# Patient Record
Sex: Female | Born: 1940 | Race: White | Hispanic: No | Marital: Single | Smoking: Current every day smoker
Health system: Southern US, Community
[De-identification: ages and names within clinical notes are randomized; demographics above are authoritative.]

## PROBLEM LIST (undated history)

## (undated) DIAGNOSIS — J189 Pneumonia, unspecified organism: Secondary | ICD-10-CM

## (undated) DIAGNOSIS — D649 Anemia, unspecified: Secondary | ICD-10-CM

## (undated) DIAGNOSIS — E871 Hypo-osmolality and hyponatremia: Secondary | ICD-10-CM

## (undated) DIAGNOSIS — I509 Heart failure, unspecified: Secondary | ICD-10-CM

## (undated) DIAGNOSIS — Z87442 Personal history of urinary calculi: Secondary | ICD-10-CM

## (undated) DIAGNOSIS — I4891 Unspecified atrial fibrillation: Secondary | ICD-10-CM

## (undated) DIAGNOSIS — E876 Hypokalemia: Secondary | ICD-10-CM

## (undated) DIAGNOSIS — J9600 Acute respiratory failure, unspecified whether with hypoxia or hypercapnia: Secondary | ICD-10-CM

## (undated) DIAGNOSIS — IMO0001 Reserved for inherently not codable concepts without codable children: Secondary | ICD-10-CM

## (undated) DIAGNOSIS — I10 Essential (primary) hypertension: Secondary | ICD-10-CM

## (undated) DIAGNOSIS — J441 Chronic obstructive pulmonary disease with (acute) exacerbation: Secondary | ICD-10-CM

## (undated) HISTORY — DX: Acute respiratory failure, unspecified whether with hypoxia or hypercapnia: J96.00

## (undated) HISTORY — PX: CHOLECYSTECTOMY: SHX55

## (undated) HISTORY — PX: ABDOMINAL HYSTERECTOMY: SHX81

## (undated) HISTORY — DX: Hypo-osmolality and hyponatremia: E87.1

## (undated) HISTORY — DX: Anemia, unspecified: D64.9

## (undated) HISTORY — PX: CYST EXCISION: SHX5701

## (undated) HISTORY — DX: Pneumonia, unspecified organism: J18.9

## (undated) HISTORY — PX: BACK SURGERY: SHX140

## (undated) HISTORY — DX: Chronic obstructive pulmonary disease with (acute) exacerbation: J44.1

## (undated) HISTORY — DX: Hypokalemia: E87.6

---

## 2014-02-02 ENCOUNTER — Telehealth: Payer: Self-pay | Admitting: Critical Care Medicine

## 2014-02-02 NOTE — Telephone Encounter (Signed)
ATC Annette @ [facility]. No one at [facility] is aware of an Drinda Buttsnnette that works there and the only way to reach "scheduling dept" is through an automated system which requires you to choose from a list of options... MRI, CT, Xray, Mammography etc... Nothing stating general scheduling dept. I selected option 1 (MRI) to try and speak with someone--was transferred by MRI to Amanda's extension in scheduling. Left detailed message pertaining to why I am calling.  Asked that she get the message to the appropriate person/location or contact our office back.  Number was given on automated system at Amanda's extension---- [phone] General Scheduling, will try this number.  Number given directed me to the same list of options as above.  Will await call back from [facility].

## 2014-02-03 NOTE — Telephone Encounter (Signed)
We received d[facility]c summary at the [facility] on this pt from [facility]. [place] Carm[place], spoke with pt.  She is requesting I speak with her son, Leonette MonarchCarol Plumb, to schedule appt.  She placed him on the phone.   We have scheduled pt to see PW on Tuesday, May 26 at 9:15 am in [place].  Pt to arrive at 9 am and bring all meds with her to visit.  Son aware, was given [id number] # and [place] office location, and voiced no further questions or concerns at this time.

## 2014-02-06 ENCOUNTER — Encounter: Payer: Self-pay | Admitting: Critical Care Medicine

## 2014-02-10 ENCOUNTER — Inpatient Hospital Stay: Payer: Self-pay | Admitting: Critical Care Medicine

## 2014-09-18 DIAGNOSIS — J449 Chronic obstructive pulmonary disease, unspecified: Secondary | ICD-10-CM | POA: Diagnosis not present

## 2014-10-19 DIAGNOSIS — J449 Chronic obstructive pulmonary disease, unspecified: Secondary | ICD-10-CM | POA: Diagnosis not present

## 2014-10-21 DIAGNOSIS — I10 Essential (primary) hypertension: Secondary | ICD-10-CM | POA: Diagnosis not present

## 2014-10-21 DIAGNOSIS — J449 Chronic obstructive pulmonary disease, unspecified: Secondary | ICD-10-CM | POA: Diagnosis not present

## 2014-10-21 DIAGNOSIS — K219 Gastro-esophageal reflux disease without esophagitis: Secondary | ICD-10-CM | POA: Diagnosis not present

## 2014-10-21 DIAGNOSIS — Z90710 Acquired absence of both cervix and uterus: Secondary | ICD-10-CM | POA: Diagnosis not present

## 2014-10-21 DIAGNOSIS — J9691 Respiratory failure, unspecified with hypoxia: Secondary | ICD-10-CM | POA: Diagnosis not present

## 2014-10-21 DIAGNOSIS — F17200 Nicotine dependence, unspecified, uncomplicated: Secondary | ICD-10-CM | POA: Diagnosis not present

## 2014-10-21 DIAGNOSIS — E780 Pure hypercholesterolemia: Secondary | ICD-10-CM | POA: Diagnosis not present

## 2014-10-21 DIAGNOSIS — J90 Pleural effusion, not elsewhere classified: Secondary | ICD-10-CM | POA: Diagnosis not present

## 2014-10-21 DIAGNOSIS — Z6820 Body mass index (BMI) 20.0-20.9, adult: Secondary | ICD-10-CM | POA: Diagnosis not present

## 2014-10-21 DIAGNOSIS — M1990 Unspecified osteoarthritis, unspecified site: Secondary | ICD-10-CM | POA: Diagnosis not present

## 2014-10-21 DIAGNOSIS — A419 Sepsis, unspecified organism: Secondary | ICD-10-CM | POA: Diagnosis not present

## 2014-10-21 DIAGNOSIS — Z716 Tobacco abuse counseling: Secondary | ICD-10-CM | POA: Diagnosis not present

## 2014-10-21 DIAGNOSIS — J111 Influenza due to unidentified influenza virus with other respiratory manifestations: Secondary | ICD-10-CM | POA: Diagnosis not present

## 2014-10-21 DIAGNOSIS — R531 Weakness: Secondary | ICD-10-CM | POA: Diagnosis not present

## 2014-10-21 DIAGNOSIS — E1165 Type 2 diabetes mellitus with hyperglycemia: Secondary | ICD-10-CM | POA: Diagnosis not present

## 2014-10-21 DIAGNOSIS — Z9889 Other specified postprocedural states: Secondary | ICD-10-CM | POA: Diagnosis not present

## 2014-10-21 DIAGNOSIS — J9601 Acute respiratory failure with hypoxia: Secondary | ICD-10-CM | POA: Diagnosis not present

## 2014-10-21 DIAGNOSIS — I509 Heart failure, unspecified: Secondary | ICD-10-CM | POA: Diagnosis not present

## 2014-10-21 DIAGNOSIS — J45909 Unspecified asthma, uncomplicated: Secondary | ICD-10-CM | POA: Diagnosis not present

## 2014-10-21 DIAGNOSIS — R9431 Abnormal electrocardiogram [ECG] [EKG]: Secondary | ICD-10-CM | POA: Diagnosis not present

## 2014-10-21 DIAGNOSIS — Z9049 Acquired absence of other specified parts of digestive tract: Secondary | ICD-10-CM | POA: Diagnosis not present

## 2014-10-21 DIAGNOSIS — J189 Pneumonia, unspecified organism: Secondary | ICD-10-CM | POA: Diagnosis not present

## 2014-11-04 DIAGNOSIS — Z9981 Dependence on supplemental oxygen: Secondary | ICD-10-CM | POA: Diagnosis not present

## 2014-11-04 DIAGNOSIS — J111 Influenza due to unidentified influenza virus with other respiratory manifestations: Secondary | ICD-10-CM | POA: Diagnosis not present

## 2014-11-04 DIAGNOSIS — J449 Chronic obstructive pulmonary disease, unspecified: Secondary | ICD-10-CM | POA: Diagnosis not present

## 2014-11-04 DIAGNOSIS — J189 Pneumonia, unspecified organism: Secondary | ICD-10-CM | POA: Diagnosis not present

## 2014-11-04 DIAGNOSIS — F17200 Nicotine dependence, unspecified, uncomplicated: Secondary | ICD-10-CM | POA: Diagnosis not present

## 2014-11-17 DIAGNOSIS — J449 Chronic obstructive pulmonary disease, unspecified: Secondary | ICD-10-CM | POA: Diagnosis not present

## 2014-12-16 DIAGNOSIS — L03116 Cellulitis of left lower limb: Secondary | ICD-10-CM | POA: Diagnosis not present

## 2014-12-16 DIAGNOSIS — M79673 Pain in unspecified foot: Secondary | ICD-10-CM | POA: Diagnosis not present

## 2014-12-16 DIAGNOSIS — J302 Other seasonal allergic rhinitis: Secondary | ICD-10-CM | POA: Diagnosis not present

## 2014-12-16 DIAGNOSIS — M7989 Other specified soft tissue disorders: Secondary | ICD-10-CM | POA: Diagnosis not present

## 2014-12-16 DIAGNOSIS — Z6822 Body mass index (BMI) 22.0-22.9, adult: Secondary | ICD-10-CM | POA: Diagnosis not present

## 2014-12-18 DIAGNOSIS — J449 Chronic obstructive pulmonary disease, unspecified: Secondary | ICD-10-CM | POA: Diagnosis not present

## 2014-12-23 DIAGNOSIS — E11621 Type 2 diabetes mellitus with foot ulcer: Secondary | ICD-10-CM | POA: Diagnosis not present

## 2014-12-23 DIAGNOSIS — L97509 Non-pressure chronic ulcer of other part of unspecified foot with unspecified severity: Secondary | ICD-10-CM | POA: Diagnosis not present

## 2014-12-23 DIAGNOSIS — E1165 Type 2 diabetes mellitus with hyperglycemia: Secondary | ICD-10-CM | POA: Diagnosis not present

## 2014-12-23 DIAGNOSIS — L03116 Cellulitis of left lower limb: Secondary | ICD-10-CM | POA: Diagnosis not present

## 2014-12-24 DIAGNOSIS — L97521 Non-pressure chronic ulcer of other part of left foot limited to breakdown of skin: Secondary | ICD-10-CM | POA: Diagnosis not present

## 2014-12-24 DIAGNOSIS — J449 Chronic obstructive pulmonary disease, unspecified: Secondary | ICD-10-CM | POA: Diagnosis not present

## 2014-12-24 DIAGNOSIS — F17210 Nicotine dependence, cigarettes, uncomplicated: Secondary | ICD-10-CM | POA: Diagnosis not present

## 2014-12-24 DIAGNOSIS — L97509 Non-pressure chronic ulcer of other part of unspecified foot with unspecified severity: Secondary | ICD-10-CM | POA: Diagnosis not present

## 2014-12-24 DIAGNOSIS — I87302 Chronic venous hypertension (idiopathic) without complications of left lower extremity: Secondary | ICD-10-CM | POA: Diagnosis not present

## 2014-12-24 DIAGNOSIS — M109 Gout, unspecified: Secondary | ICD-10-CM | POA: Diagnosis not present

## 2014-12-31 DIAGNOSIS — L97521 Non-pressure chronic ulcer of other part of left foot limited to breakdown of skin: Secondary | ICD-10-CM | POA: Diagnosis not present

## 2014-12-31 DIAGNOSIS — I87302 Chronic venous hypertension (idiopathic) without complications of left lower extremity: Secondary | ICD-10-CM | POA: Diagnosis not present

## 2015-01-05 DIAGNOSIS — I739 Peripheral vascular disease, unspecified: Secondary | ICD-10-CM | POA: Diagnosis not present

## 2015-01-05 DIAGNOSIS — R739 Hyperglycemia, unspecified: Secondary | ICD-10-CM | POA: Diagnosis not present

## 2015-01-05 DIAGNOSIS — I872 Venous insufficiency (chronic) (peripheral): Secondary | ICD-10-CM | POA: Diagnosis not present

## 2015-01-05 DIAGNOSIS — I824Z1 Acute embolism and thrombosis of unspecified deep veins of right distal lower extremity: Secondary | ICD-10-CM | POA: Diagnosis not present

## 2015-01-05 DIAGNOSIS — I771 Stricture of artery: Secondary | ICD-10-CM | POA: Diagnosis not present

## 2015-01-05 DIAGNOSIS — I82432 Acute embolism and thrombosis of left popliteal vein: Secondary | ICD-10-CM | POA: Diagnosis not present

## 2015-01-05 DIAGNOSIS — L97529 Non-pressure chronic ulcer of other part of left foot with unspecified severity: Secondary | ICD-10-CM | POA: Diagnosis not present

## 2015-01-05 DIAGNOSIS — E7251 Non-ketotic hyperglycinemia: Secondary | ICD-10-CM | POA: Diagnosis not present

## 2015-01-05 DIAGNOSIS — I82401 Acute embolism and thrombosis of unspecified deep veins of right lower extremity: Secondary | ICD-10-CM | POA: Diagnosis not present

## 2015-01-05 DIAGNOSIS — I824Z3 Acute embolism and thrombosis of unspecified deep veins of distal lower extremity, bilateral: Secondary | ICD-10-CM | POA: Diagnosis not present

## 2015-01-07 DIAGNOSIS — Z7901 Long term (current) use of anticoagulants: Secondary | ICD-10-CM | POA: Diagnosis not present

## 2015-01-07 DIAGNOSIS — I87312 Chronic venous hypertension (idiopathic) with ulcer of left lower extremity: Secondary | ICD-10-CM | POA: Diagnosis not present

## 2015-01-07 DIAGNOSIS — I87302 Chronic venous hypertension (idiopathic) without complications of left lower extremity: Secondary | ICD-10-CM | POA: Diagnosis not present

## 2015-01-07 DIAGNOSIS — L97521 Non-pressure chronic ulcer of other part of left foot limited to breakdown of skin: Secondary | ICD-10-CM | POA: Diagnosis not present

## 2015-01-07 DIAGNOSIS — I82409 Acute embolism and thrombosis of unspecified deep veins of unspecified lower extremity: Secondary | ICD-10-CM | POA: Diagnosis not present

## 2015-01-07 DIAGNOSIS — L97509 Non-pressure chronic ulcer of other part of unspecified foot with unspecified severity: Secondary | ICD-10-CM | POA: Diagnosis not present

## 2015-01-11 DIAGNOSIS — I82409 Acute embolism and thrombosis of unspecified deep veins of unspecified lower extremity: Secondary | ICD-10-CM | POA: Diagnosis not present

## 2015-01-11 DIAGNOSIS — S91302S Unspecified open wound, left foot, sequela: Secondary | ICD-10-CM | POA: Diagnosis not present

## 2015-01-11 DIAGNOSIS — F17200 Nicotine dependence, unspecified, uncomplicated: Secondary | ICD-10-CM | POA: Diagnosis not present

## 2015-01-11 DIAGNOSIS — Z6823 Body mass index (BMI) 23.0-23.9, adult: Secondary | ICD-10-CM | POA: Diagnosis not present

## 2015-01-12 DIAGNOSIS — I87312 Chronic venous hypertension (idiopathic) with ulcer of left lower extremity: Secondary | ICD-10-CM | POA: Diagnosis not present

## 2015-01-12 DIAGNOSIS — L97521 Non-pressure chronic ulcer of other part of left foot limited to breakdown of skin: Secondary | ICD-10-CM | POA: Diagnosis not present

## 2015-01-12 DIAGNOSIS — I87302 Chronic venous hypertension (idiopathic) without complications of left lower extremity: Secondary | ICD-10-CM | POA: Diagnosis not present

## 2015-01-17 DIAGNOSIS — J449 Chronic obstructive pulmonary disease, unspecified: Secondary | ICD-10-CM | POA: Diagnosis not present

## 2015-01-18 DIAGNOSIS — I82409 Acute embolism and thrombosis of unspecified deep veins of unspecified lower extremity: Secondary | ICD-10-CM | POA: Diagnosis not present

## 2015-01-19 DIAGNOSIS — L97521 Non-pressure chronic ulcer of other part of left foot limited to breakdown of skin: Secondary | ICD-10-CM | POA: Diagnosis not present

## 2015-01-19 DIAGNOSIS — I87312 Chronic venous hypertension (idiopathic) with ulcer of left lower extremity: Secondary | ICD-10-CM | POA: Diagnosis not present

## 2015-01-19 DIAGNOSIS — I87302 Chronic venous hypertension (idiopathic) without complications of left lower extremity: Secondary | ICD-10-CM | POA: Diagnosis not present

## 2015-01-26 DIAGNOSIS — Z09 Encounter for follow-up examination after completed treatment for conditions other than malignant neoplasm: Secondary | ICD-10-CM | POA: Diagnosis not present

## 2015-01-26 DIAGNOSIS — Z872 Personal history of diseases of the skin and subcutaneous tissue: Secondary | ICD-10-CM | POA: Diagnosis not present

## 2015-01-26 DIAGNOSIS — I87302 Chronic venous hypertension (idiopathic) without complications of left lower extremity: Secondary | ICD-10-CM | POA: Diagnosis not present

## 2015-02-04 DIAGNOSIS — I82409 Acute embolism and thrombosis of unspecified deep veins of unspecified lower extremity: Secondary | ICD-10-CM | POA: Diagnosis not present

## 2015-02-04 DIAGNOSIS — R5383 Other fatigue: Secondary | ICD-10-CM | POA: Diagnosis not present

## 2015-02-04 DIAGNOSIS — Z6823 Body mass index (BMI) 23.0-23.9, adult: Secondary | ICD-10-CM | POA: Diagnosis not present

## 2015-02-10 DIAGNOSIS — D72829 Elevated white blood cell count, unspecified: Secondary | ICD-10-CM | POA: Diagnosis not present

## 2015-02-10 DIAGNOSIS — J449 Chronic obstructive pulmonary disease, unspecified: Secondary | ICD-10-CM | POA: Diagnosis not present

## 2015-02-10 DIAGNOSIS — I82409 Acute embolism and thrombosis of unspecified deep veins of unspecified lower extremity: Secondary | ICD-10-CM | POA: Diagnosis not present

## 2015-02-10 DIAGNOSIS — R05 Cough: Secondary | ICD-10-CM | POA: Diagnosis not present

## 2015-02-10 DIAGNOSIS — Z6823 Body mass index (BMI) 23.0-23.9, adult: Secondary | ICD-10-CM | POA: Diagnosis not present

## 2015-02-10 DIAGNOSIS — R0602 Shortness of breath: Secondary | ICD-10-CM | POA: Diagnosis not present

## 2015-02-17 DIAGNOSIS — I82409 Acute embolism and thrombosis of unspecified deep veins of unspecified lower extremity: Secondary | ICD-10-CM | POA: Diagnosis not present

## 2015-02-17 DIAGNOSIS — J449 Chronic obstructive pulmonary disease, unspecified: Secondary | ICD-10-CM | POA: Diagnosis not present

## 2015-02-24 DIAGNOSIS — I82409 Acute embolism and thrombosis of unspecified deep veins of unspecified lower extremity: Secondary | ICD-10-CM | POA: Diagnosis not present

## 2015-03-10 DIAGNOSIS — I82409 Acute embolism and thrombosis of unspecified deep veins of unspecified lower extremity: Secondary | ICD-10-CM | POA: Diagnosis not present

## 2015-03-19 DIAGNOSIS — J449 Chronic obstructive pulmonary disease, unspecified: Secondary | ICD-10-CM | POA: Diagnosis not present

## 2015-04-12 DIAGNOSIS — I82409 Acute embolism and thrombosis of unspecified deep veins of unspecified lower extremity: Secondary | ICD-10-CM | POA: Diagnosis not present

## 2015-04-19 DIAGNOSIS — J449 Chronic obstructive pulmonary disease, unspecified: Secondary | ICD-10-CM | POA: Diagnosis not present

## 2015-04-20 DIAGNOSIS — R7881 Bacteremia: Secondary | ICD-10-CM | POA: Diagnosis not present

## 2015-04-20 DIAGNOSIS — J449 Chronic obstructive pulmonary disease, unspecified: Secondary | ICD-10-CM | POA: Diagnosis not present

## 2015-04-20 DIAGNOSIS — R2689 Other abnormalities of gait and mobility: Secondary | ICD-10-CM | POA: Diagnosis not present

## 2015-04-20 DIAGNOSIS — M6281 Muscle weakness (generalized): Secondary | ICD-10-CM | POA: Diagnosis not present

## 2015-04-20 DIAGNOSIS — R600 Localized edema: Secondary | ICD-10-CM | POA: Diagnosis not present

## 2015-04-20 DIAGNOSIS — R05 Cough: Secondary | ICD-10-CM | POA: Diagnosis not present

## 2015-04-20 DIAGNOSIS — I82402 Acute embolism and thrombosis of unspecified deep veins of left lower extremity: Secondary | ICD-10-CM | POA: Diagnosis not present

## 2015-04-20 DIAGNOSIS — I517 Cardiomegaly: Secondary | ICD-10-CM | POA: Diagnosis not present

## 2015-04-20 DIAGNOSIS — Z66 Do not resuscitate: Secondary | ICD-10-CM | POA: Diagnosis not present

## 2015-04-20 DIAGNOSIS — R0602 Shortness of breath: Secondary | ICD-10-CM | POA: Diagnosis not present

## 2015-04-20 DIAGNOSIS — I509 Heart failure, unspecified: Secondary | ICD-10-CM | POA: Diagnosis not present

## 2015-04-20 DIAGNOSIS — J189 Pneumonia, unspecified organism: Secondary | ICD-10-CM | POA: Diagnosis not present

## 2015-04-20 DIAGNOSIS — R269 Unspecified abnormalities of gait and mobility: Secondary | ICD-10-CM | POA: Diagnosis not present

## 2015-04-20 DIAGNOSIS — I499 Cardiac arrhythmia, unspecified: Secondary | ICD-10-CM | POA: Diagnosis not present

## 2015-04-20 DIAGNOSIS — I5189 Other ill-defined heart diseases: Secondary | ICD-10-CM | POA: Diagnosis not present

## 2015-04-20 DIAGNOSIS — J9610 Chronic respiratory failure, unspecified whether with hypoxia or hypercapnia: Secondary | ICD-10-CM | POA: Diagnosis not present

## 2015-04-20 DIAGNOSIS — R000 Tachycardia, unspecified: Secondary | ICD-10-CM | POA: Diagnosis not present

## 2015-04-20 DIAGNOSIS — I493 Ventricular premature depolarization: Secondary | ICD-10-CM | POA: Diagnosis not present

## 2015-04-20 DIAGNOSIS — R069 Unspecified abnormalities of breathing: Secondary | ICD-10-CM | POA: Diagnosis not present

## 2015-04-20 DIAGNOSIS — T363X5A Adverse effect of macrolides, initial encounter: Secondary | ICD-10-CM | POA: Diagnosis not present

## 2015-04-20 DIAGNOSIS — I491 Atrial premature depolarization: Secondary | ICD-10-CM | POA: Diagnosis not present

## 2015-04-20 DIAGNOSIS — J9620 Acute and chronic respiratory failure, unspecified whether with hypoxia or hypercapnia: Secondary | ICD-10-CM | POA: Diagnosis not present

## 2015-04-20 DIAGNOSIS — Z9981 Dependence on supplemental oxygen: Secondary | ICD-10-CM | POA: Diagnosis not present

## 2015-04-20 DIAGNOSIS — J441 Chronic obstructive pulmonary disease with (acute) exacerbation: Secondary | ICD-10-CM | POA: Diagnosis not present

## 2015-04-20 DIAGNOSIS — D649 Anemia, unspecified: Secondary | ICD-10-CM | POA: Diagnosis not present

## 2015-04-20 DIAGNOSIS — R509 Fever, unspecified: Secondary | ICD-10-CM | POA: Diagnosis not present

## 2015-04-20 DIAGNOSIS — F17210 Nicotine dependence, cigarettes, uncomplicated: Secondary | ICD-10-CM | POA: Diagnosis not present

## 2015-04-20 DIAGNOSIS — J440 Chronic obstructive pulmonary disease with acute lower respiratory infection: Secondary | ICD-10-CM | POA: Diagnosis not present

## 2015-04-20 DIAGNOSIS — Z452 Encounter for adjustment and management of vascular access device: Secondary | ICD-10-CM | POA: Diagnosis not present

## 2015-04-20 DIAGNOSIS — I058 Other rheumatic mitral valve diseases: Secondary | ICD-10-CM | POA: Diagnosis not present

## 2015-04-20 DIAGNOSIS — A411 Sepsis due to other specified staphylococcus: Secondary | ICD-10-CM | POA: Diagnosis not present

## 2015-04-20 DIAGNOSIS — R2681 Unsteadiness on feet: Secondary | ICD-10-CM | POA: Diagnosis not present

## 2015-04-20 DIAGNOSIS — J158 Pneumonia due to other specified bacteria: Secondary | ICD-10-CM | POA: Diagnosis not present

## 2015-04-20 DIAGNOSIS — A419 Sepsis, unspecified organism: Secondary | ICD-10-CM | POA: Diagnosis not present

## 2015-04-20 DIAGNOSIS — Z7951 Long term (current) use of inhaled steroids: Secondary | ICD-10-CM | POA: Diagnosis not present

## 2015-04-20 DIAGNOSIS — Z86718 Personal history of other venous thrombosis and embolism: Secondary | ICD-10-CM | POA: Diagnosis not present

## 2015-04-20 DIAGNOSIS — Z7901 Long term (current) use of anticoagulants: Secondary | ICD-10-CM | POA: Diagnosis not present

## 2015-04-20 DIAGNOSIS — R791 Abnormal coagulation profile: Secondary | ICD-10-CM | POA: Diagnosis not present

## 2015-04-20 DIAGNOSIS — R1013 Epigastric pain: Secondary | ICD-10-CM | POA: Diagnosis not present

## 2015-04-20 DIAGNOSIS — I272 Other secondary pulmonary hypertension: Secondary | ICD-10-CM | POA: Diagnosis not present

## 2015-04-27 DIAGNOSIS — I509 Heart failure, unspecified: Secondary | ICD-10-CM | POA: Diagnosis not present

## 2015-04-27 DIAGNOSIS — J158 Pneumonia due to other specified bacteria: Secondary | ICD-10-CM | POA: Diagnosis not present

## 2015-04-27 DIAGNOSIS — R7881 Bacteremia: Secondary | ICD-10-CM | POA: Diagnosis not present

## 2015-04-27 DIAGNOSIS — A419 Sepsis, unspecified organism: Secondary | ICD-10-CM | POA: Diagnosis not present

## 2015-04-27 DIAGNOSIS — R269 Unspecified abnormalities of gait and mobility: Secondary | ICD-10-CM | POA: Diagnosis not present

## 2015-04-27 DIAGNOSIS — J9620 Acute and chronic respiratory failure, unspecified whether with hypoxia or hypercapnia: Secondary | ICD-10-CM | POA: Diagnosis not present

## 2015-04-27 DIAGNOSIS — Z7901 Long term (current) use of anticoagulants: Secondary | ICD-10-CM | POA: Diagnosis not present

## 2015-04-27 DIAGNOSIS — R2689 Other abnormalities of gait and mobility: Secondary | ICD-10-CM | POA: Diagnosis not present

## 2015-04-27 DIAGNOSIS — J189 Pneumonia, unspecified organism: Secondary | ICD-10-CM | POA: Diagnosis not present

## 2015-04-27 DIAGNOSIS — M6281 Muscle weakness (generalized): Secondary | ICD-10-CM | POA: Diagnosis not present

## 2015-04-27 DIAGNOSIS — R2681 Unsteadiness on feet: Secondary | ICD-10-CM | POA: Diagnosis not present

## 2015-04-27 DIAGNOSIS — J449 Chronic obstructive pulmonary disease, unspecified: Secondary | ICD-10-CM | POA: Diagnosis not present

## 2015-04-27 DIAGNOSIS — J9610 Chronic respiratory failure, unspecified whether with hypoxia or hypercapnia: Secondary | ICD-10-CM | POA: Diagnosis not present

## 2015-04-27 DIAGNOSIS — J441 Chronic obstructive pulmonary disease with (acute) exacerbation: Secondary | ICD-10-CM | POA: Diagnosis not present

## 2015-05-11 DIAGNOSIS — J189 Pneumonia, unspecified organism: Secondary | ICD-10-CM | POA: Diagnosis not present

## 2015-05-11 DIAGNOSIS — J449 Chronic obstructive pulmonary disease, unspecified: Secondary | ICD-10-CM | POA: Diagnosis not present

## 2015-05-18 DIAGNOSIS — J9610 Chronic respiratory failure, unspecified whether with hypoxia or hypercapnia: Secondary | ICD-10-CM | POA: Diagnosis not present

## 2015-05-18 DIAGNOSIS — J449 Chronic obstructive pulmonary disease, unspecified: Secondary | ICD-10-CM | POA: Diagnosis not present

## 2015-05-18 DIAGNOSIS — M6281 Muscle weakness (generalized): Secondary | ICD-10-CM | POA: Diagnosis not present

## 2015-05-20 DIAGNOSIS — J449 Chronic obstructive pulmonary disease, unspecified: Secondary | ICD-10-CM | POA: Diagnosis not present

## 2015-05-22 DIAGNOSIS — J9610 Chronic respiratory failure, unspecified whether with hypoxia or hypercapnia: Secondary | ICD-10-CM | POA: Diagnosis not present

## 2015-05-22 DIAGNOSIS — J449 Chronic obstructive pulmonary disease, unspecified: Secondary | ICD-10-CM | POA: Diagnosis not present

## 2015-05-22 DIAGNOSIS — M6281 Muscle weakness (generalized): Secondary | ICD-10-CM | POA: Diagnosis not present

## 2015-05-25 DIAGNOSIS — M6281 Muscle weakness (generalized): Secondary | ICD-10-CM | POA: Diagnosis not present

## 2015-05-25 DIAGNOSIS — J9610 Chronic respiratory failure, unspecified whether with hypoxia or hypercapnia: Secondary | ICD-10-CM | POA: Diagnosis not present

## 2015-05-25 DIAGNOSIS — J449 Chronic obstructive pulmonary disease, unspecified: Secondary | ICD-10-CM | POA: Diagnosis not present

## 2015-05-28 DIAGNOSIS — J9610 Chronic respiratory failure, unspecified whether with hypoxia or hypercapnia: Secondary | ICD-10-CM | POA: Diagnosis not present

## 2015-05-28 DIAGNOSIS — J449 Chronic obstructive pulmonary disease, unspecified: Secondary | ICD-10-CM | POA: Diagnosis not present

## 2015-05-28 DIAGNOSIS — M6281 Muscle weakness (generalized): Secondary | ICD-10-CM | POA: Diagnosis not present

## 2015-06-01 DIAGNOSIS — R002 Palpitations: Secondary | ICD-10-CM | POA: Diagnosis not present

## 2015-06-01 DIAGNOSIS — I82409 Acute embolism and thrombosis of unspecified deep veins of unspecified lower extremity: Secondary | ICD-10-CM | POA: Diagnosis not present

## 2015-06-01 DIAGNOSIS — J189 Pneumonia, unspecified organism: Secondary | ICD-10-CM | POA: Diagnosis not present

## 2015-06-01 DIAGNOSIS — A412 Sepsis due to unspecified staphylococcus: Secondary | ICD-10-CM | POA: Diagnosis not present

## 2015-06-02 DIAGNOSIS — R002 Palpitations: Secondary | ICD-10-CM | POA: Diagnosis not present

## 2015-06-02 DIAGNOSIS — F17210 Nicotine dependence, cigarettes, uncomplicated: Secondary | ICD-10-CM | POA: Diagnosis not present

## 2015-06-02 DIAGNOSIS — Z7901 Long term (current) use of anticoagulants: Secondary | ICD-10-CM | POA: Diagnosis not present

## 2015-06-02 DIAGNOSIS — I5031 Acute diastolic (congestive) heart failure: Secondary | ICD-10-CM | POA: Diagnosis not present

## 2015-06-02 DIAGNOSIS — Z9981 Dependence on supplemental oxygen: Secondary | ICD-10-CM | POA: Diagnosis not present

## 2015-06-02 DIAGNOSIS — J45909 Unspecified asthma, uncomplicated: Secondary | ICD-10-CM | POA: Diagnosis not present

## 2015-06-02 DIAGNOSIS — R0602 Shortness of breath: Secondary | ICD-10-CM | POA: Diagnosis not present

## 2015-06-02 DIAGNOSIS — E780 Pure hypercholesterolemia: Secondary | ICD-10-CM | POA: Diagnosis not present

## 2015-06-02 DIAGNOSIS — I10 Essential (primary) hypertension: Secondary | ICD-10-CM | POA: Diagnosis not present

## 2015-06-02 DIAGNOSIS — E0590 Thyrotoxicosis, unspecified without thyrotoxic crisis or storm: Secondary | ICD-10-CM | POA: Diagnosis not present

## 2015-06-02 DIAGNOSIS — Z6822 Body mass index (BMI) 22.0-22.9, adult: Secondary | ICD-10-CM | POA: Diagnosis not present

## 2015-06-02 DIAGNOSIS — Z86718 Personal history of other venous thrombosis and embolism: Secondary | ICD-10-CM | POA: Diagnosis not present

## 2015-06-02 DIAGNOSIS — Z79899 Other long term (current) drug therapy: Secondary | ICD-10-CM | POA: Diagnosis not present

## 2015-06-02 DIAGNOSIS — R0609 Other forms of dyspnea: Secondary | ICD-10-CM | POA: Diagnosis not present

## 2015-06-02 DIAGNOSIS — I082 Rheumatic disorders of both aortic and tricuspid valves: Secondary | ICD-10-CM | POA: Diagnosis not present

## 2015-06-02 DIAGNOSIS — J449 Chronic obstructive pulmonary disease, unspecified: Secondary | ICD-10-CM | POA: Diagnosis not present

## 2015-06-02 DIAGNOSIS — Z23 Encounter for immunization: Secondary | ICD-10-CM | POA: Diagnosis not present

## 2015-06-02 DIAGNOSIS — R000 Tachycardia, unspecified: Secondary | ICD-10-CM | POA: Diagnosis not present

## 2015-06-02 DIAGNOSIS — I4892 Unspecified atrial flutter: Secondary | ICD-10-CM | POA: Diagnosis not present

## 2015-06-02 DIAGNOSIS — K219 Gastro-esophageal reflux disease without esophagitis: Secondary | ICD-10-CM | POA: Diagnosis not present

## 2015-06-02 DIAGNOSIS — J441 Chronic obstructive pulmonary disease with (acute) exacerbation: Secondary | ICD-10-CM | POA: Diagnosis not present

## 2015-06-02 DIAGNOSIS — E039 Hypothyroidism, unspecified: Secondary | ICD-10-CM | POA: Diagnosis not present

## 2015-06-09 DIAGNOSIS — Z79899 Other long term (current) drug therapy: Secondary | ICD-10-CM | POA: Diagnosis not present

## 2015-06-09 DIAGNOSIS — I5031 Acute diastolic (congestive) heart failure: Secondary | ICD-10-CM | POA: Diagnosis not present

## 2015-06-09 DIAGNOSIS — I4892 Unspecified atrial flutter: Secondary | ICD-10-CM | POA: Diagnosis not present

## 2015-06-09 DIAGNOSIS — K219 Gastro-esophageal reflux disease without esophagitis: Secondary | ICD-10-CM | POA: Diagnosis not present

## 2015-06-09 DIAGNOSIS — Z86718 Personal history of other venous thrombosis and embolism: Secondary | ICD-10-CM | POA: Diagnosis not present

## 2015-06-09 DIAGNOSIS — Z23 Encounter for immunization: Secondary | ICD-10-CM | POA: Diagnosis not present

## 2015-06-09 DIAGNOSIS — E0590 Thyrotoxicosis, unspecified without thyrotoxic crisis or storm: Secondary | ICD-10-CM | POA: Diagnosis not present

## 2015-06-09 DIAGNOSIS — E780 Pure hypercholesterolemia: Secondary | ICD-10-CM | POA: Diagnosis not present

## 2015-06-09 DIAGNOSIS — I10 Essential (primary) hypertension: Secondary | ICD-10-CM | POA: Diagnosis not present

## 2015-06-09 DIAGNOSIS — J45909 Unspecified asthma, uncomplicated: Secondary | ICD-10-CM | POA: Diagnosis not present

## 2015-06-09 DIAGNOSIS — Z7901 Long term (current) use of anticoagulants: Secondary | ICD-10-CM | POA: Diagnosis not present

## 2015-06-09 DIAGNOSIS — F17210 Nicotine dependence, cigarettes, uncomplicated: Secondary | ICD-10-CM | POA: Diagnosis not present

## 2015-06-09 DIAGNOSIS — Z9981 Dependence on supplemental oxygen: Secondary | ICD-10-CM | POA: Diagnosis not present

## 2015-06-09 DIAGNOSIS — J441 Chronic obstructive pulmonary disease with (acute) exacerbation: Secondary | ICD-10-CM | POA: Diagnosis not present

## 2015-06-10 DIAGNOSIS — J449 Chronic obstructive pulmonary disease, unspecified: Secondary | ICD-10-CM | POA: Diagnosis not present

## 2015-06-10 DIAGNOSIS — J9610 Chronic respiratory failure, unspecified whether with hypoxia or hypercapnia: Secondary | ICD-10-CM | POA: Diagnosis not present

## 2015-06-10 DIAGNOSIS — M6281 Muscle weakness (generalized): Secondary | ICD-10-CM | POA: Diagnosis not present

## 2015-06-16 DIAGNOSIS — I10 Essential (primary) hypertension: Secondary | ICD-10-CM | POA: Diagnosis not present

## 2015-06-16 DIAGNOSIS — Z6822 Body mass index (BMI) 22.0-22.9, adult: Secondary | ICD-10-CM | POA: Diagnosis not present

## 2015-06-16 DIAGNOSIS — I480 Paroxysmal atrial fibrillation: Secondary | ICD-10-CM | POA: Diagnosis not present

## 2015-06-16 DIAGNOSIS — R7881 Bacteremia: Secondary | ICD-10-CM | POA: Diagnosis not present

## 2015-06-16 DIAGNOSIS — I739 Peripheral vascular disease, unspecified: Secondary | ICD-10-CM | POA: Diagnosis not present

## 2015-06-16 DIAGNOSIS — K219 Gastro-esophageal reflux disease without esophagitis: Secondary | ICD-10-CM | POA: Diagnosis not present

## 2015-06-16 DIAGNOSIS — F329 Major depressive disorder, single episode, unspecified: Secondary | ICD-10-CM | POA: Diagnosis not present

## 2015-06-16 DIAGNOSIS — R0602 Shortness of breath: Secondary | ICD-10-CM | POA: Diagnosis not present

## 2015-06-16 DIAGNOSIS — E1165 Type 2 diabetes mellitus with hyperglycemia: Secondary | ICD-10-CM | POA: Diagnosis not present

## 2015-06-16 DIAGNOSIS — D72829 Elevated white blood cell count, unspecified: Secondary | ICD-10-CM | POA: Diagnosis not present

## 2015-06-16 DIAGNOSIS — Z7901 Long term (current) use of anticoagulants: Secondary | ICD-10-CM | POA: Diagnosis not present

## 2015-06-16 DIAGNOSIS — J9620 Acute and chronic respiratory failure, unspecified whether with hypoxia or hypercapnia: Secondary | ICD-10-CM | POA: Diagnosis not present

## 2015-06-16 DIAGNOSIS — R791 Abnormal coagulation profile: Secondary | ICD-10-CM | POA: Diagnosis not present

## 2015-06-16 DIAGNOSIS — B965 Pseudomonas (aeruginosa) (mallei) (pseudomallei) as the cause of diseases classified elsewhere: Secondary | ICD-10-CM | POA: Diagnosis not present

## 2015-06-16 DIAGNOSIS — J9811 Atelectasis: Secondary | ICD-10-CM | POA: Diagnosis not present

## 2015-06-16 DIAGNOSIS — J45909 Unspecified asthma, uncomplicated: Secondary | ICD-10-CM | POA: Diagnosis not present

## 2015-06-16 DIAGNOSIS — E7800 Pure hypercholesterolemia, unspecified: Secondary | ICD-10-CM | POA: Diagnosis not present

## 2015-06-16 DIAGNOSIS — Z79899 Other long term (current) drug therapy: Secondary | ICD-10-CM | POA: Diagnosis not present

## 2015-06-16 DIAGNOSIS — I5032 Chronic diastolic (congestive) heart failure: Secondary | ICD-10-CM | POA: Diagnosis not present

## 2015-06-16 DIAGNOSIS — J80 Acute respiratory distress syndrome: Secondary | ICD-10-CM | POA: Diagnosis not present

## 2015-06-16 DIAGNOSIS — I70203 Unspecified atherosclerosis of native arteries of extremities, bilateral legs: Secondary | ICD-10-CM | POA: Diagnosis not present

## 2015-06-16 DIAGNOSIS — R069 Unspecified abnormalities of breathing: Secondary | ICD-10-CM | POA: Diagnosis not present

## 2015-06-16 DIAGNOSIS — M1990 Unspecified osteoarthritis, unspecified site: Secondary | ICD-10-CM | POA: Diagnosis not present

## 2015-06-16 DIAGNOSIS — J441 Chronic obstructive pulmonary disease with (acute) exacerbation: Secondary | ICD-10-CM | POA: Diagnosis not present

## 2015-06-16 DIAGNOSIS — J449 Chronic obstructive pulmonary disease, unspecified: Secondary | ICD-10-CM | POA: Diagnosis not present

## 2015-06-16 DIAGNOSIS — E872 Acidosis: Secondary | ICD-10-CM | POA: Diagnosis not present

## 2015-06-16 DIAGNOSIS — Z9981 Dependence on supplemental oxygen: Secondary | ICD-10-CM | POA: Diagnosis not present

## 2015-06-16 DIAGNOSIS — I4891 Unspecified atrial fibrillation: Secondary | ICD-10-CM | POA: Diagnosis not present

## 2015-06-16 DIAGNOSIS — D689 Coagulation defect, unspecified: Secondary | ICD-10-CM | POA: Diagnosis not present

## 2015-06-16 DIAGNOSIS — I4892 Unspecified atrial flutter: Secondary | ICD-10-CM | POA: Diagnosis not present

## 2015-06-16 DIAGNOSIS — F17210 Nicotine dependence, cigarettes, uncomplicated: Secondary | ICD-10-CM | POA: Diagnosis not present

## 2015-06-16 DIAGNOSIS — Z86718 Personal history of other venous thrombosis and embolism: Secondary | ICD-10-CM | POA: Diagnosis not present

## 2015-06-25 DIAGNOSIS — D72829 Elevated white blood cell count, unspecified: Secondary | ICD-10-CM | POA: Diagnosis not present

## 2015-06-25 DIAGNOSIS — E872 Acidosis: Secondary | ICD-10-CM | POA: Diagnosis not present

## 2015-06-25 DIAGNOSIS — I5032 Chronic diastolic (congestive) heart failure: Secondary | ICD-10-CM | POA: Diagnosis not present

## 2015-06-25 DIAGNOSIS — I10 Essential (primary) hypertension: Secondary | ICD-10-CM | POA: Diagnosis not present

## 2015-06-25 DIAGNOSIS — J80 Acute respiratory distress syndrome: Secondary | ICD-10-CM | POA: Diagnosis not present

## 2015-06-25 DIAGNOSIS — R262 Difficulty in walking, not elsewhere classified: Secondary | ICD-10-CM | POA: Diagnosis not present

## 2015-06-25 DIAGNOSIS — J9620 Acute and chronic respiratory failure, unspecified whether with hypoxia or hypercapnia: Secondary | ICD-10-CM | POA: Diagnosis not present

## 2015-06-25 DIAGNOSIS — R791 Abnormal coagulation profile: Secondary | ICD-10-CM | POA: Diagnosis not present

## 2015-06-25 DIAGNOSIS — J9811 Atelectasis: Secondary | ICD-10-CM | POA: Diagnosis not present

## 2015-06-25 DIAGNOSIS — I4891 Unspecified atrial fibrillation: Secondary | ICD-10-CM | POA: Diagnosis not present

## 2015-06-25 DIAGNOSIS — J441 Chronic obstructive pulmonary disease with (acute) exacerbation: Secondary | ICD-10-CM | POA: Diagnosis not present

## 2015-06-25 DIAGNOSIS — R7881 Bacteremia: Secondary | ICD-10-CM | POA: Diagnosis not present

## 2015-06-28 DIAGNOSIS — J441 Chronic obstructive pulmonary disease with (acute) exacerbation: Secondary | ICD-10-CM | POA: Diagnosis not present

## 2015-06-28 DIAGNOSIS — J9620 Acute and chronic respiratory failure, unspecified whether with hypoxia or hypercapnia: Secondary | ICD-10-CM | POA: Diagnosis not present

## 2015-06-28 DIAGNOSIS — I4891 Unspecified atrial fibrillation: Secondary | ICD-10-CM | POA: Diagnosis not present

## 2015-06-28 DIAGNOSIS — R262 Difficulty in walking, not elsewhere classified: Secondary | ICD-10-CM | POA: Diagnosis not present

## 2015-07-06 DIAGNOSIS — J9610 Chronic respiratory failure, unspecified whether with hypoxia or hypercapnia: Secondary | ICD-10-CM | POA: Diagnosis not present

## 2015-07-06 DIAGNOSIS — J449 Chronic obstructive pulmonary disease, unspecified: Secondary | ICD-10-CM | POA: Diagnosis not present

## 2015-07-06 DIAGNOSIS — M6281 Muscle weakness (generalized): Secondary | ICD-10-CM | POA: Diagnosis not present

## 2015-07-07 DIAGNOSIS — J449 Chronic obstructive pulmonary disease, unspecified: Secondary | ICD-10-CM | POA: Diagnosis not present

## 2015-07-07 DIAGNOSIS — M6281 Muscle weakness (generalized): Secondary | ICD-10-CM | POA: Diagnosis not present

## 2015-07-07 DIAGNOSIS — J9610 Chronic respiratory failure, unspecified whether with hypoxia or hypercapnia: Secondary | ICD-10-CM | POA: Diagnosis not present

## 2015-07-08 DIAGNOSIS — R069 Unspecified abnormalities of breathing: Secondary | ICD-10-CM | POA: Diagnosis not present

## 2015-07-08 DIAGNOSIS — J45909 Unspecified asthma, uncomplicated: Secondary | ICD-10-CM | POA: Diagnosis not present

## 2015-07-08 DIAGNOSIS — I4891 Unspecified atrial fibrillation: Secondary | ICD-10-CM | POA: Diagnosis not present

## 2015-07-08 DIAGNOSIS — J449 Chronic obstructive pulmonary disease, unspecified: Secondary | ICD-10-CM | POA: Diagnosis not present

## 2015-07-08 DIAGNOSIS — Z7901 Long term (current) use of anticoagulants: Secondary | ICD-10-CM | POA: Diagnosis not present

## 2015-07-08 DIAGNOSIS — Z79899 Other long term (current) drug therapy: Secondary | ICD-10-CM | POA: Diagnosis not present

## 2015-07-08 DIAGNOSIS — R112 Nausea with vomiting, unspecified: Secondary | ICD-10-CM | POA: Diagnosis not present

## 2015-07-08 DIAGNOSIS — R1033 Periumbilical pain: Secondary | ICD-10-CM | POA: Diagnosis not present

## 2015-07-08 DIAGNOSIS — K219 Gastro-esophageal reflux disease without esophagitis: Secondary | ICD-10-CM | POA: Diagnosis not present

## 2015-07-08 DIAGNOSIS — R110 Nausea: Secondary | ICD-10-CM | POA: Diagnosis not present

## 2015-07-08 DIAGNOSIS — R0602 Shortness of breath: Secondary | ICD-10-CM | POA: Diagnosis not present

## 2015-07-08 DIAGNOSIS — R109 Unspecified abdominal pain: Secondary | ICD-10-CM | POA: Diagnosis not present

## 2015-07-08 DIAGNOSIS — I509 Heart failure, unspecified: Secondary | ICD-10-CM | POA: Diagnosis not present

## 2015-07-08 DIAGNOSIS — R1013 Epigastric pain: Secondary | ICD-10-CM | POA: Diagnosis not present

## 2015-07-08 DIAGNOSIS — E7800 Pure hypercholesterolemia, unspecified: Secondary | ICD-10-CM | POA: Diagnosis not present

## 2015-07-08 DIAGNOSIS — A419 Sepsis, unspecified organism: Secondary | ICD-10-CM | POA: Diagnosis not present

## 2015-07-08 DIAGNOSIS — E860 Dehydration: Secondary | ICD-10-CM | POA: Diagnosis not present

## 2015-07-08 DIAGNOSIS — I10 Essential (primary) hypertension: Secondary | ICD-10-CM | POA: Diagnosis not present

## 2015-07-09 DIAGNOSIS — J9610 Chronic respiratory failure, unspecified whether with hypoxia or hypercapnia: Secondary | ICD-10-CM | POA: Diagnosis not present

## 2015-07-09 DIAGNOSIS — J449 Chronic obstructive pulmonary disease, unspecified: Secondary | ICD-10-CM | POA: Diagnosis not present

## 2015-07-09 DIAGNOSIS — M6281 Muscle weakness (generalized): Secondary | ICD-10-CM | POA: Diagnosis not present

## 2015-07-13 DIAGNOSIS — J9610 Chronic respiratory failure, unspecified whether with hypoxia or hypercapnia: Secondary | ICD-10-CM | POA: Diagnosis not present

## 2015-07-13 DIAGNOSIS — J449 Chronic obstructive pulmonary disease, unspecified: Secondary | ICD-10-CM | POA: Diagnosis not present

## 2015-07-13 DIAGNOSIS — M6281 Muscle weakness (generalized): Secondary | ICD-10-CM | POA: Diagnosis not present

## 2015-07-15 DIAGNOSIS — M6281 Muscle weakness (generalized): Secondary | ICD-10-CM | POA: Diagnosis not present

## 2015-07-15 DIAGNOSIS — J449 Chronic obstructive pulmonary disease, unspecified: Secondary | ICD-10-CM | POA: Diagnosis not present

## 2015-07-15 DIAGNOSIS — J9610 Chronic respiratory failure, unspecified whether with hypoxia or hypercapnia: Secondary | ICD-10-CM | POA: Diagnosis not present

## 2015-07-21 ENCOUNTER — Emergency Department (HOSPITAL_COMMUNITY): Payer: Medicare Other

## 2015-07-21 ENCOUNTER — Inpatient Hospital Stay (HOSPITAL_COMMUNITY)
Admission: EM | Admit: 2015-07-21 | Discharge: 2015-07-27 | DRG: 189 | Disposition: A | Discharge status: Home Health | Payer: Medicare Other | Attending: Internal Medicine | Admitting: Internal Medicine

## 2015-07-21 ENCOUNTER — Encounter (HOSPITAL_COMMUNITY): Payer: Self-pay | Admitting: *Deleted

## 2015-07-21 DIAGNOSIS — I313 Pericardial effusion (noninflammatory): Secondary | ICD-10-CM | POA: Diagnosis present

## 2015-07-21 DIAGNOSIS — R0602 Shortness of breath: Secondary | ICD-10-CM | POA: Diagnosis present

## 2015-07-21 DIAGNOSIS — Z66 Do not resuscitate: Secondary | ICD-10-CM | POA: Diagnosis present

## 2015-07-21 DIAGNOSIS — Z7952 Long term (current) use of systemic steroids: Secondary | ICD-10-CM | POA: Diagnosis not present

## 2015-07-21 DIAGNOSIS — I110 Hypertensive heart disease with heart failure: Secondary | ICD-10-CM | POA: Diagnosis present

## 2015-07-21 DIAGNOSIS — J441 Chronic obstructive pulmonary disease with (acute) exacerbation: Secondary | ICD-10-CM | POA: Diagnosis present

## 2015-07-21 DIAGNOSIS — D649 Anemia, unspecified: Secondary | ICD-10-CM | POA: Diagnosis present

## 2015-07-21 DIAGNOSIS — Z79899 Other long term (current) drug therapy: Secondary | ICD-10-CM | POA: Diagnosis not present

## 2015-07-21 DIAGNOSIS — Z7901 Long term (current) use of anticoagulants: Secondary | ICD-10-CM

## 2015-07-21 DIAGNOSIS — I5021 Acute systolic (congestive) heart failure: Secondary | ICD-10-CM | POA: Diagnosis not present

## 2015-07-21 DIAGNOSIS — Z86718 Personal history of other venous thrombosis and embolism: Secondary | ICD-10-CM | POA: Diagnosis not present

## 2015-07-21 DIAGNOSIS — J209 Acute bronchitis, unspecified: Secondary | ICD-10-CM | POA: Diagnosis present

## 2015-07-21 DIAGNOSIS — Z87891 Personal history of nicotine dependence: Secondary | ICD-10-CM

## 2015-07-21 DIAGNOSIS — I4891 Unspecified atrial fibrillation: Secondary | ICD-10-CM | POA: Diagnosis not present

## 2015-07-21 DIAGNOSIS — J189 Pneumonia, unspecified organism: Secondary | ICD-10-CM | POA: Diagnosis present

## 2015-07-21 DIAGNOSIS — I5020 Unspecified systolic (congestive) heart failure: Secondary | ICD-10-CM | POA: Diagnosis present

## 2015-07-21 DIAGNOSIS — Z23 Encounter for immunization: Secondary | ICD-10-CM | POA: Diagnosis not present

## 2015-07-21 DIAGNOSIS — J9621 Acute and chronic respiratory failure with hypoxia: Secondary | ICD-10-CM | POA: Diagnosis present

## 2015-07-21 DIAGNOSIS — I509 Heart failure, unspecified: Secondary | ICD-10-CM | POA: Diagnosis not present

## 2015-07-21 DIAGNOSIS — I482 Chronic atrial fibrillation, unspecified: Secondary | ICD-10-CM | POA: Diagnosis present

## 2015-07-21 DIAGNOSIS — I2699 Other pulmonary embolism without acute cor pulmonale: Secondary | ICD-10-CM

## 2015-07-21 DIAGNOSIS — J9811 Atelectasis: Secondary | ICD-10-CM | POA: Diagnosis present

## 2015-07-21 DIAGNOSIS — J9601 Acute respiratory failure with hypoxia: Secondary | ICD-10-CM | POA: Diagnosis not present

## 2015-07-21 HISTORY — DX: Essential (primary) hypertension: I10

## 2015-07-21 HISTORY — DX: Heart failure, unspecified: I50.9

## 2015-07-21 LAB — BASIC METABOLIC PANEL
Anion gap: 14 (ref 5–15)
BUN: 11 mg/dL (ref 6–20)
CO2: 28 mmol/L (ref 22–32)
CREATININE: 0.77 mg/dL (ref 0.44–1.00)
Calcium: 8.5 mg/dL — ABNORMAL LOW (ref 8.9–10.3)
Chloride: 98 mmol/L — ABNORMAL LOW (ref 101–111)
Glucose, Bld: 137 mg/dL — ABNORMAL HIGH (ref 65–99)
Potassium: 3.6 mmol/L (ref 3.5–5.1)
SODIUM: 140 mmol/L (ref 135–145)

## 2015-07-21 LAB — PROTIME-INR
INR: 1.82 — AB (ref 0.00–1.49)
PROTHROMBIN TIME: 21 s — AB (ref 11.6–15.2)

## 2015-07-21 LAB — CBC
HCT: 32.3 % — ABNORMAL LOW (ref 36.0–46.0)
Hemoglobin: 10.1 g/dL — ABNORMAL LOW (ref 12.0–15.0)
MCH: 26.3 pg (ref 26.0–34.0)
MCHC: 31.3 g/dL (ref 30.0–36.0)
MCV: 84.1 fL (ref 78.0–100.0)
PLATELETS: 509 10*3/uL — AB (ref 150–400)
RBC: 3.84 MIL/uL — AB (ref 3.87–5.11)
RDW: 18.2 % — AB (ref 11.5–15.5)
WBC: 16 10*3/uL — AB (ref 4.0–10.5)

## 2015-07-21 LAB — HEPATIC FUNCTION PANEL
ALBUMIN: 2.6 g/dL — AB (ref 3.5–5.0)
ALT: 18 U/L (ref 14–54)
AST: 25 U/L (ref 15–41)
Alkaline Phosphatase: 76 U/L (ref 38–126)
Total Bilirubin: 0.4 mg/dL (ref 0.3–1.2)
Total Protein: 6 g/dL — ABNORMAL LOW (ref 6.5–8.1)

## 2015-07-21 LAB — I-STAT TROPONIN, ED: Troponin i, poc: 0.01 ng/mL (ref 0.00–0.08)

## 2015-07-21 LAB — I-STAT ARTERIAL BLOOD GAS, ED
Acid-Base Excess: 11 mmol/L — ABNORMAL HIGH (ref 0.0–2.0)
BICARBONATE: 35.8 meq/L — AB (ref 20.0–24.0)
O2 SAT: 99 %
PCO2 ART: 45.3 mmHg — AB (ref 35.0–45.0)
PO2 ART: 125 mmHg — AB (ref 80.0–100.0)
TCO2: 37 mmol/L (ref 0–100)
pH, Arterial: 7.506 — ABNORMAL HIGH (ref 7.350–7.450)

## 2015-07-21 LAB — I-STAT CG4 LACTIC ACID, ED: LACTIC ACID, VENOUS: 1.46 mmol/L (ref 0.5–2.0)

## 2015-07-21 LAB — BRAIN NATRIURETIC PEPTIDE: B Natriuretic Peptide: 102 pg/mL — ABNORMAL HIGH (ref 0.0–100.0)

## 2015-07-21 MED ORDER — INFLUENZA VAC SPLIT QUAD 0.5 ML IM SUSY
0.5000 mL | PREFILLED_SYRINGE | INTRAMUSCULAR | Status: AC
  Administered 2015-07-23: 0.5 mL via INTRAMUSCULAR
  Filled 2015-07-21: qty 0.5

## 2015-07-21 MED ORDER — IPRATROPIUM-ALBUTEROL 0.5-2.5 (3) MG/3ML IN SOLN
3.0000 mL | Freq: Once | RESPIRATORY_TRACT | Status: AC
  Administered 2015-07-21: 3 mL via RESPIRATORY_TRACT
  Filled 2015-07-21: qty 3

## 2015-07-21 MED ORDER — FUROSEMIDE 10 MG/ML IJ SOLN
40.0000 mg | Freq: Once | INTRAMUSCULAR | Status: AC
  Administered 2015-07-21: 40 mg via INTRAVENOUS
  Filled 2015-07-21: qty 4

## 2015-07-21 MED ORDER — METHYLPREDNISOLONE SODIUM SUCC 125 MG IJ SOLR
125.0000 mg | Freq: Once | INTRAMUSCULAR | Status: AC
  Administered 2015-07-21: 125 mg via INTRAVENOUS
  Filled 2015-07-21: qty 2

## 2015-07-21 MED ORDER — DEXTROSE 5 % IV SOLN
500.0000 mg | Freq: Once | INTRAVENOUS | Status: AC
  Administered 2015-07-21: 500 mg via INTRAVENOUS
  Filled 2015-07-21: qty 500

## 2015-07-21 MED ORDER — ENOXAPARIN SODIUM 80 MG/0.8ML ~~LOC~~ SOLN
65.0000 mg | Freq: Once | SUBCUTANEOUS | Status: AC
  Administered 2015-07-21: 65 mg via SUBCUTANEOUS
  Filled 2015-07-21: qty 0.8

## 2015-07-21 NOTE — ED Notes (Signed)
Pt remains monitored by blood pressure, pulse ox, and 5 lead.  

## 2015-07-21 NOTE — Progress Notes (Signed)
Removed patient from bipap and placed on 4lnc. Vital signs stable. No complications. Patient tolerating well at this time. RT will continue to monitor.

## 2015-07-21 NOTE — ED Notes (Signed)
Home o2 increased to 4L from 3l. spo2 increased to 92%

## 2015-07-21 NOTE — Progress Notes (Signed)
Came to assess pt, no distress or complications noted. Pt is on 4L [place] maintaining her o2 saturations at 94%. Pt is stable at this time.

## 2015-07-21 NOTE — ED Provider Notes (Signed)
CSN: [id number]     Arrival date & time 07/21/15  1537 History   First MD Initiated Contact with Patient 07/21/15 1607     Chief Complaint  Patient presents with  . Shortness of Breath     (Consider location/radiation/quality/duration/timing/severity/associated sxs/prior Treatment) Patient is a 74 y.o. female presenting with shortness of breath. The history is provided by the patient and a relative. The history is limited by the condition of the patient.  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 Acute respiratory failure (HCC)   . Community acquired pneumonia   . COPD with acute exacerbation (HCC)   . Anemia   . Hypokalemia   . Hyponatremia   . CHF (congestive heart failure) (HCC)   . Hypertension    Past Surgical History  Procedure Laterality Date  . No past surgeries     Family History  Problem Relation Age of Onset  . Diabetes Mellitus II Mother   . CAD Mother   . CAD Brother    Social History  Substance Use Topics  . Smoking status: Former Games developer  . Smokeless tobacco: None  . Alcohol Use: No   OB History    No data available     Review of Systems  Constitutional: Positive for chills and fatigue. Negative for fever.  HENT: Negative for facial swelling.   Respiratory: Positive for cough, sputum production and shortness of breath.   Cardiovascular: Positive for chest pain, leg swelling and PND. Negative for palpitations.  Gastrointestinal: Negative for abdominal pain.  Genitourinary: Negative  for dysuria.  Musculoskeletal: Negative for back pain.  Skin: Negative for rash.  Neurological: Negative for headaches.  Psychiatric/Behavioral: Negative for confusion.      Allergies  Review of patient's allergies indicates no known allergies.  Home Medications   Prior to Admission medications   Medication Sig Start Date End Date Taking? Authorizing Provider  CARTIA XT 180 MG 24 hr capsule Take 180 mg by mouth daily. 07/03/15  Yes Historical Provider, MD  COMBIVENT RESPIMAT 20-100 MCG/ACT AERS respimat Inhale 2 puffs into the lungs every 6 (six) hours as needed. 07/19/15  Yes Historical Provider, MD  Fluticasone-Salmeterol (ADVAIR) 500-50 MCG/DOSE AEPB Inhale 1 puff into the lungs 2 (two) times daily.   Yes Historical Provider, MD  furosemide (LASIX) 40 MG tablet Take 40 mg by mouth daily.   Yes Historical Provider, MD  ipratropium-albuterol (DUONEB) 0.5-2.5 (3) MG/3ML SOLN Take 3 mLs by nebulization 4 (four) times daily.   Yes Historical Provider, MD  pantoprazole (PROTONIX) 40 MG tablet Take 40 mg by mouth daily.   Yes Historical Provider, MD  predniSONE (DELTASONE) 10 MG tablet Take 10 mg by mouth daily with breakfast.   Yes Historical Provider, MD  tiotropium (SPIRIVA) 18 MCG inhalation capsule Place 18 mcg into inhaler and inhale daily.   Yes Historical Provider, MD  warfarin (COUMADIN) 3 MG tablet Take 3 mg by mouth daily.   Yes Historical Provider, MD   BP 132/72 mmHg  Pulse 111  Resp  20  SpO2 89% Physical Exam  Constitutional: She is oriented to person, place, and time. She appears well-developed and well-nourished. She appears distressed.  HENT:  Head: Normocephalic and atraumatic.  Right Ear: External ear normal.  Left Ear: External ear normal.  Nose: Nose normal.  Mouth/Throat: Oropharynx is clear and moist. No oropharyngeal exudate.  Eyes: Conjunctivae and EOM are normal. Pupils are equal, round, and reactive to light. Right eye exhibits no discharge. Left eye  exhibits no discharge. No scleral icterus.  Neck: Normal range of motion. Neck supple. No JVD present. No tracheal deviation present. No thyromegaly present.  Cardiovascular: Intact distal pulses.  An irregularly irregular rhythm present. Tachycardia present.   Pulmonary/Chest: No stridor. Tachypnea noted. She is in respiratory distress. She has decreased breath sounds. She has no wheezes. She has no rales. She exhibits no tenderness.  Abdominal: Soft. She exhibits no distension. There is no tenderness.  Musculoskeletal: Normal range of motion. She exhibits edema. She exhibits no tenderness.  Lymphadenopathy:    She has no cervical adenopathy.  Neurological: She is alert and oriented to person, place, and time. She displays no atrophy and no tremor. She exhibits normal muscle tone. She displays no seizure activity. GCS eye subscore is 4. GCS verbal subscore is 5. GCS motor subscore is 6.  Skin: Skin is warm and dry. No rash noted. She is not diaphoretic. No erythema. No pallor.  Psychiatric: She has a normal mood and affect. Her behavior is normal. Judgment and thought content normal.  Nursing note and vitals reviewed.   ED Course  Procedures (including critical care time) Labs Review Labs Reviewed  BASIC METABOLIC PANEL - Abnormal; Notable for the following:    Chloride 98 (*)    Glucose, Bld 137 (*)    Calcium 8.5 (*)    All other components within normal limits  CBC - Abnormal; Notable for the following:    WBC 16.0 (*)    RBC 3.84 (*)    Hemoglobin 10.1 (*)    HCT 32.3 (*)    RDW 18.2 (*)    Platelets 509 (*)    All other components within normal limits  PROTIME-INR - Abnormal; Notable for the following:    Prothrombin Time 21.0 (*)    INR 1.82 (*)    All other components within normal limits  BRAIN NATRIURETIC PEPTIDE - Abnormal; Notable for the following:    B Natriuretic Peptide 102.0 (*)    All other components within normal limits  HEPATIC FUNCTION PANEL - Abnormal;  Notable for the following:    Total Protein 6.0 (*)    Albumin 2.6 (*)    Bilirubin, Direct <0.1 (*)    All other components within normal limits  D-DIMER, QUANTITATIVE (NOT AT [facility]) - Abnormal; Notable for the following:    D-Dimer, Quant 0.70 (*)    All other components within normal limits  PROTIME-INR - Abnormal; Notable for the following:    Prothrombin Time 24.1 (*)    INR 2.18 (*)    All other components within normal limits  TSH - Abnormal; Notable for the following:    TSH 0.265 (*)    All other components within normal limits  COMPREHENSIVE METABOLIC PANEL - Abnormal; Notable for the following:    Chloride 95 (*)    CO2 35 (*)    Glucose, Bld 192 (*)    Calcium 8.5 (*)    Total Protein 5.1 (*)    Albumin 2.2 (*)  All other components within normal limits  CBC WITH DIFFERENTIAL/PLATELET - Abnormal; Notable for the following:    RBC 3.41 (*)    Hemoglobin 8.7 (*)    HCT 28.4 (*)    MCH 25.5 (*)    RDW 17.7 (*)    Platelets 481 (*)    All other components within normal limits  MAGNESIUM - Abnormal; Notable for the following:    Magnesium 1.6 (*)    All other components within normal limits  BLOOD GAS, ARTERIAL - Abnormal; Notable for the following:    pH, Arterial 7.478 (*)    pO2, Arterial 61.6 (*)    Bicarbonate 32.4 (*)    Acid-Base Excess 8.5 (*)    All other components within normal limits  I-STAT ARTERIAL BLOOD GAS, ED - Abnormal; Notable for the following:    pH, Arterial 7.506 (*)    pCO2 arterial 45.3 (*)    pO2, Arterial 125.0 (*)    Bicarbonate 35.8 (*)    Acid-Base Excess 11.0 (*)    All other components within normal limits  MRSA PCR SCREENING  CULTURE, EXPECTORATED SPUTUM-ASSESSMENT  RESPIRATORY VIRUS PANEL  TROPONIN I  TROPONIN I  TROPONIN I  INFLUENZA PANEL BY PCR (TYPE A & B, H1N1)  PROTIME-INR  COMPREHENSIVE METABOLIC PANEL  CBC WITH DIFFERENTIAL/PLATELET  MAGNESIUM  I-STAT CG4 LACTIC ACID, ED  I-STAT TROPOININ, ED    Imaging  Review Ct Angio Chest Pe W/cm &/or Wo Cm  07/22/2015  CLINICAL DATA:  New diagnosis of DVT last month. Elevated D-dimer and onset of shortness of Breath. COPD exacerbation. Congestive heart failure. EXAM: CT ANGIOGRAPHY CHEST WITH CONTRAST TECHNIQUE: Multidetector CT imaging of the chest was performed using the standard protocol during bolus administration of intravenous contrast. Multiplanar CT image reconstructions and MIPs were obtained to evaluate the vascular anatomy. CONTRAST:  100mL OMNIPAQUE IOHEXOL 350 MG/ML SOLN COMPARISON:  07/21/2015 plain film.  06/02/2015 CT. FINDINGS: Mediastinum/Nodes: The quality of this exam for evaluation of pulmonary embolism is good. No evidence of pulmonary embolism. Aortic and branch vessel atherosclerosis. Mild cardiomegaly with lipomatous hypertrophy of the interatrial septum. Multivessel coronary artery atherosclerosis. Pulmonary artery enlargement, with the outflow tract measuring 3.4 cm. Prominent mediastinal nodes are not pathologic by size criteria. No hilar adenopathy. Prominent thoracic duct. Lungs/Pleura: New small bilateral pleural effusions. Moderate centrilobular emphysema. Minimal degradation secondary to motion. The left arm is also not raised above the head. Right middle lobe volume loss with subsegmental atelectasis is new since the prior CT. No central obstructive lesion. Left lower lobe subsegmental atelectasis or scar is slightly increased since the prior CT. Upper abdomen: Left hepatic lobe cyst. Normal imaged portions of the spleen, stomach, adrenal glands. Musculoskeletal: No acute osseous abnormality. Review of the MIP images confirms the above findings. IMPRESSION: 1.  No evidence of pulmonary embolism. 2. New small bilateral pleural effusions. Partial right middle lobe volume loss with atelectasis. No obstructive central lesion or cause identified. 3.  Atherosclerosis, including within the coronary arteries. 4. Pulmonary artery enlargement suggests  pulmonary arterial hypertension. 5. Centrilobular emphysema. 6. Mild degradation. Electronically Signed   By: Jeronimo GreavesKyle  Talbot M.D.   On: 07/22/2015 19:34   Dg Chest Port 1 View  07/21/2015  CLINICAL DATA:  Worsening shortness of breath for 1 week. History of COPD, CHF EXAM: PORTABLE CHEST 1 VIEW COMPARISON:  07/08/2015 FINDINGS: There is hyperinflation of the lungs compatible with COPD. There is cardiomegaly. Small left pleural effusion. Stable right perihilar bandlike density which likely  reflects atelectasis. No overt edema. No acute bony abnormality. IMPRESSION: Cardiomegaly. Stable bandlike atelectasis in the right mid lung. Small left pleural effusion. COPD. Electronically Signed   By: Charlett Nose M.D.   On: 07/21/2015 16:57   I have personally reviewed and evaluated these images and lab results as part of my medical decision-making.   EKG Interpretation   Date/Time:  Wednesday July 21 2015 22:15:55 EDT Ventricular Rate:  138 PR Interval:  160 QRS Duration: 81 QT Interval:  328 QTC Calculation: 497 R Axis:   49 Text Interpretation:  Sinus tachycardia Multiple premature complexes, vent  & supraven Low voltage, extremity leads ST depression, probably rate  related Borderline prolonged QT interval ED PHYSICIAN INTERPRETATION  AVAILABLE IN [facility] Confirmed by TEST, Record ([id number]) on  07/22/2015 7:13:01 AM      MDM   Final diagnoses:  Shortness of breath    Patient endorses a history of CHF, COPD, increased fluid in her lower extremities for the past week. Patient with shortness of breath worsening today increased O2 requirement home. Patient is to. Obvious lower extremity edema. 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 Patient endorsing worsening orthopnea. Denies fevers, chills, sputum production, or dysuria. Likely CHF exacerbation possibly superimposed COPD. No significant wheezing noted but breath sounds are diminished. We'll start BiPAP for increased work of breathing.  Chest x-ray, laboratory workup, for further evaluation.  Pt symptoms improved on BiPAP.  CXR and labs more c/w COPD exacerbation.  Increased sputum production, w/o PNA.  Pt given Solumedrol and doxycycline.  Pt able to be weaned from BiPAP, but still does not tolerate being laid flat.  Considered PE on sub therapeutic coumadin.  Pt cannot tolerate CTA PE scan at this time.  Would consider due to ABG results.  Gave lovenox empirically in the meantime.  Discussed with hospitalist.  Pt to be admitted for further management.  Patient care was discussed with my attending, Dr. Jeraldine Loots.    Gavin Pound, MD 07/23/15 6213  Gerhard Munch, MD 07/26/15 [phone]

## 2015-07-21 NOTE — ED Notes (Signed)
Pt states that she has had SOB that has been ongoing for over one week. Pt was recently discharged from [facility] and has been having worsening SOB since.

## 2015-07-22 ENCOUNTER — Inpatient Hospital Stay (HOSPITAL_COMMUNITY): Payer: Medicare Other

## 2015-07-22 ENCOUNTER — Encounter (HOSPITAL_COMMUNITY): Payer: Self-pay | Admitting: Internal Medicine

## 2015-07-22 DIAGNOSIS — I509 Heart failure, unspecified: Secondary | ICD-10-CM

## 2015-07-22 DIAGNOSIS — J441 Chronic obstructive pulmonary disease with (acute) exacerbation: Secondary | ICD-10-CM

## 2015-07-22 DIAGNOSIS — D649 Anemia, unspecified: Secondary | ICD-10-CM | POA: Diagnosis present

## 2015-07-22 DIAGNOSIS — J9621 Acute and chronic respiratory failure with hypoxia: Secondary | ICD-10-CM | POA: Diagnosis present

## 2015-07-22 DIAGNOSIS — I4891 Unspecified atrial fibrillation: Secondary | ICD-10-CM

## 2015-07-22 DIAGNOSIS — I482 Chronic atrial fibrillation, unspecified: Secondary | ICD-10-CM | POA: Diagnosis present

## 2015-07-22 DIAGNOSIS — Z86718 Personal history of other venous thrombosis and embolism: Secondary | ICD-10-CM

## 2015-07-22 DIAGNOSIS — I5021 Acute systolic (congestive) heart failure: Secondary | ICD-10-CM

## 2015-07-22 DIAGNOSIS — I82409 Acute embolism and thrombosis of unspecified deep veins of unspecified lower extremity: Secondary | ICD-10-CM

## 2015-07-22 LAB — COMPREHENSIVE METABOLIC PANEL
ALBUMIN: 2.2 g/dL — AB (ref 3.5–5.0)
ALT: 15 U/L (ref 14–54)
AST: 19 U/L (ref 15–41)
Alkaline Phosphatase: 55 U/L (ref 38–126)
Anion gap: 9 (ref 5–15)
BUN: 12 mg/dL (ref 6–20)
CHLORIDE: 95 mmol/L — AB (ref 101–111)
CO2: 35 mmol/L — AB (ref 22–32)
CREATININE: 0.76 mg/dL (ref 0.44–1.00)
Calcium: 8.5 mg/dL — ABNORMAL LOW (ref 8.9–10.3)
GFR calc Af Amer: >60 mL/min (ref >60)
GFR calc non Af Amer: >60 mL/min (ref >60)
GLUCOSE: 192 mg/dL — AB (ref 65–99)
POTASSIUM: 3.8 mmol/L (ref 3.5–5.1)
SODIUM: 139 mmol/L (ref 135–145)
Total Bilirubin: 0.3 mg/dL (ref 0.3–1.2)
Total Protein: 5.1 g/dL — ABNORMAL LOW (ref 6.5–8.1)

## 2015-07-22 LAB — TSH: TSH: 0.265 u[IU]/mL — AB (ref 0.350–4.500)

## 2015-07-22 LAB — CBC WITH DIFFERENTIAL/PLATELET
Basophils Absolute: 0 10*3/uL (ref 0.0–0.1)
Basophils Relative: 0 %
EOS ABS: 0 10*3/uL (ref 0.0–0.7)
EOS PCT: 0 %
HCT: 28.4 % — ABNORMAL LOW (ref 36.0–46.0)
HEMOGLOBIN: 8.7 g/dL — AB (ref 12.0–15.0)
LYMPHS ABS: 1.3 10*3/uL (ref 0.7–4.0)
Lymphocytes Relative: 17 %
MCH: 25.5 pg — AB (ref 26.0–34.0)
MCHC: 30.6 g/dL (ref 30.0–36.0)
MCV: 83.3 fL (ref 78.0–100.0)
MONO ABS: 0.1 10*3/uL (ref 0.1–1.0)
MONOS PCT: 2 %
Neutro Abs: 6.2 10*3/uL (ref 1.7–7.7)
Neutrophils Relative %: 81 %
PLATELETS: 481 10*3/uL — AB (ref 150–400)
RBC: 3.41 MIL/uL — ABNORMAL LOW (ref 3.87–5.11)
RDW: 17.7 % — AB (ref 11.5–15.5)
WBC: 7.7 10*3/uL (ref 4.0–10.5)

## 2015-07-22 LAB — D-DIMER, QUANTITATIVE (NOT AT ARMC): D DIMER QUANT: 0.7 ug{FEU}/mL — AB (ref 0.00–0.48)

## 2015-07-22 LAB — BLOOD GAS, ARTERIAL
Acid-Base Excess: 8.5 mmol/L — ABNORMAL HIGH (ref 0.0–2.0)
BICARBONATE: 32.4 meq/L — AB (ref 20.0–24.0)
DRAWN BY: 225631
O2 Content: 4 L/min
O2 Saturation: 90.9 %
PCO2 ART: 44.2 mmHg (ref 35.0–45.0)
PH ART: 7.478 — AB (ref 7.350–7.450)
Patient temperature: 98.6
TCO2: 33.8 mmol/L (ref 0–100)
pO2, Arterial: 61.6 mmHg — ABNORMAL LOW (ref 80.0–100.0)

## 2015-07-22 LAB — PROTIME-INR
INR: 2.18 — AB (ref 0.00–1.49)
Prothrombin Time: 24.1 seconds — ABNORMAL HIGH (ref 11.6–15.2)

## 2015-07-22 LAB — TROPONIN I
Troponin I: <0.03 ng/mL (ref <0.031)
Troponin I: <0.03 ng/mL (ref <0.031)
Troponin I: <0.03 ng/mL (ref <0.031)

## 2015-07-22 LAB — MRSA PCR SCREENING: MRSA BY PCR: NEGATIVE

## 2015-07-22 LAB — INFLUENZA PANEL BY PCR (TYPE A & B)
H1N1 flu by pcr: NOT DETECTED
INFLAPCR: NEGATIVE
Influenza B By PCR: NEGATIVE

## 2015-07-22 LAB — MAGNESIUM: MAGNESIUM: 1.6 mg/dL — AB (ref 1.7–2.4)

## 2015-07-22 MED ORDER — SODIUM CHLORIDE 0.9 % IJ SOLN
3.0000 mL | Freq: Two times a day (BID) | INTRAMUSCULAR | Status: DC
  Administered 2015-07-22 – 2015-07-26 (×9): 3 mL via INTRAVENOUS

## 2015-07-22 MED ORDER — IOHEXOL 350 MG/ML SOLN
100.0000 mL | Freq: Once | INTRAVENOUS | Status: AC | PRN
  Administered 2015-07-22: 100 mL via INTRAVENOUS

## 2015-07-22 MED ORDER — WARFARIN - PHARMACIST DOSING INPATIENT
Freq: Every day | Status: DC
  Administered 2015-07-23: 18:00:00

## 2015-07-22 MED ORDER — FUROSEMIDE 10 MG/ML IJ SOLN
40.0000 mg | Freq: Every day | INTRAMUSCULAR | Status: DC
  Administered 2015-07-22 – 2015-07-23 (×2): 40 mg via INTRAVENOUS
  Filled 2015-07-22 (×2): qty 4

## 2015-07-22 MED ORDER — LEVALBUTEROL HCL 0.63 MG/3ML IN NEBU
0.6300 mg | INHALATION_SOLUTION | Freq: Four times a day (QID) | RESPIRATORY_TRACT | Status: DC
  Administered 2015-07-22: 0.63 mg via RESPIRATORY_TRACT
  Filled 2015-07-22: qty 3

## 2015-07-22 MED ORDER — METHYLPREDNISOLONE SODIUM SUCC 40 MG IJ SOLR
40.0000 mg | Freq: Two times a day (BID) | INTRAMUSCULAR | Status: DC
  Administered 2015-07-22 – 2015-07-23 (×3): 40 mg via INTRAVENOUS
  Filled 2015-07-22 (×3): qty 1

## 2015-07-22 MED ORDER — ARFORMOTEROL TARTRATE 15 MCG/2ML IN NEBU
15.0000 ug | INHALATION_SOLUTION | Freq: Two times a day (BID) | RESPIRATORY_TRACT | Status: DC
  Administered 2015-07-22 – 2015-07-27 (×10): 15 ug via RESPIRATORY_TRACT
  Filled 2015-07-22 (×9): qty 2

## 2015-07-22 MED ORDER — WARFARIN SODIUM 3 MG PO TABS
3.0000 mg | ORAL_TABLET | Freq: Once | ORAL | Status: AC
  Administered 2015-07-22: 3 mg via ORAL
  Filled 2015-07-22: qty 1

## 2015-07-22 MED ORDER — ONDANSETRON HCL 4 MG PO TABS: 4.0000 mg | ORAL_TABLET | Freq: Four times a day (QID) | ORAL | Status: DC | PRN

## 2015-07-22 MED ORDER — PANTOPRAZOLE SODIUM 40 MG PO TBEC
40.0000 mg | DELAYED_RELEASE_TABLET | Freq: Every day | ORAL | Status: DC
  Administered 2015-07-22 – 2015-07-27 (×6): 40 mg via ORAL
  Filled 2015-07-22 (×6): qty 1

## 2015-07-22 MED ORDER — IPRATROPIUM BROMIDE 0.02 % IN SOLN
0.5000 mg | RESPIRATORY_TRACT | Status: DC
  Administered 2015-07-22 (×3): 0.5 mg via RESPIRATORY_TRACT
  Filled 2015-07-22 (×3): qty 2.5

## 2015-07-22 MED ORDER — LEVALBUTEROL HCL 0.63 MG/3ML IN NEBU
0.6300 mg | INHALATION_SOLUTION | Freq: Four times a day (QID) | RESPIRATORY_TRACT | Status: DC
  Administered 2015-07-22 (×4): 0.63 mg via RESPIRATORY_TRACT
  Filled 2015-07-22 (×4): qty 3

## 2015-07-22 MED ORDER — DILTIAZEM HCL ER COATED BEADS 180 MG PO CP24
180.0000 mg | ORAL_CAPSULE | Freq: Every day | ORAL | Status: DC
  Administered 2015-07-22 – 2015-07-27 (×6): 180 mg via ORAL
  Filled 2015-07-22 (×6): qty 1

## 2015-07-22 MED ORDER — BUDESONIDE 0.25 MG/2ML IN SUSP
0.2500 mg | Freq: Two times a day (BID) | RESPIRATORY_TRACT | Status: DC
  Administered 2015-07-22: 0.25 mg via RESPIRATORY_TRACT
  Filled 2015-07-22: qty 2

## 2015-07-22 MED ORDER — MOMETASONE FURO-FORMOTEROL FUM 200-5 MCG/ACT IN AERO
2.0000 | INHALATION_SPRAY | Freq: Two times a day (BID) | RESPIRATORY_TRACT | Status: DC
  Administered 2015-07-22: 2 via RESPIRATORY_TRACT
  Filled 2015-07-22: qty 8.8

## 2015-07-22 MED ORDER — ACETAMINOPHEN 650 MG RE SUPP
650.0000 mg | Freq: Four times a day (QID) | RECTAL | Status: DC | PRN
Start: 2015-07-22 — End: 2015-07-27

## 2015-07-22 MED ORDER — DEXTROSE 5 % IV SOLN
1.0000 g | INTRAVENOUS | Status: DC
  Administered 2015-07-22 – 2015-07-23 (×2): 1 g via INTRAVENOUS
  Filled 2015-07-22 (×3): qty 10

## 2015-07-22 MED ORDER — WARFARIN SODIUM 3 MG PO TABS: 3.0000 mg | ORAL_TABLET | Freq: Once | ORAL | Status: DC

## 2015-07-22 MED ORDER — ONDANSETRON HCL 4 MG/2ML IJ SOLN: 4.0000 mg | Freq: Four times a day (QID) | INTRAMUSCULAR | Status: DC | PRN

## 2015-07-22 MED ORDER — ACETAMINOPHEN 325 MG PO TABS
650.0000 mg | ORAL_TABLET | Freq: Four times a day (QID) | ORAL | Status: DC | PRN
Start: 2015-07-22 — End: 2015-07-27

## 2015-07-22 MED ORDER — DEXTROSE 5 % IV SOLN
500.0000 mg | INTRAVENOUS | Status: DC
  Administered 2015-07-22 – 2015-07-24 (×3): 500 mg via INTRAVENOUS
  Filled 2015-07-22 (×3): qty 500

## 2015-07-22 MED ORDER — LEVALBUTEROL HCL 0.63 MG/3ML IN NEBU: 0.6300 mg | INHALATION_SOLUTION | Freq: Four times a day (QID) | RESPIRATORY_TRACT | Status: DC | PRN

## 2015-07-22 MED ORDER — CETYLPYRIDINIUM CHLORIDE 0.05 % MT LIQD
7.0000 mL | Freq: Two times a day (BID) | OROMUCOSAL | Status: DC
  Administered 2015-07-22 – 2015-07-27 (×8): 7 mL via OROMUCOSAL

## 2015-07-22 NOTE — Progress Notes (Signed)
ANTICOAGULATION CONSULT NOTE - Initial Consult  Pharmacy Consult for warfarin Indication: atrial fibrillation  No Known Allergies  Patient Measurements: Height: 5\' 5"  (165.1 cm) Weight: 131 lb 13.4 oz (59.8 kg) IBW/kg (Calculated) : 57 Heparin Dosing Weight:   Vital Signs: Temp: 98 F (36.7 C) (11/02 2300) Temp Source: Oral (11/02 2300) BP: 108/72 mmHg (11/02 2300) Pulse Rate: 107 (11/02 2300)  Labs:  Recent Labs  07/21/15 1556 07/21/15 1606  HGB  --  10.1*  HCT  --  32.3*  PLT  --  509*  LABPROT 21.0*  --   INR 1.82*  --   CREATININE  --  0.77    Estimated Creatinine Clearance: 56.4 mL/min (by C-G formula based on Cr of 0.77).   Medical History: Past Medical History  Diagnosis Date  . Acute respiratory failure (HCC)   . Community acquired pneumonia   . COPD with acute exacerbation (HCC)   . Anemia   . Hypokalemia   . Hyponatremia   . CHF (congestive heart failure) (HCC)   . Hypertension     Medications:  Prescriptions prior to admission  Medication Sig Dispense Refill Last Dose  . CARTIA XT 180 MG 24 hr capsule Take 180 mg by mouth daily.   07/21/2015 at Unknown time  . COMBIVENT RESPIMAT 20-100 MCG/ACT AERS respimat Inhale 2 puffs into the lungs every 6 (six) hours as needed.   07/21/2015 at Unknown time  . Fluticasone-Salmeterol (ADVAIR) 500-50 MCG/DOSE AEPB Inhale 1 puff into the lungs 2 (two) times daily.   07/21/2015 at Unknown time  . furosemide (LASIX) 40 MG tablet Take 40 mg by mouth daily.   07/21/2015 at Unknown time  . ipratropium-albuterol (DUONEB) 0.5-2.5 (3) MG/3ML SOLN Take 3 mLs by nebulization 4 (four) times daily.   07/21/2015 at Unknown time  . pantoprazole (PROTONIX) 40 MG tablet Take 40 mg by mouth daily.   07/21/2015 at Unknown time  . predniSONE (DELTASONE) 10 MG tablet Take 10 mg by mouth daily with breakfast.   07/21/2015 at Unknown time  . tiotropium (SPIRIVA) 18 MCG inhalation capsule Place 18 mcg into inhaler and inhale daily.    07/21/2015 at Unknown time  . warfarin (COUMADIN) 3 MG tablet Take 3 mg by mouth daily.   07/21/2015 at Unknown time    Assessment: 74 yo female recently admitted to outside hospital, presents with SOB. On warfarin PTA for AFib. Current INR is 1.8. Last dose of warfarin was 11/2, patient also received Lovenox 1 mg/kg at 1900 on 11/2.  PTA dose: 3 mg po daily  Goal of Therapy:  INR 2-3 Monitor platelets by anticoagulation protocol: Yes   Plan:  -No additional warfarin tonight  -F/u INR in the AM  Baldemar FridayMasters, Dorin Stooksbury M  07/22/2015 12:34 AM

## 2015-07-22 NOTE — Progress Notes (Signed)
Utilization Review Completed.Redford Behrle T11/11/2014  

## 2015-07-22 NOTE — Progress Notes (Signed)
*  PRELIMINARY RESULTS* Echocardiogram 2D Echocardiogram has been performed.  Jeryl Columbialliott, Ciel Yanes 07/22/2015, 10:48 AM

## 2015-07-22 NOTE — Progress Notes (Signed)
ANTICOAGULATION CONSULT NOTE  Pharmacy Consult for warfarin Indication: atrial fibrillation  No Known Allergies  Patient Measurements: Height: 5\' 5"  (165.1 cm) Weight: 131 lb 13.4 oz (59.8 kg) IBW/kg (Calculated) : 57  Vital Signs: Temp: 97.8 F (36.6 C) (11/03 0828) Temp Source: Oral (11/03 0828) BP: 119/65 mmHg (11/03 0828) Pulse Rate: 104 (11/03 0828)  Labs:  Recent Labs  07/21/15 1556 07/21/15 1606 07/22/15 0046 07/22/15 0745  HGB  --  10.1*  --  8.7*  HCT  --  32.3*  --  28.4*  PLT  --  509*  --  481*  LABPROT 21.0*  --   --  24.1*  INR 1.82*  --   --  2.18*  CREATININE  --  0.77  --  0.76  TROPONINI  --   --  <0.03 <0.03    Estimated Creatinine Clearance: 56.4 mL/min (by C-G formula based on Cr of 0.76).   Med Assessment: 74 yo female recently admitted to outside hospital, presents with SOB and productive cough, being treated for COPD exacerbation.  On warfarin PTA for AFib. Last dose taken at home was 11/2. INR this morning therapeutic at 2.18. Patient also received Lovenox 1 mg/kg at 1900 on 11/2.  PTA dose: 3 mg po daily  Hgb 8.7, plts 481- no bleeding noted.  Goal of Therapy:  INR 2-3 Monitor platelets by anticoagulation protocol: Yes   Plan:  -warfarin 3mg  po x1 tonight at 1800 -daily INR -follow for s/s bleeding  Tekoa Hamor D. Masiya Claassen, PharmD, BCPS Clinical Pharmacist Pager: [phone] 07/22/2015 12:10 PM

## 2015-07-22 NOTE — Progress Notes (Signed)
[name] - Stepdown/ICU TEAM Progress Note  Brenda Wells ZOX:[id number] DOB: 05-06-1941 DOA: 07/21/2015 PCP: [name],[name], MD  Admit HPI / Brief Narrative: 74 y.o. WF PMHx HTN, CHF, COPD on 3 L O2 at home, Acute Respiratory Failure, Atrial Fibrillation   Brought to the ER the patient was lying position shortness of breath. Patient states her symptoms started last week and has been gradually worsening with productive cough denies any chest pain. A shows initially placed on BiPAP in the ER. Chest x-ray shows cardiomegaly with pericardial effusion and COPD findings. On exam patient's chest is tight. Patient was taken off BiPAP. Patient states she was admitted last month Brenda Wells for COPD exacerbation. Patient has quit smoking 2 months ago. Patient has been admitted for COPD exacerbation. Denies any gain in weight. Patient is also noticed to have elevated heart rate and patient has history of A. fib.  HPI/Subjective: 11/3 A/O 4, states symptoms began ~ 2 weeks ago, has had increasing SOB/DOE over the last 2 weeks to the point where unable to ambulate at all just prior to admission. States negative cough, negative fever, negative N/V   Assessment/Plan: Acute respiratory failure with hypoxia  -Most likely multifactorial to include COPD exacerbation, CHF, and PE?  -Brovana BID -Solu-Medrol 40 mg BID -Xopenex nebulizer QID -Flutter valve   COPD exacerbation/ CAP -See acute respiratory failure with hypoxia  A. fib with RVR  (chads 2 vasc score is 3.) - probably increased weight due to shortness of breath and nebulizer use.  -Limit right increasing medication.  -Cardizem 180 mg daily -Coumadin per pharmacy, INR currently therapeutic  Systolic CHF -EF slightly decreased -Strict in and out -Daily weight   DVT unspecified laterality -Coumadin per pharmacy -CT angiogram pending to rule out PE      Code Status: FULL Family Communication: no family present at time of  exam Disposition Plan: Resolution respiratory failure    Consultants:   Procedure/Significant Events: 11/3 Echocardiogram;- LVEF=50%-55%. Regional wall motion abnormalities cannot be excluded.    Culture 11/2 MRSA by PCR negative 11/3 influenza pending 11/3 respiratory virus pending 11/3 sputum pending   Antibiotics: Azithromycin 11/3 Ceftriaxone 11/3  DVT prophylaxis: Coumadin   Devices    LINES / TUBES:      Continuous Infusions:   Objective: VITAL SIGNS: Temp: 98.6 F (37 C) (11/03 1646) Temp Source: Oral (11/03 1646) BP: 124/89 mmHg (11/03 1646) Pulse Rate: 93 (11/03 1646) SPO2; FIO2:   Intake/Output Summary (Last 24 hours) at 07/22/15 2040 Last data filed at 07/22/15 1707  Gross per 24 hour  Intake   1140 ml  Output   2250 ml  Net  -1110 ml     Exam: General: A/O 4, acute on chronic respiratory distress Eyes: Negative headache, negative scleral hemorrhage ENT: Negative Runny nose,negative gingival bleeding, Neck:  Negative scars, masses, torticollis, lymphadenopathy, JVD Lungs: diffuse poor air movement in all lung fields, mild diffuse expiratory wheezing, negative crackles  Cardiovascular: Irregular irregular rhythm and rate, without murmur gallop or rub normal S1 and S2 Abdomen:negative abdominal pain, nondistended, positive soft, bowel sounds, no rebound, no ascites, no appreciable mass Extremities: No significant cyanosis, clubbing, or edema bilateral lower extremities Psychiatric:  Negative depression, negative anxiety, negative fatigue, negative mania  Neurologic:  Cranial nerves II through XII intact, tongue/uvula midline, all extremities muscle strength 5/5, sensation intact throughout, negative dysarthria, negative expressive aphasia, negative receptive aphasia.   Data Reviewed: Basic Metabolic Panel:  Recent Labs Lab 07/21/15 1606 07/22/15 0745  NA 140 139  K 3.6 3.8  CL 98* 95*  CO2 28 35*  GLUCOSE 137* 192*  BUN 11  12  CREATININE 0.77 0.76  CALCIUM 8.5* 8.5*  MG  --  1.6*   Liver Function Tests:  Recent Labs Lab 07/21/15 1556 07/22/15 0745  AST 25 19  ALT 18 15  ALKPHOS 76 55  BILITOT 0.4 0.3  PROT 6.0* 5.1*  ALBUMIN 2.6* 2.2*   No results for input(s): LIPASE, AMYLASE in the last 168 hours. No results for input(s): AMMONIA in the last 168 hours. CBC:  Recent Labs Lab 07/21/15 1606 07/22/15 0745  WBC 16.0* 7.7  NEUTROABS  --  6.2  HGB 10.1* 8.7*  HCT 32.3* 28.4*  MCV 84.1 83.3  PLT 509* 481*   Cardiac Enzymes:  Recent Labs Lab 07/22/15 0046 07/22/15 0745 07/22/15 1213  TROPONINI <0.03 <0.03 <0.03   BNP (last 3 results)  Recent Labs  07/21/15 1638  BNP 102.0*    ProBNP (last 3 results) No results for input(s): PROBNP in the last 8760 hours.  CBG: No results for input(s): GLUCAP in the last 168 hours.  Recent Results (from the past 240 hour(s))  MRSA PCR Screening     Status: None   Collection Time: 07/21/15 11:15 PM  Result Value Ref Range Status   MRSA by PCR NEGATIVE NEGATIVE Final    Comment:        The GeneXpert MRSA Assay (FDA approved for NASAL specimens only), is one component of a comprehensive MRSA colonization surveillance program. It is not intended to diagnose MRSA infection nor to guide or monitor treatment for MRSA infections.      Studies:  Recent x-ray studies have been reviewed in detail by the Attending Physician  Scheduled Meds:  Scheduled Meds: . antiseptic oral rinse  7 mL Mouth Rinse BID  . arformoterol  15 mcg Nebulization BID  . azithromycin  500 mg Intravenous Q24H  . cefTRIAXone (ROCEPHIN)  IV  1 g Intravenous Q24H  . diltiazem  180 mg Oral Daily  . furosemide  40 mg Intravenous Daily  . Influenza vac split quadrivalent PF  0.5 mL Intramuscular Tomorrow-1000  . methylPREDNISolone (SOLU-MEDROL) injection  40 mg Intravenous Q12H  . pantoprazole  40 mg Oral Daily  . sodium chloride  3 mL Intravenous Q12H  . Warfarin  - Pharmacist Dosing Inpatient   Does not apply q1800    Time spent on care of this patient: 40 mins   [name], [name] , MD  [facility] Office  [phone] Pager - [phone]  On-Call/Text Page:      [email or web address]      password [email or web address]  If 7PM-7AM, please contact night-coverage [email or web address] Password [email or web address] 07/22/2015, 8:40 PM   LOS: 1 day   Care during the described time interval was provided by me .  I have reviewed this patient's available data, including medical history, events of note, physical examination, and all test results as part of my evaluation. I have personally reviewed and interpreted all radiology studies.   Carolyne Littles, MD [phone] Pager

## 2015-07-22 NOTE — H&P (Signed)
[facility] History and Physical  Brenda Wells ZOX:[id number] DOB: Jan 14, 1941 DOA: 07/21/2015  Referring physician: Dr.Brooten. PCP: [name],[name], MD  Specialists: [facility]  Chief Complaint: Shortness of breath.  HPI: Brenda Wells is a [age] y.o. female with history of COPD, CHF, atrial fibrillation was brought to the ER the patient was lying position shortness of breath. Patient states her symptoms started last week and has been gradually worsening with productive cough denies any chest pain. A shows initially placed on BiPAP in the ER. Chest x-ray shows cardiomegaly with pericardial effusion and COPD findings. On exam patient's chest is tight. Patient was taken off BiPAP. Patient states she was admitted last [facility] for COPD exacerbation. Patient has quit smoking 2 months ago. Patient has been admitted for COPD exacerbation. Denies any gain in weight. Patient is also noticed to have elevated heart rate and patient has history of A. fib.  Review of Systems: As presented in the history of presenting illness, rest negative.  Past Medical History  Diagnosis Date  . Acute respiratory failure (HCC)   . Community acquired pneumonia   . COPD with acute exacerbation (HCC)   . Anemia   . Hypokalemia   . Hyponatremia   . CHF (congestive heart failure) (HCC)   . Hypertension    Past Surgical History  Procedure Laterality Date  . No past surgeries     Social History:  reports that she has quit smoking. She does not have any smokeless tobacco history on file. She reports that she does not drink alcohol. Her drug history is not on file. Where does patient live home. Can patient participate in ADLs? Yes.  No Known Allergies  Family History:  Family History  Problem Relation Age of Onset  . Diabetes Mellitus II Mother   . CAD Mother   . CAD Brother       Prior to Admission medications   Medication Sig Start Date End Date Taking? Authorizing  Provider  CARTIA XT 180 MG 24 hr capsule Take 180 mg by mouth daily. 07/03/15  Yes Historical Provider, MD  COMBIVENT RESPIMAT 20-100 MCG/ACT AERS respimat Inhale 2 puffs into the lungs every 6 (six) hours as needed. 07/19/15  Yes Historical Provider, MD  Fluticasone-Salmeterol (ADVAIR) 500-50 MCG/DOSE AEPB Inhale 1 puff into the lungs 2 (two) times daily.   Yes Historical Provider, MD  furosemide (LASIX) 40 MG tablet Take 40 mg by mouth daily.   Yes Historical Provider, MD  ipratropium-albuterol (DUONEB) 0.5-2.5 (3) MG/3ML SOLN Take 3 mLs by nebulization 4 (four) times daily.   Yes Historical Provider, MD  pantoprazole (PROTONIX) 40 MG tablet Take 40 mg by mouth daily.   Yes Historical Provider, MD  predniSONE (DELTASONE) 10 MG tablet Take 10 mg by mouth daily with breakfast.   Yes Historical Provider, MD  tiotropium (SPIRIVA) 18 MCG inhalation capsule Place 18 mcg into inhaler and inhale daily.   Yes Historical Provider, MD  warfarin (COUMADIN) 3 MG tablet Take 3 mg by mouth daily.   Yes Historical Provider, MD    Physical Exam: Filed Vitals:   07/21/15 2045 07/21/15 2100 07/21/15 2115 07/21/15 2300  BP: 109/64 127/70 125/56 108/72  Pulse: 61 50 127 107  Temp:    98 F (36.7 C)  TempSrc:    Oral  Resp: 31 23 32 27  Height:    5\' 5"  (1.651 m)  Weight:    59.8 kg (131 lb 13.4 oz)  SpO2: 96% 92%  92% 93%     General:  Moderately built and nourished.  Eyes: Anicteric no pallor.  ENT: No discharge from the ears eyes nose and mouth.  Neck: No mass felt.  Cardiovascular: S1-S2 heard.  Respiratory: Chest appears tight. No crepitations.  Abdomen: Soft nontender bowel sounds present.  Skin: No rash.  Musculoskeletal: No edema.  Psychiatric: Appears normal.  Neurologic: Alert awake oriented to time place and person. Moves all extremities.  Labs on Admission:  Basic Metabolic Panel:  Recent Labs Lab 07/21/15 1606  NA 140  K 3.6  CL 98*  CO2 28  GLUCOSE 137*  BUN 11   CREATININE 0.77  CALCIUM 8.5*   Liver Function Tests:  Recent Labs Lab 07/21/15 1556  AST 25  ALT 18  ALKPHOS 76  BILITOT 0.4  PROT 6.0*  ALBUMIN 2.6*   No results for input(s): LIPASE, AMYLASE in the last 168 hours. No results for input(s): AMMONIA in the last 168 hours. CBC:  Recent Labs Lab 07/21/15 1606  WBC 16.0*  HGB 10.1*  HCT 32.3*  MCV 84.1  PLT 509*   Cardiac Enzymes: No results for input(s): CKTOTAL, CKMB, CKMBINDEX, TROPONINI in the last 168 hours.  BNP (last 3 results)  Recent Labs  07/21/15 1638  BNP 102.0*    ProBNP (last 3 results) No results for input(s): PROBNP in the last 8760 hours.  CBG: No results for input(s): GLUCAP in the last 168 hours.  Radiological Exams on Admission: Dg Chest Port 1 View  07/21/2015  CLINICAL DATA:  Worsening shortness of breath for 1 week. History of COPD, CHF EXAM: PORTABLE CHEST 1 VIEW COMPARISON:  07/08/2015 FINDINGS: There is hyperinflation of the lungs compatible with COPD. There is cardiomegaly. Small left pleural effusion. Stable right perihilar bandlike density which likely reflects atelectasis. No overt edema. No acute bony abnormality. IMPRESSION: Cardiomegaly. Stable bandlike atelectasis in the right mid lung. Small left pleural effusion. COPD. Electronically Signed   By: Charlett Nose M.D.   On: 07/21/2015 16:57    EKG: Independently reviewed. A. fib with RVR.  Assessment/Plan Principal Problem:   Acute respiratory failure with hypoxia (HCC) Active Problems:   COPD exacerbation (HCC)   Atrial fibrillation with RVR (HCC)   CHF (congestive heart failure) (HCC)   History of DVT (deep vein thrombosis)   Anemia   1. Acute respiratory failure with hypoxia most likely from COPD exacerbation - patient has been placed on nebulizer Pulmicort steroids and antibiotics. Probably patient also has a common of CHF for which I have placed patient on IV Lasix. Since patient's x-ray showing pericardial effusion I  have ordered 2-D echo. Check d-dimer. 2. A. fib with RVR - probably increased weight due to shortness of breath and nebulizer use. Continue rate limiting medications. If heart rate does not improve then will need Cardizem infusion. Patient's CHF could also be from RVR. Patient's chads 2 vasc score is 3. Patient is on Coumadin. Patient was given 1 dose of Lovenox in the ER due to subtherapeutic INR. Check thyroid function tests. 3. History of CHF - unknown EF. Will check 2-D echo since x-ray shows pericardial effusion. 4. History of DVT recently diagnosed last month - on Coumadin. Patient was given 1 dose of Lovenox since INR was subtherapeutic. Coumadin per pharmacy. 5. Anemia probably chronic - no old labs to compare. Follow CBC.  I have personally reviewed patient's EKG and chest x-ray.   DVT ProphylaxisCoumadin.  Code Status: DO NOT RESUSCITATE.  Family Communication: Discussed  with patient.  Disposition Plan: Admit to inpatient.    Luken Shadowens N. [facility] Pager [phone].  If 7PM-7AM, please contact night-coverage [email or web address] Password [email or web address] 07/22/2015, 12:30 AM

## 2015-07-23 DIAGNOSIS — R0602 Shortness of breath: Secondary | ICD-10-CM | POA: Diagnosis present

## 2015-07-23 DIAGNOSIS — Z86718 Personal history of other venous thrombosis and embolism: Secondary | ICD-10-CM

## 2015-07-23 DIAGNOSIS — J9621 Acute and chronic respiratory failure with hypoxia: Secondary | ICD-10-CM | POA: Diagnosis not present

## 2015-07-23 LAB — CBC WITH DIFFERENTIAL/PLATELET
Basophils Absolute: 0 10*3/uL (ref 0.0–0.1)
Basophils Relative: 0 %
Eosinophils Absolute: 0 10*3/uL (ref 0.0–0.7)
Eosinophils Relative: 0 %
HEMATOCRIT: 27.3 % — AB (ref 36.0–46.0)
HEMOGLOBIN: 8.6 g/dL — AB (ref 12.0–15.0)
LYMPHS PCT: 9 %
Lymphs Abs: 1.2 10*3/uL (ref 0.7–4.0)
MCH: 26.2 pg (ref 26.0–34.0)
MCHC: 31.5 g/dL (ref 30.0–36.0)
MCV: 83.2 fL (ref 78.0–100.0)
MONO ABS: 0.6 10*3/uL (ref 0.1–1.0)
MONOS PCT: 5 %
NEUTROS ABS: 10.9 10*3/uL — AB (ref 1.7–7.7)
Neutrophils Relative %: 86 %
Platelets: 472 10*3/uL — ABNORMAL HIGH (ref 150–400)
RBC: 3.28 MIL/uL — ABNORMAL LOW (ref 3.87–5.11)
RDW: 17.7 % — AB (ref 11.5–15.5)
WBC: 12.7 10*3/uL — ABNORMAL HIGH (ref 4.0–10.5)

## 2015-07-23 LAB — COMPREHENSIVE METABOLIC PANEL
ALBUMIN: 2.2 g/dL — AB (ref 3.5–5.0)
ALT: 16 U/L (ref 14–54)
ANION GAP: 12 (ref 5–15)
AST: 17 U/L (ref 15–41)
Alkaline Phosphatase: 54 U/L (ref 38–126)
BUN: 17 mg/dL (ref 6–20)
CHLORIDE: 93 mmol/L — AB (ref 101–111)
CO2: 32 mmol/L (ref 22–32)
Calcium: 8.5 mg/dL — ABNORMAL LOW (ref 8.9–10.3)
Creatinine, Ser: 0.84 mg/dL (ref 0.44–1.00)
GFR calc Af Amer: >60 mL/min (ref >60)
GFR calc non Af Amer: >60 mL/min (ref >60)
GLUCOSE: 175 mg/dL — AB (ref 65–99)
POTASSIUM: 3.9 mmol/L (ref 3.5–5.1)
SODIUM: 137 mmol/L (ref 135–145)
Total Bilirubin: 0.2 mg/dL — ABNORMAL LOW (ref 0.3–1.2)
Total Protein: 5.3 g/dL — ABNORMAL LOW (ref 6.5–8.1)

## 2015-07-23 LAB — PROTIME-INR
INR: 2.1 — AB (ref 0.00–1.49)
PROTHROMBIN TIME: 23.4 s — AB (ref 11.6–15.2)

## 2015-07-23 LAB — MAGNESIUM: Magnesium: 1.8 mg/dL (ref 1.7–2.4)

## 2015-07-23 MED ORDER — METHYLPREDNISOLONE SODIUM SUCC 40 MG IJ SOLR: 40.0000 mg | Freq: Every day | INTRAMUSCULAR | Status: DC

## 2015-07-23 MED ORDER — WARFARIN SODIUM 3 MG PO TABS
3.0000 mg | ORAL_TABLET | Freq: Every day | ORAL | Status: DC
  Administered 2015-07-23: 3 mg via ORAL
  Filled 2015-07-23 (×2): qty 1

## 2015-07-23 NOTE — Progress Notes (Signed)
ANTICOAGULATION CONSULT NOTE - Follow Up Consult  Pharmacy Consult for Coumadin Indication: atrial fibrillation  No Known Allergies  Patient Measurements: Height: 5\' 5"  (165.1 cm) Weight: 135 lb 2.3 oz (61.3 kg) IBW/kg (Calculated) : 57 Heparin Dosing Weight:   Vital Signs: Temp: 97.5 F (36.4 C) (11/04 0800) Temp Source: Oral (11/04 0800) BP: 116/60 mmHg (11/04 0800) Pulse Rate: 93 (11/04 0800)  Labs:  Recent Labs  07/21/15 1556  07/21/15 1606 07/22/15 0046 07/22/15 0745 07/22/15 1213 07/23/15 0454  HGB  --   < > 10.1*  --  8.7*  --  8.6*  HCT  --   --  32.3*  --  28.4*  --  27.3*  PLT  --   --  509*  --  481*  --  472*  LABPROT 21.0*  --   --   --  24.1*  --  23.4*  INR 1.82*  --   --   --  2.18*  --  2.10*  CREATININE  --   --  0.77  --  0.76  --  0.84  TROPONINI  --   --   --  <0.03 <0.03 <0.03  --   < > = values in this interval not displayed.  Estimated Creatinine Clearance: 53.7 mL/min (by C-G formula based on Cr of 0.84).   Medications:  Scheduled:  . antiseptic oral rinse  7 mL Mouth Rinse BID  . arformoterol  15 mcg Nebulization BID  . azithromycin  500 mg Intravenous Q24H  . cefTRIAXone (ROCEPHIN)  IV  1 g Intravenous Q24H  . diltiazem  180 mg Oral Daily  . furosemide  40 mg Intravenous Daily  . methylPREDNISolone (SOLU-MEDROL) injection  40 mg Intravenous Q12H  . pantoprazole  40 mg Oral Daily  . sodium chloride  3 mL Intravenous Q12H  . Warfarin - Pharmacist Dosing Inpatient   Does not apply q1800    Assessment: 74yo female with AFib.  INR therapeutic this AM, Hg wnl and pltc on high side.  No bleeding noted.    Goal of Therapy:  INR 2-3 Monitor platelets by anticoagulation protocol: Yes   Plan:  Coumadin 3mg  daily, as at home Continue daily INR Watch for s/s of bleeding  Marisue HumbleKendra Sharmila Wrobleski, PharmD Clinical Pharmacist [facility]

## 2015-07-23 NOTE — Progress Notes (Signed)
Dear Doctor: Lucretia RoersWood This patient has been identified as a candidate for PICC for the following reason (s): restarts due to phlebitis and infiltration in 24 hours If you agree, please write an order for the indicated device. For any questions contact the Vascular Access Team at [phone] if no answer, please leave a message.  Thank you for supporting the early vascular access assessment program.

## 2015-07-23 NOTE — Progress Notes (Signed)
[place] TEAM 1 - Stepdown/ICU TEAM Progress Note  Brenda Wells [id number] DOB: [birth year] DOA: 07/21/2015 PCP: [name],[name], MD  Admit HPI / Brief Narrative: 74 y.o. WF PMHx HTN, CHF, COPD on 3 L O2 at home, Acute Respiratory Failure, Atrial Fibrillation   Brought to the ER the patient was lying position shortness of breath. Patient states her symptoms started last week and has been gradually worsening with productive cough denies any chest pain. A shows initially placed on BiPAP in the ER. Chest x-ray shows cardiomegaly with pericardial effusion and COPD findings. On exam patient's chest is tight. Patient was taken off BiPAP. Patient states she was admitted last month [name] for COPD exacerbation. Patient has quit smoking 2 months ago. Patient has been admitted for COPD exacerbation. Denies any gain in weight. Patient is also noticed to have elevated heart rate and patient has history of A. fib.  HPI/Subjective: 11/4 A/O 4, states feels better now than this A.m., negative productive cough, negative wheezes, continued SOB but somewhat improved.   Assessment/Plan: Acute respiratory failure with hypoxia  -Most likely multifactorial to include COPD exacerbation, CHF, and PE?  -Brovana BID -Decrease Solu-Medrol 40 mg daily -Xopenex nebulizer QID -Flutter valve   COPD exacerbation/ CAP -Centrilobular Emphysema. -See acute respiratory failure with hypoxia  A. fib with RVR  (chads 2 vasc score is 3.) - probably increased due to shortness of breath and nebulizer use.  -Limit right increasing medication.  -Cardizem 180 mg daily -Coumadin per pharmacy, INR currently therapeutic (2.1)  Systolic CHF -EF slightly decreased -Strict in and out since admission;-2.5 L -Daily weight; 11/4 weight= 61.3 kg  DVT unspecified laterality -Coumadin per pharmacy -CT angiogram negative for PE    Code Status: FULL Family Communication: no family present at time of exam Disposition  Plan: Resolution respiratory failure    Consultants:   Procedure/Significant Events: 11/3 Echocardiogram;- LVEF=50%-55%. Regional wall motion abnormalities cannot be excluded. 11/3 CT chest PE protocol; negative PE. -Small bilateral pleural effusions.  -Partial right middle lobe volume loss with atelectasis. -Centrilobular Emphysema.   Culture 11/2 MRSA by PCR negative 11/3 influenza influenza A/B/H1N1 negative 11/3 respiratory virus pending 11/3 sputum pending   Antibiotics: Azithromycin 11/3>> Ceftriaxone 11/3>>  DVT prophylaxis: Coumadin   Devices    LINES / TUBES:      Continuous Infusions:   Objective: VITAL SIGNS: Temp: 98 F (36.7 C) (11/04 1200) Temp Source: Oral (11/04 1200) BP: 105/68 mmHg (11/04 1200) Pulse Rate: 90 (11/04 1200) SPO2; FIO2:   Intake/Output Summary (Last 24 hours) at 07/23/15 1643 Last data filed at 07/23/15 1430  Gross per 24 hour  Intake    296 ml  Output   2300 ml  Net  -2004 ml     Exam: General: A/O 4, acute on chronic respiratory distress Eyes: Negative headache, negative scleral hemorrhage ENT: Negative Runny nose,negative gingival bleeding, Neck:  Negative scars, masses, torticollis, lymphadenopathy, JVD Lungs:  Continued diffuse poor air movement in all lung fields, mild diffuse expiratory wheezing, negative crackles  Cardiovascular: regular rhythm and rate, without murmur gallop or rub normal S1 and S2 Abdomen:negative abdominal pain, nondistended, positive soft, bowel sounds, no rebound, no ascites, no appreciable mass Extremities: No significant cyanosis, clubbing, or edema bilateral lower extremities Psychiatric:  Negative depression, negative anxiety, negative fatigue, negative mania  Neurologic:  Cranial nerves II through XII intact, tongue/uvula midline, all extremities muscle strength 5/5, sensation intact throughout, negative dysarthria, negative expressive aphasia, negative receptive aphasia.   Data  Reviewed:  Basic Metabolic Panel:  Recent Labs Lab 07/21/15 1606 07/22/15 0745 07/23/15 0454  NA 140 139 137  K 3.6 3.8 3.9  CL 98* 95* 93*  CO2 28 35* 32  GLUCOSE 137* 192* 175*  BUN CREATININE 0.77 0.76 0.84  CALCIUM 8.5* 8.5* 8.5*  MG  --  1.6* 1.8   Liver Function Tests:  Recent Labs Lab 07/21/15 1556 07/22/15 0745 07/23/15 0454  AST ALT ALKPHOS 76 55 54  BILITOT 0.4 0.3 0.2*  PROT 6.0* 5.1* 5.3*  ALBUMIN 2.6* 2.2* 2.2*   No results for input(s): LIPASE, AMYLASE in the last 168 hours. No results for input(s): AMMONIA in the last 168 hours. CBC:  Recent Labs Lab 07/21/15 1606 07/22/15 0745 07/23/15 0454  WBC 16.0* 7.7 12.7*  NEUTROABS  --  6.2 10.9*  HGB 10.1* 8.7* 8.6*  HCT 32.3* 28.4* 27.3*  MCV 84.1 83.3 83.2  PLT 509* 481* 472*   Cardiac Enzymes:  Recent Labs Lab 07/22/15 0046 07/22/15 0745 07/22/15 1213  TROPONINI <0.03 <0.03 <0.03   BNP (last 3 results)  Recent Labs  07/21/15 1638  BNP 102.0*    ProBNP (last 3 results) No results for input(s): PROBNP in the last 8760 hours.  CBG: No results for input(s): GLUCAP in the last 168 hours.  Recent Results (from the past 240 hour(s))  MRSA PCR Screening     Status: None   Collection Time: 07/21/15 11:15 PM  Result Value Ref Range Status   MRSA by PCR NEGATIVE NEGATIVE Final    Comment:        The GeneXpert MRSA Assay (FDA approved for NASAL specimens only), is one component of a comprehensive MRSA colonization surveillance program. It is not intended to diagnose MRSA infection nor to guide or monitor treatment for MRSA infections.      Studies:  Recent x-ray studies have been reviewed in detail by the Attending Physician  Scheduled Meds:  Scheduled Meds: . antiseptic oral rinse  7 mL Mouth Rinse BID  . arformoterol  15 mcg Nebulization BID  . azithromycin  500 mg Intravenous Q24H  . cefTRIAXone (ROCEPHIN)  IV  1 g Intravenous Q24H  .  diltiazem  180 mg Oral Daily  . furosemide  40 mg Intravenous Daily  . [START ON 07/24/2015] methylPREDNISolone (SOLU-MEDROL) injection  40 mg Intravenous Daily  . pantoprazole  40 mg Oral Daily  . sodium chloride  3 mL Intravenous Q12H  . warfarin  3 mg Oral q1800  . Warfarin - Pharmacist Dosing Inpatient   Does not apply q1800    Time spent on care of this patient: 40 mins   WOODS, [name] , MD  [facility] Office  [phone] Pager - [phone]  On-Call/Text Page:      [email or web address]      password [email or web address]  If 7PM-7AM, please contact night-coverage [email or web address] Password [email or web address] 07/23/2015, 4:43 PM   LOS: 2 days   Care during the described time interval was provided by me .  I have reviewed this patient's available data, including medical history, events of note, physical examination, and all test results as part of my evaluation. I have personally reviewed and interpreted all radiology studies.   Carolyne Littles, MD [phone] Pager

## 2015-07-24 LAB — CBC WITH DIFFERENTIAL/PLATELET
BASOS ABS: 0 10*3/uL (ref 0.0–0.1)
BASOS PCT: 0 %
Eosinophils Absolute: 0 10*3/uL (ref 0.0–0.7)
Eosinophils Relative: 0 %
HEMATOCRIT: 28.8 % — AB (ref 36.0–46.0)
HEMOGLOBIN: 8.5 g/dL — AB (ref 12.0–15.0)
LYMPHS PCT: 11 %
Lymphs Abs: 1.5 10*3/uL (ref 0.7–4.0)
MCH: 25.1 pg — ABNORMAL LOW (ref 26.0–34.0)
MCHC: 29.5 g/dL — ABNORMAL LOW (ref 30.0–36.0)
MCV: 85 fL (ref 78.0–100.0)
Monocytes Absolute: 1 10*3/uL (ref 0.1–1.0)
Monocytes Relative: 7 %
NEUTROS ABS: 11.6 10*3/uL — AB (ref 1.7–7.7)
NEUTROS PCT: 82 %
Platelets: 475 10*3/uL — ABNORMAL HIGH (ref 150–400)
RBC: 3.39 MIL/uL — AB (ref 3.87–5.11)
RDW: 17.9 % — ABNORMAL HIGH (ref 11.5–15.5)
WBC: 14 10*3/uL — ABNORMAL HIGH (ref 4.0–10.5)

## 2015-07-24 LAB — COMPREHENSIVE METABOLIC PANEL
ALBUMIN: 2.1 g/dL — AB (ref 3.5–5.0)
ALK PHOS: 51 U/L (ref 38–126)
ALT: 15 U/L (ref 14–54)
ANION GAP: 10 (ref 5–15)
AST: 20 U/L (ref 15–41)
BILIRUBIN TOTAL: 0.2 mg/dL — AB (ref 0.3–1.2)
BUN: 22 mg/dL — ABNORMAL HIGH (ref 6–20)
CALCIUM: 8 mg/dL — AB (ref 8.9–10.3)
CO2: 33 mmol/L — ABNORMAL HIGH (ref 22–32)
Chloride: 95 mmol/L — ABNORMAL LOW (ref 101–111)
Creatinine, Ser: 0.88 mg/dL (ref 0.44–1.00)
GFR calc non Af Amer: >60 mL/min (ref >60)
Glucose, Bld: 184 mg/dL — ABNORMAL HIGH (ref 65–99)
POTASSIUM: 3.6 mmol/L (ref 3.5–5.1)
Sodium: 138 mmol/L (ref 135–145)
TOTAL PROTEIN: 4.9 g/dL — AB (ref 6.5–8.1)

## 2015-07-24 LAB — PROTIME-INR
INR: 1.79 — ABNORMAL HIGH (ref 0.00–1.49)
PROTHROMBIN TIME: 20.7 s — AB (ref 11.6–15.2)

## 2015-07-24 LAB — MAGNESIUM: Magnesium: 1.8 mg/dL (ref 1.7–2.4)

## 2015-07-24 MED ORDER — CEFUROXIME AXETIL 500 MG PO TABS
500.0000 mg | ORAL_TABLET | Freq: Two times a day (BID) | ORAL | Status: DC
  Administered 2015-07-24 – 2015-07-27 (×6): 500 mg via ORAL
  Filled 2015-07-24 (×6): qty 1

## 2015-07-24 MED ORDER — WARFARIN SODIUM 4 MG PO TABS
4.0000 mg | ORAL_TABLET | Freq: Once | ORAL | Status: AC
  Administered 2015-07-24: 4 mg via ORAL
  Filled 2015-07-24: qty 1

## 2015-07-24 MED ORDER — FUROSEMIDE 40 MG PO TABS
40.0000 mg | ORAL_TABLET | Freq: Every day | ORAL | Status: DC
  Administered 2015-07-24 – 2015-07-27 (×4): 40 mg via ORAL
  Filled 2015-07-24 (×4): qty 1

## 2015-07-24 MED ORDER — WARFARIN SODIUM 3 MG PO TABS
3.0000 mg | ORAL_TABLET | Freq: Every day | ORAL | Status: DC
Start: 2015-07-25 — End: 2015-07-26
  Filled 2015-07-24 (×2): qty 1

## 2015-07-24 MED ORDER — PREDNISONE 50 MG PO TABS
50.0000 mg | ORAL_TABLET | Freq: Every day | ORAL | Status: DC
  Administered 2015-07-24 – 2015-07-26 (×3): 50 mg via ORAL
  Filled 2015-07-24 (×3): qty 1

## 2015-07-24 MED ORDER — AZITHROMYCIN 500 MG PO TABS
500.0000 mg | ORAL_TABLET | Freq: Every day | ORAL | Status: DC
  Administered 2015-07-24 – 2015-07-26 (×3): 500 mg via ORAL
  Filled 2015-07-24 (×3): qty 1

## 2015-07-24 NOTE — Progress Notes (Signed)
[place] TEAM 1 - Stepdown/ICU TEAM Progress Note  Brenda Wells ZOX:[id number] DOB: 1941-03-17 DOA: 07/21/2015 PCP: [name], MD  Admit HPI / Brief Narrative: 74 y.o. WF PMHx HTN, CHF, COPD on 3 L O2 at home, Acute Respiratory Failure, Atrial Fibrillation   Brought to the ER the patient was lying position shortness of breath. Patient states her symptoms started last week and has been gradually worsening with productive cough denies any chest pain. A shows initially placed on BiPAP in the ER. Chest x-ray shows cardiomegaly with pericardial effusion and COPD findings. On exam patient's chest is tight. Patient was taken off BiPAP. Patient states she was admitted last month [name] for COPD exacerbation. Patient has quit smoking 2 months ago. Patient has been admitted for COPD exacerbation. Denies any gain in weight. Patient is also noticed to have elevated heart rate and patient has history of A. fib.  HPI/Subjective: 11/4 A/O , states feels better but still some SOB,  negative productive cough, negative wheezes, continued SOB but somewhat improved.   Assessment/Plan: Acute respiratory failure with hypoxia  -Most likely multifactorial to include COPD exacerbation, CHF -Brovana BID -Decrease Solu-Medrol 40 mg daily; change to prednisone 50 mg daily -Xopenex nebulizer QID -Flutter valve  -Ambulatory SPO2 in the Am on patient's home dose of O2 (3 L).  COPD exacerbation/ CAP -Centrilobular Emphysema. -See acute respiratory failure with hypoxia  A. fib with RVR  (chads 2 vasc score is 3.) -Rate controlled.  -Cardizem 180 mg daily -Coumadin per pharmacy, INR currently therapeutic (2.1)  Systolic CHF -EF slightly decreased -Strict in and out since admission;- 3.8 L -Daily weight; 11/5 weight= 62.1 kg (accuracy?)  DVT unspecified laterality -Coumadin per pharmacy -CT angiogram negative for PE    Code Status: FULL Family Communication: no family present at time of  exam Disposition Plan: Resolution respiratory failure    Consultants:   Procedure/Significant Events: 11/3 Echocardiogram;- LVEF=50%-55%. Regional wall motion abnormalities cannot be excluded. 11/3 CT chest PE protocol; negative PE. -Small bilateral pleural effusions.  -Partial right middle lobe volume loss with atelectasis. -Centrilobular Emphysema.   Culture 11/2 MRSA by PCR negative 11/3 influenza influenza A/B/H1N1 negative 11/3 respiratory virus pending 11/3 sputum pending   Antibiotics: Azithromycin 11/3>> Ceftriaxone 11/3>> stopped 11/5 Ceftin 11/5>>  DVT prophylaxis: Coumadin   Devices    LINES / TUBES:      Continuous Infusions:   Objective: VITAL SIGNS: Temp: 98.1 F (36.7 C) (11/05 0356) Temp Source: Oral (11/05 0356) BP: 112/58 mmHg (11/05 0400) Pulse Rate: 87 (11/05 0400) SPO2; FIO2:   Intake/Output Summary (Last 24 hours) at 07/24/15 0806 Last data filed at 07/24/15 0600  Gross per 24 hour  Intake    303 ml  Output   1450 ml  Net  -1147 ml     Exam: General: A/O 4, acute on chronic respiratory distress Eyes: Negative headache, negative scleral hemorrhage ENT: Negative Runny nose,negative gingival bleeding, Neck:  Negative scars, masses, torticollis, lymphadenopathy, JVD Lungs:  Continued diffuse poor air movement in all lung fields (improved from 11/4), mild diffuse expiratory wheezing, negative crackles  Cardiovascular: regular rhythm and rate, without murmur gallop or rub normal S1 and S2 Abdomen:negative abdominal pain, nondistended, positive soft, bowel sounds, no rebound, no ascites, no appreciable mass Extremities: No significant cyanosis, clubbing, or edema bilateral lower extremities Psychiatric:  Negative depression, negative anxiety, negative fatigue, negative mania  Neurologic:  Cranial nerves II through XII intact, tongue/uvula midline, all extremities muscle strength 5/5, sensation intact throughout, negative  dysarthria, negative expressive aphasia, negative receptive aphasia.   Data Reviewed: Basic Metabolic Panel:  Recent Labs Lab 07/21/15 1606 07/22/15 0745 07/23/15 0454 07/24/15 0502  NA 140 139 137 138  K 3.6 3.8 3.9 3.6  CL 98* 95* 93* 95*  CO2 28 35* 32 33*  GLUCOSE 137* 192* 175* 184*  BUN 11 12 17  22*  CREATININE 0.77 0.76 0.84 0.88  CALCIUM 8.5* 8.5* 8.5* 8.0*  MG  --  1.6* 1.8 1.8   Liver Function Tests:  Recent Labs Lab 07/21/15 1556 07/22/15 0745 07/23/15 0454 07/24/15 0502  AST 25 19 17 20   ALT 18 15 16 15   ALKPHOS 76 55 54 51  BILITOT 0.4 0.3 0.2* 0.2*  PROT 6.0* 5.1* 5.3* 4.9*  ALBUMIN 2.6* 2.2* 2.2* 2.1*   No results for input(s): LIPASE, AMYLASE in the last 168 hours. No results for input(s): AMMONIA in the last 168 hours. CBC:  Recent Labs Lab 07/21/15 1606 07/22/15 0745 07/23/15 0454 07/24/15 0502  WBC 16.0* 7.7 12.7* 14.0*  NEUTROABS  --  6.2 10.9* 11.6*  HGB 10.1* 8.7* 8.6* 8.5*  HCT 32.3* 28.4* 27.3* 28.8*  MCV 84.1 83.3 83.2 85.0  PLT 509* 481* 472* 475*   Cardiac Enzymes:  Recent Labs Lab 07/22/15 0046 07/22/15 0745 07/22/15 1213  TROPONINI <0.03 <0.03 <0.03   BNP (last 3 results)  Recent Labs  07/21/15 1638  BNP 102.0*    ProBNP (last 3 results) No results for input(s): PROBNP in the last 8760 hours.  CBG: No results for input(s): GLUCAP in the last 168 hours.  Recent Results (from the past 240 hour(s))  MRSA PCR Screening     Status: None   Collection Time: 07/21/15 11:15 PM  Result Value Ref Range Status   MRSA by PCR NEGATIVE NEGATIVE Final    Comment:        The GeneXpert MRSA Assay (FDA approved for NASAL specimens only), is one component of a comprehensive MRSA colonization surveillance program. It is not intended to diagnose MRSA infection nor to guide or monitor treatment for MRSA infections.      Studies:  Recent x-ray studies have been reviewed in detail by the Attending  Physician  Scheduled Meds:  Scheduled Meds: . antiseptic oral rinse  7 mL Mouth Rinse BID  . arformoterol  15 mcg Nebulization BID  . azithromycin  500 mg Intravenous Q24H  . cefTRIAXone (ROCEPHIN)  IV  1 g Intravenous Q24H  . diltiazem  180 mg Oral Daily  . furosemide  40 mg Intravenous Daily  . methylPREDNISolone (SOLU-MEDROL) injection  40 mg Intravenous Daily  . pantoprazole  40 mg Oral Daily  . sodium chloride  3 mL Intravenous Q12H  . warfarin  3 mg Oral q1800  . Warfarin - Pharmacist Dosing Inpatient   Does not apply q1800    Time spent on care of this patient: 40 mins   Brenda Wells, Brenda Wells , MD  [facility] Office  [phone] Pager - [phone]  On-Call/Text Page:      Brenda [email or web address]      password [email or web address]  If 7PM-7AM, please contact night-coverage [email or web address] Password [email or web address] 07/24/2015, 8:06 AM   LOS: 3 days   Care during the described time interval was provided by me .  I have reviewed this patient's available data, including medical history, events of note, physical examination, and all test results as part of my evaluation. I have personally reviewed and interpreted all radiology studies.   Brenda Wells Avid Guillette, MD [phone]  Pager

## 2015-07-24 NOTE — Progress Notes (Signed)
Patient is on 4LNC with sats of 96%. All vitals are stable and BIPAP is not needed at this time. 

## 2015-07-24 NOTE — Progress Notes (Signed)
ANTICOAGULATION CONSULT NOTE - Follow Up Consult  Pharmacy Consult for Coumadin Indication: atrial fibrillation  No Known Allergies  Patient Measurements: Height: 5\' 5"  (165.1 cm) Weight: 136 lb 14.5 oz (62.1 kg) IBW/kg (Calculated) : 57 Heparin Dosing Weight:   Vital Signs: Temp: 98.1 F (36.7 C) (11/05 0356) Temp Source: Oral (11/05 0356) BP: 116/76 mmHg (11/05 0846) Pulse Rate: 89 (11/05 0846)  Labs:  Recent Labs  07/22/15 0046 07/22/15 0745 07/22/15 1213 07/23/15 0454 07/24/15 0502  HGB  --  8.7*  --  8.6* 8.5*  HCT  --  28.4*  --  27.3* 28.8*  PLT  --  481*  --  472* 475*  LABPROT  --  24.1*  --  23.4* 20.7*  INR  --  2.18*  --  2.10* 1.79*  CREATININE  --  0.76  --  0.84 0.88  TROPONINI <0.03 <0.03 <0.03  --   --     Estimated Creatinine Clearance: 51.2 mL/min (by C-G formula based on Cr of 0.88).   Medications:  Scheduled:  . antiseptic oral rinse  7 mL Mouth Rinse BID  . arformoterol  15 mcg Nebulization BID  . azithromycin  500 mg Oral Daily  . cefUROXime  500 mg Oral BID WC  . diltiazem  180 mg Oral Daily  . furosemide  40 mg Oral Daily  . pantoprazole  40 mg Oral Daily  . predniSONE  50 mg Oral Q breakfast  . sodium chloride  3 mL Intravenous Q12H  . warfarin  3 mg Oral q1800  . Warfarin - Pharmacist Dosing Inpatient   Does not apply q1800    Assessment: 74yo female with AFib, INR down today.  Hg is stable and pltc are high.  No bleeding noted.  Will give a larger dose of Coumadin today, then resume home dose on 11/6.  Goal of Therapy:  INR 2-3 Monitor platelets by anticoagulation protocol: Yes   Plan:  Coumadin 4mg  today, then resume 3mg  daily on 11/6 Daily INR Watch for s/s of bleeding  Marisue HumbleKendra Lenay Lovejoy, PharmD Clinical Pharmacist [facility]

## 2015-07-25 DIAGNOSIS — J189 Pneumonia, unspecified organism: Secondary | ICD-10-CM

## 2015-07-25 LAB — CBC WITH DIFFERENTIAL/PLATELET
Basophils Absolute: 0 10*3/uL (ref 0.0–0.1)
Basophils Relative: 0 %
EOS ABS: 0 10*3/uL (ref 0.0–0.7)
Eosinophils Relative: 0 %
HEMATOCRIT: 29.1 % — AB (ref 36.0–46.0)
HEMOGLOBIN: 8.8 g/dL — AB (ref 12.0–15.0)
LYMPHS ABS: 1.9 10*3/uL (ref 0.7–4.0)
LYMPHS PCT: 15 %
MCH: 25.7 pg — AB (ref 26.0–34.0)
MCHC: 30.2 g/dL (ref 30.0–36.0)
MCV: 84.8 fL (ref 78.0–100.0)
MONOS PCT: 7 %
Monocytes Absolute: 1 10*3/uL (ref 0.1–1.0)
NEUTROS ABS: 10 10*3/uL — AB (ref 1.7–7.7)
NEUTROS PCT: 78 %
Platelets: 467 10*3/uL — ABNORMAL HIGH (ref 150–400)
RBC: 3.43 MIL/uL — ABNORMAL LOW (ref 3.87–5.11)
RDW: 17.9 % — ABNORMAL HIGH (ref 11.5–15.5)
WBC: 12.9 10*3/uL — ABNORMAL HIGH (ref 4.0–10.5)

## 2015-07-25 LAB — PROTIME-INR
INR: 2 — AB (ref 0.00–1.49)
PROTHROMBIN TIME: 22.6 s — AB (ref 11.6–15.2)

## 2015-07-25 LAB — RESPIRATORY VIRUS PANEL
ADENOVIRUS: NEGATIVE
Influenza A: NEGATIVE
Influenza B: NEGATIVE
METAPNEUMOVIRUS: NEGATIVE
Parainfluenza 1: NEGATIVE
Parainfluenza 2: NEGATIVE
Parainfluenza 3: NEGATIVE
RESPIRATORY SYNCYTIAL VIRUS A: NEGATIVE
RHINOVIRUS: NEGATIVE
Respiratory Syncytial Virus B: NEGATIVE

## 2015-07-25 LAB — COMPREHENSIVE METABOLIC PANEL
ALT: 17 U/L (ref 14–54)
AST: 23 U/L (ref 15–41)
Albumin: 2.2 g/dL — ABNORMAL LOW (ref 3.5–5.0)
Alkaline Phosphatase: 48 U/L (ref 38–126)
Anion gap: 9 (ref 5–15)
BUN: 23 mg/dL — ABNORMAL HIGH (ref 6–20)
CHLORIDE: 94 mmol/L — AB (ref 101–111)
CO2: 36 mmol/L — ABNORMAL HIGH (ref 22–32)
Calcium: 8.2 mg/dL — ABNORMAL LOW (ref 8.9–10.3)
Creatinine, Ser: 1.03 mg/dL — ABNORMAL HIGH (ref 0.44–1.00)
GFR calc non Af Amer: 53 mL/min — ABNORMAL LOW (ref >60)
Glucose, Bld: 156 mg/dL — ABNORMAL HIGH (ref 65–99)
POTASSIUM: 4 mmol/L (ref 3.5–5.1)
Sodium: 139 mmol/L (ref 135–145)
Total Bilirubin: 0.3 mg/dL (ref 0.3–1.2)
Total Protein: 5.4 g/dL — ABNORMAL LOW (ref 6.5–8.1)

## 2015-07-25 LAB — MAGNESIUM: Magnesium: 1.9 mg/dL (ref 1.7–2.4)

## 2015-07-25 MED ORDER — WARFARIN SODIUM 3 MG PO TABS
3.0000 mg | ORAL_TABLET | Freq: Every day | ORAL | Status: DC
  Administered 2015-07-25: 3 mg via ORAL
  Filled 2015-07-25 (×2): qty 1

## 2015-07-25 NOTE — Progress Notes (Signed)
[place] TEAM 1 - Stepdown/ICU TEAM Progress Note  Brenda Wells WUJ:[id number] DOB: Jan 23, 1941 DOA: 07/21/2015 PCP: [name],[name], MD  Admit HPI / Brief Narrative: 74 y.o. WF PMHx HTN, CHF, COPD on 3 L O2 at home, Acute Respiratory Failure, Atrial Fibrillation   Brought to the ER the patient was lying position shortness of breath. Patient states her symptoms started last week and has been gradually worsening with productive cough denies any chest pain. A shows initially placed on BiPAP in the ER. Chest x-ray shows cardiomegaly with pericardial effusion and COPD findings. On exam patient's chest is tight. Patient was taken off BiPAP. Patient states she was admitted last month [name] for COPD exacerbation. Patient has quit smoking 2 months ago. Patient has been admitted for COPD exacerbation. Denies any gain in weight. Patient is also noticed to have elevated heart rate and patient has history of A. fib.  HPI/Subjective: 11/6 A/O , states feels better but still some SOB,  able to ambulate around room without DOE.    Assessment/Plan: Acute respiratory failure with hypoxia  -Most likely multifactorial to include COPD exacerbation, CHF -Brovana BID -Continue prednisone 50 mg daily, may have to extend for a couple weeks post discharge -Xopenex nebulizer QID -Flutter valve  -Ambulatory SPO2 in the Am on patient's home dose of O2 (3 L).  COPD exacerbation/ CAP -Centrilobular Emphysema. -See acute respiratory failure with hypoxia  A. fib with RVR  (chads 2 vasc score is 3.) -Rate controlled.  -Cardizem 180 mg daily -Coumadin per pharmacy, INR currently therapeutic (2.1)  Systolic CHF -EF slightly decreased -Strict in and out since admission;-5.7 L -Daily weight; 11/6 weight= 61 kg (accuracy?)  DVT unspecified laterality -Coumadin per pharmacy -CT angiogram negative for PE    Code Status: FULL Family Communication: no family present at time of exam Disposition Plan:  Resolution respiratory failure    Consultants:   Procedure/Significant Events: 11/3 Echocardiogram;- LVEF=50%-55%. Regional wall motion abnormalities cannot be excluded. 11/3 CT chest PE protocol; negative PE. -Small bilateral pleural effusions.  -Partial right middle lobe volume loss with atelectasis. -Centrilobular Emphysema.   Culture 11/2 MRSA by PCR negative 11/3 influenza influenza A/B/H1N1 negative 11/3 respiratory virus pending 11/3 sputum pending   Antibiotics: Azithromycin 11/3>> Ceftriaxone 11/3>> stopped 11/5 Ceftin 11/5>>  DVT prophylaxis: Coumadin   Devices    LINES / TUBES:      Continuous Infusions:   Objective: VITAL SIGNS: Temp: 97.8 F (36.6 C) (11/06 1600) Temp Source: Oral (11/06 1600) BP: 111/65 mmHg (11/06 1600) Pulse Rate: 94 (11/06 1600) SPO2; FIO2:   Intake/Output Summary (Last 24 hours) at 07/25/15 1810 Last data filed at 07/25/15 1343  Gross per 24 hour  Intake    480 ml  Output   2450 ml  Net  -1970 ml     Exam: General: A/O 4, acute on chronic respiratory distress Eyes: Negative headache, negative scleral hemorrhage ENT: Negative Runny nose,negative gingival bleeding, Neck:  Negative scars, masses, torticollis, lymphadenopathy, JVD Lungs:  Continued diffuse poor air movement in all lung fields (improved from 11/5), negative expiratory wheezing, negative crackles  Cardiovascular: regular rhythm and rate, without murmur gallop or rub normal S1 and S2 Abdomen:negative abdominal pain, nondistended, positive soft, bowel sounds, no rebound, no ascites, no appreciable mass Extremities: No significant cyanosis, clubbing, or edema bilateral lower extremities Psychiatric:  Negative depression, negative anxiety, negative fatigue, negative mania  Neurologic:  Cranial nerves II through XII intact, tongue/uvula midline, all extremities muscle strength 5/5, sensation intact throughout, negative  dysarthria, negative expressive  aphasia, negative receptive aphasia.   Data Reviewed: Basic Metabolic Panel:  Recent Labs Lab 07/21/15 1606 07/22/15 0745 07/23/15 0454 07/24/15 0502 07/25/15 0200  NA 140 139 137 138 139  K 3.6 3.8 3.9 3.6 4.0  CL 98* 95* 93* 95* 94*  CO2 28 35* 32 33* 36*  GLUCOSE 137* 192* 175* 184* 156*  BUN 22* 23*  CREATININE 0.77 0.76 0.84 0.88 1.03*  CALCIUM 8.5* 8.5* 8.5* 8.0* 8.2*  MG  --  1.6* 1.8 1.8 1.9   Liver Function Tests:  Recent Labs Lab 07/21/15 1556 07/22/15 0745 07/23/15 0454 07/24/15 0502 07/25/15 0200  AST ALT ALKPHOS 76 55 54 51 48  BILITOT 0.4 0.3 0.2* 0.2* 0.3  PROT 6.0* 5.1* 5.3* 4.9* 5.4*  ALBUMIN 2.6* 2.2* 2.2* 2.1* 2.2*   No results for input(s): LIPASE, AMYLASE in the last 168 hours. No results for input(s): AMMONIA in the last 168 hours. CBC:  Recent Labs Lab 07/21/15 1606 07/22/15 0745 07/23/15 0454 07/24/15 0502 07/25/15 0200  WBC 16.0* 7.7 12.7* 14.0* 12.9*  NEUTROABS  --  6.2 10.9* 11.6* 10.0*  HGB 10.1* 8.7* 8.6* 8.5* 8.8*  HCT 32.3* 28.4* 27.3* 28.8* 29.1*  MCV 84.1 83.3 83.2 85.0 84.8  PLT 509* 481* 472* 475* 467*   Cardiac Enzymes:  Recent Labs Lab 07/22/15 0046 07/22/15 0745 07/22/15 1213  TROPONINI <0.03 <0.03 <0.03   BNP (last 3 results)  Recent Labs  07/21/15 1638  BNP 102.0*    ProBNP (last 3 results) No results for input(s): PROBNP in the last 8760 hours.  CBG: No results for input(s): GLUCAP in the last 168 hours.  Recent Results (from the past 240 hour(s))  MRSA PCR Screening     Status: None   Collection Time: 07/21/15 11:15 PM  Result Value Ref Range Status   MRSA by PCR NEGATIVE NEGATIVE Final    Comment:        The GeneXpert MRSA Assay (FDA approved for NASAL specimens only), is one component of a comprehensive MRSA colonization surveillance program. It is not intended to diagnose MRSA infection nor to guide or monitor treatment for MRSA  infections.   Respiratory virus panel     Status: None   Collection Time: 07/23/15  1:30 PM  Result Value Ref Range Status   Respiratory Syncytial Virus A Negative Negative Final   Respiratory Syncytial Virus B Negative Negative Final   Influenza A Negative Negative Final   Influenza B Negative Negative Final   Parainfluenza 1 Negative Negative Final   Parainfluenza 2 Negative Negative Final   Parainfluenza 3 Negative Negative Final   Metapneumovirus Negative Negative Final   Rhinovirus Negative Negative Final   Adenovirus Negative Negative Final    Comment: (NOTE) Performed At: [facility] [address] [place], [place] [id number] Mila Homer MD MV:[id number]      Studies:  Recent x-ray studies have been reviewed in detail by the Attending Physician  Scheduled Meds:  Scheduled Meds: . antiseptic oral rinse  7 mL Mouth Rinse BID  . arformoterol  15 mcg Nebulization BID  . azithromycin  500 mg Oral Daily  . cefUROXime  500 mg Oral BID WC  . diltiazem  180 mg Oral Daily  . furosemide  40 mg Oral Daily  . pantoprazole  40 mg Oral Daily  . predniSONE  50 mg Oral Q breakfast  .  sodium chloride  3 mL Intravenous Q12H  . warfarin  3 mg Oral q1800  . warfarin  3 mg Oral q1800  . Warfarin - Pharmacist Dosing Inpatient   Does not apply q1800    Time spent on care of this patient: 40 mins   Epifanio Labrador, [name] , MD  [facility] Office  [phone] Pager - [phone]  On-Call/Text Page:      [email or web address]      password [email or web address]  If 7PM-7AM, please contact night-coverage [email or web address] Password [email or web address] 07/25/2015, 6:10 PM   LOS: 4 days   Care during the described time interval was provided by me .  I have reviewed this patient's available data, including medical history, events of note, physical examination, and all test results as part of my evaluation. I have personally reviewed and interpreted all radiology studies.   Carolyne Littles, MD [phone]  Pager

## 2015-07-25 NOTE — Progress Notes (Signed)
ANTICOAGULATION CONSULT NOTE - Follow Up Consult  Pharmacy Consult for Coumadin Indication: atrial fibrillation  No Known Allergies  Patient Measurements: Height: 5\' 5"  (165.1 cm) Weight: 134 lb 7.7 oz (61 kg) IBW/kg (Calculated) : 57 Heparin Dosing Weight:   Vital Signs: Temp: 98 F (36.7 C) (11/06 0800) Temp Source: Oral (11/06 0800) BP: 109/74 mmHg (11/06 1200) Pulse Rate: 90 (11/06 1200)  Labs:  Recent Labs  07/22/15 1213  07/23/15 0454 07/24/15 0502 07/25/15 0200  HGB  --   < > 8.6* 8.5* 8.8*  HCT  --   --  27.3* 28.8* 29.1*  PLT  --   --  472* 475* 467*  LABPROT  --   --  23.4* 20.7* 22.6*  INR  --   --  2.10* 1.79* 2.00*  CREATININE  --   --  0.84 0.88 1.03*  TROPONINI <0.03  --   --   --   --   < > = values in this interval not displayed.  Estimated Creatinine Clearance: 43.8 mL/min (by C-G formula based on Cr of 1.03).   Medications:  Scheduled:  . antiseptic oral rinse  7 mL Mouth Rinse BID  . arformoterol  15 mcg Nebulization BID  . azithromycin  500 mg Oral Daily  . cefUROXime  500 mg Oral BID WC  . diltiazem  180 mg Oral Daily  . furosemide  40 mg Oral Daily  . pantoprazole  40 mg Oral Daily  . predniSONE  50 mg Oral Q breakfast  . sodium chloride  3 mL Intravenous Q12H  . warfarin  3 mg Oral q1800  . Warfarin - Pharmacist Dosing Inpatient   Does not apply q1800    Assessment: 74yo female with AFib.  INR at goal today.  Hg stable, pltc high.  No bleeding noted.  Pt on antibiotics for COPD exacerbation.  Goal of Therapy:  INR 2-3 Monitor platelets by anticoagulation protocol: Yes   Plan:  Coumadin 3mg  daily Continue daily INR Watch for s/s of bleeding  Marisue HumbleKendra Dickey Caamano, PharmD Clinical Pharmacist [facility]

## 2015-07-25 NOTE — Progress Notes (Signed)
Per Jorje GuildMary Wells, NT pt ambulated 1850ft in hallway with 3L's O2 via .  Per NT pt dropped 83% for a brief second and came back up to 94% on 3L's O2 via nasal cannula.

## 2015-07-25 NOTE — Progress Notes (Signed)
Utilization Review Completed.  

## 2015-07-26 DIAGNOSIS — J9601 Acute respiratory failure with hypoxia: Secondary | ICD-10-CM

## 2015-07-26 LAB — CBC WITH DIFFERENTIAL/PLATELET
BASOS PCT: 0 %
Basophils Absolute: 0 10*3/uL (ref 0.0–0.1)
EOS ABS: 0 10*3/uL (ref 0.0–0.7)
Eosinophils Relative: 0 %
HEMATOCRIT: 30.6 % — AB (ref 36.0–46.0)
HEMOGLOBIN: 9.1 g/dL — AB (ref 12.0–15.0)
LYMPHS ABS: 2.2 10*3/uL (ref 0.7–4.0)
Lymphocytes Relative: 16 %
MCH: 25.1 pg — AB (ref 26.0–34.0)
MCHC: 29.7 g/dL — AB (ref 30.0–36.0)
MCV: 84.5 fL (ref 78.0–100.0)
MONO ABS: 0.9 10*3/uL (ref 0.1–1.0)
MONOS PCT: 7 %
NEUTROS ABS: 10.8 10*3/uL — AB (ref 1.7–7.7)
Neutrophils Relative %: 77 %
Platelets: 432 10*3/uL — ABNORMAL HIGH (ref 150–400)
RBC: 3.62 MIL/uL — ABNORMAL LOW (ref 3.87–5.11)
RDW: 17.5 % — AB (ref 11.5–15.5)
WBC: 14 10*3/uL — ABNORMAL HIGH (ref 4.0–10.5)

## 2015-07-26 LAB — COMPREHENSIVE METABOLIC PANEL
ALK PHOS: 51 U/L (ref 38–126)
ALT: 21 U/L (ref 14–54)
AST: 21 U/L (ref 15–41)
Albumin: 2.4 g/dL — ABNORMAL LOW (ref 3.5–5.0)
Anion gap: 7 (ref 5–15)
BUN: 26 mg/dL — ABNORMAL HIGH (ref 6–20)
CALCIUM: 8.2 mg/dL — AB (ref 8.9–10.3)
CHLORIDE: 95 mmol/L — AB (ref 101–111)
CO2: 36 mmol/L — AB (ref 22–32)
CREATININE: 0.96 mg/dL (ref 0.44–1.00)
GFR calc Af Amer: >60 mL/min (ref >60)
GFR calc non Af Amer: 57 mL/min — ABNORMAL LOW (ref >60)
GLUCOSE: 159 mg/dL — AB (ref 65–99)
Potassium: 4.5 mmol/L (ref 3.5–5.1)
SODIUM: 138 mmol/L (ref 135–145)
Total Bilirubin: 0.4 mg/dL (ref 0.3–1.2)
Total Protein: 5.3 g/dL — ABNORMAL LOW (ref 6.5–8.1)

## 2015-07-26 LAB — MAGNESIUM: Magnesium: 2.1 mg/dL (ref 1.7–2.4)

## 2015-07-26 LAB — PROTIME-INR
INR: 1.74 — AB (ref 0.00–1.49)
Prothrombin Time: 20.3 seconds — ABNORMAL HIGH (ref 11.6–15.2)

## 2015-07-26 MED ORDER — IPRATROPIUM BROMIDE 0.02 % IN SOLN
0.5000 mg | Freq: Four times a day (QID) | RESPIRATORY_TRACT | Status: DC
  Administered 2015-07-26 – 2015-07-27 (×3): 0.5 mg via RESPIRATORY_TRACT
  Filled 2015-07-26 (×3): qty 2.5

## 2015-07-26 MED ORDER — LEVALBUTEROL HCL 0.63 MG/3ML IN NEBU
0.6300 mg | INHALATION_SOLUTION | RESPIRATORY_TRACT | Status: DC | PRN
  Administered 2015-07-27: 0.63 mg via RESPIRATORY_TRACT
  Filled 2015-07-26: qty 3

## 2015-07-26 MED ORDER — PREDNISONE 20 MG PO TABS
40.0000 mg | ORAL_TABLET | Freq: Every day | ORAL | Status: DC
  Administered 2015-07-27: 40 mg via ORAL
  Filled 2015-07-26: qty 2

## 2015-07-26 MED ORDER — WARFARIN SODIUM 4 MG PO TABS
4.0000 mg | ORAL_TABLET | Freq: Once | ORAL | Status: AC
  Administered 2015-07-26: 4 mg via ORAL
  Filled 2015-07-26: qty 1

## 2015-07-26 NOTE — Care Management Note (Signed)
Case Management Note  Patient Details  Name: Brenda Wells MRN: [id number] Date of Birth: 26-Jan-1941  Subjective/Objective:    Pt lives with dtr-in-law who is out of the home 5-6 hrs daily during the week.  [facility], [facility], OT [facility].  PT/OT evals pending.                        Expected Discharge Plan:  Home w Home Health Services  Discharge planning Services  CM Consult  Post Acute Care Choice:  Resumption of Svcs/PTA Provider   HH Arranged:  RN, PT, OT    [facility]  Status of Service:  In process, will continue to follow  [name], [name], RN 07/26/2015, 1:46 PM

## 2015-07-26 NOTE — Progress Notes (Addendum)
[facility] TEAM 1 - Stepdown/ICU TEAM PROGRESS NOTE  Brenda Wells:[id number] DOB: July 10, 1941 DOA: 07/21/2015 PCP: [name], MD  Admit HPI / Brief Narrative: 74 y.o. F Hx HTN, CHF, COPD on 3 L O2 at home, and Atrial Fibrillation who was brought to the ER due to shortness of breath. Patient stated her symptoms had been gradually worsening with productive cough over 1 week. Chest x-ray noted cardiomegaly with pericardial effusion and COPD. Patient was also noticed to have elevated heart rate in A. Fib.  HPI/Subjective: The patient states her breathing is much improved but not yet back to her baseline.  She denies chest pain nausea vomiting or abdominal pain.  She did note today when she attempts to get up that she rapidly became severely dyspneic.  Assessment/Plan:  Acute respiratory failure with hypoxia  -Most likely multifactorial to include COPD exacerbation, CHF - C each issue discussed below  Acute COPD exacerbation -No systemic symptoms to suggest a true pneumonia but patient does have symptoms of an acute bronchitis - continue antibiotics as treatment for acute COPD exacerbation -Wean steroids as able -Continue usual nebulizer therapies -Follow dyspnea with ambulation  Chronic A. 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 Regional wall motion abnormalities cannot be excluded. 11/3 CT chest PE protocol negative PE. -Small bilateral  pleural effusions.  -Partial right middle lobe volume loss with atelectasis. -Centrilobular Emphysema.  Antibiotics: Azithromycin 11/2 > 11/7 Ceftriaxone 11/3>11/4 Ceftin 11/5 >   DVT prophylaxis: warfarin  Objective: Blood pressure 114/63, pulse 95, temperature 98.5 F (36.9 C), temperature source Oral, resp. rate 25, height  (1.651 m), weight 61.8 kg (136 lb 3.9 oz), SpO2 96 %.  Intake/Output Summary (Last 24 hours) at 07/26/15 1553 Last data filed at 07/26/15 1254  Gross per 24 hour  Intake    843 ml  Output   2000 ml  Net  -1157 ml   Exam: General: No acute respiratory distress at rest  Lungs: poor air movement throughout all fields with no active wheeze  Cardiovascular: Regular rate without murmur gallop or rub normal S1 and S2 Abdomen: Nontender, nondistended, soft, bowel sounds positive, no rebound, no ascites, no appreciable mass Extremities: No significant cyanosis,  or clubbing;  Trace edema bilateral lower extremities  Data Reviewed: Basic Metabolic Panel:  Recent Labs Lab 07/22/15 0745 07/23/15 0454 07/24/15 0502 07/25/15 0200 07/26/15 0314  NA 139 137 138 139 138  K 3.8 3.9 3.6 4.0 4.5  CL 95* 93* 95* 94* 95*  CO2 35* 32 33* 36* 36*  GLUCOSE 192* 175* 184* 156* 159*  BUN 12 17 22* 23* 26*  CREATININE 0.76 0.84 0.88 1.03* 0.96  CALCIUM 8.5* 8.5* 8.0* 8.2* 8.2*  MG 1.6* 1.8 1.8 1.9 2.1    CBC:  Recent Labs Lab 07/22/15 0745 07/23/15 0454 07/24/15 0502 07/25/15 0200 07/26/15 0314  WBC 7.7 12.7* 14.0* 12.9* 14.0*  NEUTROABS 6.2 10.9* 11.6* 10.0* 10.8*  HGB 8.7* 8.6* 8.5* 8.8* 9.1*  HCT 28.4* 27.3* 28.8* 29.1* 30.6*  MCV 83.3 83.2 85.0 84.8 84.5  PLT 481* 472* 475* 467* 432*    Liver Function Tests:  Recent Labs Lab 07/22/15 0745 07/23/15 0454 07/24/15 0502 07/25/15 0200 07/26/15 0314  AST 19 17 20 23 21   ALT 15 16 15 17 21   ALKPHOS 55 54 51 48 51  BILITOT 0.3 0.2* 0.2* 0.3 0.4  PROT 5.1* 5.3* 4.9* 5.4* 5.3*  ALBUMIN 2.2*  2.2* 2.1* 2.2* 2.4*    Coags:  Recent Labs Lab 07/22/15 0745 07/23/15 0454 07/24/15 0502 07/25/15 0200 07/26/15 0314  INR 2.18* 2.10* 1.79* 2.00* 1.74*    Cardiac Enzymes:  Recent Labs Lab 07/22/15 0046 07/22/15 0745 07/22/15 1213  TROPONINI <0.03 <0.03 <0.03    Recent Results (from the past 240 hour(s))  MRSA PCR Screening     Status: None   Collection Time: 07/21/15 11:15 PM  Result Value Ref Range Status   MRSA by PCR NEGATIVE NEGATIVE Final    Comment:        The GeneXpert MRSA Assay (FDA approved for NASAL specimens only), is one component of a comprehensive MRSA colonization surveillance program. It is not intended to diagnose MRSA infection nor to guide or monitor treatment for MRSA infections.   Respiratory virus panel     Status: None   Collection Time: 07/23/15  1:30 PM  Result Value Ref Range Status   Respiratory Syncytial Virus A Negative Negative Final   Respiratory Syncytial Virus B Negative Negative Final   Influenza A Negative Negative Final   Influenza B Negative Negative Final   Parainfluenza 1 Negative Negative Final   Parainfluenza 2 Negative Negative Final   Parainfluenza 3 Negative Negative Final   Metapneumovirus Negative Negative Final   Rhinovirus Negative Negative Final   Adenovirus Negative Negative Final    Comment: (NOTE) Performed At: [facility] [address] [place], [place] [id number] Mila HomerHancock William F MD WU:[id number]:[phone]      Studies:   Recent x-ray studies have been reviewed in detail by the Attending Physician  Scheduled Meds:  Scheduled Meds: . antiseptic oral rinse  7 mL Mouth Rinse BID  . arformoterol  15 mcg Nebulization BID  . azithromycin  500 mg Oral Daily  . cefUROXime  500 mg Oral BID WC  . diltiazem  180 mg Oral Daily  . furosemide  40 mg Oral Daily  . pantoprazole  40 mg Oral Daily  . predniSONE  50 mg Oral Q breakfast  . sodium chloride  3 mL Intravenous Q12H  . warfarin  4 mg Oral  ONCE-1800  . Warfarin - Pharmacist Dosing Inpatient   Does not apply q1800    Time spent on care of this patient: 35 mins   [facility] St MaryMCCLUNG,Camira Geidel T , MD   [facility] Office  [phone] Pager - Text Page per Amion as per below:  On-Call/Text Page:      [email or web address]      password [email or web address]  If 7PM-7AM, please contact night-coverage [email or web address] Password [email or web address] 07/26/2015, 3:53 PM   LOS: 5 days

## 2015-07-26 NOTE — Progress Notes (Signed)
ANTICOAGULATION CONSULT NOTE - Follow Up Consult  Pharmacy Consult for Coumadin Indication: atrial fibrillation  No Known Allergies  Patient Measurements: Height: 5\' 5"  (165.1 cm) Weight: 136 lb 3.9 oz (61.8 kg) IBW/kg (Calculated) : 57   Vital Signs: Temp: 98.3 F (36.8 C) (11/07 0800) Temp Source: Oral (11/07 0800) BP: 121/75 mmHg (11/07 1000) Pulse Rate: 86 (11/07 1000)  Labs:  Recent Labs  07/24/15 0502 07/25/15 0200 07/26/15 0314  HGB 8.5* 8.8* 9.1*  HCT 28.8* 29.1* 30.6*  PLT 475* 467* 432*  LABPROT 20.7* 22.6* 20.3*  INR 1.79* 2.00* 1.74*  CREATININE 0.88 1.03* 0.96    Estimated Creatinine Clearance: 47 mL/min (by C-G formula based on Cr of 0.96).   Assessment: 74yo female with AFib.  INR below goal today at 1.74.  Hg stable, pltc high.  No bleeding noted.  Pt on antibiotics for COPD exacerbation.  Goal of Therapy:  INR 2-3 Monitor platelets by anticoagulation protocol: Yes   Plan:  Coumadin 4 mg po x 1 dose today Continue daily INR Watch for s/s of bleeding  Herby AbrahamMichelle T. Cadyn Rodger, Pharm.D. [phone] 07/26/2015 10:48 AM

## 2015-07-27 LAB — COMPREHENSIVE METABOLIC PANEL
ALT: 18 U/L (ref 14–54)
ANION GAP: 10 (ref 5–15)
AST: 24 U/L (ref 15–41)
Albumin: 2.2 g/dL — ABNORMAL LOW (ref 3.5–5.0)
Alkaline Phosphatase: 46 U/L (ref 38–126)
BILIRUBIN TOTAL: 0.4 mg/dL (ref 0.3–1.2)
BUN: 20 mg/dL (ref 6–20)
CHLORIDE: 94 mmol/L — AB (ref 101–111)
CO2: 34 mmol/L — ABNORMAL HIGH (ref 22–32)
Calcium: 7.9 mg/dL — ABNORMAL LOW (ref 8.9–10.3)
Creatinine, Ser: 0.82 mg/dL (ref 0.44–1.00)
Glucose, Bld: 183 mg/dL — ABNORMAL HIGH (ref 65–99)
POTASSIUM: 4 mmol/L (ref 3.5–5.1)
Sodium: 138 mmol/L (ref 135–145)
TOTAL PROTEIN: 5.2 g/dL — AB (ref 6.5–8.1)

## 2015-07-27 LAB — MAGNESIUM: MAGNESIUM: 1.9 mg/dL (ref 1.7–2.4)

## 2015-07-27 LAB — CBC WITH DIFFERENTIAL/PLATELET
BASOS ABS: 0 10*3/uL (ref 0.0–0.1)
Basophils Relative: 0 %
EOS PCT: 0 %
Eosinophils Absolute: 0 10*3/uL (ref 0.0–0.7)
HCT: 28.5 % — ABNORMAL LOW (ref 36.0–46.0)
HEMOGLOBIN: 8.5 g/dL — AB (ref 12.0–15.0)
LYMPHS PCT: 21 %
Lymphs Abs: 2.7 10*3/uL (ref 0.7–4.0)
MCH: 25 pg — ABNORMAL LOW (ref 26.0–34.0)
MCHC: 29.8 g/dL — ABNORMAL LOW (ref 30.0–36.0)
MCV: 83.8 fL (ref 78.0–100.0)
Monocytes Absolute: 0.5 10*3/uL (ref 0.1–1.0)
Monocytes Relative: 4 %
NEUTROS PCT: 75 %
Neutro Abs: 9.6 10*3/uL — ABNORMAL HIGH (ref 1.7–7.7)
PLATELETS: 416 10*3/uL — AB (ref 150–400)
RBC: 3.4 MIL/uL — AB (ref 3.87–5.11)
RDW: 17.6 % — ABNORMAL HIGH (ref 11.5–15.5)
WBC: 12.8 10*3/uL — AB (ref 4.0–10.5)

## 2015-07-27 LAB — PROTIME-INR
INR: 2.01 — AB (ref 0.00–1.49)
PROTHROMBIN TIME: 22.7 s — AB (ref 11.6–15.2)

## 2015-07-27 MED ORDER — PREDNISONE 20 MG PO TABS: 20.0000 mg | ORAL_TABLET | Freq: Every day | ORAL | Status: DC

## 2015-07-27 MED ORDER — CEFUROXIME AXETIL 500 MG PO TABS: 500.0000 mg | ORAL_TABLET | Freq: Two times a day (BID) | ORAL | Status: DC

## 2015-07-27 MED ORDER — DILTIAZEM HCL ER COATED BEADS 180 MG PO CP24: 180.0000 mg | ORAL_CAPSULE | Freq: Every day | ORAL | Status: DC

## 2015-07-27 NOTE — Care Management Important Message (Signed)
Important Message  Patient Details  Name: Harlow Asadna G Kane MRN: [id number] Date of Birth: [birth year]   Medicare Important Message Given:  Yes-second notification given    [name], Oliviagrace Crisanti [name] 07/27/2015, 10:17 AM

## 2015-07-27 NOTE — Care Management Note (Signed)
Case Management Note  Patient Details  Name: Brenda Wells MRN: [id number] Date of Birth: [birth year]  Subjective/Objective:    Pt states she is going to move in with her son because he can provide 24/7 assistance.  [facility] notified of discharge and they will resume services, discharge summary faxed.                         Expected Discharge Plan:  Home w Home Health Services  Discharge planning Services  CM Consult  Post Acute Care Choice:  Resumption of Svcs/PTA Provider  HH Arranged:  [facility] PT, [facility]  Status of Service:  Completed, signed off  Medicare Important Message Given:  Yes-second notification given   [name], Hesston Hitchens T, RN 07/27/2015, 11:39 AM

## 2015-07-27 NOTE — Progress Notes (Signed)
SATURATION QUALIFICATIONS: (This note is used to comply with regulatory documentation for home oxygen)  Patient Saturations on Room Air at Rest = 86%  Patient Saturations on Room Air while Ambulating = 83%  Patient Saturations on 3 Liters of oxygen while Ambulating = 93%  Please briefly explain why patient needs home oxygen: Patient became short of breath and tachycardic without oxygen while ambulating 30 feet in hallway.

## 2015-07-27 NOTE — Evaluation (Signed)
Physical Therapy Evaluation Patient Details Name: Brenda Wells Weekly MRN: [id number] DOB: 1941/06/23 Today's Date: 07/27/2015   History of Present Illness  74 y.o. F Hx HTN, CHF, COPD on 3 L O2 at home, and Atrial Fibrillation who was brought to the ER due to shortness of breath. Patient stated her symptoms had been gradually worsening with productive cough over 1 week. Chest x-ray noted cardiomegaly with pericardial effusion and COPD. Patient was also noticed to have elevated heart rate in A. Fib.  Clinical Impression  Pt admitted with above diagnosis. Pt currently with functional limitations due to the deficits listed below (see PT Problem List). Pt able to transfer several times to different surfaces with good judgment as to her energy level each transfer.  [facility].  Going to move in with son.  Will follow acutely.  Pt will benefit from skilled PT to increase their independence and safety with mobility to allow discharge to the venue listed below.    Follow Up Recommendations Home health PT;Supervision/Assistance - 24 hour (HHOT and HHRN)    Equipment Recommendations  3in1 (PT)    Recommendations for Other Services       Precautions / Restrictions Precautions Precautions: Fall Restrictions Weight Bearing Restrictions: No      Mobility  Bed Mobility Overal bed mobility: Independent                Transfers Overall transfer level: Needs assistance Equipment used: None Transfers: Sit to/from Stand;Stand Pivot Transfers Sit to Stand: Supervision;Min guard Stand pivot transfers: Supervision;Min guard       General transfer comment: Pt demonstrated ability to transfer stand pivot from bed to a chairwith seated rest break to catch breath and then to 3N1.  then back from 3N1 to chair to bed with seated rest breaks of about 2-3 min each.  Pt was able to judge her fatigue level well as her O2 sat would recover and then she should move  again.    Ambulation/Gait                Stairs            Wheelchair Mobility    Modified Rankin (Stroke Patients Only)       Balance Overall balance assessment: Needs assistance Sitting-balance support: No upper extremity supported;Feet supported Sitting balance-Leahy Scale: Fair     Standing balance support: During functional activity;Single extremity supported Standing balance-Leahy Scale: Poor Standing balance comment: Pt reaching for armrests of chair and 3N1 during transfer.  Used them to assist her.                               Pertinent Vitals/Pain Pain Assessment: No/denies pain  86-88% on 3LO2 with activity but would return quickly >90% once resting.      Home Living Family/patient expects to be discharged to:: Private residence Living Arrangements: Children;Other (Comment) (daughter in law currently) Available Help at Discharge: Family;Available 24 hours/day (son can provide 24 hours so pt to move in with him) Type of Home: House Home Access: Level entry     Home Layout: One level Home Equipment: Walker - 2 wheels;Shower seat (home O2)      Prior Function Level of Independence: Needs assistance   Gait / Transfers Assistance Needed: min guard assist at times with transfers  ADL's / Homemaking Assistance Needed: assist  Hand Dominance        Extremity/Trunk Assessment   Upper Extremity Assessment: Defer to OT evaluation           Lower Extremity Assessment: Generalized weakness      Cervical / Trunk Assessment: Normal  Communication   Communication: No difficulties  Cognition Arousal/Alertness: Awake/alert Behavior During Therapy: WFL for tasks assessed/performed Overall Cognitive Status: Within Functional Limits for tasks assessed                      General Comments      Exercises        Assessment/Plan    PT Assessment Patient needs continued PT services  PT Diagnosis Generalized  weakness   PT Problem List Decreased balance;Decreased activity tolerance;Decreased mobility;Cardiopulmonary status limiting activity;Decreased knowledge of precautions;Decreased safety awareness;Decreased knowledge of use of DME  PT Treatment Interventions DME instruction;Gait training;Functional mobility training;Therapeutic activities;Therapeutic exercise;Balance training;Patient/family education   PT Goals (Current goals can be found in the Care Plan section) Acute Rehab PT Goals Patient Stated Goal: to get better PT Goal Formulation: With patient Time For Goal Achievement: 08/10/15 Potential to Achieve Goals: Good    Frequency Min 3X/week   Barriers to discharge        Co-evaluation               End of Session Equipment Utilized During Treatment: Gait belt;Oxygen Activity Tolerance: Patient limited by fatigue Patient left: in bed;with call bell/phone within reach Nurse Communication: Mobility status         Time: 1308-6578 PT Time Calculation (min) (ACUTE ONLY): 17 min   Charges:   PT Evaluation $Initial PT Evaluation Tier I: 1 Procedure     PT Wells CodesBerline Lopes Aug 04, 2015, 12:29 PM [facility] Acute Rehabilitation [phone] (pager)

## 2015-07-27 NOTE — Discharge Summary (Addendum)
Physician Discharge Summary  Brenda Wells:[id number] DOB: Apr 22, 1941 DOA: 07/21/2015  PCP: [name], MD  Admit date: 07/21/2015 Discharge date: 07/27/2015  Time spent: 40 minutes  Recommendations for Outpatient Follow-up:  Acute respiratory failure with hypoxia  -Most likely multifactorial to include COPD exacerbation, CHF -Brovana BID -Discharge on prednisone 20 mg daily, will allow Dr. Shan Levans [facility]) to titrate further.  -Flutter valve  -Follow-up with Dr. Shan Levans [facility]) in 2-3 weeks acute on chronic respiratory failure with hypoxia, COPD exacerbation  COPD exacerbation/ CAP -Centrilobular Emphysema. -See acute respiratory failure with hypoxia -complete 7 days of antibiotics  A. fib with RVR (chads 2 vasc score is 3.) -Rate controlled.  -Cardizem 180 mg daily -Coumadin per pharmacy, INR currently therapeutic (2.1)  Systolic CHF -EF slightly decreased -Strict in and out since admission;- 7.8 L -Daily weight; 11/8 weight= 61 0.1 kg (accuracy?) -Continue Lasix 40 mg daily -Patient aware self daily and recording journal -Establish care with [facility] cardiology Dr. Bryan Lemma in 3-4 weeks systolic CHF, A. fib with RVR  DVT unspecified laterality -Coumadin per pharmacy -CT angiogram negative for PE -Follow-up with Dr. [name] in 1-2 weeks for acute on chronic respiratory failure with hypoxia, acute systolic CHF   Discharge Diagnoses:  Principal Problem:   Acute respiratory failure with hypoxia (HCC) Active Problems:   COPD exacerbation (HCC)   Atrial fibrillation with RVR (HCC)   CHF (congestive heart failure) (HCC)   History of DVT (deep vein thrombosis)   Anemia   Acute systolic congestive heart failure (HCC)   Shortness of breath   CAP (community acquired pneumonia)   Discharge Condition: Stable  Diet recommendation: Heart healthy  Filed Weights   07/25/15 0452 07/26/15 0448 07/27/15 0724  Weight: 61 kg (134 lb 7.7  oz) 61.8 kg (136 lb 3.9 oz) 61.1 kg (134 lb 11.2 oz)    History of present illness:  74 y.o. WF PMHx HTN, CHF, COPD on 3 L O2 at home, Acute Respiratory Failure, Atrial Fibrillation   Brought to the ER the patient was lying position shortness of breath. Patient states her symptoms started last week and has been gradually worsening with productive cough denies any chest pain. A shows initially placed on BiPAP in the ER. Chest x-ray shows cardiomegaly with pericardial effusion and COPD findings. On exam patient's chest is tight. Patient was taken off BiPAP. Patient states she was admitted last month Brenda Wells for COPD exacerbation. Patient has quit smoking 2 months ago. Patient has been admitted for COPD exacerbation. Denies any gain in weight. Patient is also noticed to have elevated heart rate and patient has history of A. fib. During his hospitalization patient was treated for acute respiratory failure with hypoxia secondary to COPD exacerbation/CAP, and chronic systolic CHF poorly controlled. Patient responded to appropriate CAP, as well as diuresis. Patient now back to baseline.    Procedure/Significant Events: 11/3 Echocardiogram;- LVEF=50%-55%. Regional wall motion abnormalities cannot be excluded. 11/3 CT chest PE protocol; negative PE. -Small bilateral pleural effusions.  -Partial right middle lobe volume loss with atelectasis. -Centrilobular Emphysema.   Culture 11/2 MRSA by PCR negative 11/3 influenza influenza A/B/H1N1 negative 11/3 respiratory virus pending 11/3 sputum pending   Antibiotics: Azithromycin 11/3>> Ceftriaxone 11/3>> stopped 11/5 Ceftin 11/5>>  DVT prophylaxis: Coumadin     Discharge Exam: Filed Vitals:   07/27/15 0757 07/27/15 0758 07/27/15 0800 07/27/15 1000  BP: 113/54 113/54 114/64 103/73  Pulse:  122 94 78  Temp:  97.7 F (36.5  C)    TempSrc:      Resp:  28 30 32  Height:      Weight:      SpO2:  91% 94% 96%    General: A/O 4, acute on  chronic respiratory distress Eyes: Negative headache, negative scleral hemorrhage ENT: Negative Runny nose,negative gingival bleeding, Neck: Negative scars, masses, torticollis, lymphadenopathy, JVD Lungs: Continued diffuse poor air movement in all lung fields , negative expiratory wheezing, negative crackles  Cardiovascular: regular rhythm and rate, without murmur gallop or rub normal S1 and S2  Discharge Instructions     Medication List    STOP taking these medications        tiotropium 18 MCG inhalation capsule  Commonly known as:  SPIRIVA      TAKE these medications        cefUROXime 500 MG tablet  Commonly known as:  CEFTIN  Take 1 tablet (500 mg total) by mouth 2 (two) times daily with a meal.     diltiazem 180 MG 24 hr capsule  Commonly known as:  CARTIA XT  Take 1 capsule (180 mg total) by mouth daily.     Fluticasone-Salmeterol 500-50 MCG/DOSE Aepb  Commonly known as:  ADVAIR  Inhale 1 puff into the lungs 2 (two) times daily.     furosemide 40 MG tablet  Commonly known as:  LASIX  Take 40 mg by mouth daily.     ipratropium-albuterol 0.5-2.5 (3) MG/3ML Soln  Commonly known as:  DUONEB  Take 3 mLs by nebulization 4 (four) times daily.     pantoprazole 40 MG tablet  Commonly known as:  PROTONIX  Take 40 mg by mouth daily.     predniSONE 20 MG tablet  Commonly known as:  DELTASONE  Take 1 tablet (20 mg total) by mouth daily with breakfast.  Start taking on:  07/28/2015     warfarin 3 MG tablet  Commonly known as:  COUMADIN  Take 3 mg by mouth daily.       No Known Allergies Follow-up Information    Follow up with Dr. [name]. Schedule an appointment as soon as possible for a visit in 1 week.   Why:  Follow-up with Dr. [name] in 1-2 weeks for acute on chronic respiratory failure with hypoxia, acute systolic CHF   Contact information:   [address] [place] [address] [phone] Appointment August 03, 2015 @  11:00am      Schedule an appointment as soon as possible for a visit in 2 weeks to follow up.      Follow up with Bensimhon, Daniel, MD. Schedule an appointment as soon as possible for a visit in 3 weeks.   Specialty:  Cardiology   Why:  Establish care with [facility] cardiology in 3-4 weeks systolic CHF, A. fib with RVR   Contact information:   [address] [place] [address] [phone]       Follow up with Dr. Shan Levans. Schedule an appointment as soon as possible for a visit in 2 weeks.   Why:  Schedule an appoinment for 2 to 3 weeks for Chronic Respiratory failure with hypoxia, acute systolic CHf   Contact information:   [address] [place], [place] [address]. [phone] Appointment August 17, 2015 @ 9:00am       The results of significant diagnostics from this hospitalization (including imaging, microbiology, ancillary and laboratory) are listed below for reference.  Significant Diagnostic Studies: Ct Angio Chest Pe W/cm &/or Wo Cm  07/22/2015  CLINICAL DATA:  New diagnosis of DVT last month. Elevated D-dimer and onset of shortness of Breath. COPD exacerbation. Congestive heart failure. EXAM: CT ANGIOGRAPHY CHEST WITH CONTRAST TECHNIQUE: Multidetector CT imaging of the chest was performed using the standard protocol during bolus administration of intravenous contrast. Multiplanar CT image reconstructions and MIPs were obtained to evaluate the vascular anatomy. CONTRAST:  100mL OMNIPAQUE IOHEXOL 350 MG/ML SOLN COMPARISON:  07/21/2015 plain film.  06/02/2015 CT. FINDINGS: Mediastinum/Nodes: The quality of this exam for evaluation of pulmonary embolism is good. No evidence of pulmonary embolism. Aortic and branch vessel atherosclerosis. Mild cardiomegaly with lipomatous hypertrophy of the interatrial septum. Multivessel coronary artery atherosclerosis. Pulmonary artery enlargement, with the outflow tract measuring 3.4 cm. Prominent mediastinal nodes are  not pathologic by size criteria. No hilar adenopathy. Prominent thoracic duct. Lungs/Pleura: New small bilateral pleural effusions. Moderate centrilobular emphysema. Minimal degradation secondary to motion. The left arm is also not raised above the head. Right middle lobe volume loss with subsegmental atelectasis is new since the prior CT. No central obstructive lesion. Left lower lobe subsegmental atelectasis or scar is slightly increased since the prior CT. Upper abdomen: Left hepatic lobe cyst. Normal imaged portions of the spleen, stomach, adrenal glands. Musculoskeletal: No acute osseous abnormality. Review of the MIP images confirms the above findings. IMPRESSION: 1.  No evidence of pulmonary embolism. 2. New small bilateral pleural effusions. Partial right middle lobe volume loss with atelectasis. No obstructive central lesion or cause identified. 3.  Atherosclerosis, including within the coronary arteries. 4. Pulmonary artery enlargement suggests pulmonary arterial hypertension. 5. Centrilobular emphysema. 6. Mild degradation. Electronically Signed   By: Jeronimo GreavesKyle  Talbot M.D.   On: 07/22/2015 19:34   Dg Chest Port 1 View  07/21/2015  CLINICAL DATA:  Worsening shortness of breath for 1 week. History of COPD, CHF EXAM: PORTABLE CHEST 1 VIEW COMPARISON:  07/08/2015 FINDINGS: There is hyperinflation of the lungs compatible with COPD. There is cardiomegaly. Small left pleural effusion. Stable right perihilar bandlike density which likely reflects atelectasis. No overt edema. No acute bony abnormality. IMPRESSION: Cardiomegaly. Stable bandlike atelectasis in the right mid lung. Small left pleural effusion. COPD. Electronically Signed   By: Charlett NoseKevin  Dover M.D.   On: 07/21/2015 16:57    Microbiology: Recent Results (from the past 240 hour(s))  MRSA PCR Screening     Status: None   Collection Time: 07/21/15 11:15 PM  Result Value Ref Range Status   MRSA by PCR NEGATIVE NEGATIVE Final    Comment:        The  GeneXpert MRSA Assay (FDA approved for NASAL specimens only), is one component of a comprehensive MRSA colonization surveillance program. It is not intended to diagnose MRSA infection nor to guide or monitor treatment for MRSA infections.   Respiratory virus panel     Status: None   Collection Time: 07/23/15  1:30 PM  Result Value Ref Range Status   Respiratory Syncytial Virus A Negative Negative Final   Respiratory Syncytial Virus B Negative Negative Final   Influenza A Negative Negative Final   Influenza B Negative Negative Final   Parainfluenza 1 Negative Negative Final   Parainfluenza 2 Negative Negative Final   Parainfluenza 3 Negative Negative Final   Metapneumovirus Negative Negative Final   Rhinovirus Negative Negative Final   Adenovirus Negative Negative Final    Comment: (NOTE) Performed At: [facility] [address] [place], [place] [id number]  Mila Homer MD UJ:[id number]      Labs: Basic Metabolic Panel:  Recent Labs Lab 07/23/15 0454 07/24/15 0502 07/25/15 0200 07/26/15 0314 07/27/15 0320  NA 137 138 139 138 138  K 3.9 3.6 4.0 4.5 4.0  CL 93* 95* 94* 95* 94*  CO2 32 33* 36* 36* 34*  GLUCOSE 175* 184* 156* 159* 183*  BUN 17 22* 23* 26* 20  CREATININE 0.84 0.88 1.03* 0.96 0.82  CALCIUM 8.5* 8.0* 8.2* 8.2* 7.9*  MG 1.8 1.8 1.9 2.1 1.9   Liver Function Tests:  Recent Labs Lab 07/23/15 0454 07/24/15 0502 07/25/15 0200 07/26/15 0314 07/27/15 0320  AST 17 20 23 21 24   ALT 16 15 17 21 18   ALKPHOS 54 51 48 51 46  BILITOT 0.2* 0.2* 0.3 0.4 0.4  PROT 5.3* 4.9* 5.4* 5.3* 5.2*  ALBUMIN 2.2* 2.1* 2.2* 2.4* 2.2*   No results for input(s): LIPASE, AMYLASE in the last 168 hours. No results for input(s): AMMONIA in the last 168 hours. CBC:  Recent Labs Lab 07/23/15 0454 07/24/15 0502 07/25/15 0200 07/26/15 0314 07/27/15 0320  WBC 12.7* 14.0* 12.9* 14.0* 12.8*  NEUTROABS 10.9* 11.6* 10.0* 10.8* 9.6*  HGB 8.6* 8.5* 8.8* 9.1* 8.5*   HCT 27.3* 28.8* 29.1* 30.6* 28.5*  MCV 83.2 85.0 84.8 84.5 83.8  PLT 472* 475* 467* 432* 416*   Cardiac Enzymes:  Recent Labs Lab 07/22/15 0046 07/22/15 0745 07/22/15 1213  TROPONINI <0.03 <0.03 <0.03   BNP: BNP (last 3 results)  Recent Labs  07/21/15 1638  BNP 102.0*    ProBNP (last 3 results) No results for input(s): PROBNP in the last 8760 hours.  CBG: No results for input(s): GLUCAP in the last 168 hours.     Signed:  Carolyne Littles, MD [facility] [phone] pager

## 2015-07-27 NOTE — Progress Notes (Signed)
OT Cancellation Note  Patient Details Name: Brenda Wells MRN: [id number] DOB: 1941/03/18   Cancelled Treatment:    Reason Eval/Treat Not Completed: OT screened, no acute needs identified, pt is awaiting discharge home with son who will provide 24 hour assist.  HHOT ordered, acute OT will sign off.  Angelene GiovanniConarpe, Edris Friedt M  Lain Tetterton [place], OTR/L [phone]  07/27/2015, 3:26 PM

## 2015-08-17 ENCOUNTER — Ambulatory Visit: Payer: Medicare Other | Admitting: Physician Assistant

## 2015-08-17 ENCOUNTER — Inpatient Hospital Stay: Payer: Medicare Other | Admitting: Family Medicine

## 2015-08-20 ENCOUNTER — Inpatient Hospital Stay: Payer: Medicare Other | Admitting: Family Medicine

## 2015-08-20 ENCOUNTER — Encounter: Payer: Medicare Other | Admitting: Physician Assistant

## 2015-08-20 DIAGNOSIS — Z7901 Long term (current) use of anticoagulants: Secondary | ICD-10-CM | POA: Insufficient documentation

## 2015-08-20 DIAGNOSIS — R0989 Other specified symptoms and signs involving the circulatory and respiratory systems: Secondary | ICD-10-CM

## 2015-08-20 NOTE — Progress Notes (Deleted)
No show

## 2015-09-01 ENCOUNTER — Encounter: Payer: Self-pay | Admitting: Physician Assistant

## 2015-09-02 ENCOUNTER — Emergency Department (HOSPITAL_COMMUNITY): Payer: Medicare Other

## 2015-09-02 ENCOUNTER — Inpatient Hospital Stay (HOSPITAL_COMMUNITY)
Admission: EM | Admit: 2015-09-02 | Discharge: 2015-09-07 | DRG: 190 | Disposition: A | Discharge status: Home Health | Payer: Medicare Other | Attending: Internal Medicine | Admitting: Internal Medicine

## 2015-09-02 ENCOUNTER — Encounter (HOSPITAL_COMMUNITY): Payer: Self-pay | Admitting: Emergency Medicine

## 2015-09-02 DIAGNOSIS — K5900 Constipation, unspecified: Secondary | ICD-10-CM | POA: Diagnosis present

## 2015-09-02 DIAGNOSIS — J9622 Acute and chronic respiratory failure with hypercapnia: Secondary | ICD-10-CM | POA: Diagnosis present

## 2015-09-02 DIAGNOSIS — Z9981 Dependence on supplemental oxygen: Secondary | ICD-10-CM | POA: Diagnosis not present

## 2015-09-02 DIAGNOSIS — I4891 Unspecified atrial fibrillation: Secondary | ICD-10-CM | POA: Diagnosis present

## 2015-09-02 DIAGNOSIS — J9801 Acute bronchospasm: Secondary | ICD-10-CM | POA: Diagnosis present

## 2015-09-02 DIAGNOSIS — F17210 Nicotine dependence, cigarettes, uncomplicated: Secondary | ICD-10-CM | POA: Diagnosis present

## 2015-09-02 DIAGNOSIS — Z7901 Long term (current) use of anticoagulants: Secondary | ICD-10-CM

## 2015-09-02 DIAGNOSIS — Z825 Family history of asthma and other chronic lower respiratory diseases: Secondary | ICD-10-CM | POA: Diagnosis not present

## 2015-09-02 DIAGNOSIS — I5032 Chronic diastolic (congestive) heart failure: Secondary | ICD-10-CM | POA: Diagnosis present

## 2015-09-02 DIAGNOSIS — J441 Chronic obstructive pulmonary disease with (acute) exacerbation: Secondary | ICD-10-CM | POA: Diagnosis present

## 2015-09-02 DIAGNOSIS — R100 Acute abdomen: Secondary | ICD-10-CM

## 2015-09-02 DIAGNOSIS — K219 Gastro-esophageal reflux disease without esophagitis: Secondary | ICD-10-CM | POA: Diagnosis present

## 2015-09-02 DIAGNOSIS — R339 Retention of urine, unspecified: Secondary | ICD-10-CM | POA: Diagnosis present

## 2015-09-02 DIAGNOSIS — R079 Chest pain, unspecified: Secondary | ICD-10-CM

## 2015-09-02 DIAGNOSIS — Z66 Do not resuscitate: Secondary | ICD-10-CM | POA: Diagnosis present

## 2015-09-02 DIAGNOSIS — R0602 Shortness of breath: Secondary | ICD-10-CM | POA: Diagnosis present

## 2015-09-02 DIAGNOSIS — J9621 Acute and chronic respiratory failure with hypoxia: Secondary | ICD-10-CM | POA: Diagnosis present

## 2015-09-02 DIAGNOSIS — Z7951 Long term (current) use of inhaled steroids: Secondary | ICD-10-CM | POA: Diagnosis not present

## 2015-09-02 DIAGNOSIS — Z86718 Personal history of other venous thrombosis and embolism: Secondary | ICD-10-CM | POA: Diagnosis not present

## 2015-09-02 DIAGNOSIS — D649 Anemia, unspecified: Secondary | ICD-10-CM | POA: Diagnosis present

## 2015-09-02 DIAGNOSIS — I110 Hypertensive heart disease with heart failure: Secondary | ICD-10-CM | POA: Diagnosis present

## 2015-09-02 DIAGNOSIS — R319 Hematuria, unspecified: Secondary | ICD-10-CM | POA: Diagnosis present

## 2015-09-02 DIAGNOSIS — I482 Chronic atrial fibrillation, unspecified: Secondary | ICD-10-CM | POA: Diagnosis present

## 2015-09-02 DIAGNOSIS — Z7952 Long term (current) use of systemic steroids: Secondary | ICD-10-CM | POA: Diagnosis not present

## 2015-09-02 HISTORY — DX: Reserved for inherently not codable concepts without codable children: IMO0001

## 2015-09-02 HISTORY — DX: Personal history of urinary calculi: Z87.442

## 2015-09-02 LAB — BASIC METABOLIC PANEL
ANION GAP: 10 (ref 5–15)
BUN: 14 mg/dL (ref 6–20)
CALCIUM: 8.7 mg/dL — AB (ref 8.9–10.3)
CO2: 37 mmol/L — ABNORMAL HIGH (ref 22–32)
CREATININE: 0.86 mg/dL (ref 0.44–1.00)
Chloride: 92 mmol/L — ABNORMAL LOW (ref 101–111)
GFR calc Af Amer: >60 mL/min (ref >60)
GLUCOSE: 112 mg/dL — AB (ref 65–99)
Potassium: 3.3 mmol/L — ABNORMAL LOW (ref 3.5–5.1)
Sodium: 139 mmol/L (ref 135–145)

## 2015-09-02 LAB — URINALYSIS, ROUTINE W REFLEX MICROSCOPIC
Bilirubin Urine: NEGATIVE
Glucose, UA: 100 mg/dL — AB
Hgb urine dipstick: NEGATIVE
Ketones, ur: NEGATIVE mg/dL
LEUKOCYTES UA: NEGATIVE
NITRITE: NEGATIVE
PH: 5.5 (ref 5.0–8.0)
Protein, ur: NEGATIVE mg/dL
Specific Gravity, Urine: 1.013 (ref 1.005–1.030)

## 2015-09-02 LAB — I-STAT ARTERIAL BLOOD GAS, ED
Acid-Base Excess: 11 mmol/L — ABNORMAL HIGH (ref 0.0–2.0)
BICARBONATE: 37.2 meq/L — AB (ref 20.0–24.0)
O2 Saturation: 93 %
PO2 ART: 66 mmHg — AB (ref 80.0–100.0)
Patient temperature: 98.6
TCO2: 39 mmol/L (ref 0–100)
pCO2 arterial: 56 mmHg — ABNORMAL HIGH (ref 35.0–45.0)
pH, Arterial: 7.431 (ref 7.350–7.450)

## 2015-09-02 LAB — CBC WITH DIFFERENTIAL/PLATELET
BASOS PCT: 0 %
Basophils Absolute: 0 10*3/uL (ref 0.0–0.1)
EOS ABS: 0 10*3/uL (ref 0.0–0.7)
Eosinophils Relative: 0 %
HCT: 33.5 % — ABNORMAL LOW (ref 36.0–46.0)
Hemoglobin: 9.7 g/dL — ABNORMAL LOW (ref 12.0–15.0)
LYMPHS ABS: 2.9 10*3/uL (ref 0.7–4.0)
Lymphocytes Relative: 21 %
MCH: 23.6 pg — ABNORMAL LOW (ref 26.0–34.0)
MCHC: 29 g/dL — AB (ref 30.0–36.0)
MCV: 81.5 fL (ref 78.0–100.0)
MONO ABS: 0.8 10*3/uL (ref 0.1–1.0)
Monocytes Relative: 6 %
NEUTROS PCT: 73 %
Neutro Abs: 10.3 10*3/uL — ABNORMAL HIGH (ref 1.7–7.7)
PLATELETS: 405 10*3/uL — AB (ref 150–400)
RBC: 4.11 MIL/uL (ref 3.87–5.11)
RDW: 18.3 % — AB (ref 11.5–15.5)
WBC: 14 10*3/uL — AB (ref 4.0–10.5)

## 2015-09-02 LAB — MAGNESIUM: MAGNESIUM: 1.7 mg/dL (ref 1.7–2.4)

## 2015-09-02 LAB — PROTIME-INR
INR: 1.29 (ref 0.00–1.49)
Prothrombin Time: 16.2 seconds — ABNORMAL HIGH (ref 11.6–15.2)

## 2015-09-02 LAB — INFLUENZA PANEL BY PCR (TYPE A & B)
H1N1 flu by pcr: NOT DETECTED
INFLAPCR: NEGATIVE
INFLBPCR: NEGATIVE

## 2015-09-02 LAB — MRSA PCR SCREENING: MRSA by PCR: POSITIVE — AB

## 2015-09-02 LAB — HEPARIN LEVEL (UNFRACTIONATED)
Heparin Unfractionated: 0.22 IU/mL — ABNORMAL LOW (ref 0.30–0.70)
Heparin Unfractionated: 0.5 IU/mL (ref 0.30–0.70)

## 2015-09-02 LAB — I-STAT TROPONIN, ED: Troponin i, poc: 0.02 ng/mL (ref 0.00–0.08)

## 2015-09-02 LAB — STREP PNEUMONIAE URINARY ANTIGEN: Strep Pneumo Urinary Antigen: NEGATIVE

## 2015-09-02 LAB — BRAIN NATRIURETIC PEPTIDE: B NATRIURETIC PEPTIDE 5: 64.9 pg/mL (ref 0.0–100.0)

## 2015-09-02 MED ORDER — AZITHROMYCIN 250 MG PO TABS: 250.0000 mg | ORAL_TABLET | Freq: Every day | ORAL | Status: DC

## 2015-09-02 MED ORDER — AZITHROMYCIN 250 MG PO TABS: 500.0000 mg | ORAL_TABLET | Freq: Every day | ORAL | Status: DC

## 2015-09-02 MED ORDER — TIOTROPIUM BROMIDE MONOHYDRATE 18 MCG IN CAPS
18.0000 ug | ORAL_CAPSULE | Freq: Every day | RESPIRATORY_TRACT | Status: DC
  Administered 2015-09-04 – 2015-09-07 (×4): 18 ug via RESPIRATORY_TRACT
  Filled 2015-09-02 (×2): qty 5

## 2015-09-02 MED ORDER — IPRATROPIUM BROMIDE 0.02 % IN SOLN
0.5000 mg | Freq: Four times a day (QID) | RESPIRATORY_TRACT | Status: DC
Start: 2015-09-02 — End: 2015-09-03
  Administered 2015-09-02 – 2015-09-03 (×4): 0.5 mg via RESPIRATORY_TRACT
  Filled 2015-09-02 (×4): qty 2.5

## 2015-09-02 MED ORDER — LEVALBUTEROL HCL 1.25 MG/0.5ML IN NEBU
1.2500 mg | INHALATION_SOLUTION | Freq: Four times a day (QID) | RESPIRATORY_TRACT | Status: DC
  Administered 2015-09-02 – 2015-09-03 (×3): 1.25 mg via RESPIRATORY_TRACT
  Filled 2015-09-02 (×3): qty 0.5

## 2015-09-02 MED ORDER — IPRATROPIUM-ALBUTEROL 0.5-2.5 (3) MG/3ML IN SOLN
3.0000 mL | Freq: Once | RESPIRATORY_TRACT | Status: AC
  Administered 2015-09-02: 3 mL via RESPIRATORY_TRACT
  Filled 2015-09-02: qty 3

## 2015-09-02 MED ORDER — LEVALBUTEROL HCL 1.25 MG/0.5ML IN NEBU: 1.2500 mg | INHALATION_SOLUTION | Freq: Four times a day (QID) | RESPIRATORY_TRACT | Status: DC

## 2015-09-02 MED ORDER — LEVALBUTEROL HCL 1.25 MG/0.5ML IN NEBU
1.2500 mg | INHALATION_SOLUTION | Freq: Four times a day (QID) | RESPIRATORY_TRACT | Status: DC
  Administered 2015-09-02 (×2): 1.25 mg via RESPIRATORY_TRACT
  Filled 2015-09-02 (×2): qty 0.5

## 2015-09-02 MED ORDER — WARFARIN SODIUM 5 MG PO TABS
5.0000 mg | ORAL_TABLET | Freq: Once | ORAL | Status: AC
  Administered 2015-09-02: 5 mg via ORAL
  Filled 2015-09-02 (×2): qty 1

## 2015-09-02 MED ORDER — IPRATROPIUM BROMIDE 0.02 % IN SOLN: 0.5000 mg | Freq: Four times a day (QID) | RESPIRATORY_TRACT | Status: DC

## 2015-09-02 MED ORDER — CHLORHEXIDINE GLUCONATE CLOTH 2 % EX PADS
6.0000 | MEDICATED_PAD | Freq: Every day | CUTANEOUS | Status: DC
  Administered 2015-09-05 – 2015-09-07 (×3): 6 via TOPICAL

## 2015-09-02 MED ORDER — WARFARIN - PHARMACIST DOSING INPATIENT: Freq: Every day | Status: DC

## 2015-09-02 MED ORDER — PANTOPRAZOLE SODIUM 40 MG IV SOLR
40.0000 mg | Freq: Two times a day (BID) | INTRAVENOUS | Status: DC
  Administered 2015-09-02 – 2015-09-03 (×3): 40 mg via INTRAVENOUS
  Filled 2015-09-02 (×3): qty 40

## 2015-09-02 MED ORDER — FUROSEMIDE 40 MG PO TABS: 40.0000 mg | ORAL_TABLET | Freq: Every day | ORAL | Status: DC

## 2015-09-02 MED ORDER — IPRATROPIUM BROMIDE 0.02 % IN SOLN
0.5000 mg | RESPIRATORY_TRACT | Status: DC
  Administered 2015-09-02: 0.5 mg via RESPIRATORY_TRACT
  Filled 2015-09-02: qty 2.5

## 2015-09-02 MED ORDER — DEXTROSE 5 % IV SOLN
1.0000 g | INTRAVENOUS | Status: DC
  Administered 2015-09-02 – 2015-09-06 (×5): 1 g via INTRAVENOUS
  Filled 2015-09-02 (×5): qty 10

## 2015-09-02 MED ORDER — MAGNESIUM SULFATE IN D5W 10-5 MG/ML-% IV SOLN
1.0000 g | Freq: Once | INTRAVENOUS | Status: AC
  Administered 2015-09-02: 1 g via INTRAVENOUS
  Filled 2015-09-02 (×2): qty 100

## 2015-09-02 MED ORDER — DM-GUAIFENESIN ER 30-600 MG PO TB12: 1.0000 | ORAL_TABLET | Freq: Two times a day (BID) | ORAL | Status: DC

## 2015-09-02 MED ORDER — HEPARIN (PORCINE) IN NACL 100-0.45 UNIT/ML-% IJ SOLN
1000.0000 [IU]/h | INTRAMUSCULAR | Status: DC
  Administered 2015-09-02: 800 [IU]/h via INTRAVENOUS
  Administered 2015-09-03: 1000 [IU]/h via INTRAVENOUS
  Filled 2015-09-02 (×2): qty 250

## 2015-09-02 MED ORDER — POTASSIUM CHLORIDE 20 MEQ/15ML (10%) PO SOLN
20.0000 meq | Freq: Once | ORAL | Status: AC
  Administered 2015-09-02: 20 meq via ORAL
  Filled 2015-09-02: qty 15

## 2015-09-02 MED ORDER — FUROSEMIDE 10 MG/ML IJ SOLN
20.0000 mg | Freq: Once | INTRAMUSCULAR | Status: AC
  Administered 2015-09-02: 20 mg via INTRAVENOUS
  Filled 2015-09-02: qty 2

## 2015-09-02 MED ORDER — CETYLPYRIDINIUM CHLORIDE 0.05 % MT LIQD
7.0000 mL | Freq: Two times a day (BID) | OROMUCOSAL | Status: DC
  Administered 2015-09-02 – 2015-09-07 (×10): 7 mL via OROMUCOSAL

## 2015-09-02 MED ORDER — DEXTROSE 5 % IV SOLN
500.0000 mg | INTRAVENOUS | Status: DC
  Administered 2015-09-03 – 2015-09-06 (×4): 500 mg via INTRAVENOUS
  Filled 2015-09-02 (×4): qty 500

## 2015-09-02 MED ORDER — ALBUTEROL (5 MG/ML) CONTINUOUS INHALATION SOLN
10.0000 mg/h | INHALATION_SOLUTION | Freq: Once | RESPIRATORY_TRACT | Status: AC
  Administered 2015-09-02: 10 mg/h via RESPIRATORY_TRACT
  Filled 2015-09-02: qty 20

## 2015-09-02 MED ORDER — ASPIRIN 81 MG PO CHEW
324.0000 mg | CHEWABLE_TABLET | Freq: Once | ORAL | Status: AC
  Administered 2015-09-02: 324 mg via ORAL
  Filled 2015-09-02: qty 4

## 2015-09-02 MED ORDER — DILTIAZEM HCL 100 MG IV SOLR
5.0000 mg/h | INTRAVENOUS | Status: DC
  Administered 2015-09-02: 7.5 mg/h via INTRAVENOUS
  Administered 2015-09-02 (×3): 12.5 mg/h via INTRAVENOUS
  Administered 2015-09-02: 5 mg/h via INTRAVENOUS
  Administered 2015-09-03: 12.5 mg/h via INTRAVENOUS
  Administered 2015-09-03: 15 mg/h via INTRAVENOUS
  Administered 2015-09-03: 12.5 mg/h via INTRAVENOUS
  Administered 2015-09-04: 15 mg/h via INTRAVENOUS
  Filled 2015-09-02 (×7): qty 100

## 2015-09-02 MED ORDER — METHYLPREDNISOLONE SODIUM SUCC 125 MG IJ SOLR
60.0000 mg | Freq: Three times a day (TID) | INTRAMUSCULAR | Status: DC
  Administered 2015-09-02 – 2015-09-03 (×4): 60 mg via INTRAVENOUS
  Filled 2015-09-02 (×4): qty 2

## 2015-09-02 MED ORDER — POTASSIUM CHLORIDE 10 MEQ/100ML IV SOLN
10.0000 meq | INTRAVENOUS | Status: AC
  Administered 2015-09-02 (×4): 10 meq via INTRAVENOUS
  Filled 2015-09-02 (×4): qty 100

## 2015-09-02 MED ORDER — PANTOPRAZOLE SODIUM 40 MG PO TBEC: 40.0000 mg | DELAYED_RELEASE_TABLET | Freq: Every day | ORAL | Status: DC

## 2015-09-02 MED ORDER — POTASSIUM CHLORIDE 20 MEQ/15ML (10%) PO SOLN: 40.0000 meq | Freq: Once | ORAL | Status: DC

## 2015-09-02 MED ORDER — DILTIAZEM HCL ER COATED BEADS 180 MG PO CP24: 180.0000 mg | ORAL_CAPSULE | Freq: Every day | ORAL | Status: DC

## 2015-09-02 MED ORDER — ALBUTEROL (5 MG/ML) CONTINUOUS INHALATION SOLN
15.0000 mg/h | INHALATION_SOLUTION | RESPIRATORY_TRACT | Status: DC
  Administered 2015-09-02: 15 mg/h via RESPIRATORY_TRACT
  Filled 2015-09-02: qty 20

## 2015-09-02 MED ORDER — MUPIROCIN 2 % EX OINT
1.0000 "application " | TOPICAL_OINTMENT | Freq: Two times a day (BID) | CUTANEOUS | Status: AC
  Administered 2015-09-02 – 2015-09-07 (×10): 1 via NASAL
  Filled 2015-09-02 (×4): qty 22

## 2015-09-02 NOTE — Progress Notes (Signed)
Placed patient on Bipap with IPAP set at 12cm and EPAP set at 6cm. Oxygen set at 40%

## 2015-09-02 NOTE — Progress Notes (Signed)
Albuterol continuous nebulizer started to deliver 15 mg/hr of Albuterol

## 2015-09-02 NOTE — Progress Notes (Addendum)
Patient seen and examined ,admitted for COPD exacerbation, on Bipap until noon, now stable off Bipap on 4L O2. Continue iv steroids, started on empiric abx,  Continue iv heparin for subtherapeutic INR, as pt is on coumadin Continue lasix for  Acute on Chronic diastolic (congestive) heart failure

## 2015-09-02 NOTE — Progress Notes (Signed)
ANTICOAGULATION CONSULT NOTE - Initial Consult  Pharmacy Consult for Heparin and Coumadin Indication: DVT and afib  No Known Allergies  Patient Measurements: Height: 5\' 5"  (165.1 cm) Weight: 129 lb (58.514 kg) IBW/kg (Calculated) : 57  Vital Signs: Temp: 97.9 F (36.6 C) (12/15 0014) Temp Source: Oral (12/15 0014) BP: 139/69 mmHg (12/15 0230) Pulse Rate: 55 (12/15 0230)  Labs:  Recent Labs  09/02/15 0104  HGB 9.7*  HCT 33.5*  PLT 405*  LABPROT 16.2*  INR 1.29  CREATININE 0.86    Estimated Creatinine Clearance: 52.4 mL/min (by C-G formula based on Cr of 0.86).   Medical History: Past Medical History  Diagnosis Date  . Acute respiratory failure (HCC)   . Community acquired pneumonia   . COPD with acute exacerbation (HCC)   . Anemia   . Hypokalemia   . Hyponatremia   . CHF (congestive heart failure) (HCC)   . Hypertension     Medications:  See electronic med rec  Assessment: 74 y.o. F presents with SOB. Pt on coumadin PTA for DVT (07/2015) and afib. Admit INR subtherapeutic (1.29). Home dose 3mg  daily - last dose 12/14. Hgb low but stable, plt elevated. Pt to continue coumadin and bridge with heparin until INR > 2.  Goal of Therapy:  Heparin level 0.3-0.7 units/ml  INR 2-3 Monitor platelets by anticoagulation protocol: Yes   Plan:  Coumadin 5mg  po tonight Heparin gtt at 800 units/hr Will f/u heparin level in 8 hours Daily heparin level and CBC and INR  Christoper Fabianaron Lynetta Tomczak, PharmD, BCPS Clinical pharmacist, pager [phone] 09/02/2015,3:08 AM

## 2015-09-02 NOTE — Progress Notes (Signed)
Initial Nutrition Assessment  DOCUMENTATION CODES:   Not applicable  INTERVENTION:    Diet advancement as respiratory status allows.  Ensure Enlive po BID once diet advanced, each supplement provides 350 kcal and 20 grams of protein.  NUTRITION DIAGNOSIS:   Increased nutrient needs related to chronic illness (COPD) as evidenced by estimated needs.  GOAL:   Patient will meet greater than or equal to 90% of their needs  MONITOR:   PO intake, Supplement acceptance, Diet advancement, Labs, Weight trends, I & O's  REASON FOR ASSESSMENT:   Consult Assessment of nutrition requirement/status  ASSESSMENT:   74 y.o. female with PMH of COPD on 4L oxygen at home, dCHF, DVT and atrial fibrillation on coumadin, hypertension, GERD, anemia, who presents with worsening shortness of breath.  Patient reports that she eats "everything I can get my hands on." She thinks she has lost a few pounds, but unsure why. She has a lot of loose skin on her arms and legs. Nutrition-focused physical exam completed. Findings are no fat depletion, mild-moderate muscle depletion, and no edema. She likes Ensure and Boost supplements, has had them in the past and would like one now because she is hungry. Patient currently NPO, requiring BiPAP.  Diet Order:  Diet NPO time specified  Skin:  Reviewed, no issues  Last BM:  unknown  Height:   Ht Readings from Last 1 Encounters:  09/02/15 5\' 5"  (1.651 m)    Weight:   Wt Readings from Last 1 Encounters:  09/02/15 131 lb 14.4 oz (59.829 kg)    Ideal Body Weight:  56.8 kg  BMI:  Body mass index is 21.95 kg/(m^2).  Estimated Nutritional Needs:   Kcal:  1400-1600  Protein:  70-90 gm  Fluid:  1.5 L  EDUCATION NEEDS:   No education needs identified at this time  Joaquin CourtsKimberly Annette Liotta, RD, LDN, CNSC Pager [phone] After Hours Pager [phone]

## 2015-09-02 NOTE — ED Notes (Signed)
The paitent has had SOB for three days.  The patient does have COPD and CHF.  She was thinking she was going to get better but has gotten worse.   The patient said she has not felt well and thought she would get over it but she has not urinated but a cap full today so she called EMS.  According to EMS, her abdomen is distended but she does not have pedal edema.  She did get an IV and received 125mg  of Solumedrol and a duoneb.  The patient has had duoneb at home with no relief.  EMS advised when they arrived she was tripoding trying to breathe.

## 2015-09-02 NOTE — H&P (Signed)
[facility] History and Physical  Brenda Asadna G Wickard BJY:[id number] DOB: 05/19/1941 DOA: 09/02/2015  Referring physician: ED physician PCP: [name],[name], MD  Specialists:   Chief Complaint: Worsening shortness of breath  HPI: Brenda Wells is a 74 y.o. female with PMH of COPD on 4L oxygen at home, dCHF, DVT and atrial fibrillation on coumadin, hypertension, GERD, anemia, who presents with worsening shortness of breath.  Patient reports that she has been having shortness of breath in the past several days, which has been progressively getting worse. She has dry cough and wheezing. No chest pain, fever or chills. She denies runny nose or sore throat. She does not have leg edema. No abdominal pain, diarrhea, UTI, lateral weakness. She states that she has been compliant to her Lasix and Coumadin.  In ED, patient was found to have  WBC 14.0, temperature normal, tachycardia, tachypnea, potassium 3.3, renal function okay, INR 1.29, troponin negative, pending urinalysis and BNP. CXR showed emphysematous change, no pneumonia. Patient's committed to inpatient for further eval and treatment.  Where does patient live?   At home    Can patient participate in ADLs? Some   Review of Systems:   General: no fevers, chills, has poor appetite, has fatigue HEENT: no blurry vision, hearing changes or sore throat Pulm: has dyspnea, coughing, wheezing CV: no chest pain, palpitations Abd: no nausea, vomiting, abdominal pain, diarrhea, constipation GU: no dysuria, burning on urination, increased urinary frequency, hematuria  Ext: no leg edema Neuro: no unilateral weakness, numbness, or tingling, no vision change or hearing loss Skin: no rash MSK: No muscle spasm, no deformity, no limitation of range of movement in spin Heme: No easy bruising.  Travel history: No recent long distant travel.  Allergy: No Known Allergies  Past Medical History  Diagnosis Date  . Acute respiratory failure (HCC)   .  Community acquired pneumonia   . COPD with acute exacerbation (HCC)   . Anemia   . Hypokalemia   . Hyponatremia   . CHF (congestive heart failure) (HCC)   . Hypertension     Past Surgical History  Procedure Laterality Date  . No past surgeries      Social History:  reports that she has quit smoking. She does not have any smokeless tobacco history on file. She reports that she does not drink alcohol. Her drug history is not on file.  Family History:  Family History  Problem Relation Age of Onset  . Diabetes Mellitus II Mother   . CAD Mother   . CAD Brother      Prior to Admission medications   Medication Sig Start Date End Date Taking? Authorizing Provider  diltiazem (CARTIA XT) 180 MG 24 hr capsule Take 1 capsule (180 mg total) by mouth daily. 07/27/15  Yes [name], MD  Fluticasone-Salmeterol (ADVAIR) 500-50 MCG/DOSE AEPB Inhale 1 puff into the lungs 2 (two) times daily.   Yes Historical Provider, MD  furosemide (LASIX) 40 MG tablet Take 40 mg by mouth daily.   Yes Historical Provider, MD  ipratropium-albuterol (DUONEB) 0.5-2.5 (3) MG/3ML SOLN Take 3 mLs by nebulization 4 (four) times daily.   Yes Historical Provider, MD  pantoprazole (PROTONIX) 40 MG tablet Take 40 mg by mouth daily.   Yes Historical Provider, MD  predniSONE (DELTASONE) 20 MG tablet Take 1 tablet (20 mg total) by mouth daily with breakfast. 07/28/15  Yes [name], MD  SPIRIVA HANDIHALER 18 MCG inhalation capsule Place 18 mcg into inhaler and inhale daily.  08/24/15  Yes Historical Provider, MD  warfarin (COUMADIN) 3 MG tablet Take 3 mg by mouth daily.   Yes Historical Provider, MD  cefUROXime (CEFTIN) 500 MG tablet Take 1 tablet (500 mg total) by mouth 2 (two) times daily with a meal. Patient not taking: Reported on 09/02/2015 07/27/15   Drema Dallas, MD    Physical Exam: Filed Vitals:   09/02/15 0145 09/02/15 0157 09/02/15 0215 09/02/15 0230  BP: 127/91  131/72 139/69  Pulse: 112 28 56 55   Temp:      TempSrc:      Resp: Height:      Weight:      SpO2: 99%  92% 99%   General: In moderate acute distress HEENT:       Eyes: PERRL, EOMI, no scleral icterus.       ENT: No discharge from the ears and nose, no pharynx injection, no tonsillar enlargement.        Neck: No JVD, no bruit, no mass felt. Heme: No neck lymph node enlargement. Cardiac: S1/S2, RRR, No murmurs, No gallops or rubs. Pulm: has diffused wheezing bilaterally, No rales or rubs. Abd: Soft, nondistended, nontender, no rebound pain, no organomegaly, BS present. Ext: No pitting leg edema bilaterally. 2+DP/PT pulse bilaterally. Musculoskeletal: No joint deformities, No joint redness or warmth, no limitation of ROM in spin. Skin: No rashes.  Neuro: Alert, oriented X3, cranial nerves II-XII grossly intact, muscle strength 5/5 in all extremities, sensation to light touch intact.  Psych: Patient is not psychotic, no suicidal or hemocidal ideation.  Labs on Admission:  Basic Metabolic Panel:  Recent Labs Lab 09/02/15 0104  NA 139  K 3.3*  CL 92*  CO2 37*  GLUCOSE 112*  BUN 14  CREATININE 0.86  CALCIUM 8.7*   Liver Function Tests: No results for input(s): AST, ALT, ALKPHOS, BILITOT, PROT, ALBUMIN in the last 168 hours. No results for input(s): LIPASE, AMYLASE in the last 168 hours. No results for input(s): AMMONIA in the last 168 hours. CBC:  Recent Labs Lab 09/02/15 0104  WBC 14.0*  NEUTROABS 10.3*  HGB 9.7*  HCT 33.5*  MCV 81.5  PLT 405*   Cardiac Enzymes: No results for input(s): CKTOTAL, CKMB, CKMBINDEX, TROPONINI in the last 168 hours.  BNP (last 3 results)  Recent Labs  07/21/15 1638 09/02/15 0124  BNP 102.0* 64.9    ProBNP (last 3 results) No results for input(s): PROBNP in the last 8760 hours.  CBG: No results for input(s): GLUCAP in the last 168 hours.  Radiological Exams on Admission: Dg Chest Port 1 View  09/02/2015  CLINICAL DATA:  Shortness of breath  today.  History of COPD. EXAM: PORTABLE CHEST 1 VIEW COMPARISON:  CT chest 07/22/2015.  Chest 07/21/2015. FINDINGS: Mild cardiac enlargement. Pulmonary vascularity is normal. Emphysematous changes in the lungs. Atelectasis in the lung bases. Fluid or thickened pleura in the left costophrenic angle. No focal consolidation. No pneumothorax. Mediastinal contours appear intact. Calcified aorta. IMPRESSION: Cardiac enlargement. Emphysematous changes in the lungs. Small pleural effusion or pleural thickening on the left. No focal airspace disease or consolidation in the lungs. Electronically Signed   By: Burman Nieves M.D.   On: 09/02/2015 01:17    EKG: Independently reviewed. QTC 481, A. fib with RVR  Assessment/Plan Principal Problem:   Acute on chronic respiratory failure with hypoxia (HCC) Active Problems:   COPD exacerbation (HCC)   Atrial fibrillation with RVR (HCC)   History of  DVT (deep vein thrombosis)   Anemia   Shortness of breath   Chronic anticoagulation - Coumadin, CHADS2VASC=4   GERD (gastroesophageal reflux disease)   Chronic diastolic (congestive) heart failure (HCC)   Acute on chronic respiratory failure with hypoxia (HCC): most likely due COPD exacerbation given diffused wheezing. Less likely to have a CHF exacerbation given no any leg edema. Pt is acute respiratory distress. Patient does not have chest pain or tenderness over calf areas, less likely to have PE.  -will admit to SDU -Nebulizers: scheduled Atrovent q2h and prn xopenex -Solu-Medrol 60 mg IV tid  -Oral azithromycin for 5 days.  -Mucinex for cough  -Urine S. pneumococcal antigen -Follow up blood culture x2, sputum culture, Flu pcr -BiPAP -check ABG  Atrial Fibrillation with RVR: CHA2DS2-VASc Score is 4 , needs oral anticoagulation. Patient is on Coumadin, but INR is 1.29 on admission.  -will start IV heparin bridging since pt also has hx of DVT -continue coumadin per pharmacy -IV cardizem gtt -continue  home oral diltiazem  History of DVT (deep vein thrombosis): -see above  Anemia: hgb stable. hgb 8.5 on 07/27/15-->9.7. -f/u by CBC  GERD: -Protonix  Chronic diastolic (congestive) heart failure (HCC): Echo 07/22/15 showed a year for 50-55%. Patient is taking Lasix to 40 mg daily. No leg edema. CHF is compensated. -continue Lasix  DVT ppx: On IV Heparin and coumadin  Code Status: Partial code [facility] with CPR, but no intubation) Family Communication: None at bed side. Disposition Plan: Admit to inpatient   Date of Service 09/02/2015    Lorretta Harp [facility] Pager [phone]  If 7PM-7AM, please contact night-coverage [email or web address] Password [email or web address] 09/02/2015, 3:19 AM

## 2015-09-02 NOTE — Progress Notes (Signed)
Removed patient from Bipap and placed her on home regimen oxygen at 3lpm. Dr.Niu notified of change and agrees.

## 2015-09-02 NOTE — ED Notes (Signed)
Pt in resp distress, wheezes and crackles audible from doorway. resp therapy and Dr. Fayrene FearingJames notified.

## 2015-09-02 NOTE — ED Notes (Signed)
Attempted to call report

## 2015-09-02 NOTE — Progress Notes (Signed)
ANTICOAGULATION CONSULT NOTE  Pharmacy Consult for Heparin Indication: DVT and afib  No Known Allergies  Patient Measurements: Height: 5\' 5"  (165.1 cm) Weight: 131 lb 14.4 oz (59.829 kg) IBW/kg (Calculated) : 57  Vital Signs: Temp: 97.9 F (36.6 C) (12/15 1527) Temp Source: Oral (12/15 1527) BP: 140/85 mmHg (12/15 1527) Pulse Rate: 110 (12/15 1527)  Labs:  Recent Labs  09/02/15 0104 09/02/15 1209 09/02/15 1921  HGB 9.7*  --   --   HCT 33.5*  --   --   PLT 405*  --   --   LABPROT 16.2*  --   --   INR 1.29  --   --   HEPARINUNFRC  --  0.22* 0.50  CREATININE 0.86  --   --     Estimated Creatinine Clearance: 52.4 mL/min (by C-G formula based on Cr of 0.86).   Medical History: Past Medical History  Diagnosis Date  . Acute respiratory failure (HCC)   . Community acquired pneumonia   . COPD with acute exacerbation (HCC)   . Anemia   . Hypokalemia   . Hyponatremia   . CHF (congestive heart failure) (HCC)   . Hypertension   . Shortness of breath dyspnea   . History of kidney stones     Medications:  See electronic med rec  Assessment: 74 y.o. F presents with SOB. Pt on coumadin PTA for DVT (07/2015) and afib. Admit INR subtherapeutic (1.29). Home dose 3mg  daily - last dose 12/14. Hgb low but stable, plt elevated. Initial HL subtherapeutic, but PM HL 0.5 now in goal.   Goal of Therapy:  Heparin level 0.3-0.7 units/ml  INR 2-3 Monitor platelets by anticoagulation protocol: Yes    Plan:  Continue IV heparin at 1000 units/hr Daily HL and CBC   Edon Hoadley S. Merilynn Finlandobertson, PharmD, [facility] Clinical Staff Pharmacist Pager [phone]  09/02/2015 8:17 PM

## 2015-09-02 NOTE — Plan of Care (Signed)
Problem: Phase I Progression Outcomes Goal: O2 sats > or equal 90% or at baseline Outcome: Completed/Met Date Met:  09/02/15 Patient now off bipap and back on home level of 02 (4L [place]).  Sats maintaining 98-100%.  Patient denies feeling SOB

## 2015-09-02 NOTE — Progress Notes (Signed)
ANTICOAGULATION CONSULT NOTE  Pharmacy Consult for Heparin and Coumadin Indication: DVT and afib  No Known Allergies  Patient Measurements: Height: 5\' 5"  (165.1 cm) Weight: 131 lb 14.4 oz (59.829 kg) IBW/kg (Calculated) : 57  Vital Signs: Temp: 98 F (36.7 C) (12/15 1003) Temp Source: Axillary (12/15 1003) BP: 111/62 mmHg (12/15 1003) Pulse Rate: 118 (12/15 1003)  Labs:  Recent Labs  09/02/15 0104 09/02/15 1209  HGB 9.7*  --   HCT 33.5*  --   PLT 405*  --   LABPROT 16.2*  --   INR 1.29  --   HEPARINUNFRC  --  0.22*  CREATININE 0.86  --     Estimated Creatinine Clearance: 52.4 mL/min (by C-G formula based on Cr of 0.86).   Medical History: Past Medical History  Diagnosis Date  . Acute respiratory failure (HCC)   . Community acquired pneumonia   . COPD with acute exacerbation (HCC)   . Anemia   . Hypokalemia   . Hyponatremia   . CHF (congestive heart failure) (HCC)   . Hypertension   . Shortness of breath dyspnea   . History of kidney stones     Medications:  See electronic med rec  Assessment: 74 y.o. F presents with SOB. Pt on coumadin PTA for DVT (07/2015) and afib. Admit INR subtherapeutic (1.29). Home dose 3mg  daily - last dose 12/14. Hgb low but stable, plt elevated. Initial HL subtherapeutic   Goal of Therapy:  Heparin level 0.3-0.7 units/ml  INR 2-3 Monitor platelets by anticoagulation protocol: Yes    Plan:  Coumadin 5mg  po tonight Increase heparin gtt to 1000 units/hr Will f/u heparin level in 8 hours Daily heparin level and CBC and INR Dc heparin when INR > 2     Agapito GamesAlison Cathern Tahir, PharmD, BCPS Clinical Pharmacist Pager: [phone] 09/02/2015 1:30 PM

## 2015-09-02 NOTE — ED Provider Notes (Signed)
By signing my name below, I, [name], attest that this documentation has been prepared under the direction and in the presence of Enbridge EnergyKristen N Ward, DO. Electronically Signed: [name], ED Scribe. 09/02/2015. 1:03 AM.  TIME SEEN: 12:49 AM  CHIEF COMPLAINT: Shortness of Breath  HPI: Brenda Wells is a 74 y.o. female with a medical hx of COPD, CHF, who presents to the Emergency Department via EMS complaining of progressively worsening SOB onset 3 days. she notes that she is easily tired with movement. She notes that she is unsure if this is an exacerbation of her COPD or CHF. Pt received IV 125 mg of Solumedrol and a duoneb by EMS PTA.  She notes that she has tried duoneb at home with no relief of her symptoms. She denies fever and any other symptoms. She has had a dry cough. Also reports bandlike chest tightness. No radiation of pain.  She notes that she takes lasix for her CHF. She states that she currently smokes 1-2 cigarettes daily. She states that she wears 4 liters of O2 at home. She states that she has blood clots in her legs currently and she takes warfarin for it. She denies having blood clots in her lungs. She states that she has had her flu shot and pneumonia shot in the past.    Pt PCP: Dr. [place]ott [place]isco [name]   ROS: See HPI Constitutional: no fever  Eyes: no drainage  ENT: no runny nose   Cardiovascular:  chest pain  Resp:  SOB  GI: no vomiting GU: no dysuria Integumentary: no rash  Allergy: no hives  Musculoskeletal: no leg swelling  Neurological: no slurred speech ROS otherwise negative  PAST MEDICAL HISTORY/PAST SURGICAL HISTORY:  Past Medical History  Diagnosis Date  . Acute respiratory failure (HCC)   . Community acquired pneumonia   . COPD with acute exacerbation (HCC)   . Anemia   . Hypokalemia   . Hyponatremia   . CHF (congestive heart failure) (HCC)   . Hypertension     MEDICATIONS:  Prior to Admission medications   Medication Sig  Start Date End Date Taking? Authorizing Provider  cefUROXime (CEFTIN) 500 MG tablet Take 1 tablet (500 mg total) by mouth 2 (two) times daily with a meal. 07/27/15   [name], MD  diltiazem (CARTIA XT) 180 MG 24 hr capsule Take 1 capsule (180 mg total) by mouth daily. 07/27/15   [name], MD  Fluticasone-Salmeterol (ADVAIR) 500-50 MCG/DOSE AEPB Inhale 1 puff into the lungs 2 (two) times daily.    Historical Provider, MD  furosemide (LASIX) 40 MG tablet Take 40 mg by mouth daily.    Historical Provider, MD  ipratropium-albuterol (DUONEB) 0.5-2.5 (3) MG/3ML SOLN Take 3 mLs by nebulization 4 (four) times daily.    Historical Provider, MD  pantoprazole (PROTONIX) 40 MG tablet Take 40 mg by mouth daily.    Historical Provider, MD  predniSONE (DELTASONE) 20 MG tablet Take 1 tablet (20 mg total) by mouth daily with breakfast. 07/28/15   [name], MD  warfarin (COUMADIN) 3 MG tablet Take 3 mg by mouth daily.    Historical Provider, MD    ALLERGIES:  No Known Allergies  SOCIAL HISTORY:  Social History  Substance Use Topics  . Smoking status: Former Games developermoker  . Smokeless tobacco: Not on file  . Alcohol Use: No    FAMILY HISTORY: Family History  Problem Relation Age of Onset  . Diabetes Mellitus II Mother   .  CAD Mother   . CAD Brother     EXAM: BP 132/64 mmHg  Pulse 48  Temp(Src) 97.9 F (36.6 C) (Oral)  Resp 26  Ht  (1.651 m)  Wt 129 lb (58.514 kg)  BMI 21.47 kg/m2  SpO2 100% CONSTITUTIONAL: Alert and oriented and responds appropriately to questions. Elderly, chronically ill appearing in mild-moderate respiratory distress.  HEAD: Normocephalic EYES: Conjunctivae clear, PERRL ENT: normal nose; no rhinorrhea; moist mucous membranes; pharynx without lesions noted NECK: Supple, no meningismus, no LAD  CARD: regular with PVC's and tachycardic; S1 and S2 appreciated; no murmurs, no clicks, no rubs, no gallops RESP: mild to moderate respiratory distress tachypneic.  diminished breathe sounds at bases bilaterally. Diffuse expiratory wheezes. No rhonchi or rales. Can speak in short sentences with minimal movement. ABD/GI: Normal bowel sounds; non-distended; soft, non-tender, no rebound, no guarding, no peritoneal signs BACK:  The back appears normal and is non-tender to palpation, there is no CVA tenderness EXT: Normal ROM in all joints; non-tender to palpation; no edema; normal capillary refill; no cyanosis, no calf tenderness or swelling    SKIN: Normal color for age and race; warm NEURO: Moves all extremities equally, sensation to light touch intact diffusely, cranial nerves II through XII intact PSYCH: The patient's mood and manner are appropriate. Grooming and personal hygiene are appropriate.  MEDICAL DECISION MAKING: Patient here with likely COPD exacerbation. Less likely CHF given she does not appear volume overloaded on exam. Will obtain labs, chest x-ray. She received Solu-Medrol with EMS. We'll give continuous albuterol, Atrovent. Suspect her chest tightness is secondary to COPD. EKG shows sinus tachycardia with no new ischemic changes.  ED PROGRESS: Patient's labs show mild leukocytosis with left shift. She is afebrile. Patient's troponin is negative. INR subtherapeutic at 1.29. BNP 64.9. Chest x-ray shows cardiac enlargement with emphysematous changes of her lungs small pleural effusion on the left with no consolidation, infiltrate. No pneumothorax. Patient looks much better after continuous albuterol. Her tachypnea is improving and she has having better aeration but still having wheezing. We'll continue duo nebs. Will discuss with hospitalist for admission.    EKG Interpretation  Date/Time:  Thursday September 02 2015 00:16:44 EST Ventricular Rate:  123 PR Interval:  120 QRS Duration: 85 QT Interval:  336 QTC Calculation: 481 R Axis:   78 Text Interpretation:  Sinus tachycardia with premature supraventricular complexes Probable left atrial  enlargement Nonspecific T abnrm, anterolateral leads Confirmed by WARD,  DO, Brenda ([id number]) on 09/02/2015 12:51:48 AM      2:47 AM- Spoke with Dr. Clyde Lundborg and will admit for in-patient telemetry, will place hold orders per his request.    3:30 AM  Dr. Clyde Lundborg plans to admit patient to step down. He has placed her on BiPAP for work of breathing. ABG shows a well compensated gas.   CRITICAL CARE Performed by: Raelyn Number   Total critical care time: 40 minutes  Critical care time was exclusive of separately billable procedures and treating other patients.  Critical care was necessary to treat or prevent imminent or life-threatening deterioration.  Critical care was time spent personally by me on the following activities: development of treatment plan with patient and/or surrogate as well as nursing, discussions with consultants, evaluation of patient's response to treatment, examination of patient, obtaining history from patient or surrogate, ordering and performing treatments and interventions, ordering and review of laboratory studies, ordering and review of radiographic studies, pulse oximetry and re-evaluation of patient's condition.  I personally performed the services described in this documentation, which was scribed in my presence. The recorded information has been reviewed and is accurate.     Layla Maw Ward, DO 09/02/15 [phone]

## 2015-09-02 NOTE — Evaluation (Signed)
Physical Therapy Evaluation Patient Details Name: Brenda Wells MRN: [id number] DOB: 02-01-1941 Today's Date: 09/02/2015   History of Present Illness  Brenda Wells is a 74 y.o. female with PMH of COPD on 4L oxygen at home, dCHF, DVT and atrial fibrillation on coumadin, hypertension, GERD, anemia, who presents with worsening shortness of breath.  Clinical Impression  Pt admitted with/for worsening SOB.  Pt currently limited functionally due to the problems listed below.  (see problems list.)  Pt will benefit from PT to maximize function and safety to be able to get home safely with available assist of family.     Follow Up Recommendations Home health PT;Supervision/Assistance - 24 hour    Equipment Recommendations  None recommended by PT    Recommendations for Other Services       Precautions / Restrictions Precautions Precautions: Fall      Mobility  Bed Mobility Overal bed mobility: Modified Independent                Transfers Overall transfer level: Needs assistance   Transfers: Sit to/from Stand Sit to Stand: Min guard;Min assist;+2 safety/equipment (once fatigued and in distress, took more assist)            Ambulation/Gait Ambulation/Gait assistance: Min assist Ambulation Distance (Feet): 30 Feet (x2) Assistive device:  (iv pole) Gait Pattern/deviations: Step-through pattern Gait velocity: slow Gait velocity interpretation: Below normal speed for age/gender General Gait Details: generally steady initially until became in distress, then had to sit.  sats on 4L stayed at 92/93% level with EHR rising into the 120's and up to high 130's.  Returned to bed with assist and immediately got a breathing treatment.  Stairs            Wheelchair Mobility    Modified Rankin (Stroke Patients Only)       Balance Overall balance assessment: Needs assistance Sitting-balance support: No upper extremity supported Sitting balance-Leahy Scale: Fair        Standing balance-Leahy Scale: Fair                               Pertinent Vitals/Pain Pain Assessment: No/denies pain    Home Living Family/patient expects to be discharged to:: Private residence Living Arrangements: Children Available Help at Discharge: Family;Available 24 hours/day Type of Home: House Home Access: Level entry     Home Layout: One level Home Equipment: Walker - 2 wheels;Shower seat      Prior Function Level of Independence: Needs assistance   Gait / Transfers Assistance Needed: min guard assist at times with transfers  ADL's / Homemaking Assistance Needed: assist        Hand Dominance        Extremity/Trunk Assessment   Upper Extremity Assessment: Overall WFL for tasks assessed           Lower Extremity Assessment: Overall WFL for tasks assessed (mild proximal weakness)         Communication   Communication: No difficulties  Cognition Arousal/Alertness: Awake/alert Behavior During Therapy: WFL for tasks assessed/performed Overall Cognitive Status: Within Functional Limits for tasks assessed                      General Comments      Exercises        Assessment/Plan    PT Assessment Patient needs continued PT services  PT Diagnosis Difficulty walking;Other (comment) (decrease activity tolerance)  PT Problem List Decreased strength;Decreased activity tolerance;Decreased mobility;Cardiopulmonary status limiting activity  PT Treatment Interventions Gait training;Functional mobility training;Therapeutic activities;DME instruction;Patient/family education;Stair training   PT Goals (Current goals can be found in the Care Plan section) Acute Rehab PT Goals Patient Stated Goal: get my breathing better PT Goal Formulation: With patient Time For Goal Achievement: 09/09/15 Potential to Achieve Goals: Good    Frequency Min 3X/week   Barriers to discharge        Co-evaluation               End of  Session Equipment Utilized During Treatment: Oxygen Activity Tolerance: Patient limited by fatigue;Other (comment) (due to dyspnea and elevated HR) Patient left: in bed;with call bell/phone within reach;with nursing/sitter in room Nurse Communication: Mobility status         Time: [id number] PT Time Calculation (min) (ACUTE ONLY): 24 min   Charges:   PT Evaluation $Initial PT Evaluation Tier I: 1 Procedure PT Treatments $Therapeutic Activity: 8-22 mins   PT G Codes:        Dustin Burrill, Eliseo GumKenneth V 09/02/2015, 5:44 PM  09/02/2015  [place] BingKen Kobey Sides, PT [phone]  (pager)

## 2015-09-02 NOTE — ED Notes (Signed)
Pt denies productive cough at this time

## 2015-09-03 ENCOUNTER — Inpatient Hospital Stay (HOSPITAL_COMMUNITY): Payer: Medicare Other

## 2015-09-03 ENCOUNTER — Encounter (HOSPITAL_COMMUNITY): Payer: Self-pay | Admitting: Radiology

## 2015-09-03 DIAGNOSIS — J9621 Acute and chronic respiratory failure with hypoxia: Secondary | ICD-10-CM

## 2015-09-03 DIAGNOSIS — J441 Chronic obstructive pulmonary disease with (acute) exacerbation: Principal | ICD-10-CM

## 2015-09-03 LAB — COMPREHENSIVE METABOLIC PANEL
ALK PHOS: 60 U/L (ref 38–126)
ALT: 22 U/L (ref 14–54)
AST: 22 U/L (ref 15–41)
Albumin: 2.9 g/dL — ABNORMAL LOW (ref 3.5–5.0)
Anion gap: 9 (ref 5–15)
BILIRUBIN TOTAL: 0.3 mg/dL (ref 0.3–1.2)
BUN: 23 mg/dL — AB (ref 6–20)
CALCIUM: 8.5 mg/dL — AB (ref 8.9–10.3)
CO2: 32 mmol/L (ref 22–32)
CREATININE: 1.03 mg/dL — AB (ref 0.44–1.00)
Chloride: 94 mmol/L — ABNORMAL LOW (ref 101–111)
GFR calc Af Amer: >60 mL/min (ref >60)
GFR, EST NON AFRICAN AMERICAN: 53 mL/min — AB (ref >60)
GLUCOSE: 193 mg/dL — AB (ref 65–99)
POTASSIUM: 4.5 mmol/L (ref 3.5–5.1)
Sodium: 135 mmol/L (ref 135–145)
TOTAL PROTEIN: 6.1 g/dL — AB (ref 6.5–8.1)

## 2015-09-03 LAB — MAGNESIUM: Magnesium: 2 mg/dL (ref 1.7–2.4)

## 2015-09-03 LAB — LACTIC ACID, PLASMA: Lactic Acid, Venous: 2.9 mmol/L (ref 0.5–2.0)

## 2015-09-03 LAB — CBC
HCT: 28.4 % — ABNORMAL LOW (ref 36.0–46.0)
HEMATOCRIT: 30.1 % — AB (ref 36.0–46.0)
Hemoglobin: 8.5 g/dL — ABNORMAL LOW (ref 12.0–15.0)
Hemoglobin: 8.9 g/dL — ABNORMAL LOW (ref 12.0–15.0)
MCH: 24.3 pg — ABNORMAL LOW (ref 26.0–34.0)
MCH: 24.2 pg — AB (ref 26.0–34.0)
MCHC: 29.9 g/dL — AB (ref 30.0–36.0)
MCHC: 29.6 g/dL — ABNORMAL LOW (ref 30.0–36.0)
MCV: 81.1 fL (ref 78.0–100.0)
MCV: 81.8 fL (ref 78.0–100.0)
PLATELETS: 422 10*3/uL — AB (ref 150–400)
Platelets: 397 10*3/uL (ref 150–400)
RBC: 3.5 MIL/uL — ABNORMAL LOW (ref 3.87–5.11)
RBC: 3.68 MIL/uL — AB (ref 3.87–5.11)
RDW: 18.4 % — AB (ref 11.5–15.5)
RDW: 18.3 % — AB (ref 11.5–15.5)
WBC: 19.3 10*3/uL — ABNORMAL HIGH (ref 4.0–10.5)
WBC: 20.5 10*3/uL — ABNORMAL HIGH (ref 4.0–10.5)

## 2015-09-03 LAB — TYPE AND SCREEN
ABO/RH(D): A POS
ANTIBODY SCREEN: NEGATIVE

## 2015-09-03 LAB — HEPARIN LEVEL (UNFRACTIONATED): HEPARIN UNFRACTIONATED: 0.57 [IU]/mL (ref 0.30–0.70)

## 2015-09-03 LAB — PROTIME-INR
INR: 1.62 — AB (ref 0.00–1.49)
INR: 1.64 — ABNORMAL HIGH (ref 0.00–1.49)
PROTHROMBIN TIME: 19.3 s — AB (ref 11.6–15.2)
Prothrombin Time: 19.4 seconds — ABNORMAL HIGH (ref 11.6–15.2)

## 2015-09-03 LAB — ABO/RH: ABO/RH(D): A POS

## 2015-09-03 LAB — TROPONIN I: Troponin I: 0.06 ng/mL — ABNORMAL HIGH (ref <0.031)

## 2015-09-03 MED ORDER — MAGNESIUM SULFATE 2 GM/50ML IV SOLN
2.0000 g | Freq: Once | INTRAVENOUS | Status: AC
  Administered 2015-09-03: 2 g via INTRAVENOUS
  Filled 2015-09-03: qty 50

## 2015-09-03 MED ORDER — FUROSEMIDE 10 MG/ML IJ SOLN
20.0000 mg | Freq: Once | INTRAMUSCULAR | Status: AC
  Administered 2015-09-03: 20 mg via INTRAVENOUS
  Filled 2015-09-03: qty 2

## 2015-09-03 MED ORDER — MORPHINE SULFATE (PF) 2 MG/ML IV SOLN
1.0000 mg | INTRAVENOUS | Status: DC | PRN
  Administered 2015-09-04 (×2): 1 mg via INTRAVENOUS
  Filled 2015-09-03 (×2): qty 1

## 2015-09-03 MED ORDER — METHYLPREDNISOLONE SODIUM SUCC 125 MG IJ SOLR
60.0000 mg | Freq: Four times a day (QID) | INTRAMUSCULAR | Status: DC
  Administered 2015-09-03 – 2015-09-05 (×8): 60 mg via INTRAVENOUS
  Filled 2015-09-03 (×8): qty 2

## 2015-09-03 MED ORDER — IOHEXOL 300 MG/ML  SOLN
100.0000 mL | Freq: Once | INTRAMUSCULAR | Status: AC | PRN
  Administered 2015-09-03: 100 mL via INTRAVENOUS

## 2015-09-03 MED ORDER — PANTOPRAZOLE SODIUM 40 MG PO TBEC
40.0000 mg | DELAYED_RELEASE_TABLET | Freq: Every day | ORAL | Status: DC
Start: 2015-09-03 — End: 2015-09-07
  Administered 2015-09-04 – 2015-09-07 (×4): 40 mg via ORAL
  Filled 2015-09-03 (×5): qty 1

## 2015-09-03 MED ORDER — WARFARIN SODIUM 5 MG PO TABS: 5.0000 mg | ORAL_TABLET | Freq: Once | ORAL | Status: DC

## 2015-09-03 MED ORDER — BUDESONIDE 0.25 MG/2ML IN SUSP
0.2500 mg | Freq: Two times a day (BID) | RESPIRATORY_TRACT | Status: DC
  Administered 2015-09-03 – 2015-09-07 (×9): 0.25 mg via RESPIRATORY_TRACT
  Filled 2015-09-03 (×9): qty 2

## 2015-09-03 MED ORDER — FUROSEMIDE 10 MG/ML IJ SOLN
40.0000 mg | Freq: Once | INTRAMUSCULAR | Status: AC
  Administered 2015-09-03: 40 mg via INTRAVENOUS
  Filled 2015-09-03: qty 4

## 2015-09-03 MED ORDER — POTASSIUM CHLORIDE CRYS ER 20 MEQ PO TBCR
40.0000 meq | EXTENDED_RELEASE_TABLET | Freq: Once | ORAL | Status: AC
  Administered 2015-09-03: 40 meq via ORAL
  Filled 2015-09-03: qty 2

## 2015-09-03 MED ORDER — ALBUTEROL SULFATE (2.5 MG/3ML) 0.083% IN NEBU: 2.5000 mg | INHALATION_SOLUTION | RESPIRATORY_TRACT | Status: DC | PRN

## 2015-09-03 MED ORDER — IPRATROPIUM-ALBUTEROL 0.5-2.5 (3) MG/3ML IN SOLN
3.0000 mL | Freq: Four times a day (QID) | RESPIRATORY_TRACT | Status: AC
  Administered 2015-09-03 – 2015-09-04 (×4): 3 mL via RESPIRATORY_TRACT
  Filled 2015-09-03 (×4): qty 3

## 2015-09-03 MED ORDER — ARFORMOTEROL TARTRATE 15 MCG/2ML IN NEBU
15.0000 ug | INHALATION_SOLUTION | Freq: Two times a day (BID) | RESPIRATORY_TRACT | Status: DC
  Administered 2015-09-03 – 2015-09-07 (×9): 15 ug via RESPIRATORY_TRACT
  Filled 2015-09-03 (×9): qty 2

## 2015-09-03 MED ORDER — ONDANSETRON HCL 4 MG/2ML IJ SOLN
4.0000 mg | Freq: Four times a day (QID) | INTRAMUSCULAR | Status: DC | PRN
  Administered 2015-09-03 – 2015-09-04 (×2): 4 mg via INTRAVENOUS
  Filled 2015-09-03 (×2): qty 2

## 2015-09-03 MED ORDER — GUAIFENESIN ER 600 MG PO TB12
1200.0000 mg | ORAL_TABLET | Freq: Two times a day (BID) | ORAL | Status: DC
  Administered 2015-09-03 – 2015-09-07 (×9): 1200 mg via ORAL
  Filled 2015-09-03 (×9): qty 2

## 2015-09-03 NOTE — Care Management Note (Addendum)
Case Management Note  Patient Details  Name: Brenda Wells MRN: [id number] Date of Birth: Jun 11, 1941  Subjective/Objective:  Pt admitted for Acute on Chronic Resp Failure. Pt is from home and the plan will be to return home once stable.[facility]. CM did call Audry with [facility]- pt has RN services. Pt will need resumption orders and F2F once stable for d/c.                   Action/Plan: Per pt she will need DME 3n1 and WC. MD [facility] place [facility] and [facility] and pt's family is [facility]. No further needs from CM at this time.    Expected Discharge Date:                  Expected Discharge Plan:  Home w Home Health Services  In-House Referral:  NA  Discharge planning Services  CM Consult  Post Acute Care Choice:  Home Health, Resumption of Svcs/PTA Provider Choice offered to:  Patient  DME Arranged:  3-N-1, Wheelchair manual DME Agency:   [facility]  HH Arranged:  RN HH [facility]  Status of Service:  Completed, signed off  Medicare Important Message Given:    Date Medicare IM Given:    Medicare IM give by:    Date Additional Medicare IM Given:    Additional Medicare Important Message give by:     If discussed at Long Length of Stay Meetings, dates discussed:    Additional Comments: 1109 09-07-15 Tomi BambergerBrenda Graves-Bigelow, RN,BSN [phone] Pt [facility] once stable for d/c. [facility].- [facility] @ [phone]. 09-07-15 [facility]  [name], [name], RN 09/03/2015, 3:44 PM

## 2015-09-03 NOTE — Progress Notes (Signed)
UR Completed Hubert Derstine Graves-Bigelow, RN,BSN [phone]  

## 2015-09-03 NOTE — Progress Notes (Signed)
At approximately 01:50, assisted patient to bedside commode. She was able to void 350 ml without difficulty. Upon return to bed, patient quickly went into acute respiratory distress. O2 saturations dropped briskly to ~70% and patient clearly showing signs of hypoxia with generalized cyanosis and lethargy. Re-initiated BIPAP immediately and called for respiratory therapy assessment. At approximately five minutes, patient still with O2 saturations in the 70% range, then began to normalize.  Current vital signs: Filed Vitals:   09/03/15 0156 09/03/15 0157 09/03/15 0158 09/03/15 0159  BP:    113/55  Pulse: 108 105 99 100  Temp:      TempSrc:      Resp: 24 16 15 19   Height:      Weight:      SpO2: 92% 98% 99% 99%   Work of breathing is decreasing, but remains labored with accessory muscle use. Return of mental status to baseline.  Continuing to monitor closely.

## 2015-09-03 NOTE — Progress Notes (Signed)
I spoke with Dr. Susie CassetteAbrol about patients respiratory status. I informed her that although pts oxygen sats maintained between 88% on 4LNc, her work of breathing was quite labored with minimal exertion. I was calling to get recommendations. She stated she was gong to call the paitents Pulmonary MD for further recs. Pt stated she felt like she was going to need to pee soon and could only do it on the bedside commode. Patients respiratory status would not allow for that to happen at this point. I informed Dr. Susie CassetteAbrol of this as well and orders for foley cath received. Will closely monitor pt and her resp status to see if there is a change that would require Bipap or any other interventions.

## 2015-09-03 NOTE — Progress Notes (Signed)
ANTICOAGULATION CONSULT NOTE  Pharmacy Consult for Heparin Indication: DVT and afib  No Known Allergies  Patient Measurements: Height: 5\' 5"  (165.1 cm) Weight: 134 lb 3.2 oz (60.873 kg) IBW/kg (Calculated) : 57  Vital Signs: Temp: 98.1 F (36.7 C) (12/16 1220) Temp Source: Oral (12/16 1220) BP: 115/62 mmHg (12/16 1220) Pulse Rate: 104 (12/16 1220)  Labs:  Recent Labs  09/02/15 0104 09/02/15 1209 09/02/15 1921 09/03/15 0405 09/03/15 1122  HGB 9.7*  --   --  8.5*  --   HCT 33.5*  --   --  28.4*  --   PLT 405*  --   --  397  --   LABPROT 16.2*  --   --   --  19.3*  INR 1.29  --   --   --  1.62*  HEPARINUNFRC  --  0.22* 0.50 0.57  --   CREATININE 0.86  --   --   --   --     Estimated Creatinine Clearance: 52.4 mL/min (by C-G formula based on Cr of 0.86).   Medical History: Past Medical History  Diagnosis Date  . Acute respiratory failure (HCC)   . Community acquired pneumonia   . COPD with acute exacerbation (HCC)   . Anemia   . Hypokalemia   . Hyponatremia   . CHF (congestive heart failure) (HCC)   . Hypertension   . Shortness of breath dyspnea   . History of kidney stones     Medications:  See electronic med rec  Assessment: 74 y.o. F presents with SOB. Pt on coumadin PTA for DVT (07/2015) and afib. Admit INR subtherapeutic (1.29). Home dose 3mg  daily - last dose 12/14. Hgb low but stable, plt elevated. INR trending up to 1.62. HL therapeutic. Coumadin restarted yesterday.    Goal of Therapy:  Heparin level 0.3-0.7 units/ml  INR 2-3 Monitor platelets by anticoagulation protocol: Yes    Plan:   Continue IV heparin at 1000 units/hr Coumadin 5mg  PO x1 Daily HL, INR, and CBC  Ulyses SouthwardMinh Pham, PharmD Pager: [phone] 09/03/2015 1:06 PM

## 2015-09-03 NOTE — Evaluation (Signed)
Occupational Therapy Evaluation Patient Details Name: Brenda Wells Wroe MRN: [id number] DOB: 04/13/1941 Today's Date: 09/03/2015    History of Present Illness Brenda Wells Lentz is a 74 y.o. female with PMH of COPD on 4L oxygen at home, dCHF, DVT and atrial fibrillation on coumadin, hypertension, GERD, anemia, who presents with worsening shortness of breath.   Clinical Impression   Pt was independent in sponge bathing, toileting, and dressing prior to admission.  Her family performed meal prep and housekeeping for her.  Pt presents with anxiety and very poor activity tolerance.  Only agreeable to sitting EOB for a few minutes with OT. Will follow acutely. Began instruction in energy conservation strategies.    Follow Up Recommendations  Home health OT;Supervision/Assistance - 24 hour    Equipment Recommendations  3 in 1 bedside comode    Recommendations for Other Services       Precautions / Restrictions Precautions Precautions: Fall Precaution Comments: watch HR and sats      Mobility Bed Mobility Overal bed mobility: Modified Independent             General bed mobility comments: used rail, HOB up  Transfers                 General transfer comment: pt declined OOB    Balance     Sitting balance-Leahy Scale: Fair                                      ADL                                         General ADL Comments: Pt with poor activity tolerance, only agreeable to EOB sitting x 3 minutes, able to self feed and wash face/hands     Vision     Perception     Praxis      Pertinent Vitals/Pain Pain Assessment: No/denies pain     Hand Dominance Right   Extremity/Trunk Assessment Upper Extremity Assessment Upper Extremity Assessment: Generalized weakness (tremulous)   Lower Extremity Assessment Lower Extremity Assessment: Defer to PT evaluation       Communication Communication Communication: No  difficulties   Cognition Arousal/Alertness: Awake/alert Behavior During Therapy: Anxious Overall Cognitive Status: Within Functional Limits for tasks assessed                     General Comments       Exercises       Shoulder Instructions      Home Living Family/patient expects to be discharged to:: Private residence Living Arrangements: Children Available Help at Discharge: Family;Available PRN/intermittently Type of Home: House Home Access: Level entry     Home Layout: One level     Bathroom Shower/Tub: Producer, television/film/videoWalk-in shower   Bathroom Toilet: Standard     Home Equipment: Environmental consultantWalker - 2 wheels;Shower seat (02)          Prior Functioning/Environment Level of Independence: Needs assistance  Gait / Transfers Assistance Needed: min guard assist at times with transfers ADL's / Homemaking Assistance Needed: sponge bathes, toilets and dresses herself, assist for meal prep and housekeeping        OT Diagnosis: Generalized weakness   OT Problem List: Decreased strength;Decreased activity tolerance;Impaired balance (sitting and/or standing);Cardiopulmonary status limiting activity  OT Treatment/Interventions: Self-care/ADL training;Energy conservation;Therapeutic activities;Patient/family education    OT Goals(Current goals can be found in the care plan section) Acute Rehab OT Goals Patient Stated Goal: get my breathing better OT Goal Formulation: With patient Time For Goal Achievement: 09/17/15 Potential to Achieve Goals: Fair ADL Goals Pt Will Perform Grooming: sitting;with set-up Pt Will Perform Upper Body Bathing: with set-up;sitting Pt Will Perform Lower Body Bathing: with min assist;with adaptive equipment;sit to/from stand Pt Will Perform Upper Body Dressing: with set-up;sitting Pt Will Perform Lower Body Dressing: with supervision;sit to/from stand Pt Will Transfer to Toilet: with supervision;stand pivot transfer;bedside commode Pt Will Perform Toileting -  Clothing Manipulation and hygiene: with supervision;sit to/from stand Additional ADL Goal #1: Pt will utilize energy conservation strategies in ADL independently.  OT Frequency: Min 2X/week   Barriers to D/C: Decreased caregiver support          Co-evaluation              End of Session    Activity Tolerance: Treatment limited secondary to medical complications (Comment);Patient limited by fatigue (HR elevated to 121) Patient left: in bed;with call bell/phone within reach   Time: 1457-1515 OT Time Calculation (min): 18 min Charges:  OT General Charges $OT Visit: 1 Procedure OT Evaluation $Initial OT Evaluation Tier I: 1 Procedure Wells-Codes:    Evern Bio 09/03/2015, 3:35 PM

## 2015-09-03 NOTE — Progress Notes (Signed)
CRITICAL VALUE ALERT  Critical value received:  Lactic acid = 2.9  Date of notification:  09/03/15  Time of notification:  20:01  Critical value read back:Yes.    Nurse who received alert:  Annabell Howells. Rickie Gutierres, RN  MD notified (1st page):  Elray McgregorMary Lynch, NP for [facility]  Time of first page:  2002  MD notified (2nd page):  Time of second page:  Responding MD:  Elray McgregorMary Lynch, NP  Time MD responded:  2019  Notes:  Patient has been on and off BIPAP several times in the past 24 hours; in no acute distress at this time. Patient off BIPAP currently and tolerating O2 at 4L/m with O2 saturation 98-100%.. Vital signs nominal for patient. WBC counts trending upward with IV antibiotic therapy; rocephin, azithromycin. Currently receiving IV magnesium replacement therapy. CT ABD/Pelvis shows gaseous distention of stomach and small bowel; patient clearly distended with no complaint of pain. Hematuria has cleared at this point; likely trauma from Foley catheter insertion earlier today. Discussed case briefly with Elray McgregorMary Lynch, NP.  Recommended holding BIPAP related to gastric distention. Will encourage patient to take small sips of water to attempt to alleviate distention.  No new orders received at this time.  Continuing to monitor.

## 2015-09-03 NOTE — Evaluation (Signed)
Clinical/Bedside Swallow Evaluation Patient Details  Name: Harlow Asadna G Coale MRN: [id number] Date of Birth: 12-14-1940  Today's Date: 09/03/2015 Time: SLP Start Time (ACUTE ONLY): 1407 SLP Stop Time (ACUTE ONLY): 1417 SLP Time Calculation (min) (ACUTE ONLY): 10 min  Past Medical History:  Past Medical History  Diagnosis Date  . Acute respiratory failure (HCC)   . Community acquired pneumonia   . COPD with acute exacerbation (HCC)   . Anemia   . Hypokalemia   . Hyponatremia   . CHF (congestive heart failure) (HCC)   . Hypertension   . Shortness of breath dyspnea   . History of kidney stones    Past Surgical History:  Past Surgical History  Procedure Laterality Date  . Back surgery    . Cyst excision      base of tongue  . Cholecystectomy    . Abdominal hysterectomy     HPI:  74 y.o. female with PMH of COPD on 4L oxygen at home, dCHF, DVT and atrial fibrillation on coumadin, hypertension, GERD, anemia, who presents with worsening shortness of breath.   Assessment / Plan / Recommendation Clinical Impression   Pt presents with what appears to be grossly intact oropharyngeal swallowing function.  No areas of focal weakness were noted on oral motor exam and pt exhibited no overt s/s of aspiration with thin liquids or solids, despite being challenged with mixed consistencies and large consecutive boluses of liquids via straw.  Pt reports no difficulty tolerating regular textures and thin liquids.  Recommend that pt remain on her currently prescribed diet.  Given that pt is at her baseline for swallowing function, no further ST needs are indicated at this time.      Aspiration Risk  No limitations    Diet Recommendation Regular;Thin liquid   Liquid Administration via: Cup;Straw Medication Administration: Whole meds with liquid Supervision: Patient able to self feed Postural Changes: Seated upright at 90 degrees;Remain upright for at least 30 minutes after po intake       Follow  up Recommendations  None      Swallow Study   General HPI: 74 y.o. female with PMH of COPD on 4L oxygen at home, dCHF, DVT and atrial fibrillation on coumadin, hypertension, GERD, anemia, who presents with worsening shortness of breath. Type of Study: Bedside Swallow Evaluation Previous Swallow Assessment: none on record Diet Prior to this Study: Regular;Thin liquids Temperature Spikes Noted: No Respiratory Status: Nasal cannula History of Recent Intubation: No Behavior/Cognition: Alert;Cooperative;Pleasant mood Oral Cavity Assessment: Within Functional Limits Oral Care Completed by SLP: No Oral Cavity - Dentition: Adequate natural dentition;Poor condition Vision: Functional for self-feeding Self-Feeding Abilities: Able to feed self Patient Positioning: Upright in bed Baseline Vocal Quality: Hoarse Volitional Cough: Congested Volitional Swallow: Able to elicit    Oral/Motor/Sensory Function Overall Oral Motor/Sensory Function: Within functional limits   Ice Chips     Thin Liquid Thin Liquid: Within functional limits Presentation: Straw    Nectar Thick     Honey Thick     Puree Puree: Within functional limits Presentation: Spoon;Self Fed   Solid Solid: Within functional limits Presentation: Self Fed       Cletis Clack, Joni ReiningNicole L 09/03/2015,2:26 PM

## 2015-09-03 NOTE — Progress Notes (Signed)
RT Note: Patient was instructed on flutter valve and it's indications. Patient states that she has used it before. She demonstrated the technique very well and tolerated it well also. Patient has a congested non productive cough. Rt will continue to monitor.

## 2015-09-03 NOTE — Progress Notes (Signed)
Dr. Susie CassetteAbrol called me back with updates and told me to keep her heparin off through the night and not to give coumadin but other orders received and followed through. Pt denies any further pain and IV dilt gtt increased to 15 mg/min. Will closely monitor

## 2015-09-03 NOTE — Consult Note (Signed)
PULMONARY / CRITICAL CARE MEDICINE   Name: Brenda Wells MRN: [id number] DOB: 04/06/1941    ADMISSION DATE:  09/02/2015 CONSULTATION DATE:  09/03/2015  REFERRING MD:  Dr. 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  She is normally short of breath with any activity, but her symptoms got worse at rest.  This was present for about 3 days prior to admission.  She was getting cough, and felt chest congestion.  She couldn't bring up the phlegm.  She felt more difficulty getting air into her lungs, and was having some wheeze.  She was not aware of fever, sinus congestion, or sore throat.  She had terrible pain around her mid chest when she came in, but this has improved.  She denies nausea, vomiting, diarrhea, or abdominal pain.  She denies leg swelling.  She does have hx of A fib and DVT and has been on coumadin.  She had A fib with RVR on admission and started on cardizem gtt.  She was intubated for respiratory failure in 2005, and said she would never want that again >> she is DNR/DNI.  She's had several episodes of pneumonia.  She was noted to have urine retention, and had foley inserted this morning.  PAST MEDICAL HISTORY :  She  has a past medical history of Acute respiratory failure (HCC); Community acquired pneumonia; COPD with acute exacerbation (HCC); Anemia; Hypokalemia; Hyponatremia; CHF (congestive heart failure) (HCC); Hypertension; Shortness of breath dyspnea; and History of kidney stones.  PAST SURGICAL HISTORY: She  has past surgical history that includes Back surgery; Cyst excision; Cholecystectomy; and Abdominal hysterectomy.  No Known Allergies  No current facility-administered medications on file prior to encounter.   Current Outpatient Prescriptions on File Prior to Encounter  Medication Sig  . diltiazem (CARTIA XT)  180 MG 24 hr capsule Take 1 capsule (180 mg total) by mouth daily.  . Fluticasone-Salmeterol (ADVAIR) 500-50 MCG/DOSE AEPB Inhale 1 puff into the lungs 2 (two) times daily.  . furosemide (LASIX) 40 MG tablet Take 40 mg by mouth daily.  Marland Kitchen ipratropium-albuterol (DUONEB) 0.5-2.5 (3) MG/3ML SOLN Take 3 mLs by nebulization 4 (four) times daily.  . pantoprazole (PROTONIX) 40 MG tablet Take 40 mg by mouth daily.  . predniSONE (DELTASONE) 20 MG tablet Take 1 tablet (20 mg total) by mouth daily with breakfast.  . warfarin (COUMADIN) 3 MG tablet Take 3 mg by mouth daily.  . cefUROXime (CEFTIN) 500 MG tablet Take 1 tablet (500 mg total) by mouth 2 (two) times daily with a meal. (Patient not taking: Reported on 09/02/2015)    FAMILY HISTORY:  There is hx of COPD.  SOCIAL HISTORY: She  reports that she has been smoking Cigarettes.  She has smoked for the past 60 years. She has never used smokeless tobacco. She reports that she does not drink alcohol or use illicit drugs.  REVIEW OF SYSTEMS:   Negative except above  SUBJECTIVE:  Feels anxious and hard to get air in her lungs.  VITAL SIGNS: BP 110/63 mmHg  Pulse 92  Temp(Src) 98.1 F (36.7 C) (Oral)  Resp 22  Ht  (1.651 m)  Wt 134 lb 3.2 oz (60.873 kg)  BMI 22.33 kg/m2  SpO2 92%  INTAKE / OUTPUT: I/O last 3 completed shifts: In: 940 [P.O.:360; I.V.:270; Other:60; IV Piggyback:250] Out: 2125 [  Urine:2125]  PHYSICAL EXAMINATION: General:  Thin, has to catch breath between words, using some accessory muscles Neuro:  Alert, follows commands, normal strength HEENT:  Pupils reactive, no sinus tenderness, no oral lesions, no LAN Cardiovascular:  Irregular, no murmur Lungs:  Decreased breath sounds, poor air movement, faint expiratory wheezing Abdomen:  Soft, non tender, + bowel sounds Musculoskeletal:  No edema Skin:  Multiple areas of ecchymosis  LABS:  BMET  Recent Labs Lab 09/02/15 0104  NA 139  K 3.3*  CL 92*  CO2 37*   BUN 14  CREATININE 0.86  GLUCOSE 112*    Electrolytes  Recent Labs Lab 09/02/15 0104 09/02/15 0443  CALCIUM 8.7*  --   MG  --  1.7    CBC  Recent Labs Lab 09/02/15 0104 09/03/15 0405  WBC 14.0* 19.3*  HGB 9.7* 8.5*  HCT 33.5* 28.4*  PLT 405* 397    Coag's  Recent Labs Lab 09/02/15 0104  INR 1.29    Sepsis Markers No results for input(s): LATICACIDVEN, PROCALCITON, O2SATVEN in the last 168 hours.  ABG  Recent Labs Lab 09/02/15 0329  PHART 7.431  PCO2ART 56.0*  PO2ART 66.0*    Liver Enzymes No results for input(s): AST, ALT, ALKPHOS, BILITOT, ALBUMIN in the last 168 hours.  Cardiac Enzymes No results for input(s): TROPONINI, PROBNP in the last 168 hours.  Glucose No results for input(s): GLUCAP in the last 168 hours.  Imaging Dg Chest Port 1 View  09/02/2015  CLINICAL DATA:  Shortness of breath today.  History of COPD. EXAM: PORTABLE CHEST 1 VIEW COMPARISON:  CT chest 07/22/2015.  Chest 07/21/2015. FINDINGS: Mild cardiac enlargement. Pulmonary vascularity is normal. Emphysematous changes in the lungs. Atelectasis in the lung bases. Fluid or thickened pleura in the left costophrenic angle. No focal consolidation. No pneumothorax. Mediastinal contours appear intact. Calcified aorta. IMPRESSION: Cardiac enlargement. Emphysematous changes in the lungs. Small pleural effusion or pleural thickening on the left. No focal airspace disease or consolidation in the lungs. Electronically Signed   By: Burman NievesWilliam  Stevens M.D.   On: 09/02/2015 01:17     STUDIES:   CULTURES: 12/15 Sputum >> 12/15 Blood >> 12/15 Pneumococcal Ag >> negative 12/15 Influenza PCR >> negative  ANTIBIOTICS: 12/15 Rocephin >> 12/15 Zithromax >>  SIGNIFICANT EVENTS: 12/15 Admit  LINES/TUBES:  DISCUSSION: 74 yo female with acute on chronic hypoxic/hypercapnic respiratory failure from AECOPD.  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  She is DNR/DNI.  ASSESSMENT / PLAN:  Acute on chronic hypoxic, hypercapnic respiratory failure. Plan: - oxygen to keep SpO2 88 to 95% - BiPAP qhs and prn during day for now  AECOPD. Hx of severe COPD/emphysema. Plan: - pulmicort, brovana, spiriva - scheduled duoneb for now >> consider change to prn on 12/17 - f/u CXR intermittently - adjust bronchial hygiene - day 2 rocephin/zithromax per primary team - adjust solumedrol - low dose morphine prn for dyspnea  A fib with RVT. Acute on chronic diastolic dysfx. Hx of DVT. Plan: - rate control, anti-coagulation, diuresis per primary team  Urine retention. Plan: - continue foley for now  Goals of Care. Plan: - DNR/DNI   Coralyn HellingVineet Kharisma Glasner, MD [facility] Pulmonary/Critical [facility] 09/03/2015, 10:59 AM Pager:  [phone] After 3pm call: [phone]

## 2015-09-03 NOTE — Progress Notes (Signed)
RT called by RN in reference to decline in patient condition.  RN notes that after returning patient to bed from ambulating she became short of breath with significant WOB and O2 desaturation.  Upon RT arrival to room, patient is on Bipap with SpO2 98-99%.  Patient tolerating Bipap well.  RT will continue to monitor

## 2015-09-03 NOTE — Progress Notes (Signed)
RT Note: When RT came to see the patient she had just finished eating and re positioning herself in the bed. She was having some increased SOB and WOB and was saturating 88% on a 4lpm. Patient was very anxious. RT calmed the patient down and got her to take slow deep breaths and control her breathing. Her SPo2 came up to 92% and SOB and WOB subsided. Patient was also administered her breathing treatment as well. Patient is no in no acute distress post neb and is stable. Patient will remain on her nasal cannula presently but will be placed back on her BIPAP if she needs it. Her RN came by and this was discussed with her too and she agrees. Rt will continue to monitor.

## 2015-09-03 NOTE — Progress Notes (Addendum)
[facility] PROGRESS NOTE  Brenda Wells OZH:[id number] DOB: 1941-09-10 DOA: 09/02/2015 PCP: [name], MD  Length of stay: 1   Assessment/Plan: Principal Problem:   Acute on chronic respiratory failure with hypoxia (HCC) Active Problems:   COPD exacerbation (HCC)   Atrial fibrillation with RVR (HCC)   History of DVT (deep vein thrombosis)   Anemia   Shortness of breath   Chronic anticoagulation - Coumadin, CHADS2VASC=4   GERD (gastroesophageal reflux disease)   Chronic diastolic (congestive) heart failure (HCC)   Assessment and plan Acute on chronic respiratory failure with hypoxia (HCC): most likely due COPD exacerbation given diffused wheezing. Less likely to have a CHF exacerbation given no any leg edema.  Recent CT scan 07/22/15 showed centrilobular emphysema, no PE, patient hospitalized from 11/2-11/8. Discharged on prednisone, was supposed to follow-up with Dr. Shan Levans in 2-3 weeks Now requiring BiPAP support, alternating with high flow nasal cannula pulmicort, brovana, spiriva -Continue Solu-Medrol 60 mg IV tid  -Oral azithromycin for 5 days. , Recently completed 7 days of antibiotics the evening of November -Mucinex for cough  -Urine S. pneumococcal antigen negative -Follow up blood culture x2, sputum culture, Flu pcr-negative -BiPAP as needed for work of breathing ABG, pH 7.43, PCO2 56, PO2 66 Pulmonary consultation today  Atrial Fibrillation with RVR: CHA2DS2-VASc Score is 4 , needs oral anticoagulation. Patient was on Coumadin at home , but INR is 1.29 on admission. Now on a heparin drip, held due to hematuria , can restart heparin in am if hematuria resolved  Continue to follow INR -continue coumadin per pharmacy -IV cardizem gtt to HR <110 Holding  home oral diltiazem as unable to take PO    Hematuria Patient developed sudden distension of her abdomen Acute hematuria  Made npo, hold heparin,check CT abdomen pelvis , no obvious  reason identified, may have a few renal cycts that need to be evaluated with MRI    History of DVT (deep vein thrombosis): -see above  Anemia: hgb stable. hgb 8.5 on 07/27/15-->9.7.> 8.5 -f/u  CBC closely   GERD: -Protonix  Chronic diastolic (congestive) heart failure (HCC): Echo 07/22/15 showed a year for 50-55%. Patient is taking Lasix to 40 mg daily. No leg edema. CHF is compensated. -continue Lasix    DVT prophylaxsis Coumadin  Code Status:      Code Status Orders        Start     Ordered   09/02/15 1017  Do not attempt resuscitation (DNR)   Continuous    Question Answer Comment  In the event of cardiac or respiratory ARREST Do not call a "code blue"   In the event of cardiac or respiratory ARREST Do not perform Intubation, CPR, defibrillation or ACLS   In the event of cardiac or respiratory ARREST Use medication by any route, position, wound care, and other measures to relive pain and suffering. May use oxygen, suction and manual treatment of airway obstruction as needed for comfort.      09/02/15 1017      Family Communication: Discussed in detail with the patient, all imaging results, lab results explained to the patient   Disposition Plan: Pulmonary consult continue stepdown    Brief narrative: 74 y.o. female with PMH of COPD on 4L oxygen at home, dCHF, DVT and atrial fibrillation on coumadin, hypertension, GERD, anemia, who presents with worsening shortness of breath.  Patient reports that she has been having shortness of breath in the past several days,  which has been progressively getting worse. She has dry cough and wheezing. No chest pain, fever or chills. She denies runny nose or sore throat. She does not have leg edema. No abdominal pain, diarrhea, UTI, lateral weakness. She states that she has been compliant to her Lasix and Coumadin.  In ED, patient was found to have WBC 14.0, temperature normal, tachycardia, tachypnea, potassium 3.3, renal function  okay, INR 1.29, troponin negative, pending urinalysis and BNP. CXR showed emphysematous change, no pneumonia. Patient's committed to inpatient for further eval and treatment.  Consultants:  Pulmonary  Procedures:  None  Antibiotics: Anti-infectives    Start     Dose/Rate Route Frequency Ordered Stop   09/03/15 1000  azithromycin (ZITHROMAX) tablet 250 mg  Status:  Discontinued     250 mg Oral Daily 09/02/15 0304 09/02/15 0745   09/03/15 0000  azithromycin (ZITHROMAX) 500 mg in dextrose 5 % 250 mL IVPB     500 mg 250 mL/hr over 60 Minutes Intravenous Every 24 hours 09/02/15 0746     09/02/15 1000  azithromycin (ZITHROMAX) tablet 500 mg  Status:  Discontinued     500 mg Oral Daily 09/02/15 0304 09/02/15 0745   09/02/15 0800  cefTRIAXone (ROCEPHIN) 1 g in dextrose 5 % 50 mL IVPB     1 g 100 mL/hr over 30 Minutes Intravenous Every 24 hours 09/02/15 0745           HPI/Subjective: Desaturates with minimal activity, 88% on 4 L of oxygen with excessive work of breathing,   very anxious as well,  Objective: Filed Vitals:   09/03/15 0326 09/03/15 0622 09/03/15 0800 09/03/15 0831  BP:  139/62 110/63   Pulse: 84 97 92   Temp:  97.9 F (36.6 C) 98.1 F (36.7 C)   TempSrc:  Oral Oral   Resp: 27 27 22    Height:      Weight:  60.873 kg (134 lb 3.2 oz)    SpO2: 96% 94% 95% 92%    Intake/Output Summary (Last 24 hours) at 09/03/15 0941 Last data filed at 09/03/15 0900  Gross per 24 hour  Intake   1300 ml  Output   1875 ml  Net   -575 ml    Exam:  General: No acute respiratory distress Lungs: Clear to auscultation bilaterally without wheezes or crackles Cardiovascular: Regular rate and rhythm without murmur gallop or rub normal S1 and S2 Abdomen: Nontender, nondistended, soft, bowel sounds positive, no rebound, no ascites, no appreciable mass Extremities: No significant cyanosis, clubbing, or edema bilateral lower extremities     Data Review   Micro Results Recent  Results (from the past 240 hour(s))  MRSA PCR Screening     Status: Abnormal   Collection Time: 09/02/15  1:16 PM  Result Value Ref Range Status   MRSA by PCR POSITIVE (A) NEGATIVE Final    Comment:        The GeneXpert MRSA Assay (FDA approved for NASAL specimens only), is one component of a comprehensive MRSA colonization surveillance program. It is not intended to diagnose MRSA infection nor to guide or monitor treatment for MRSA infections. RESULT CALLED TO, READ BACK BY AND VERIFIED WITH: J. Darleene Cleaver RN 15:45 09/02/15 ([email or web address])     Radiology Reports Dg Chest Port 1 View  09/02/2015  CLINICAL DATA:  Shortness of breath today.  History of COPD. EXAM: PORTABLE CHEST 1 VIEW COMPARISON:  CT chest 07/22/2015.  Chest 07/21/2015. FINDINGS: Mild cardiac enlargement. Pulmonary vascularity is normal. Emphysematous  changes in the lungs. Atelectasis in the lung bases. Fluid or thickened pleura in the left costophrenic angle. No focal consolidation. No pneumothorax. Mediastinal contours appear intact. Calcified aorta. IMPRESSION: Cardiac enlargement. Emphysematous changes in the lungs. Small pleural effusion or pleural thickening on the left. No focal airspace disease or consolidation in the lungs. Electronically Signed   By: Burman NievesWilliam  Stevens M.D.   On: 09/02/2015 01:17     CBC  Recent Labs Lab 09/02/15 0104 09/03/15 0405  WBC 14.0* 19.3*  HGB 9.7* 8.5*  HCT 33.5* 28.4*  PLT 405* 397  MCV 81.5 81.1  MCH 23.6* 24.3*  MCHC 29.0* 29.9*  RDW 18.3* 18.4*  LYMPHSABS 2.9  --   MONOABS 0.8  --   EOSABS 0.0  --   BASOSABS 0.0  --     Chemistries   Recent Labs Lab 09/02/15 0104 09/02/15 0443  NA 139  --   K 3.3*  --   CL 92*  --   CO2 37*  --   GLUCOSE 112*  --   BUN 14  --   CREATININE 0.86  --   CALCIUM 8.7*  --   MG  --  1.7   ------------------------------------------------------------------------------------------------------------------ estimated creatinine clearance is  52.4 mL/min (by C-G formula based on Cr of 0.86). ------------------------------------------------------------------------------------------------------------------ No results for input(s): HGBA1C in the last 72 hours. ------------------------------------------------------------------------------------------------------------------ No results for input(s): CHOL, HDL, LDLCALC, TRIG, CHOLHDL, LDLDIRECT in the last 72 hours. ------------------------------------------------------------------------------------------------------------------ No results for input(s): TSH, T4TOTAL, T3FREE, THYROIDAB in the last 72 hours.  Invalid input(s): FREET3 ------------------------------------------------------------------------------------------------------------------ No results for input(s): VITAMINB12, FOLATE, FERRITIN, TIBC, IRON, RETICCTPCT in the last 72 hours.  Coagulation profile  Recent Labs Lab 09/02/15 0104  INR 1.29    No results for input(s): DDIMER in the last 72 hours.  Cardiac Enzymes No results for input(s): CKMB, TROPONINI, MYOGLOBIN in the last 168 hours.  Invalid input(s): CK ------------------------------------------------------------------------------------------------------------------ Invalid input(s): POCBNP   CBG: No results for input(s): GLUCAP in the last 168 hours.     Studies: Dg Chest Port 1 View  09/02/2015  CLINICAL DATA:  Shortness of breath today.  History of COPD. EXAM: PORTABLE CHEST 1 VIEW COMPARISON:  CT chest 07/22/2015.  Chest 07/21/2015. FINDINGS: Mild cardiac enlargement. Pulmonary vascularity is normal. Emphysematous changes in the lungs. Atelectasis in the lung bases. Fluid or thickened pleura in the left costophrenic angle. No focal consolidation. No pneumothorax. Mediastinal contours appear intact. Calcified aorta. IMPRESSION: Cardiac enlargement. Emphysematous changes in the lungs. Small pleural effusion or pleural thickening on the left. No focal  airspace disease or consolidation in the lungs. Electronically Signed   By: Burman NievesWilliam  Stevens M.D.   On: 09/02/2015 01:17      No results found for: HGBA1C Lab Results  Component Value Date   CREATININE 0.86 09/02/2015       Scheduled Meds: . antiseptic oral rinse  7 mL Mouth Rinse BID  . azithromycin  500 mg Intravenous Q24H  . cefTRIAXone (ROCEPHIN)  IV  1 g Intravenous Q24H  . Chlorhexidine Gluconate Cloth  6 each Topical Q0600  . furosemide  40 mg Intravenous Once  . ipratropium  0.5 mg Nebulization QID  . levalbuterol  1.25 mg Nebulization Q6H  . methylPREDNISolone (SOLU-MEDROL) injection  60 mg Intravenous TID  . mupirocin ointment  1 application Nasal BID  . pantoprazole (PROTONIX) IV  40 mg Intravenous Q12H  . tiotropium  18 mcg Inhalation Daily  . Warfarin - Pharmacist Dosing Inpatient   Does  not apply q1800   Continuous Infusions: . diltiazem (CARDIZEM) infusion 12.5 mg/hr (09/03/15 0535)  . heparin 1,000 Units/hr (09/03/15 0701)    Principal Problem:   Acute on chronic respiratory failure with hypoxia (HCC) Active Problems:   COPD exacerbation (HCC)   Atrial fibrillation with RVR (HCC)   History of DVT (deep vein thrombosis)   Anemia   Shortness of breath   Chronic anticoagulation - Coumadin, CHADS2VASC=4   GERD (gastroesophageal reflux disease)   Chronic diastolic (congestive) heart failure (HCC)    Time spent: 45 minutes   [facility]  [facility] Pager [phone]. If 7PM-7AM, please contact night-coverage at [email or web address], password [facility] 09/03/2015, 9:41 AM  LOS: 1 day

## 2015-09-03 NOTE — Progress Notes (Signed)
At approximately 03:22, discontinued BIPAP therapy and replaced nasal canula. Weaned O2 delivery to 4L/m. Work of breathing and respiratory rate increased, but no distress noted. Patient tolerating well and holding O2 saturation at 96%.  Continuing to monitor.

## 2015-09-03 NOTE — Progress Notes (Signed)
I noticed on rounds that the patients abdomen was markedly distended. Her abdomen was not this distended when I put in the foley cath this am. There is now new blood in her urine. Pt is still afib in the low 100s and blood pressure is stable. I immediately notified Dr. Susie CassetteAbrol. Orders received to d/c IV heparin which I have done. I am also holding PO coumadin this evening. Orders for a Stat CT scan received and Pt taken down with 2 RNs to CT for test. When pt got back on floor, she began to get diaphoretic and complaining that her jaw was hurting. EKG done, vitals signs still unchanged. Dr. Susie CassetteAbrol notified again that pt was sick on her stomach, the jaw pain and diaphoresis. She gave me orders for Zofran and still awaiting the results of CT scan. She wanted her to have a breathing treatment. Respiratory called and immediately came.

## 2015-09-03 NOTE — Plan of Care (Signed)
Problem: Phase II Progression Outcomes Goal: Dyspnea controlled w/progressive activity Outcome: Not Progressing Attempted to progress patient out of bed to bedside commode overnight with limited success (see previous notes) She has had difficulty voiding (new problem this admission), requiring intermittent in-and-out catheterization for elimination x 3 in the past 24 hours. On her last attempt to void, she was able to do so, but quickly decompensated on return to bed. At that time, she demonstrated severe activity intolerance with acute respiratory distress and need for immediate BIPAP therapy.  Vital signs currently stable on humidified O2 per nasal canula at 4 L/m: Today's Vitals    09/03/15 0157 09/03/15 0158 09/03/15 0159 09/03/15 0326  BP:     113/55    Pulse: 105 99 100 84  Temp:          TempSrc:          Resp: 16 15 19 27   Height:          Weight:          SpO2: 98% 99% 99% 96%  PainSc:           Earlier this evening, patient educated at bedside re:  Plan of care (respiratory problems, COPD, CHF -basic)  Tests/procedures (none currently ordered, explained current results)  Unit routines  Medications  IV solumedrol  IV diltiazem  IV heparin  IV protonix  Nebulizer treatments per RT  Activity progression (contracted to attempt use of BSC overnight)  Intermittent straight catheterization  Possible need for indwelling Foley catheter (held for now, discussed with charge RN)  Safety plan  Discharge plan (plan was for possible DC with home health in AM 12/16)  Patient educational status unclear; attention and focus seem to wax and wane. She appears to have a relatively clear grasp on basic health maintenance principles related to her disease processes, but has periods of lethargy and distress that preclude adequate bedside education. Will require ongoing assessment and education involving caregiver (or skilled nursing) to ensure safety and compliance with  health-management strategies post-discharge.  At this time, would not recommend discharge to home, even with home-health services set up, given patient's level of activity intolerance and respiratory demands. CSW and case management are following for discharge needs; awaiting notes from them. Noted here for MD follow-up on rounds in AM.  Communicated with charge RN, Mellody DanceAllison Dundon and rapid response RN, Jorja LoaBrooke Burgin, who are also following patient. Will communicate with oncoming shift to ensure proper continuity of care.  Patient resting comfortably at this time. Continuing to monitor closely.

## 2015-09-04 DIAGNOSIS — R0602 Shortness of breath: Secondary | ICD-10-CM

## 2015-09-04 DIAGNOSIS — I5032 Chronic diastolic (congestive) heart failure: Secondary | ICD-10-CM

## 2015-09-04 LAB — COMPREHENSIVE METABOLIC PANEL
ALK PHOS: 63 U/L (ref 38–126)
ALT: 21 U/L (ref 14–54)
AST: 21 U/L (ref 15–41)
Albumin: 2.5 g/dL — ABNORMAL LOW (ref 3.5–5.0)
Anion gap: 11 (ref 5–15)
BUN: 22 mg/dL — AB (ref 6–20)
CO2: 33 mmol/L — AB (ref 22–32)
Calcium: 8.4 mg/dL — ABNORMAL LOW (ref 8.9–10.3)
Chloride: 91 mmol/L — ABNORMAL LOW (ref 101–111)
Creatinine, Ser: 1.11 mg/dL — ABNORMAL HIGH (ref 0.44–1.00)
GFR calc Af Amer: 56 mL/min — ABNORMAL LOW (ref >60)
GFR calc non Af Amer: 48 mL/min — ABNORMAL LOW (ref >60)
GLUCOSE: 310 mg/dL — AB (ref 65–99)
POTASSIUM: 4.6 mmol/L (ref 3.5–5.1)
Sodium: 135 mmol/L (ref 135–145)
TOTAL PROTEIN: 5.4 g/dL — AB (ref 6.5–8.1)
Total Bilirubin: 0.3 mg/dL (ref 0.3–1.2)

## 2015-09-04 LAB — PROTIME-INR
INR: 1.71 — ABNORMAL HIGH (ref 0.00–1.49)
Prothrombin Time: 20.1 seconds — ABNORMAL HIGH (ref 11.6–15.2)

## 2015-09-04 LAB — CBC
HCT: 28.4 % — ABNORMAL LOW (ref 36.0–46.0)
Hemoglobin: 8.5 g/dL — ABNORMAL LOW (ref 12.0–15.0)
MCH: 24.4 pg — AB (ref 26.0–34.0)
MCHC: 29.9 g/dL — AB (ref 30.0–36.0)
MCV: 81.4 fL (ref 78.0–100.0)
PLATELETS: 411 10*3/uL — AB (ref 150–400)
RBC: 3.49 MIL/uL — AB (ref 3.87–5.11)
RDW: 18.3 % — ABNORMAL HIGH (ref 11.5–15.5)
WBC: 20.1 10*3/uL — ABNORMAL HIGH (ref 4.0–10.5)

## 2015-09-04 LAB — TROPONIN I: Troponin I: <0.03 ng/mL (ref <0.031)

## 2015-09-04 LAB — MAGNESIUM: Magnesium: 2.6 mg/dL — ABNORMAL HIGH (ref 1.7–2.4)

## 2015-09-04 LAB — GLUCOSE, CAPILLARY
Glucose-Capillary: 253 mg/dL — ABNORMAL HIGH (ref 65–99)
Glucose-Capillary: 152 mg/dL — ABNORMAL HIGH (ref 65–99)

## 2015-09-04 LAB — HEPARIN LEVEL (UNFRACTIONATED): Heparin Unfractionated: <0.1 IU/mL — ABNORMAL LOW (ref 0.30–0.70)

## 2015-09-04 MED ORDER — DILTIAZEM HCL ER COATED BEADS 180 MG PO CP24
180.0000 mg | ORAL_CAPSULE | Freq: Every day | ORAL | Status: DC
  Administered 2015-09-04 – 2015-09-07 (×4): 180 mg via ORAL
  Filled 2015-09-04 (×4): qty 1

## 2015-09-04 MED ORDER — FUROSEMIDE 40 MG PO TABS
40.0000 mg | ORAL_TABLET | Freq: Every day | ORAL | Status: DC
  Administered 2015-09-04 – 2015-09-07 (×4): 40 mg via ORAL
  Filled 2015-09-04 (×4): qty 1

## 2015-09-04 MED ORDER — WARFARIN SODIUM 5 MG PO TABS
5.0000 mg | ORAL_TABLET | Freq: Once | ORAL | Status: AC
  Administered 2015-09-04: 5 mg via ORAL
  Filled 2015-09-04: qty 1

## 2015-09-04 MED ORDER — INSULIN ASPART 100 UNIT/ML ~~LOC~~ SOLN
0.0000 [IU] | Freq: Three times a day (TID) | SUBCUTANEOUS | Status: DC
Start: 2015-09-04 — End: 2015-09-07
  Administered 2015-09-04: 2 [IU] via SUBCUTANEOUS
  Administered 2015-09-04: 5 [IU] via SUBCUTANEOUS
  Administered 2015-09-05: 2 [IU] via SUBCUTANEOUS
  Administered 2015-09-05 (×2): 3 [IU] via SUBCUTANEOUS
  Administered 2015-09-06 (×2): 2 [IU] via SUBCUTANEOUS
  Administered 2015-09-06 – 2015-09-07 (×2): 3 [IU] via SUBCUTANEOUS
  Administered 2015-09-07: 2 [IU] via SUBCUTANEOUS

## 2015-09-04 NOTE — Plan of Care (Signed)
Problem: Safety: Goal: Ability to remain free from injury will improve Outcome: Progressing Pt educated on how to use call bell, and how to wait for assistance OOB

## 2015-09-04 NOTE — Progress Notes (Signed)
Patient is refusing to wear BIPAP at night. V60 is in room un plugged. Patient is in no distress at this time. SPO2 98% on 4 LPM nasal cannula. RT will continue to monitor patient.

## 2015-09-04 NOTE — Progress Notes (Signed)
PT c/o nausea. PRN Zofran given. Will continue to monitor, VSS

## 2015-09-04 NOTE — Progress Notes (Signed)
PULMONARY / CRITICAL CARE MEDICINE   Name: Brenda Wells MRN: [id number] DOB: 11/19/1940    ADMISSION DATE:  09/02/2015 CONSULTATION DATE:  09/03/2015  REFERRING MD:  Dr. Susie CassetteAbrol  CHIEF COMPLAINT:  Short of breath  SUBJECTIVE:  Breathing better.  Still congested.  Didn't feel like she needed BiPAP overnight.  VITAL SIGNS: BP 117/59 mmHg  Pulse 88  Temp(Src) 96.8 F (36 C) (Oral)  Resp 27  Ht 5\' 5"  (1.651 m)  Wt 134 lb 3.2 oz (60.873 kg)  BMI 22.33 kg/m2  SpO2 98%  INTAKE / OUTPUT: I/O last 3 completed shifts: In: 2551.4 [P.O.:1240; I.V.:701.4; Other:60; IV Piggyback:550] Out: 4400 [Urine:4400]  PHYSICAL EXAMINATION: General: pleasant, speaking in full sentences Neuro:  Alert, follows commands, normal strength HEENT:  no sinus tenderness Cardiovascular:  Irregular, no murmur Lungs:  Decreased breath sounds, better air movement, faint wheezign Abdomen:  Soft, non tender Musculoskeletal:  No edema Skin:  Multiple areas of ecchymosis  LABS:  BMET  Recent Labs Lab 09/02/15 0104 09/03/15 1847 09/04/15 0255  NA 139 135 135  K 3.3* 4.5 4.6  CL 92* 94* 91*  CO2 37* 32 33*  BUN 14 23* 22*  CREATININE 0.86 1.03* 1.11*  GLUCOSE 112* 193* 310*    Electrolytes  Recent Labs Lab 09/02/15 0104 09/02/15 0443 09/03/15 1847 09/04/15 0255  CALCIUM 8.7*  --  8.5* 8.4*  MG  --  1.7 2.0 2.6*    CBC  Recent Labs Lab 09/03/15 0405 09/03/15 1847 09/04/15 0255  WBC 19.3* 20.5* 20.1*  HGB 8.5* 8.9* 8.5*  HCT 28.4* 30.1* 28.4*  PLT 397 422* 411*    Coag's  Recent Labs Lab 09/03/15 1122 09/03/15 1847 09/04/15 0255  INR 1.62* 1.64* 1.71*    Sepsis Markers  Recent Labs Lab 09/03/15 1847  LATICACIDVEN 2.9*    ABG  Recent Labs Lab 09/02/15 0329  PHART 7.431  PCO2ART 56.0*  PO2ART 66.0*    Liver Enzymes  Recent Labs Lab 09/03/15 1847 09/04/15 0255  AST 22 21  ALT 22 21  ALKPHOS 60 63  BILITOT 0.3 0.3  ALBUMIN 2.9* 2.5*    Cardiac  Enzymes  Recent Labs Lab 09/03/15 1847  TROPONINI 0.06*    Glucose No results for input(s): GLUCAP in the last 168 hours.  Imaging Ct Abdomen Pelvis W Contrast  09/03/2015  CLINICAL DATA:  Distended abdomen.  Hematuria. EXAM: CT ABDOMEN AND PELVIS WITH CONTRAST TECHNIQUE: Multidetector CT imaging of the abdomen and pelvis was performed using the standard protocol following bolus administration of intravenous contrast. CONTRAST:  100mL OMNIPAQUE IOHEXOL 300 MG/ML  SOLN COMPARISON:  07/08/2015 FINDINGS: Lower chest: Patchy airspace densities are noted in the lung bases bilaterally. No pleural fluid. Hepatobiliary: Again noted are multiple liver cysts. Previous cholecystectomy. No biliary dilatation. Pancreas: The pancreas is negative. Spleen: Normal appearance of the spleen. Adrenals/Urinary Tract: The adrenal glands are normal. Indeterminate intermediate attenuating lesion arising from the inferior pole of the left kidney is again noted measuring 2.2 cm, image 38 of series 2. No kidney stones or obstructive uropathy identified. Bilateral renal vascular calcifications are noted. Urinary bladder is collapsed around a Foley catheter. No bladder calculi noted. Stomach/Bowel: Moderate distension of the stomach. The small bowel loops have a normal course and caliber without obstruction. Moderate stool burden identified throughout the colon. No pathologic dilatation of the large bowel loops identified. Vascular/Lymphatic: Calcified atherosclerotic disease involves the abdominal aorta. The abdominal aorta has a maximum AP dimension of 2.7 cm. No  enlarged retroperitoneal or mesenteric adenopathy. No enlarged pelvic or inguinal lymph nodes. Reproductive: Previous hysterectomy.  No adnexal mass noted. Other: No free fluid or fluid collections identified. Musculoskeletal: Degenerative disc disease is noted within the lumbar spine. The bones appear osteopenic. No fractures identified. IMPRESSION: 1. Moderate  distension of the gastric lumen. No pathologic dilatation of the small or large bowel loops noted. 2. No findings to explain patient's hematuria. 3. Aortic atherosclerosis. Ectatic abdominal aorta at risk for aneurysm development. Recommend followup by ultrasound in 5 years. This recommendation follows ACR consensus guidelines: White Paper of the ACR Incidental Findings Committee II on Vascular Findings. J Am Coll Radiol 2013; 10:789-794. 4. Indeterminate intermediate attenuating lesion arises from the inferior pole of left kidney. This may represent a hemorrhagic or proteinaceous cyst. A small enhancing renal lesion cannot be excluded. Recommend followup imaging with non-emergent renal protocol MRI or CT. 5. Mild bilateral lower lobe airspace disease. Electronically Signed   By: Signa Kell M.D.   On: 09/03/2015 18:36   Dg Chest Port 1 View  09/03/2015  CLINICAL DATA:  Increasing shortness of breath EXAM: PORTABLE CHEST 1 VIEW COMPARISON:  09/02/2015 FINDINGS: Moderate cardiac enlargement. Aortic atherosclerosis. Mild interstitial edema is identified. No airspace consolidation. IMPRESSION: 1. Cardiac enlargement and mild edema. Electronically Signed   By: Signa Kell M.D.   On: 09/03/2015 19:46     STUDIES:   CULTURES: 12/15 Sputum >> 12/15 Blood >> 12/15 Pneumococcal Ag >> negative 12/15 Influenza PCR >> negative  ANTIBIOTICS: 12/15 Rocephin >> 12/15 Zithromax >>  SIGNIFICANT EVENTS: 12/15 Admit  LINES/TUBES:  DISCUSSION: 74 yo female with acute on chronic hypoxic/hypercapnic respiratory failure from AECOPD.  She has hx of severe COPD/emphysema on chronic prednisone, and 4 liters oxygen at baseline.  She is DNR/DNI.  ASSESSMENT / PLAN:  Acute on chronic hypoxic, hypercapnic respiratory failure. Plan: - oxygen to keep SpO2 88 to 95% - BiPAP prn  AECOPD. Hx of severe COPD/emphysema. Plan: - pulmicort, brovana, spiriva - PRN albuterol  - f/u CXR intermittently -  continue bronchial hygiene - day 3 rocephin/zithromax per primary team - continue solumedrol - low dose morphine prn for dyspnea  A fib with RVT. Acute on chronic diastolic dysfx. Hx of DVT. Plan: - rate control, anti-coagulation, diuresis per primary team  Urine retention. Plan: - continue foley for now  Goals of Care. Plan: - DNR/DNI  Some improvement compared to 12/16.    PCCM will f/u on 12/19 >> call if help needed sooner.   Coralyn Helling, MD [facility] Pulmonary/Critical Care 09/04/2015, 10:49 AM Pager:  [phone] After 3pm call: [phone]

## 2015-09-04 NOTE — Progress Notes (Signed)
During shift change. Pt C/o epigastric discomfort, and states the zofran did help some with nausea, Pt WOB also increased, PT given 1 mg of morphine. HR 110, Night RN aware

## 2015-09-04 NOTE — Progress Notes (Addendum)
[facility] PROGRESS NOTE  [name] ZOX:[id number] DOB: April 20, 1941 DOA: 09/02/2015 PCP: [name], MD  Assessment/Plan: 74 y.o. female with PMH of COPD on 4L oxygen at home, dCHF, DVT and atrial fibrillation on coumadin, hypertension, GERD, anemia, who presents with worsening shortness of breath  1. Acute on chronic respiratory failure with hypoxia. COPD exacerbation. Recent CT scan 07/22/15 showed centrilobular emphysema, no PE, patient hospitalized from 11/2-11/8.  -cont Solu-Medrol IV, bronchodilators, antibiotics, oxygen. Prn NiPPV 2. Atrial Fibrillation with RVR: CHA2DS2-VASc Score is 4. Patient was on Coumadin at home , but INR is 1.29 on admission. Received heparin drip, then held due to hematuria  -restart oral cardizem, control HR. IV cardizem as needed. Resume coumadin, as no further hematuria  3. Hematuria. no obvious reason identified, may have a few renal cycts that need to be evaluated with MRI  -cont monitor. Repeat Hg 4. History of DVT. see above 5. Anemia: hgb stable. hgb 8.5 on 07/27/15-->9.7.> 8.5. f/u CBC closely 6. Chronic diastolic: Echo 07/22/15 showed a year for 50-55%. Patient is taking Lasix to 40 mg daily. No leg edema. CHF is compensated.continue Lasix  - trop 0.06. No acute chest pains. obtain serial trop. Monitor   7. Hyperglycemia ? Chronic steroid use. Check ha1c. ISS for now    Code Status: DNR Family Communication: d/w patient, RN (indicate person spoken with, relationship, and if by phone, the number) Disposition Plan: pend clinical improvement    Consultants:  Pulmonology   Procedures:  None   Antibiotics:  Azith/ceftriaxone 12/16>>> (indicate start date, and stop date if known)  HPI/Subjective: Alert. No distress, but wheezing   Objective: Filed Vitals:   09/04/15 0445 09/04/15 0806  BP: 117/51 117/59  Pulse: 87 88  Temp: 96.8 F (36 C)   Resp: 27 27    Intake/Output Summary (Last 24 hours) at 09/04/15  1049 Last data filed at 09/04/15 0912  Gross per 24 hour  Intake 1451.38 ml  Output   3150 ml  Net -1698.62 ml   Filed Weights   09/02/15 1003 09/03/15 0622 09/04/15 0630  Weight: 59.829 kg (131 lb 14.4 oz) 60.873 kg (134 lb 3.2 oz) 60.873 kg (134 lb 3.2 oz)    Exam:   General:  Alert   Cardiovascular: s1,s2 rrr  Respiratory: BL wheezing LL  Abdomen: soft, nt,nd  Musculoskeletal: no leg edema    Data Reviewed: Basic Metabolic Panel:  Recent Labs Lab 09/02/15 0104 09/02/15 0443 09/03/15 1847 09/04/15 0255  NA 139  --  135 135  K 3.3*  --  4.5 4.6  CL 92*  --  94* 91*  CO2 37*  --  32 33*  GLUCOSE 112*  --  193* 310*  BUN 14  --  23* 22*  CREATININE 0.86  --  1.03* 1.11*  CALCIUM 8.7*  --  8.5* 8.4*  MG  --  1.7 2.0 2.6*   Liver Function Tests:  Recent Labs Lab 09/03/15 1847 09/04/15 0255  AST 22 21  ALT 22 21  ALKPHOS 60 63  BILITOT 0.3 0.3  PROT 6.1* 5.4*  ALBUMIN 2.9* 2.5*   No results for input(s): LIPASE, AMYLASE in the last 168 hours. No results for input(s): AMMONIA in the last 168 hours. CBC:  Recent Labs Lab 09/02/15 0104 09/03/15 0405 09/03/15 1847 09/04/15 0255  WBC 14.0* 19.3* 20.5* 20.1*  NEUTROABS 10.3*  --   --   --   HGB 9.7* 8.5* 8.9* 8.5*  HCT 33.5* 28.4* 30.1* 28.4*  MCV 81.5 81.1 81.8 81.4  PLT 405* 397 422* 411*   Cardiac Enzymes:  Recent Labs Lab 09/03/15 1847  TROPONINI 0.06*   BNP (last 3 results)  Recent Labs  07/21/15 1638 09/02/15 0124  BNP 102.0* 64.9    ProBNP (last 3 results) No results for input(s): PROBNP in the last 8760 hours.  CBG: No results for input(s): GLUCAP in the last 168 hours.  Recent Results (from the past 240 hour(s))  Culture, blood (routine x 2) Call MD if unable to obtain prior to antibiotics being given     Status: None (Preliminary result)   Collection Time: 09/02/15  3:28 AM  Result Value Ref Range Status   Specimen Description BLOOD RIGHT ARM  Final   Special  Requests BOTTLES DRAWN AEROBIC AND ANAEROBIC 5ML  Final   Culture NO GROWTH 1 DAY  Final   Report Status PENDING  Incomplete  Culture, blood (routine x 2) Call MD if unable to obtain prior to antibiotics being given     Status: None (Preliminary result)   Collection Time: 09/02/15  3:30 AM  Result Value Ref Range Status   Specimen Description BLOOD LEFT ARM  Final   Special Requests BOTTLES DRAWN AEROBIC AND ANAEROBIC 5ML  Final   Culture NO GROWTH 1 DAY  Final   Report Status PENDING  Incomplete  MRSA PCR Screening     Status: Abnormal   Collection Time: 09/02/15  1:16 PM  Result Value Ref Range Status   MRSA by PCR POSITIVE (A) NEGATIVE Final    Comment:        The GeneXpert MRSA Assay (FDA approved for NASAL specimens only), is one component of a comprehensive MRSA colonization surveillance program. It is not intended to diagnose MRSA infection nor to guide or monitor treatment for MRSA infections. RESULT CALLED TO, READ BACK BY AND VERIFIED WITH: J. Darleene CleaverLAUER RN 15:45 09/02/15 ([email or web address])      Studies: Ct Abdomen Pelvis W Contrast  09/03/2015  CLINICAL DATA:  Distended abdomen.  Hematuria. EXAM: CT ABDOMEN AND PELVIS WITH CONTRAST TECHNIQUE: Multidetector CT imaging of the abdomen and pelvis was performed using the standard protocol following bolus administration of intravenous contrast. CONTRAST:  100mL OMNIPAQUE IOHEXOL 300 MG/ML  SOLN COMPARISON:  07/08/2015 FINDINGS: Lower chest: Patchy airspace densities are noted in the lung bases bilaterally. No pleural fluid. Hepatobiliary: Again noted are multiple liver cysts. Previous cholecystectomy. No biliary dilatation. Pancreas: The pancreas is negative. Spleen: Normal appearance of the spleen. Adrenals/Urinary Tract: The adrenal glands are normal. Indeterminate intermediate attenuating lesion arising from the inferior pole of the left kidney is again noted measuring 2.2 cm, image 38 of series 2. No kidney stones or obstructive uropathy  identified. Bilateral renal vascular calcifications are noted. Urinary bladder is collapsed around a Foley catheter. No bladder calculi noted. Stomach/Bowel: Moderate distension of the stomach. The small bowel loops have a normal course and caliber without obstruction. Moderate stool burden identified throughout the colon. No pathologic dilatation of the large bowel loops identified. Vascular/Lymphatic: Calcified atherosclerotic disease involves the abdominal aorta. The abdominal aorta has a maximum AP dimension of 2.7 cm. No enlarged retroperitoneal or mesenteric adenopathy. No enlarged pelvic or inguinal lymph nodes. Reproductive: Previous hysterectomy.  No adnexal mass noted. Other: No free fluid or fluid collections identified. Musculoskeletal: Degenerative disc disease is noted within the lumbar spine. The bones appear osteopenic. No fractures identified. IMPRESSION: 1. Moderate distension of the gastric lumen. No pathologic dilatation of the small or large  bowel loops noted. 2. No findings to explain patient's hematuria. 3. Aortic atherosclerosis. Ectatic abdominal aorta at risk for aneurysm development. Recommend followup by ultrasound in 5 years. This recommendation follows ACR consensus guidelines: White Paper of the ACR Incidental Findings Committee II on Vascular Findings. J Am Coll Radiol 2013; 10:789-794. 4. Indeterminate intermediate attenuating lesion arises from the inferior pole of left kidney. This may represent a hemorrhagic or proteinaceous cyst. A small enhancing renal lesion cannot be excluded. Recommend followup imaging with non-emergent renal protocol MRI or CT. 5. Mild bilateral lower lobe airspace disease. Electronically Signed   By: Signa Kell M.D.   On: 09/03/2015 18:36   Dg Chest Port 1 View  09/03/2015  CLINICAL DATA:  Increasing shortness of breath EXAM: PORTABLE CHEST 1 VIEW COMPARISON:  09/02/2015 FINDINGS: Moderate cardiac enlargement. Aortic atherosclerosis. Mild  interstitial edema is identified. No airspace consolidation. IMPRESSION: 1. Cardiac enlargement and mild edema. Electronically Signed   By: Signa Kell M.D.   On: 09/03/2015 19:46    Scheduled Meds: . antiseptic oral rinse  7 mL Mouth Rinse BID  . arformoterol  15 mcg Nebulization BID  . azithromycin  500 mg Intravenous Q24H  . budesonide (PULMICORT) nebulizer solution  0.25 mg Nebulization BID  . cefTRIAXone (ROCEPHIN)  IV  1 g Intravenous Q24H  . Chlorhexidine Gluconate Cloth  6 each Topical Q0600  . guaiFENesin  1,200 mg Oral BID  . methylPREDNISolone (SOLU-MEDROL) injection  60 mg Intravenous Q6H  . mupirocin ointment  1 application Nasal BID  . pantoprazole  40 mg Oral Q1200  . tiotropium  18 mcg Inhalation Daily  . warfarin  5 mg Oral ONCE-1800  . Warfarin - Pharmacist Dosing Inpatient   Does not apply q1800   Continuous Infusions: . diltiazem (CARDIZEM) infusion 5 mg/hr (09/04/15 1037)    Principal Problem:   Acute on chronic respiratory failure with hypoxia (HCC) Active Problems:   COPD exacerbation (HCC)   Atrial fibrillation with RVR (HCC)   History of DVT (deep vein thrombosis)   Anemia   Shortness of breath   Chronic anticoagulation - Coumadin, CHADS2VASC=4   GERD (gastroesophageal reflux disease)   Chronic diastolic (congestive) heart failure (HCC)    Time spent: >35 Minutes     Brenda Wells  [facility] Pager [phone]. If 7PM-7AM, please contact night-coverage at [email or web address], password [facility] 09/04/2015, 10:49 AM  LOS: 2 days

## 2015-09-04 NOTE — Progress Notes (Signed)
ANTICOAGULATION CONSULT NOTE  Pharmacy Consult for warfarin Indication: DVT and afib  No Known Allergies  Patient Measurements: Height: 5\' 5"  (165.1 cm) Weight: 134 lb 3.2 oz (60.873 kg) IBW/kg (Calculated) : 57  Vital Signs: Temp: 96.8 F (36 C) (12/17 0445) Temp Source: Oral (12/17 0445) BP: 117/59 mmHg (12/17 0806) Pulse Rate: 88 (12/17 0806)  Labs:  Recent Labs  09/02/15 0104  09/02/15 1921 09/03/15 0405 09/03/15 1122 09/03/15 1847 09/04/15 0255  HGB 9.7*  --   --  8.5*  --  8.9* 8.5*  HCT 33.5*  --   --  28.4*  --  30.1* 28.4*  PLT 405*  --   --  397  --  422* 411*  LABPROT 16.2*  --   --   --  19.3* 19.4* 20.1*  INR 1.29  --   --   --  1.62* 1.64* 1.71*  HEPARINUNFRC  --   < > 0.50 0.57  --   --  <0.10*  CREATININE 0.86  --   --   --   --  1.03* 1.11*  TROPONINI  --   --   --   --   --  0.06*  --   < > = values in this interval not displayed.  Estimated Creatinine Clearance: 40.6 mL/min (by C-G formula based on Cr of 1.11).   Medical History: Past Medical History  Diagnosis Date  . Acute respiratory failure (HCC)   . Community acquired pneumonia   . COPD with acute exacerbation (HCC)   . Anemia   . Hypokalemia   . Hyponatremia   . CHF (congestive heart failure) (HCC)   . Hypertension   . Shortness of breath dyspnea   . History of kidney stones     Medications:  See electronic med rec  Assessment: 74 y.o. F presents with SOB. Pt on coumadin PTA for DVT (07/2015) and afib. Admit INR subtherapeutic (1.29). Home dose 3mg  daily - last dose 12/14.Coumadin restarted 12/15.   Heparin turned off yesterday at 1720 and coumadin dose not given due to new hematuria. Restart coumadin today as hematuria has resolved, but do not restart heparin (confirmed w/ Dr. York SpanielBuriev).  Goal of Therapy:  Heparin level 0.3-0.7 units/ml  INR 2-3 Monitor platelets by anticoagulation protocol: Yes    Plan:  Coumadin 5mg  PO x1 Monitor daily INR, s/sx of bleeding  Greggory Stallionristy  Reyes, PharmD Clinical Pharmacy Resident Pager # [phone] 09/04/2015 11:32 AM

## 2015-09-04 NOTE — Progress Notes (Signed)
Pt encouraged to get out of bed, and walk. Pt refuses, states she does not feel well

## 2015-09-04 NOTE — Plan of Care (Signed)
Problem: Phase I Progression Outcomes Goal: Dyspnea controlled at rest Outcome: Completed/Met Date Met:  09/04/15 Patient appears to be doing marginally better from a cardiopulmonary standpoint tonight. HR is down significantly and no longer in sustained atrial fibrillation; maintaining SR with PACs. Respiratory rates at rest are also improved with O2 saturations holding at 94 - 99% on O2 per nasal canula at home dose of 4 L/m.  Abdominal distention was noted earlier today (still present), leading to CT of ABD and pelvis to evaluate:  showed moderate gastric and small-bowel inflation. Considering patient was on BIPAP several times over the past 24 hours, it is likely this is the source. Portable CXR this evening showed mild interstitial edema. Lactic acid was 2.9.  Discussed new results and patient status at that time with Walden Field, NP for [facility]; recommended holding BIPAP tonight unless absolutely necessary to avoid further gastric dilatation. NP in agreement with this strategy; later received orders for lasix 20 mg IV x 1 for CXR results.  Recent vital signs: Filed Vitals:    09/03/15 1936 09/03/15 2141 09/03/15 2340 09/04/15 0207  BP: 126/63   126/58    Pulse: 113   96 93  Temp:     96.9 F (36.1 C)    TempSrc: Oral   Oral    Resp: _0 Height:          Weight:          SpO2: 98% 98% 97% 100%   Indwelling Foley catheter was inserted earlier today for acute urinary retention and IV diuresis; currently problem-free. Initially, patient was experiencing hematuria; this has cleared.  Patient educated earlier this evening re:  Plan of care  Labs/tests/procedures (none currently pending; reviewed today's results with patient)  Importance of routine Foley catheter care (performed per protocol/orders at time of education)  Infection control (contact precautions, treatment protocol for MRSA positive results)  Medications  IV furosemide  IV  diltiazem  Mucinex  Intranasal mupirocin ointment  Magnesium sulfate  Oxygen requirements (holding BIPAP QHS unless absolutely necessary)  Need to call RN for any worsening symptoms  Safety plan  Patient has no active concerns at this time. Resting comfortably in bed.  Continuing to monitor closely.

## 2015-09-05 LAB — CBC
HCT: 28.5 % — ABNORMAL LOW (ref 36.0–46.0)
Hemoglobin: 8.2 g/dL — ABNORMAL LOW (ref 12.0–15.0)
MCH: 23.4 pg — AB (ref 26.0–34.0)
MCHC: 28.8 g/dL — AB (ref 30.0–36.0)
MCV: 81.2 fL (ref 78.0–100.0)
PLATELETS: 384 10*3/uL (ref 150–400)
RBC: 3.51 MIL/uL — AB (ref 3.87–5.11)
RDW: 18 % — ABNORMAL HIGH (ref 11.5–15.5)
WBC: 15.1 10*3/uL — ABNORMAL HIGH (ref 4.0–10.5)

## 2015-09-05 LAB — BASIC METABOLIC PANEL
Anion gap: 9 (ref 5–15)
BUN: 24 mg/dL — ABNORMAL HIGH (ref 6–20)
CALCIUM: 8.1 mg/dL — AB (ref 8.9–10.3)
CO2: 35 mmol/L — AB (ref 22–32)
CREATININE: 1.1 mg/dL — AB (ref 0.44–1.00)
Chloride: 89 mmol/L — ABNORMAL LOW (ref 101–111)
GFR calc non Af Amer: 49 mL/min — ABNORMAL LOW (ref >60)
GFR, EST AFRICAN AMERICAN: 56 mL/min — AB (ref >60)
GLUCOSE: 299 mg/dL — AB (ref 65–99)
Potassium: 4.6 mmol/L (ref 3.5–5.1)
Sodium: 133 mmol/L — ABNORMAL LOW (ref 135–145)

## 2015-09-05 LAB — LIPASE, BLOOD: LIPASE: 51 U/L (ref 11–51)

## 2015-09-05 LAB — GLUCOSE, CAPILLARY
GLUCOSE-CAPILLARY: 204 mg/dL — AB (ref 65–99)
GLUCOSE-CAPILLARY: 231 mg/dL — AB (ref 65–99)
GLUCOSE-CAPILLARY: 202 mg/dL — AB (ref 65–99)
Glucose-Capillary: 198 mg/dL — ABNORMAL HIGH (ref 65–99)

## 2015-09-05 LAB — TROPONIN I: TROPONIN I: 0.06 ng/mL — AB (ref <0.031)

## 2015-09-05 LAB — PROTIME-INR
INR: 1.83 — ABNORMAL HIGH (ref 0.00–1.49)
PROTHROMBIN TIME: 21.1 s — AB (ref 11.6–15.2)

## 2015-09-05 MED ORDER — METHYLPREDNISOLONE SODIUM SUCC 125 MG IJ SOLR
60.0000 mg | Freq: Two times a day (BID) | INTRAMUSCULAR | Status: DC
  Administered 2015-09-05 – 2015-09-07 (×4): 60 mg via INTRAVENOUS
  Filled 2015-09-05 (×4): qty 2

## 2015-09-05 MED ORDER — ALUM & MAG HYDROXIDE-SIMETH 200-200-20 MG/5 ML NICU TOPICAL: 1.0000 "application " | TOPICAL | Status: DC | PRN

## 2015-09-05 MED ORDER — SENNOSIDES-DOCUSATE SODIUM 8.6-50 MG PO TABS
2.0000 | ORAL_TABLET | Freq: Two times a day (BID) | ORAL | Status: DC
  Administered 2015-09-05 – 2015-09-07 (×5): 2 via ORAL
  Filled 2015-09-05 (×5): qty 2

## 2015-09-05 MED ORDER — WARFARIN SODIUM 7.5 MG PO TABS
7.5000 mg | ORAL_TABLET | Freq: Once | ORAL | Status: AC
  Administered 2015-09-05: 7.5 mg via ORAL
  Filled 2015-09-05: qty 1

## 2015-09-05 NOTE — Progress Notes (Signed)
ANTICOAGULATION CONSULT NOTE  Pharmacy Consult for warfarin Indication: DVT and afib  No Known Allergies  Patient Measurements: Height: 5\' 5"  (165.1 cm) Weight: 132 lb 12.8 oz (60.238 kg) IBW/kg (Calculated) : 57  Vital Signs: Temp: 97.7 F (36.5 C) (12/18 0427) Temp Source: Oral (12/18 0427) BP: 104/52 mmHg (12/18 0427) Pulse Rate: 91 (12/18 0427)  Labs:  Recent Labs  09/02/15 1921  09/03/15 0405  09/03/15 1847 09/04/15 0255 09/04/15 1443 09/04/15 1908 09/05/15 0130  HGB  --   < > 8.5*  --  8.9* 8.5*  --   --  8.2*  HCT  --   < > 28.4*  --  30.1* 28.4*  --   --  28.5*  PLT  --   < > 397  --  422* 411*  --   --  384  LABPROT  --   --   --   < > 19.4* 20.1*  --   --  21.1*  INR  --   --   --   < > 1.64* 1.71*  --   --  1.83*  HEPARINUNFRC 0.50  --  0.57  --   --  <0.10*  --   --   --   CREATININE  --   --   --   --  1.03* 1.11*  --   --  1.10*  TROPONINI  --   --   --   < > 0.06*  --  <0.03 <0.03 0.06*  < > = values in this interval not displayed.  Estimated Creatinine Clearance: 41 mL/min (by C-G formula based on Cr of 1.1).   Medical History: Past Medical History  Diagnosis Date  . Acute respiratory failure (HCC)   . Community acquired pneumonia   . COPD with acute exacerbation (HCC)   . Anemia   . Hypokalemia   . Hyponatremia   . CHF (congestive heart failure) (HCC)   . Hypertension   . Shortness of breath dyspnea   . History of kidney stones     Medications:  See electronic med rec  Assessment: 74 y.o. F presents with SOB. Pt on coumadin PTA for DVT (07/2015) and afib. Admit INR subtherapeutic (1.29). Home dose 3mg  daily - last dose 12/14. Coumadin restarted 12/15.   Heparin turned off 12/16 at 1720 and coumadin doseheld due to new hematuria. Restarted coumadin yesterday as hematuria resolved, but do not restart heparin even though INR < 2 (confirmed w/ Dr. York SpanielBuriev).  INR remains subtherapeutic today. Will increase dose slightly. Would increase dose  cautiously while on concurrent warfarin + azithromycin.  Goal of Therapy:  INR 2-3 Monitor platelets by anticoagulation protocol: Yes    Plan:  Coumadin 7.5mg  PO x1 Monitor daily INR, s/sx of bleeding  Greggory Stallionristy Reyes, PharmD Clinical Pharmacy Resident Pager # [phone] 09/05/2015 7:37 AM

## 2015-09-05 NOTE — Progress Notes (Signed)
Pt ambulated to the nursing station and back. 02 at 4L. Pt tolerated well, desat to 91%, but back up to 98% instantly. Pt also has voided using BSC, and has tolerated well.

## 2015-09-05 NOTE — Progress Notes (Signed)
[facility] PROGRESS NOTE  Brenda Wells UJW:[id number] DOB: Dec 25, 1940 DOA: 09/02/2015 PCP: [name], MD  Length of stay: 3   Assessment/Plan: Principal Problem:   Acute on chronic respiratory failure with hypoxia (HCC) Active Problems:   COPD exacerbation (HCC)   Atrial fibrillation with RVR (HCC)   History of DVT (deep vein thrombosis)   Anemia   Shortness of breath   Chronic anticoagulation - Coumadin, CHADS2VASC=4   GERD (gastroesophageal reflux disease)   Chronic diastolic (congestive) heart failure (HCC)   Assessment and plan Acute on chronic respiratory failure with hypoxia (HCC): most likely due COPD exacerbation given diffused wheezing. Less likely to have a CHF exacerbation given no any leg edema.  Recent CT scan 07/22/15 showed centrilobular emphysema, no PE, patient hospitalized from 11/2-11/8. Discharged on prednisone, was supposed to follow-up with Dr. Shan Levans in 2-3 weeks Now requiring BiPAP support, alternating with high flow nasal cannula pulmicort, brovana, spiriva -Continue Solu-Medrol currently on 60 mg IV tid , start taper -Oral azithromycin for 5 days. , Recently completed 7 days of antibiotics the evening of November -Mucinex for cough  -Urine S. pneumococcal antigen negative -Follow up blood culture x2, sputum culture, Flu pcr-negative -BiPAP as needed for work of breathing ABG, pH 7.43, PCO2 56, PO2 66    Atrial Fibrillation with RVR: CHA2DS2-VASc Score is 4 , needs oral anticoagulation. Patient was on Coumadin at home , but INR is 1.29 on admission. Now OFF  heparin drip,   due to hematuria , Restarted Coumadin, dosing per pharmacy Continue to follow INR -continue coumadin per pharmacy -IV cardizem gtt to HR <110 Holding  home oral diltiazem as unable to take PO    Hematuria-resolved Patient developed sudden distension of her abdomen on 12/16 Heparin discontinued, CT abdomen pelvis , no obvious reason identified,  may have a few renal cycts that need to be evaluated with MRI  Hemoglobin stable  History of DVT (deep vein thrombosis): -see above  Anemia: hgb stable. hgb 8.5 on 07/27/15-->9.7.> 8.5 -f/u  CBC closely   GERD: -Protonix  Chronic diastolic (congestive) heart failure (HCC): Echo 07/22/15 showed a year for 50-55%. Patient is taking Lasix to 40 mg daily. No leg edema. CHF is compensated. -continue Lasix    DVT prophylaxsis Coumadin  Code Status:      Code Status Orders        Start     Ordered   09/02/15 1017  Do not attempt resuscitation (DNR)   Continuous    Question Answer Comment  In the event of cardiac or respiratory ARREST Do not call a "code blue"   In the event of cardiac or respiratory ARREST Do not perform Intubation, CPR, defibrillation or ACLS   In the event of cardiac or respiratory ARREST Use medication by any route, position, wound care, and other measures to relive pain and suffering. May use oxygen, suction and manual treatment of airway obstruction as needed for comfort.      09/02/15 1017      Family Communication: Discussed in detail with the patient, all imaging results, lab results explained to the patient   Disposition Plan: PT eval    Brief narrative: 74 y.o. female with PMH of COPD on 4L oxygen at home, dCHF, DVT and atrial fibrillation on coumadin, hypertension, GERD, anemia, who presents with worsening shortness of breath.  Patient reports that she has been having shortness of breath in the past several days, which has been progressively  getting worse. She has dry cough and wheezing. No chest pain, fever or chills. She denies runny nose or sore throat. She does not have leg edema. No abdominal pain, diarrhea, UTI, lateral weakness. She states that she has been compliant to her Lasix and Coumadin.  In ED, patient was found to have WBC 14.0, temperature normal, tachycardia, tachypnea, potassium 3.3, renal function okay, INR 1.29, troponin  negative, pending urinalysis and BNP. CXR showed emphysematous change, no pneumonia. Patient's committed to inpatient for further eval and treatment.  Consultants:  Pulmonary  Procedures:  None  Antibiotics: Anti-infectives    Start     Dose/Rate Route Frequency Ordered Stop   09/03/15 1000  azithromycin (ZITHROMAX) tablet 250 mg  Status:  Discontinued     250 mg Oral Daily 09/02/15 0304 09/02/15 0745   09/03/15 0000  azithromycin (ZITHROMAX) 500 mg in dextrose 5 % 250 mL IVPB     500 mg 250 mL/hr over 60 Minutes Intravenous Every 24 hours 09/02/15 0746     09/02/15 1000  azithromycin (ZITHROMAX) tablet 500 mg  Status:  Discontinued     500 mg Oral Daily 09/02/15 0304 09/02/15 0745   09/02/15 0800  cefTRIAXone (ROCEPHIN) 1 g in dextrose 5 % 50 mL IVPB     1 g 100 mL/hr over 30 Minutes Intravenous Every 24 hours 09/02/15 0745           HPI/Subjective: Refused to wear BiPAP at night, appears more comfortable, 98% on 4 L by nasal cannula, still complaining of intermittent epigastric discomfort  Objective: Filed Vitals:   09/05/15 0427 09/05/15 0745 09/05/15 0815 09/05/15 1127  BP: 104/52   116/58  Pulse: 91   93  Temp: 97.7 F (36.5 C)  97.8 F (36.6 C) 98.1 F (36.7 C)  TempSrc: Oral  Oral Oral  Resp: 22     Height:      Weight: 60.238 kg (132 lb 12.8 oz)     SpO2: 97% 98%  100%    Intake/Output Summary (Last 24 hours) at 09/05/15 1214 Last data filed at 09/05/15 1132  Gross per 24 hour  Intake   1685 ml  Output   4100 ml  Net  -2415 ml    Exam:  General: pleasant, speaking in full sentences Neuro: Alert, follows commands, normal strength HEENT: no sinus tenderness Cardiovascular: Irregular, no murmur Lungs: Decreased breath sounds, better air movement, faint wheezign Abdomen: Soft, non tender Musculoskeletal: No edema Skin: Multiple areas of ecchymosis  Data Review   Micro Results Recent Results (from the past 240 hour(s))  Culture, blood  (routine x 2) Call MD if unable to obtain prior to antibiotics being given     Status: None (Preliminary result)   Collection Time: 09/02/15  3:28 AM  Result Value Ref Range Status   Specimen Description BLOOD RIGHT ARM  Final   Special Requests BOTTLES DRAWN AEROBIC AND ANAEROBIC 5ML  Final   Culture NO GROWTH 3 DAYS  Final   Report Status PENDING  Incomplete  Culture, blood (routine x 2) Call MD if unable to obtain prior to antibiotics being given     Status: None (Preliminary result)   Collection Time: 09/02/15  3:30 AM  Result Value Ref Range Status   Specimen Description BLOOD LEFT ARM  Final   Special Requests BOTTLES DRAWN AEROBIC AND ANAEROBIC 5ML  Final   Culture NO GROWTH 3 DAYS  Final   Report Status PENDING  Incomplete  MRSA PCR Screening  Status: Abnormal   Collection Time: 09/02/15  1:16 PM  Result Value Ref Range Status   MRSA by PCR POSITIVE (A) NEGATIVE Final    Comment:        The GeneXpert MRSA Assay (FDA approved for NASAL specimens only), is one component of a comprehensive MRSA colonization surveillance program. It is not intended to diagnose MRSA infection nor to guide or monitor treatment for MRSA infections. RESULT CALLED TO, READ BACK BY AND VERIFIED WITH: J. Darleene Cleaver RN 15:45 09/02/15 ([email or web address])     Radiology Reports Ct Abdomen Pelvis W Contrast  09/03/2015  CLINICAL DATA:  Distended abdomen.  Hematuria. EXAM: CT ABDOMEN AND PELVIS WITH CONTRAST TECHNIQUE: Multidetector CT imaging of the abdomen and pelvis was performed using the standard protocol following bolus administration of intravenous contrast. CONTRAST:  OMNIPAQUE IOHEXOL 300 MG/ML  SOLN COMPARISON:  07/08/2015 FINDINGS: Lower chest: Patchy airspace densities are noted in the lung bases bilaterally. No pleural fluid. Hepatobiliary: Again noted are multiple liver cysts. Previous cholecystectomy. No biliary dilatation. Pancreas: The pancreas is negative. Spleen: Normal appearance of the  spleen. Adrenals/Urinary Tract: The adrenal glands are normal. Indeterminate intermediate attenuating lesion arising from the inferior pole of the left kidney is again noted measuring 2.2 cm, image 38 of series 2. No kidney stones or obstructive uropathy identified. Bilateral renal vascular calcifications are noted. Urinary bladder is collapsed around a Foley catheter. No bladder calculi noted. Stomach/Bowel: Moderate distension of the stomach. The small bowel loops have a normal course and caliber without obstruction. Moderate stool burden identified throughout the colon. No pathologic dilatation of the large bowel loops identified. Vascular/Lymphatic: Calcified atherosclerotic disease involves the abdominal aorta. The abdominal aorta has a maximum AP dimension of 2.7 cm. No enlarged retroperitoneal or mesenteric adenopathy. No enlarged pelvic or inguinal lymph nodes. Reproductive: Previous hysterectomy.  No adnexal mass noted. Other: No free fluid or fluid collections identified. Musculoskeletal: Degenerative disc disease is noted within the lumbar spine. The bones appear osteopenic. No fractures identified. IMPRESSION: 1. Moderate distension of the gastric lumen. No pathologic dilatation of the small or large bowel loops noted. 2. No findings to explain patient's hematuria. 3. Aortic atherosclerosis. Ectatic abdominal aorta at risk for aneurysm development. Recommend followup by ultrasound in 5 years. This recommendation follows ACR consensus guidelines: White Paper of the ACR Incidental Findings Committee II on Vascular Findings. J Am Coll Radiol 2013; 10:789-794. 4. Indeterminate intermediate attenuating lesion arises from the inferior pole of left kidney. This may represent a hemorrhagic or proteinaceous cyst. A small enhancing renal lesion cannot be excluded. Recommend followup imaging with non-emergent renal protocol MRI or CT. 5. Mild bilateral lower lobe airspace disease. Electronically Signed   By: Signa Kell M.D.   On: 09/03/2015 18:36   Dg Chest Port 1 View  09/03/2015  CLINICAL DATA:  Increasing shortness of breath EXAM: PORTABLE CHEST 1 VIEW COMPARISON:  09/02/2015 FINDINGS: Moderate cardiac enlargement. Aortic atherosclerosis. Mild interstitial edema is identified. No airspace consolidation. IMPRESSION: 1. Cardiac enlargement and mild edema. Electronically Signed   By: Signa Kell M.D.   On: 09/03/2015 19:46   Dg Chest Port 1 View  09/02/2015  CLINICAL DATA:  Shortness of breath today.  History of COPD. EXAM: PORTABLE CHEST 1 VIEW COMPARISON:  CT chest 07/22/2015.  Chest 07/21/2015. FINDINGS: Mild cardiac enlargement. Pulmonary vascularity is normal. Emphysematous changes in the lungs. Atelectasis in the lung bases. Fluid or thickened pleura in the left costophrenic angle. No focal consolidation. No pneumothorax. Mediastinal  contours appear intact. Calcified aorta. IMPRESSION: Cardiac enlargement. Emphysematous changes in the lungs. Small pleural effusion or pleural thickening on the left. No focal airspace disease or consolidation in the lungs. Electronically Signed   By: Burman Nieves M.D.   On: 09/02/2015 01:17     CBC  Recent Labs Lab 09/02/15 0104 09/03/15 0405 09/03/15 1847 09/04/15 0255 09/05/15 0130  WBC 14.0* 19.3* 20.5* 20.1* 15.1*  HGB 9.7* 8.5* 8.9* 8.5* 8.2*  HCT 33.5* 28.4* 30.1* 28.4* 28.5*  PLT 405* 397 422* 411* 384  MCV 81.5 81.1 81.8 81.4 81.2  MCH 23.6* 24.3* 24.2* 24.4* 23.4*  MCHC 29.0* 29.9* 29.6* 29.9* 28.8*  RDW 18.3* 18.4* 18.3* 18.3* 18.0*  LYMPHSABS 2.9  --   --   --   --   MONOABS 0.8  --   --   --   --   EOSABS 0.0  --   --   --   --   BASOSABS 0.0  --   --   --   --     Chemistries   Recent Labs Lab 09/02/15 0104 09/02/15 0443 09/03/15 1847 09/04/15 0255 09/05/15 0130  NA 139  --  135 135 133*  K 3.3*  --  4.5 4.6 4.6  CL 92*  --  94* 91* 89*  CO2 37*  --  32 33* 35*  GLUCOSE 112*  --  193* 310* 299*  BUN 14  --  23* 22* 24*   CREATININE 0.86  --  1.03* 1.11* 1.10*  CALCIUM 8.7*  --  8.5* 8.4* 8.1*  MG  --  1.7 2.0 2.6*  --   AST  --   --  22 21  --   ALT  --   --  22 21  --   ALKPHOS  --   --  60 63  --   BILITOT  --   --  0.3 0.3  --    ------------------------------------------------------------------------------------------------------------------ estimated creatinine clearance is 41 mL/min (by C-G formula based on Cr of 1.1). ------------------------------------------------------------------------------------------------------------------ No results for input(s): HGBA1C in the last 72 hours. ------------------------------------------------------------------------------------------------------------------ No results for input(s): CHOL, HDL, LDLCALC, TRIG, CHOLHDL, LDLDIRECT in the last 72 hours. ------------------------------------------------------------------------------------------------------------------ No results for input(s): TSH, T4TOTAL, T3FREE, THYROIDAB in the last 72 hours.  Invalid input(s): FREET3 ------------------------------------------------------------------------------------------------------------------ No results for input(s): VITAMINB12, FOLATE, FERRITIN, TIBC, IRON, RETICCTPCT in the last 72 hours.  Coagulation profile  Recent Labs Lab 09/02/15 0104 09/03/15 1122 09/03/15 1847 09/04/15 0255 09/05/15 0130  INR 1.29 1.62* 1.64* 1.71* 1.83*    No results for input(s): DDIMER in the last 72 hours.  Cardiac Enzymes  Recent Labs Lab 09/04/15 1443 09/04/15 1908 09/05/15 0130  TROPONINI <0.03 <0.03 0.06*   ------------------------------------------------------------------------------------------------------------------ Invalid input(s): POCBNP   CBG:  Recent Labs Lab 09/04/15 1144 09/04/15 1659 09/05/15 0749 09/05/15 1129  GLUCAP 253* 152* 204* 198*       Studies: Ct Abdomen Pelvis W Contrast  09/03/2015  CLINICAL DATA:  Distended abdomen.  Hematuria.  EXAM: CT ABDOMEN AND PELVIS WITH CONTRAST TECHNIQUE: Multidetector CT imaging of the abdomen and pelvis was performed using the standard protocol following bolus administration of intravenous contrast. CONTRAST:  OMNIPAQUE IOHEXOL 300 MG/ML  SOLN COMPARISON:  07/08/2015 FINDINGS: Lower chest: Patchy airspace densities are noted in the lung bases bilaterally. No pleural fluid. Hepatobiliary: Again noted are multiple liver cysts. Previous cholecystectomy. No biliary dilatation. Pancreas: The pancreas is negative. Spleen: Normal appearance of the spleen. Adrenals/Urinary Tract: The  adrenal glands are normal. Indeterminate intermediate attenuating lesion arising from the inferior pole of the left kidney is again noted measuring 2.2 cm, image 38 of series 2. No kidney stones or obstructive uropathy identified. Bilateral renal vascular calcifications are noted. Urinary bladder is collapsed around a Foley catheter. No bladder calculi noted. Stomach/Bowel: Moderate distension of the stomach. The small bowel loops have a normal course and caliber without obstruction. Moderate stool burden identified throughout the colon. No pathologic dilatation of the large bowel loops identified. Vascular/Lymphatic: Calcified atherosclerotic disease involves the abdominal aorta. The abdominal aorta has a maximum AP dimension of 2.7 cm. No enlarged retroperitoneal or mesenteric adenopathy. No enlarged pelvic or inguinal lymph nodes. Reproductive: Previous hysterectomy.  No adnexal mass noted. Other: No free fluid or fluid collections identified. Musculoskeletal: Degenerative disc disease is noted within the lumbar spine. The bones appear osteopenic. No fractures identified. IMPRESSION: 1. Moderate distension of the gastric lumen. No pathologic dilatation of the small or large bowel loops noted. 2. No findings to explain patient's hematuria. 3. Aortic atherosclerosis. Ectatic abdominal aorta at risk for aneurysm development. Recommend  followup by ultrasound in 5 years. This recommendation follows ACR consensus guidelines: White Paper of the ACR Incidental Findings Committee II on Vascular Findings. J Am Coll Radiol 2013; 10:789-794. 4. Indeterminate intermediate attenuating lesion arises from the inferior pole of left kidney. This may represent a hemorrhagic or proteinaceous cyst. A small enhancing renal lesion cannot be excluded. Recommend followup imaging with non-emergent renal protocol MRI or CT. 5. Mild bilateral lower lobe airspace disease. Electronically Signed   By: Signa Kell M.D.   On: 09/03/2015 18:36   Dg Chest Port 1 View  09/03/2015  CLINICAL DATA:  Increasing shortness of breath EXAM: PORTABLE CHEST 1 VIEW COMPARISON:  09/02/2015 FINDINGS: Moderate cardiac enlargement. Aortic atherosclerosis. Mild interstitial edema is identified. No airspace consolidation. IMPRESSION: 1. Cardiac enlargement and mild edema. Electronically Signed   By: Signa Kell M.D.   On: 09/03/2015 19:46      No results found for: HGBA1C Lab Results  Component Value Date   CREATININE 1.10* 09/05/2015       Scheduled Meds: . antiseptic oral rinse  7 mL Mouth Rinse BID  . arformoterol  15 mcg Nebulization BID  . azithromycin  500 mg Intravenous Q24H  . budesonide (PULMICORT) nebulizer solution  0.25 mg Nebulization BID  . cefTRIAXone (ROCEPHIN)  IV  1 g Intravenous Q24H  . Chlorhexidine Gluconate Cloth  6 each Topical Q0600  . diltiazem  180 mg Oral Daily  . furosemide  40 mg Oral Daily  . guaiFENesin  1,200 mg Oral BID  . insulin aspart  0-9 Units Subcutaneous TID WC  . methylPREDNISolone (SOLU-MEDROL) injection  60 mg Intravenous Q6H  . mupirocin ointment  1 application Nasal BID  . pantoprazole  40 mg Oral Q1200  . tiotropium  18 mcg Inhalation Daily  . warfarin  7.5 mg Oral ONCE-1800  . Warfarin - Pharmacist Dosing Inpatient   Does not apply q1800   Continuous Infusions:    Principal Problem:   Acute on chronic  respiratory failure with hypoxia (HCC) Active Problems:   COPD exacerbation (HCC)   Atrial fibrillation with RVR (HCC)   History of DVT (deep vein thrombosis)   Anemia   Shortness of breath   Chronic anticoagulation - Coumadin, CHADS2VASC=4   GERD (gastroesophageal reflux disease)   Chronic diastolic (congestive) heart failure (HCC)    Time spent: 45 minutes   Tearah Saulsbury  [facility] Pager [phone]. If 7PM-7AM, please contact night-coverage at [email or web address], password [facility] 09/05/2015, 12:14 PM  LOS: 3 days

## 2015-09-05 NOTE — Plan of Care (Signed)
Problem: Safety: Goal: Ability to remain free from injury will improve Outcome: Completed/Met Date Met:  09/05/15 Up with 1 person assist to [facility]

## 2015-09-06 LAB — COMPREHENSIVE METABOLIC PANEL
ALBUMIN: 2.6 g/dL — AB (ref 3.5–5.0)
ALT: 22 U/L (ref 14–54)
AST: 18 U/L (ref 15–41)
Alkaline Phosphatase: 52 U/L (ref 38–126)
Anion gap: 10 (ref 5–15)
BUN: 29 mg/dL — AB (ref 6–20)
CHLORIDE: 88 mmol/L — AB (ref 101–111)
CO2: 37 mmol/L — AB (ref 22–32)
CREATININE: 0.9 mg/dL (ref 0.44–1.00)
Calcium: 8.3 mg/dL — ABNORMAL LOW (ref 8.9–10.3)
GFR calc non Af Amer: >60 mL/min (ref >60)
GLUCOSE: 196 mg/dL — AB (ref 65–99)
Potassium: 4.3 mmol/L (ref 3.5–5.1)
SODIUM: 135 mmol/L (ref 135–145)
Total Bilirubin: 0.5 mg/dL (ref 0.3–1.2)
Total Protein: 5.2 g/dL — ABNORMAL LOW (ref 6.5–8.1)

## 2015-09-06 LAB — GLUCOSE, CAPILLARY
GLUCOSE-CAPILLARY: 188 mg/dL — AB (ref 65–99)
Glucose-Capillary: 188 mg/dL — ABNORMAL HIGH (ref 65–99)
Glucose-Capillary: 235 mg/dL — ABNORMAL HIGH (ref 65–99)
Glucose-Capillary: 167 mg/dL — ABNORMAL HIGH (ref 65–99)

## 2015-09-06 LAB — CBC
HEMATOCRIT: 30.6 % — AB (ref 36.0–46.0)
HEMOGLOBIN: 8.9 g/dL — AB (ref 12.0–15.0)
MCH: 23.4 pg — ABNORMAL LOW (ref 26.0–34.0)
MCHC: 29.1 g/dL — ABNORMAL LOW (ref 30.0–36.0)
MCV: 80.5 fL (ref 78.0–100.0)
Platelets: 365 10*3/uL (ref 150–400)
RBC: 3.8 MIL/uL — AB (ref 3.87–5.11)
RDW: 17.8 % — ABNORMAL HIGH (ref 11.5–15.5)
WBC: 13.3 10*3/uL — AB (ref 4.0–10.5)

## 2015-09-06 LAB — PROTIME-INR
INR: 2.28 — AB (ref 0.00–1.49)
Prothrombin Time: 24.9 seconds — ABNORMAL HIGH (ref 11.6–15.2)

## 2015-09-06 LAB — HEMOGLOBIN A1C
Hgb A1c MFr Bld: 7 % — ABNORMAL HIGH (ref 4.8–5.6)
MEAN PLASMA GLUCOSE: 154 mg/dL

## 2015-09-06 MED ORDER — MORPHINE SULFATE (CONCENTRATE) 10 MG/0.5ML PO SOLN
5.0000 mg | ORAL | Status: DC | PRN
  Administered 2015-09-06: 5 mg via ORAL
  Filled 2015-09-06: qty 0.5

## 2015-09-06 MED ORDER — LEVOFLOXACIN 500 MG PO TABS
500.0000 mg | ORAL_TABLET | Freq: Every day | ORAL | Status: DC
  Administered 2015-09-06 – 2015-09-07 (×2): 500 mg via ORAL
  Filled 2015-09-06 (×2): qty 1

## 2015-09-06 MED ORDER — WARFARIN SODIUM 2 MG PO TABS
3.0000 mg | ORAL_TABLET | Freq: Once | ORAL | Status: AC
  Administered 2015-09-06: 3 mg via ORAL
  Filled 2015-09-06: qty 1

## 2015-09-06 MED ORDER — ALUM & MAG HYDROXIDE-SIMETH 200-200-20 MG/5ML PO SUSP
30.0000 mL | Freq: Four times a day (QID) | ORAL | Status: DC | PRN
  Administered 2015-09-06: 30 mL via ORAL
  Filled 2015-09-06: qty 30

## 2015-09-06 NOTE — Care Management Important Message (Signed)
Important Message  Patient Details  Name: Brenda Wells MRN: [id number] Date of Birth: June 16, 1941   Medicare Important Message Given:  Yes    [name], Jerzi Tigert Kaye, RN 09/06/2015, 5:23 PM

## 2015-09-06 NOTE — Progress Notes (Signed)
Physical Therapy Treatment Patient Details Name: Brenda Wells MRN: [id number] DOB: Jan 15, 1941 Today's Date: 09/06/2015    History of Present Illness Brenda Wells is a 74 y.o. female with PMH of COPD on 4L oxygen at home, dCHF, DVT and atrial fibrillation on coumadin, hypertension, GERD, anemia, who presents with worsening shortness of breath.    PT Comments    Pt ambulated 150' with RW and min-guard A, 2x with seated rest break in between. O2 sats dropped to mid 80's and HR to upper 120's with each bout. Returned to O2 sats above 90% on 4L O2 and HR to 110's with rest.  Pt states she would prefer no HHPT, feel that this would be a smart plan for her though so continued recommendation.   Follow Up Recommendations  Home health PT;Supervision/Assistance - 24 hour     Equipment Recommendations  Other (comment) (4 wheel rollator with seat)    Recommendations for Other Services       Precautions / Restrictions Precautions Precautions: Fall Precaution Comments: watch HR and sats Restrictions Weight Bearing Restrictions: No    Mobility  Bed Mobility Overal bed mobility: Modified Independent                Transfers Overall transfer level: Modified independent Equipment used: Rolling walker (2 wheeled);None Transfers: Sit to/from [employer] to Stand: Modified independent (Device/Increase time) Stand pivot transfers: Modified independent (Device/Increase time)       General transfer comment: pt performed sit to stand safely to RW as well as SPT to and from [facility] without AD with safety  Ambulation/Gait Ambulation/Gait assistance: Min guard Ambulation Distance (Feet): 300 Feet (150' x2) Assistive device: Rolling walker (2 wheeled) Gait Pattern/deviations: Step-through pattern;Decreased stride length Gait velocity: decreased Gait velocity interpretation:  (between 1.8 and 2.62 ft/sec, household speed, not community) General Gait Details: pt took 5  min rest break in between bouts of ambulation for O2 sats back into 90's. Each walk dropped to upper 80's on 4L O2. Tolerated first 150' very well, very fatigued last 30' of second bout. HR up into mid 120's each time which has been typical for her   Stairs            Wheelchair Mobility    Modified Rankin (Stroke Patients Only)       Balance Overall balance assessment: Needs assistance Sitting-balance support: No upper extremity supported Sitting balance-Leahy Scale: Good     Standing balance support: No upper extremity supported Standing balance-Leahy Scale: Fair Standing balance comment: can maintain balance in static standing, safer with UE support for dynamic activity                    Cognition Arousal/Alertness: Awake/alert Behavior During Therapy: WFL for tasks assessed/performed Overall Cognitive Status: Within Functional Limits for tasks assessed                      Exercises Other Exercises Other Exercises: reviewed HEP in regards to exercises, frequency and reps. Pt verbalized understanding    General Comments        Pertinent Vitals/Pain Pain Assessment: No/denies pain    Home Living                      Prior Function            PT Goals (current goals can now be found in the care plan section) Acute Rehab PT Goals Patient Stated  Goal: get my breathing better PT Goal Formulation: With patient Time For Goal Achievement: 09/09/15 Potential to Achieve Goals: Good Progress towards PT goals: Progressing toward goals    Frequency  Min 3X/week    PT Plan Equipment recommendations need to be updated    Co-evaluation             End of Session Equipment Utilized During Treatment: Gait belt;Oxygen Activity Tolerance: Patient tolerated treatment well Patient left: in chair;with call bell/phone within reach     Time: 1027-1102 PT Time Calculation (min) (ACUTE ONLY): 35 min  Charges:  $Gait Training: 23-37  mins                    G Codes:     Lyanne CoVictoria Kartik Fernando, PT  Acute Rehab Services  [phone]  Lyanne CoManess, Filip Luten 09/06/2015, 12:56 PM

## 2015-09-06 NOTE — Progress Notes (Signed)
Inpatient Diabetes Program Recommendations  AACE/ADA: New Consensus Statement on Inpatient Glycemic Control (2015)  Target Ranges:  Prepandial:   less than 140 mg/dL      Peak postprandial:   less than 180 mg/dL (1-2 hours)      Critically ill patients:  140 - 180 mg/dL   Review of Glycemic Control  Diabetes history: None/Steroid induce hyperglycemia Current orders for Inpatient glycemic control: Novolog Sensitive TID  Inpatient Diabetes Program Recommendations: Correction (SSI): Glucose range 188-230's. Patient still on IV solumedrol 60 mg Q12hrs, please consider increasing correction to Novolog Moderate while inpatient.  Thanks,  Christena DeemShannon Dov Dill RN, MSN, [facility] Inpatient Diabetes Coordinator Team Pager [phone] (8a-5p)

## 2015-09-06 NOTE — Progress Notes (Signed)
UR Completed Flecia Shutter Graves-Bigelow, RN,BSN [phone]  

## 2015-09-06 NOTE — Progress Notes (Signed)
[facility] PROGRESS NOTE  Brenda Wells ZOX:[id number] DOB: August 02, 1941 DOA: 09/02/2015 PCP: [name], MD  Length of stay: 4   Assessment/Plan: Principal Problem:   Acute on chronic respiratory failure with hypoxia (HCC) Active Problems:   COPD exacerbation (HCC)   Atrial fibrillation with RVR (HCC)   History of DVT (deep vein thrombosis)   Anemia   Shortness of breath   Chronic anticoagulation - Coumadin, CHADS2VASC=4   GERD (gastroesophageal reflux disease)   Chronic diastolic (congestive) heart failure (HCC)     Assessment and plan Acute on chronic respiratory failure with hypoxia (HCC): most likely due COPD exacerbation given diffused wheezing. Less likely to have a CHF exacerbation given no any leg edema.  Recent CT scan 07/22/15 showed centrilobular emphysema, no PE, patient hospitalized from 11/2-11/8. Discharged on prednisone, was supposed to follow-up with Dr. Shan Levans in 2-3 weeks Now requiring BiPAP support, alternating with high flow nasal cannula pulmicort, brovana, spiriva -Continue Solu-Medrol currently on 60 mg IV q12, switch to prednisone 12/20 Rocephin/azithromycin switched to Levaquin -Mucinex for cough  -Urine S. pneumococcal antigen negative -Follow up blood culture x2, sputum culture, Flu pcr-negative -BiPAP as needed for work of breathing ABG, pH 7.43, PCO2 56, PO2 66  Abdominal pain likely secondary to constipation, started on stool softener, no BM yet , will try enema  Atrial Fibrillation with RVR: CHA2DS2-VASc Score is 4 , needs oral anticoagulation. Patient was on Coumadin at home , but INR is 1.29 on admission. Now OFF  heparin /Cardizemdrip,   due to hematuria , Restarted Coumadin, dosing per pharmacy INR therapeutic today Continue  Diltiazem 180 mg by mouth daily   Hematuria-resolved Patient developed sudden distension of her abdomen on 12/16 Heparin discontinued, CT abdomen pelvis , no obvious reason identified,  may have a few renal cycts that need to be evaluated with MRI  Hemoglobin stable  History of DVT (deep vein thrombosis): -see above  Anemia: hgb stable. hgb 8.5 on 07/27/15-->9.7.> 8.5 -f/u  CBC closely   GERD: -Protonix  Chronic diastolic (congestive) heart failure (HCC): Echo 07/22/15 showed a year for 50-55%. Patient is taking Lasix to 40 mg daily. No leg edema. CHF is compensated. -continue Lasix    DVT prophylaxsis Coumadin  Code Status:      Code Status Orders        Start     Ordered   09/02/15 1017  Do not attempt resuscitation (DNR)   Continuous    Question Answer Comment  In the event of cardiac or respiratory ARREST Do not call a "code blue"   In the event of cardiac or respiratory ARREST Do not perform Intubation, CPR, defibrillation or ACLS   In the event of cardiac or respiratory ARREST Use medication by any route, position, wound care, and other measures to relive pain and suffering. May use oxygen, suction and manual treatment of airway obstruction as needed for comfort.      09/02/15 1017      Family Communication: Discussed in detail with the patient, all imaging results, lab results explained to the patient   Disposition Plan: home with home health when medically stable from a pulmonary standpoint, likely tomorrow    Brief narrative: 74 y.o. female with PMH of COPD on 4L oxygen at home, dCHF, DVT and atrial fibrillation on coumadin, hypertension, GERD, anemia, who presents with worsening shortness of breath.  Patient reports that she has been having shortness of breath in the past several days,  which has been progressively getting worse. She has dry cough and wheezing. No chest pain, fever or chills. She denies runny nose or sore throat. She does not have leg edema. No abdominal pain, diarrhea, UTI, lateral weakness. She states that she has been compliant to her Lasix and Coumadin.  In ED, patient was found to have WBC 14.0, temperature normal,  tachycardia, tachypnea, potassium 3.3, renal function okay, INR 1.29, troponin negative, pending urinalysis and BNP. CXR showed emphysematous change, no pneumonia. Patient's committed to inpatient for further eval and treatment.  Consultants:  Pulmonary  Procedures:  None  Antibiotics: Anti-infectives    Start     Dose/Rate Route Frequency Ordered Stop   09/06/15 1000  levofloxacin (LEVAQUIN) tablet 500 mg     500 mg Oral Daily 09/06/15 0938     09/03/15 1000  azithromycin (ZITHROMAX) tablet 250 mg  Status:  Discontinued     250 mg Oral Daily 09/02/15 0304 09/02/15 0745   09/03/15 0000  azithromycin (ZITHROMAX) 500 mg in dextrose 5 % 250 mL IVPB  Status:  Discontinued     500 mg 250 mL/hr over 60 Minutes Intravenous Every 24 hours 09/02/15 0746 09/06/15 0937   09/02/15 1000  azithromycin (ZITHROMAX) tablet 500 mg  Status:  Discontinued     500 mg Oral Daily 09/02/15 0304 09/02/15 0745   09/02/15 0800  cefTRIAXone (ROCEPHIN) 1 g in dextrose 5 % 50 mL IVPB  Status:  Discontinued     1 g 100 mL/hr over 30 Minutes Intravenous Every 24 hours 09/02/15 0745 09/06/15 0937         HPI/Subjective: Breathing better. Has not worn bipap last 2 nights.  Objective: Filed Vitals:   09/06/15 0740 09/06/15 0744 09/06/15 0904 09/06/15 1116  BP:  133/53  110/72  Pulse:  119    Temp: 98.4 F (36.9 C)   98 F (36.7 C)  TempSrc: Oral   Oral  Resp:  22    Height:      Weight:      SpO2:  95% 99%     Intake/Output Summary (Last 24 hours) at 09/06/15 1234 Last data filed at 09/06/15 1219  Gross per 24 hour  Intake   1550 ml  Output   3650 ml  Net  -2100 ml    Exam:  General: pleasant, speaking in full sentences Neuro: Alert, follows commands, normal strength HEENT: no sinus tenderness Cardiovascular: Irregular, no murmur Lungs: Decreased breath sounds, better air movement, faint wheezign Abdomen: Soft, non tender Musculoskeletal: No edema Skin: Multiple areas of  ecchymosis  Data Review   Micro Results Recent Results (from the past 240 hour(s))  Culture, blood (routine x 2) Call MD if unable to obtain prior to antibiotics being given     Status: None (Preliminary result)   Collection Time: 09/02/15  3:28 AM  Result Value Ref Range Status   Specimen Description BLOOD RIGHT ARM  Final   Special Requests BOTTLES DRAWN AEROBIC AND ANAEROBIC  Final   Culture NO GROWTH 3 DAYS  Final   Report Status PENDING  Incomplete  Culture, blood (routine x 2) Call MD if unable to obtain prior to antibiotics being given     Status: None (Preliminary result)   Collection Time: 09/02/15  3:30 AM  Result Value Ref Range Status   Specimen Description BLOOD LEFT ARM  Final   Special Requests BOTTLES DRAWN AEROBIC AND ANAEROBIC  Final   Culture NO GROWTH 3 DAYS  Final  Report Status PENDING  Incomplete  MRSA PCR Screening     Status: Abnormal   Collection Time: 09/02/15  1:16 PM  Result Value Ref Range Status   MRSA by PCR POSITIVE (A) NEGATIVE Final    Comment:        The GeneXpert MRSA Assay (FDA approved for NASAL specimens only), is one component of a comprehensive MRSA colonization surveillance program. It is not intended to diagnose MRSA infection nor to guide or monitor treatment for MRSA infections. RESULT CALLED TO, READ BACK BY AND VERIFIED WITH: J. Darleene Cleaver RN 15:45 09/02/15 ([email or web address])     Radiology Reports Ct Abdomen Pelvis W Contrast  09/03/2015  CLINICAL DATA:  Distended abdomen.  Hematuria. EXAM: CT ABDOMEN AND PELVIS WITH CONTRAST TECHNIQUE: Multidetector CT imaging of the abdomen and pelvis was performed using the standard protocol following bolus administration of intravenous contrast. CONTRAST:  OMNIPAQUE IOHEXOL 300 MG/ML  SOLN COMPARISON:  07/08/2015 FINDINGS: Lower chest: Patchy airspace densities are noted in the lung bases bilaterally. No pleural fluid. Hepatobiliary: Again noted are multiple liver cysts. Previous  cholecystectomy. No biliary dilatation. Pancreas: The pancreas is negative. Spleen: Normal appearance of the spleen. Adrenals/Urinary Tract: The adrenal glands are normal. Indeterminate intermediate attenuating lesion arising from the inferior pole of the left kidney is again noted measuring 2.2 cm, image 38 of series 2. No kidney stones or obstructive uropathy identified. Bilateral renal vascular calcifications are noted. Urinary bladder is collapsed around a Foley catheter. No bladder calculi noted. Stomach/Bowel: Moderate distension of the stomach. The small bowel loops have a normal course and caliber without obstruction. Moderate stool burden identified throughout the colon. No pathologic dilatation of the large bowel loops identified. Vascular/Lymphatic: Calcified atherosclerotic disease involves the abdominal aorta. The abdominal aorta has a maximum AP dimension of 2.7 cm. No enlarged retroperitoneal or mesenteric adenopathy. No enlarged pelvic or inguinal lymph nodes. Reproductive: Previous hysterectomy.  No adnexal mass noted. Other: No free fluid or fluid collections identified. Musculoskeletal: Degenerative disc disease is noted within the lumbar spine. The bones appear osteopenic. No fractures identified. IMPRESSION: 1. Moderate distension of the gastric lumen. No pathologic dilatation of the small or large bowel loops noted. 2. No findings to explain patient's hematuria. 3. Aortic atherosclerosis. Ectatic abdominal aorta at risk for aneurysm development. Recommend followup by ultrasound in 5 years. This recommendation follows ACR consensus guidelines: White Paper of the ACR Incidental Findings Committee II on Vascular Findings. J Am Coll Radiol 2013; 10:789-794. 4. Indeterminate intermediate attenuating lesion arises from the inferior pole of left kidney. This may represent a hemorrhagic or proteinaceous cyst. A small enhancing renal lesion cannot be excluded. Recommend followup imaging with  non-emergent renal protocol MRI or CT. 5. Mild bilateral lower lobe airspace disease. Electronically Signed   By: Signa Kell M.D.   On: 09/03/2015 18:36   Dg Chest Port 1 View  09/03/2015  CLINICAL DATA:  Increasing shortness of breath EXAM: PORTABLE CHEST 1 VIEW COMPARISON:  09/02/2015 FINDINGS: Moderate cardiac enlargement. Aortic atherosclerosis. Mild interstitial edema is identified. No airspace consolidation. IMPRESSION: 1. Cardiac enlargement and mild edema. Electronically Signed   By: Signa Kell M.D.   On: 09/03/2015 19:46   Dg Chest Port 1 View  09/02/2015  CLINICAL DATA:  Shortness of breath today.  History of COPD. EXAM: PORTABLE CHEST 1 VIEW COMPARISON:  CT chest 07/22/2015.  Chest 07/21/2015. FINDINGS: Mild cardiac enlargement. Pulmonary vascularity is normal. Emphysematous changes in the lungs. Atelectasis in the lung bases. Fluid or  thickened pleura in the left costophrenic angle. No focal consolidation. No pneumothorax. Mediastinal contours appear intact. Calcified aorta. IMPRESSION: Cardiac enlargement. Emphysematous changes in the lungs. Small pleural effusion or pleural thickening on the left. No focal airspace disease or consolidation in the lungs. Electronically Signed   By: Burman NievesWilliam  Stevens M.D.   On: 09/02/2015 01:17     CBC  Recent Labs Lab 09/02/15 0104 09/03/15 0405 09/03/15 1847 09/04/15 0255 09/05/15 0130 09/06/15 0440  WBC 14.0* 19.3* 20.5* 20.1* 15.1* 13.3*  HGB 9.7* 8.5* 8.9* 8.5* 8.2* 8.9*  HCT 33.5* 28.4* 30.1* 28.4* 28.5* 30.6*  PLT 405* 397 422* 411* 384 365  MCV 81.5 81.1 81.8 81.4 81.2 80.5  MCH 23.6* 24.3* 24.2* 24.4* 23.4* 23.4*  MCHC 29.0* 29.9* 29.6* 29.9* 28.8* 29.1*  RDW 18.3* 18.4* 18.3* 18.3* 18.0* 17.8*  LYMPHSABS 2.9  --   --   --   --   --   MONOABS 0.8  --   --   --   --   --   EOSABS 0.0  --   --   --   --   --   BASOSABS 0.0  --   --   --   --   --     Chemistries   Recent Labs Lab 09/02/15 0104 09/02/15 0443  09/03/15 1847 09/04/15 0255 09/05/15 0130 09/06/15 0440  NA 139  --  135 135 133* 135  K 3.3*  --  4.5 4.6 4.6 4.3  CL 92*  --  94* 91* 89* 88*  CO2 37*  --  32 33* 35* 37*  GLUCOSE 112*  --  193* 310* 299* 196*  BUN 14  --  23* 22* 24* 29*  CREATININE 0.86  --  1.03* 1.11* 1.10* 0.90  CALCIUM 8.7*  --  8.5* 8.4* 8.1* 8.3*  MG  --  1.7 2.0 2.6*  --   --   AST  --   --  22 21  --  18  ALT  --   --  22 21  --  22  ALKPHOS  --   --  60 63  --  52  BILITOT  --   --  0.3 0.3  --  0.5   ------------------------------------------------------------------------------------------------------------------ estimated creatinine clearance is 50.1 mL/min (by C-G formula based on Cr of 0.9). ------------------------------------------------------------------------------------------------------------------  Recent Labs  09/04/15 1233  HGBA1C 7.0*   ------------------------------------------------------------------------------------------------------------------ No results for input(s): CHOL, HDL, LDLCALC, TRIG, CHOLHDL, LDLDIRECT in the last 72 hours. ------------------------------------------------------------------------------------------------------------------ No results for input(s): TSH, T4TOTAL, T3FREE, THYROIDAB in the last 72 hours.  Invalid input(s): FREET3 ------------------------------------------------------------------------------------------------------------------ No results for input(s): VITAMINB12, FOLATE, FERRITIN, TIBC, IRON, RETICCTPCT in the last 72 hours.  Coagulation profile  Recent Labs Lab 09/03/15 1122 09/03/15 1847 09/04/15 0255 09/05/15 0130 09/06/15 0440  INR 1.62* 1.64* 1.71* 1.83* 2.28*    No results for input(s): DDIMER in the last 72 hours.  Cardiac Enzymes  Recent Labs Lab 09/04/15 1443 09/04/15 1908 09/05/15 0130  TROPONINI <0.03 <0.03 0.06*    ------------------------------------------------------------------------------------------------------------------ Invalid input(s): POCBNP   CBG:  Recent Labs Lab 09/05/15 1129 09/05/15 1652 09/05/15 2147 09/06/15 0738 09/06/15 1113  GLUCAP 198* 231* 202* 188* 188*       Studies: No results found.    Lab Results  Component Value Date   HGBA1C 7.0* 09/04/2015   Lab Results  Component Value Date   CREATININE 0.90 09/06/2015       Scheduled Meds: . antiseptic oral  rinse  7 mL Mouth Rinse BID  . arformoterol  15 mcg Nebulization BID  . budesonide (PULMICORT) nebulizer solution  0.25 mg Nebulization BID  . Chlorhexidine Gluconate Cloth  6 each Topical Q0600  . diltiazem  180 mg Oral Daily  . furosemide  40 mg Oral Daily  . guaiFENesin  1,200 mg Oral BID  . insulin aspart  0-9 Units Subcutaneous TID WC  . levofloxacin  500 mg Oral Daily  . methylPREDNISolone (SOLU-MEDROL) injection  60 mg Intravenous Q12H  . mupirocin ointment  1 application Nasal BID  . pantoprazole  40 mg Oral Q1200  . senna-docusate  2 tablet Oral BID  . tiotropium  18 mcg Inhalation Daily  . warfarin  3 mg Oral ONCE-1800  . Warfarin - Pharmacist Dosing Inpatient   Does not apply q1800   Continuous Infusions:    Principal Problem:   Acute on chronic respiratory failure with hypoxia (HCC) Active Problems:   COPD exacerbation (HCC)   Atrial fibrillation with RVR (HCC)   History of DVT (deep vein thrombosis)   Anemia   Shortness of breath   Chronic anticoagulation - Coumadin, CHADS2VASC=4   GERD (gastroesophageal reflux disease)   Chronic diastolic (congestive) heart failure (HCC)    Time spent: 45 minutes   [facility]  [facility] Pager [phone]. If 7PM-7AM, please contact night-coverage at [email or web address], password [facility] 09/06/2015, 12:34 PM  LOS: 4 days

## 2015-09-06 NOTE — Progress Notes (Signed)
ANTICOAGULATION CONSULT NOTE - Follow Up Consult  Pharmacy Consult for Coumadin Indication: atrial fibrillation and DVT   No Known Allergies  Patient Measurements: Height: 5\' 5"  (165.1 cm) Weight: 132 lb 1.6 oz (59.92 kg) IBW/kg (Calculated) : 57  Vital Signs: Temp: 98 F (36.7 C) (12/19 1116) Temp Source: Oral (12/19 1116) BP: 110/72 mmHg (12/19 1116) Pulse Rate: 119 (12/19 0744)  Labs:  Recent Labs  09/04/15 0255 09/04/15 1443 09/04/15 1908 09/05/15 0130 09/06/15 0440  HGB 8.5*  --   --  8.2* 8.9*  HCT 28.4*  --   --  28.5* 30.6*  PLT 411*  --   --  384 365  LABPROT 20.1*  --   --  21.1* 24.9*  INR 1.71*  --   --  1.83* 2.28*  HEPARINUNFRC <0.10*  --   --   --   --   CREATININE 1.11*  --   --  1.10* 0.90  TROPONINI  --  <0.03 <0.03 0.06*  --     Estimated Creatinine Clearance: 50.1 mL/min (by C-G formula based on Cr of 0.9).  Assessment:   Hx DVT and afib.    INR is back into therapeutic range today, 2.28.  Recent hematuria, now resolved. Coumadin resumed on 12/17 with 5 mg; 7.5 mg given 12/18, so INR may still rise by am.   Home Coumadin regimen: 3 mg daily (though INR subtherapeutic on admit).    Azithromycin/Ceftraixone transitioned to Levaquin today. Sometimes effects INR.  Goal of Therapy:  INR 2-3 Monitor platelets by anticoagulation protocol: Yes   Plan:   Coumadin 3 mg x 1 today.  Continue daily PT/INR.  Dennie Fettersgan, Yu Peggs [place], [place] Pager: [phone] 09/06/2015,12:23 PM

## 2015-09-06 NOTE — Progress Notes (Signed)
PULMONARY / CRITICAL CARE MEDICINE   Name: Brenda Wells MRN: [id number] DOB: [birth year]    ADMISSION DATE:  09/02/2015 CONSULTATION DATE:  09/03/2015  REFERRING MD:  Dr. Susie Cassette  CHIEF COMPLAINT:  Short of breath  SUBJECTIVE:  Breathing better.  Has not worn bipap last 2 nights.    VITAL SIGNS: BP 133/53 mmHg  Pulse 119  Temp(Src) 98.4 F (36.9 C) (Oral)  Resp 22  Ht  (1.651 m)  Wt 132 lb 1.6 oz (59.92 kg)  BMI 21.98 kg/m2  SpO2 99%  INTAKE / OUTPUT: I/O last 3 completed shifts: In: 2155 [P.O.:1180; OZHYQ:657; IV Piggyback:250] Out: 6500 [Urine:6500]  PHYSICAL EXAMINATION:  General: pleasant, speaking in full sentences Neuro:  Alert, follows commands, normal strength HEENT:  no sinus tenderness Cardiovascular:  Irregular, no murmur Lungs:  resps even, mildly labored but at baseline per pt, diffuse exp wheeze, pursed lip breathing  Abdomen:  Soft, non tender Musculoskeletal:  No edema Skin:  Multiple areas of ecchymosis  LABS:  BMET  Recent Labs Lab 09/04/15 0255 09/05/15 0130 09/06/15 0440  NA 135 133* 135  K 4.6 4.6 4.3  CL 91* 89* 88*  CO2 33* 35* 37*  BUN 22* 24* 29*  CREATININE 1.11* 1.10* 0.90  GLUCOSE 310* 299* 196*    Electrolytes  Recent Labs Lab 09/02/15 0443 09/03/15 1847 09/04/15 0255 09/05/15 0130 09/06/15 0440  CALCIUM  --  8.5* 8.4* 8.1* 8.3*  MG 1.7 2.0 2.6*  --   --     CBC  Recent Labs Lab 09/04/15 0255 09/05/15 0130 09/06/15 0440  WBC 20.1* 15.1* 13.3*  HGB 8.5* 8.2* 8.9*  HCT 28.4* 28.5* 30.6*  PLT 411* 384 365    Coag's  Recent Labs Lab 09/04/15 0255 09/05/15 0130 09/06/15 0440  INR 1.71* 1.83* 2.28*    Sepsis Markers  Recent Labs Lab 09/03/15 1847  LATICACIDVEN 2.9*    ABG  Recent Labs Lab 09/02/15 0329  PHART 7.431  PCO2ART 56.0*  PO2ART 66.0*    Liver Enzymes  Recent Labs Lab 09/03/15 1847 09/04/15 0255 09/06/15 0440  AST ALT ALKPHOS 60 63 52   BILITOT 0.3 0.3 0.5  ALBUMIN 2.9* 2.5* 2.6*    Cardiac Enzymes  Recent Labs Lab 09/04/15 1443 09/04/15 1908 09/05/15 0130  TROPONINI <0.03 <0.03 0.06*    Glucose  Recent Labs Lab 09/04/15 1659 09/05/15 0749 09/05/15 1129 09/05/15 1652 09/05/15 2147 09/06/15 0738  GLUCAP 152* 204* 198* 231* 202* 188*    Imaging No results found.   STUDIES:   CULTURES: 12/15 Sputum >> 12/15 Blood >> 12/15 Pneumococcal Ag >> negative 12/15 Influenza PCR >> negative  ANTIBIOTICS: 12/15 Rocephin >>12/19 12/15 Zithromax >>12/19 12/19 Levaquin>>>  SIGNIFICANT EVENTS: 12/15 Admit  LINES/TUBES:  DISCUSSION: 74 yo female with acute on chronic hypoxic/hypercapnic respiratory failure from AECOPD.  She has hx of severe COPD/emphysema on chronic prednisone, and 4 liters oxygen at baseline.  She is DNR/DNI.  ASSESSMENT / PLAN:  Acute on chronic hypoxic, hypercapnic respiratory failure. Plan: - oxygen to keep SpO2 88 to 95%   AECOPD. Hx of severe COPD/emphysema. Plan: - pulmicort, brovana, spiriva - PRN albuterol  - f/u CXR intermittently - continue bronchial hygiene - change rocephin/azithro to PO levaquin  - continue solumedrol for now with persistent bronchospasm - consider transition to PO pred 12/20 - low dose morphine prn for dyspnea -outpt pulm f/u   A fib with RVT. Acute on chronic  diastolic dysfx. Hx of DVT. Plan: - rate control, anti-coagulation, diuresis per primary team  Goals of Care. Plan: - DNR/DNI   Dirk DressKaty Carigan Lister, NP 09/06/2015  9:37 AM Pager: [phone] or [phone]) [phone]

## 2015-09-06 NOTE — Progress Notes (Signed)
Occupational Therapy Treatment Patient Details Name: OLAMAE FERRARA MRN: [id number] DOB: 05/13/1941 Today's Date: 09/06/2015    History of present illness YASAMIN KAREL is a 74 y.o. female with PMH of COPD on 4L oxygen at home, dCHF, DVT and atrial fibrillation on coumadin, hypertension, GERD, anemia, who presents with worsening shortness of breath.   OT comments  Pt. With noted improvement with activity tolerance and functional mobility this session.  o2 100-97% with activity, HR 89, and RR 23.  Eager for participation and reports "feeling much better".  Will continue to follow acutely.    Follow Up Recommendations  Home health OT;Supervision/Assistance - 24 hour    Equipment Recommendations  3 in 1 bedside comode    Recommendations for Other Services      Precautions / Restrictions Precautions Precautions: Fall Precaution Comments: watch HR and sats Restrictions Weight Bearing Restrictions: No       Mobility Bed Mobility Overal bed mobility: Modified Independent                Transfers Overall transfer level: Needs assistance Equipment used: None Transfers: Sit to/from Stand;Stand Pivot Transfers Sit to Stand: Min guard Stand pivot transfers: Min guard       General transfer comment: cues for hand placement during pivot transfer to chair    Balance                                   ADL Overall ADL's : Needs assistance/impaired                         Toilet Transfer: Min Manufacturing systems engineer Details (indicate cue type and reason): simulated eob to recliner, pt. denied need for actual [place] [facility] for toileting Toileting- Clothing Manipulation and Hygiene: Min guard;Sit to/from stand Toileting - Clothing Manipulation Details (indicate cue type and reason): simulated during transfer     Functional mobility during ADLs: Min guard General ADL Comments: improvement with activity tolerance per previous doc. pt. reports  feeling much better and states she ate b.fast eob, and then was agreeable to transfer to chair and sit up for a while      Vision                     Perception     Praxis      Cognition   Behavior During Therapy: [facility] for tasks assessed/performed Overall Cognitive Status: Within Functional Limits for tasks assessed                       Extremity/Trunk Assessment               Exercises     Shoulder Instructions       General Comments      Pertinent Vitals/ Pain       Pain Assessment: No/denies pain  Home Living                                          Prior Functioning/Environment              Frequency Min 2X/week     Progress Toward Goals  OT Goals(current goals can now be found in the care plan section)  Progress towards OT goals:  Progressing toward goals     Plan Discharge plan remains appropriate    Co-evaluation                 End of Session     Activity Tolerance Patient tolerated treatment well   Patient Left in chair;with call bell/phone within reach;with nursing/sitter in room (rn in room for iv adjustments)   Nurse Communication          Time: [id number] OT Time Calculation (min): 13 min  Charges: OT General Charges $OT Visit: 1 Procedure OT Treatments $Self Care/Home Management : 8-22 mins  Robet LeuMorris, Morning Halberg Lorraine, COTA/L 09/06/2015, 9:06 AM

## 2015-09-07 LAB — CULTURE, BLOOD (ROUTINE X 2)
Culture: NO GROWTH
Culture: NO GROWTH

## 2015-09-07 LAB — PROTIME-INR
INR: 3.05 — AB (ref 0.00–1.49)
Prothrombin Time: 31 seconds — ABNORMAL HIGH (ref 11.6–15.2)

## 2015-09-07 LAB — CBC
HEMATOCRIT: 30.6 % — AB (ref 36.0–46.0)
HEMOGLOBIN: 9.3 g/dL — AB (ref 12.0–15.0)
MCH: 24.6 pg — ABNORMAL LOW (ref 26.0–34.0)
MCHC: 30.4 g/dL (ref 30.0–36.0)
MCV: 81 fL (ref 78.0–100.0)
Platelets: 362 10*3/uL (ref 150–400)
RBC: 3.78 MIL/uL — AB (ref 3.87–5.11)
RDW: 17.9 % — ABNORMAL HIGH (ref 11.5–15.5)
WBC: 13.3 10*3/uL — ABNORMAL HIGH (ref 4.0–10.5)

## 2015-09-07 LAB — GLUCOSE, CAPILLARY
Glucose-Capillary: 206 mg/dL — ABNORMAL HIGH (ref 65–99)
Glucose-Capillary: 159 mg/dL — ABNORMAL HIGH (ref 65–99)

## 2015-09-07 MED ORDER — LEVOFLOXACIN 500 MG PO TABS: 500.0000 mg | ORAL_TABLET | Freq: Every day | ORAL | Status: DC

## 2015-09-07 MED ORDER — PREDNISONE 10 MG PO TABS: ORAL_TABLET | ORAL | Status: DC

## 2015-09-07 MED ORDER — ALBUTEROL SULFATE (2.5 MG/3ML) 0.083% IN NEBU: 2.5000 mg | INHALATION_SOLUTION | RESPIRATORY_TRACT | Status: AC | PRN

## 2015-09-07 MED ORDER — ARFORMOTEROL TARTRATE 15 MCG/2ML IN NEBU: 15.0000 ug | INHALATION_SOLUTION | Freq: Two times a day (BID) | RESPIRATORY_TRACT | Status: AC

## 2015-09-07 MED ORDER — BUDESONIDE 0.25 MG/2ML IN SUSP: 0.2500 mg | Freq: Two times a day (BID) | RESPIRATORY_TRACT | Status: DC

## 2015-09-07 MED ORDER — DILTIAZEM HCL ER COATED BEADS 180 MG PO CP24: 180.0000 mg | ORAL_CAPSULE | Freq: Every day | ORAL | Status: DC

## 2015-09-07 MED ORDER — WARFARIN SODIUM 2 MG PO TABS: 3.0000 mg | ORAL_TABLET | Freq: Once | ORAL | Status: DC

## 2015-09-07 MED ORDER — WARFARIN SODIUM 2 MG PO TABS: 3.0000 mg | ORAL_TABLET | Freq: Every day | ORAL | Status: DC

## 2015-09-07 MED ORDER — GUAIFENESIN ER 600 MG PO TB12: 1200.0000 mg | ORAL_TABLET | Freq: Two times a day (BID) | ORAL | Status: DC

## 2015-09-07 NOTE — Discharge Summary (Signed)
Physician Discharge Summary  Brenda Wells MRN: [id number] DOB/AGE: 1940-12-20 74 y.o.  PCP: Maryella Shivers, MD   Admit date: 09/02/2015 Discharge date: 09/07/2015  Discharge Diagnoses:     Principal Problem:   Acute on chronic respiratory failure with hypoxia (HCC) Active Problems:   COPD exacerbation (HCC)   Atrial fibrillation with RVR (HCC)   History of DVT (deep vein thrombosis)   Anemia   Shortness of breath   Chronic anticoagulation - Coumadin, CHADS2VASC=4   GERD (gastroesophageal reflux disease)   Chronic diastolic (congestive) heart failure (HCC)    Follow-up recommendations Follow-up with PCP in 3-5 days , including all  additional recommended appointments as below Follow-up CBC, CMP in 3-5 days OPD fu with Dr Lenna Gilford set up - per pulmonary she is at high risk for bounce backs and might benefit from home hospice INR needs to be rechecked on 12/22    Medication List    STOP taking these medications        cefUROXime 500 MG tablet  Commonly known as:  CEFTIN     Fluticasone-Salmeterol 500-50 MCG/DOSE Aepb  Commonly known as:  ADVAIR      TAKE these medications        albuterol (2.5 MG/3ML) 0.083% nebulizer solution  Commonly known as:  PROVENTIL  Take 3 mLs (2.5 mg total) by nebulization every 2 (two) hours as needed for wheezing or shortness of breath.     arformoterol 15 MCG/2ML Nebu  Commonly known as:  BROVANA  Take 2 mLs (15 mcg total) by nebulization 2 (two) times daily.     budesonide 0.25 MG/2ML nebulizer solution  Commonly known as:  PULMICORT  Take 2 mLs (0.25 mg total) by nebulization 2 (two) times daily.     diltiazem 180 MG 24 hr capsule  Commonly known as:  CARDIZEM CD  Take 1 capsule (180 mg total) by mouth daily.     furosemide 40 MG tablet  Commonly known as:  LASIX  Take 40 mg by mouth daily.     guaiFENesin 600 MG 12 hr tablet  Commonly known as:  MUCINEX  Take 2 tablets (1,200 mg total) by mouth 2 (two) times daily.      ipratropium-albuterol 0.5-2.5 (3) MG/3ML Soln  Commonly known as:  DUONEB  Take 3 mLs by nebulization 4 (four) times daily.     levofloxacin 500 MG tablet  Commonly known as:  LEVAQUIN  Take 1 tablet (500 mg total) by mouth daily.     pantoprazole 40 MG tablet  Commonly known as:  PROTONIX  Take 40 mg by mouth daily.     predniSONE 10 MG tablet  Commonly known as:  DELTASONE  10 tab, 5 days, 9 tab, 5 days, 8 tab, 5 days, 7 tab, 5 d , 6 tab, 5 d , 5 tab, 5 d , 4 tab, 5 days, 3 tab, 5 d , 2 tab, 5 d , 1 tab, 5 d     SPIRIVA HANDIHALER 18 MCG inhalation capsule  Generic drug:  tiotropium  Place 18 mcg into inhaler and inhale daily.     warfarin 3 MG tablet  Commonly known as:  COUMADIN  Take 3 mg by mouth daily.         Discharge Condition: Prognosis poor   Discharge Instructions       Discharge Instructions    Diet - low sodium heart healthy    Complete by:  As directed      Increase activity slowly  Complete by:  As directed            No Known Allergies    Disposition: 06-Home-Health Care Svc   Consults: * Pulmonary     Significant Diagnostic Studies:  Ct Abdomen Pelvis W Contrast  09/03/2015  CLINICAL DATA:  Distended abdomen.  Hematuria. EXAM: CT ABDOMEN AND PELVIS WITH CONTRAST TECHNIQUE: Multidetector CT imaging of the abdomen and pelvis was performed using the standard protocol following bolus administration of intravenous contrast. CONTRAST:  155m OMNIPAQUE IOHEXOL 300 MG/ML  SOLN COMPARISON:  07/08/2015 FINDINGS: Lower chest: Patchy airspace densities are noted in the lung bases bilaterally. No pleural fluid. Hepatobiliary: Again noted are multiple liver cysts. Previous cholecystectomy. No biliary dilatation. Pancreas: The pancreas is negative. Spleen: Normal appearance of the spleen. Adrenals/Urinary Tract: The adrenal glands are normal. Indeterminate intermediate attenuating lesion arising from the inferior pole of the left kidney is again  noted measuring 2.2 cm, image 38 of series 2. No kidney stones or obstructive uropathy identified. Bilateral renal vascular calcifications are noted. Urinary bladder is collapsed around a Foley catheter. No bladder calculi noted. Stomach/Bowel: Moderate distension of the stomach. The small bowel loops have a normal course and caliber without obstruction. Moderate stool burden identified throughout the colon. No pathologic dilatation of the large bowel loops identified. Vascular/Lymphatic: Calcified atherosclerotic disease involves the abdominal aorta. The abdominal aorta has a maximum AP dimension of 2.7 cm. No enlarged retroperitoneal or mesenteric adenopathy. No enlarged pelvic or inguinal lymph nodes. Reproductive: Previous hysterectomy.  No adnexal mass noted. Other: No free fluid or fluid collections identified. Musculoskeletal: Degenerative disc disease is noted within the lumbar spine. The bones appear osteopenic. No fractures identified. IMPRESSION: 1. Moderate distension of the gastric lumen. No pathologic dilatation of the small or large bowel loops noted. 2. No findings to explain patient's hematuria. 3. Aortic atherosclerosis. Ectatic abdominal aorta at risk for aneurysm development. Recommend followup by ultrasound in 5 years. This recommendation follows ACR consensus guidelines: White Paper of the ACR Incidental Findings Committee II on Vascular Findings. J Am Coll Radiol 2013; 10:789-794. 4. Indeterminate intermediate attenuating lesion arises from the inferior pole of left kidney. This may represent a hemorrhagic or proteinaceous cyst. A small enhancing renal lesion cannot be excluded. Recommend followup imaging with non-emergent renal protocol MRI or CT. 5. Mild bilateral lower lobe airspace disease. Electronically Signed   By: TKerby MoorsM.D.   On: 09/03/2015 18:36   Dg Chest Port 1 View  09/03/2015  CLINICAL DATA:  Increasing shortness of breath EXAM: PORTABLE CHEST 1 VIEW COMPARISON:   09/02/2015 FINDINGS: Moderate cardiac enlargement. Aortic atherosclerosis. Mild interstitial edema is identified. No airspace consolidation. IMPRESSION: 1. Cardiac enlargement and mild edema. Electronically Signed   By: TKerby MoorsM.D.   On: 09/03/2015 19:46   Dg Chest Port 1 View  09/02/2015  CLINICAL DATA:  Shortness of breath today.  History of COPD. EXAM: PORTABLE CHEST 1 VIEW COMPARISON:  CT chest 07/22/2015.  Chest 07/21/2015. FINDINGS: Mild cardiac enlargement. Pulmonary vascularity is normal. Emphysematous changes in the lungs. Atelectasis in the lung bases. Fluid or thickened pleura in the left costophrenic angle. No focal consolidation. No pneumothorax. Mediastinal contours appear intact. Calcified aorta. IMPRESSION: Cardiac enlargement. Emphysematous changes in the lungs. Small pleural effusion or pleural thickening on the left. No focal airspace disease or consolidation in the lungs. Electronically Signed   By: WLucienne CapersM.D.   On: 09/02/2015 01:17          Filed  Weights   09/05/15 0427 09/06/15 0413 09/07/15 0353  Weight: 60.238 kg (132 lb 12.8 oz) 59.92 kg (132 lb 1.6 oz) 59.966 kg (132 lb 3.2 oz)     Microbiology: Recent Results (from the past 240 hour(s))  Culture, blood (routine x 2) Call MD if unable to obtain prior to antibiotics being given     Status: None (Preliminary result)   Collection Time: 09/02/15  3:28 AM  Result Value Ref Range Status   Specimen Description BLOOD RIGHT ARM  Final   Special Requests BOTTLES DRAWN AEROBIC AND ANAEROBIC 5ML  Final   Culture NO GROWTH 4 DAYS  Final   Report Status PENDING  Incomplete  Culture, blood (routine x 2) Call MD if unable to obtain prior to antibiotics being given     Status: None (Preliminary result)   Collection Time: 09/02/15  3:30 AM  Result Value Ref Range Status   Specimen Description BLOOD LEFT ARM  Final   Special Requests BOTTLES DRAWN AEROBIC AND ANAEROBIC 5ML  Final   Culture NO GROWTH 4 DAYS   Final   Report Status PENDING  Incomplete  MRSA PCR Screening     Status: Abnormal   Collection Time: 09/02/15  1:16 PM  Result Value Ref Range Status   MRSA by PCR POSITIVE (A) NEGATIVE Final    Comment:        The GeneXpert MRSA Assay (FDA approved for NASAL specimens only), is one component of a comprehensive MRSA colonization surveillance program. It is not intended to diagnose MRSA infection nor to guide or monitor treatment for MRSA infections. RESULT CALLED TO, READ BACK BY AND VERIFIED WITH: J. LAUER RN 15:45 09/02/15 ([email or web address])        Blood Culture    Component Value Date/Time   SDES BLOOD LEFT ARM 09/02/2015 0330   SPECREQUEST BOTTLES DRAWN AEROBIC AND ANAEROBIC 5ML 09/02/2015 0330   CULT NO GROWTH 4 DAYS 09/02/2015 0330   REPTSTATUS PENDING 09/02/2015 0330      Labs: Results for orders placed or performed during the hospital encounter of 09/02/15 (from the past 48 hour(s))  Lipase, blood     Status: None   Collection Time: 09/05/15  2:30 PM  Result Value Ref Range   Lipase 51 11 - 51 U/L  Glucose, capillary     Status: Abnormal   Collection Time: 09/05/15  4:52 PM  Result Value Ref Range   Glucose-Capillary 231 (H) 65 - 99 mg/dL  Glucose, capillary     Status: Abnormal   Collection Time: 09/05/15  9:47 PM  Result Value Ref Range   Glucose-Capillary 202 (H) 65 - 99 mg/dL  CBC     Status: Abnormal   Collection Time: 09/06/15  4:40 AM  Result Value Ref Range   WBC 13.3 (H) 4.0 - 10.5 K/uL   RBC 3.80 (L) 3.87 - 5.11 MIL/uL   Hemoglobin 8.9 (L) 12.0 - 15.0 g/dL   HCT 30.6 (L) 36.0 - 46.0 %   MCV 80.5 78.0 - 100.0 fL   MCH 23.4 (L) 26.0 - 34.0 pg   MCHC 29.1 (L) 30.0 - 36.0 g/dL   RDW 17.8 (H) 11.5 - 15.5 %   Platelets 365 150 - 400 K/uL  Protime-INR     Status: Abnormal   Collection Time: 09/06/15  4:40 AM  Result Value Ref Range   Prothrombin Time 24.9 (H) 11.6 - 15.2 seconds   INR 2.28 (H) 0.00 - 1.49  Comprehensive metabolic panel  Status:  Abnormal   Collection Time: 09/06/15  4:40 AM  Result Value Ref Range   Sodium 135 135 - 145 mmol/L   Potassium 4.3 3.5 - 5.1 mmol/L   Chloride 88 (L) 101 - 111 mmol/L   CO2 37 (H) 22 - 32 mmol/L   Glucose, Bld 196 (H) 65 - 99 mg/dL   BUN 29 (H) 6 - 20 mg/dL   Creatinine, Ser 0.90 0.44 - 1.00 mg/dL   Calcium 8.3 (L) 8.9 - 10.3 mg/dL   Total Protein 5.2 (L) 6.5 - 8.1 g/dL   Albumin 2.6 (L) 3.5 - 5.0 g/dL   AST 18 15 - 41 U/L   ALT 22 14 - 54 U/L   Alkaline Phosphatase 52 38 - 126 U/L   Total Bilirubin 0.5 0.3 - 1.2 mg/dL   GFR calc non Af Amer >60 >60 mL/min   GFR calc Af Amer >60 >60 mL/min    Comment: (NOTE) The eGFR has been calculated using the CKD EPI equation. This calculation has not been validated in all clinical situations. eGFR's persistently <60 mL/min signify possible Chronic Kidney Disease.    Anion gap 10 5 - 15  Glucose, capillary     Status: Abnormal   Collection Time: 09/06/15  7:38 AM  Result Value Ref Range   Glucose-Capillary 188 (H) 65 - 99 mg/dL  Glucose, capillary     Status: Abnormal   Collection Time: 09/06/15 11:13 AM  Result Value Ref Range   Glucose-Capillary 188 (H) 65 - 99 mg/dL  Glucose, capillary     Status: Abnormal   Collection Time: 09/06/15  4:32 PM  Result Value Ref Range   Glucose-Capillary 235 (H) 65 - 99 mg/dL   Comment 1 Notify RN    Comment 2 Document in Chart   Glucose, capillary     Status: Abnormal   Collection Time: 09/06/15  9:18 PM  Result Value Ref Range   Glucose-Capillary 167 (H) 65 - 99 mg/dL  CBC     Status: Abnormal   Collection Time: 09/07/15  5:05 AM  Result Value Ref Range   WBC 13.3 (H) 4.0 - 10.5 K/uL   RBC 3.78 (L) 3.87 - 5.11 MIL/uL   Hemoglobin 9.3 (L) 12.0 - 15.0 g/dL   HCT 30.6 (L) 36.0 - 46.0 %   MCV 81.0 78.0 - 100.0 fL   MCH 24.6 (L) 26.0 - 34.0 pg   MCHC 30.4 30.0 - 36.0 g/dL   RDW 17.9 (H) 11.5 - 15.5 %   Platelets 362 150 - 400 K/uL  Protime-INR     Status: Abnormal   Collection Time:  09/07/15  5:05 AM  Result Value Ref Range   Prothrombin Time 31.0 (H) 11.6 - 15.2 seconds   INR 3.05 (H) 0.00 - 1.49  Glucose, capillary     Status: Abnormal   Collection Time: 09/07/15  7:38 AM  Result Value Ref Range   Glucose-Capillary 206 (H) 65 - 99 mg/dL  Glucose, capillary     Status: Abnormal   Collection Time: 09/07/15 11:27 AM  Result Value Ref Range   Glucose-Capillary 159 (H) 65 - 99 mg/dL     Lipid Panel  No results found for: CHOL, TRIG, HDL, CHOLHDL, VLDL, LDLCALC, LDLDIRECT   Lab Results  Component Value Date   HGBA1C 7.0* 09/04/2015     Lab Results  Component Value Date   CREATININE 0.90 09/06/2015     HPI : 74 y.o. female with PMH of COPD on  4L oxygen at home, dCHF, DVT and atrial fibrillation on coumadin, hypertension, GERD, anemia, who presents with worsening shortness of breath.  Patient reports that she has been having shortness of breath in the past several days, which has been progressively getting worse. She has dry cough and wheezing. No chest pain, fever or chills. She denies runny nose or sore throat. She does not have leg edema. No abdominal pain, diarrhea, UTI, lateral weakness. She states that she has been compliant to her Lasix and Coumadin.  In ED, patient was found to have WBC 14.0, temperature normal, tachycardia, tachypnea, potassium 3.3, renal function okay, INR 1.29, troponin negative, pending urinalysis and BNP. CXR showed emphysematous change, no pneumonia. Patient's committed to inpatient for further eval and treatment.  HOSPITAL COURSE:   Acute on chronic respiratory failure with hypoxia [facility]): most likely due COPD exacerbation given diffused wheezing. Less likely to have a CHF exacerbation given no any leg edema.  Recent CT scan 07/22/15 showed centrilobular emphysema, no PE, patient hospitalized from 11/2-11/8. Discharged on prednisone, was supposed to follow-up with Dr. Asencion Noble in 2-3 weeks,therefore pulmonary consulted  inpatient  Needed  BiPAP support, for 48hrs ,alternating with high flow nasal cannula pulmicort, brovana, spiriva started Solu-Medrol   Switched  to prednisone 12/20 Rocephin/azithromycin switched to Levaquin for 5 more days  -Mucinex for cough  -Urine S. pneumococcal antigen negative Negative blood culture x2, sputum culture, Flu pcr-negative -BiPAP as needed for work of breathing ABG, pH 7.43, PCO2 56, PO2 66 Dc with home health and 4L [place]   Abdominal pain likely secondary to constipation, started on stool softener, no BM yet , will try enema  Atrial Fibrillation with RVR: CHA2DS2-VASc Score is 4 , needs oral anticoagulation. Patient was on Coumadin at home , but INR is 1.29 on admission. Now OFF heparin /Cardizemdrip, due to hematuria , Restarted Coumadin, INR 3.05 INR needs to be rechecked on 12/22  diltiazem 180 mg by mouth daily   Hematuria-resolved Patient developed sudden distension of her abdomen on 12/16 Heparin discontinued, CT abdomen pelvis , no obvious reason identified, may have a few renal cycts that need to be evaluated with MRI  Hemoglobin stable  History of DVT (deep vein thrombosis): -see above  Anemia: hgb stable. hgb 8.5 on 07/27/15-->9.7.> 8.5 -f/u CBC closely   GERD: -Protonix  Chronic diastolic (congestive) heart failure ([place]): Echo 07/22/15 showed a year for 50-55%. Patient is taking Lasix to 40 mg daily. No leg edema. CHF is compensated. -continue Lasix    Discharge Exam:    Blood pressure 133/69, pulse 103, temperature 97.7 F (36.5 C), temperature source Oral, resp. rate 27, height 5' 5" (1.651 m), weight 59.966 kg (132 lb 3.2 oz), SpO2 100 %.      Follow-up Information    Follow up with Noralee Space, MD On 10/05/2015.   Specialty:  Pulmonary Disease   Why:  9:00am - [facility]    Contact information:   [place] [address] [phone]       Schedule an appointment as soon as possible for a visit in 3  days to follow up.      SignedReyne Dumas 09/07/2015, 1:04 PM        Time spent >45 mins

## 2015-09-07 NOTE — Progress Notes (Signed)
ANTICOAGULATION CONSULT NOTE - Follow Up Consult  Pharmacy Consult for Coumadin Indication: atrial fibrillation and DVT   No Known Allergies  Patient Measurements: Height: 5\' 5"  (165.1 cm) Weight: 132 lb 3.2 oz (59.966 kg) IBW/kg (Calculated) : 57  Vital Signs: Temp: 98.5 F (36.9 C) (12/20 0740) Temp Source: Oral (12/20 0740) BP: 133/69 mmHg (12/20 0740) Pulse Rate: 103 (12/20 0740)  Labs:  Recent Labs  09/04/15 1443 09/04/15 1908  09/05/15 0130 09/06/15 0440 09/07/15 0505  HGB  --   --   < > 8.2* 8.9* 9.3*  HCT  --   --   --  28.5* 30.6* 30.6*  PLT  --   --   --  384 365 362  LABPROT  --   --   --  21.1* 24.9* 31.0*  INR  --   --   --  1.83* 2.28* 3.05*  CREATININE  --   --   --  1.10* 0.90  --   TROPONINI <0.03 <0.03  --  0.06*  --   --   < > = values in this interval not displayed.  Estimated Creatinine Clearance: 50.1 mL/min (by C-G formula based on Cr of 0.9).  Assessment:   Hx DVT and afib. Recent hematuria, now resolved.   INR just above target range (3.05). Coumadin resumed on 12/17 with 5 mg; 7.5 mg given 12/18, then usual 3 mg dose on 12/19, as INR rise expected today due to bigger than usual doses for 2 days.   Home Coumadin regimen: 3 mg daily (though INR subtherapeutic on admit).    Azithromycin/Ceftraixone transitioned to Levaquin on 12/19. Sometimes effects INR.  Goal of Therapy:  INR 2-3 Monitor platelets by anticoagulation protocol: Yes   Plan:   Resume home Coumadin regimen of 3 mg daily.  Continue daily PT/INR.  Watch for Levaquin effect on INR.  Dennie FettersEgan, Grady Mohabir Donovan, RPh Pager: [phone] 09/07/2015,11:04 AM

## 2015-09-07 NOTE — Progress Notes (Signed)
Pt refuse BiPAP tonight, pt is stable at this time.

## 2015-09-07 NOTE — Plan of Care (Signed)
Problem: Phase I Progression Outcomes Goal: Progress activity as tolerated unless otherwise ordered Outcome: Progressing Pt is progressing adequately and is beginning to tolerate it Goal: Tolerating diet Outcome: Completed/Met Date Met:  09/07/15 Pt is able to fully eat meals with no problems at this time  Problem: Phase II Progression Outcomes Goal: Dyspnea controlled w/progressive activity Outcome: Progressing Pt having some improvement in [facility] with activity Goal: ADLs completed with minimal assistance Outcome: Progressing Pt able to complete more ADLs by herself Goal: Tolerating diet Outcome: Completed/Met Date Met:  09/07/15 Pt able to tolerate diet at this time

## 2015-09-07 NOTE — Progress Notes (Signed)
Inpatient Diabetes Program Recommendations  AACE/ADA: New Consensus Statement on Inpatient Glycemic Control (2015)  Target Ranges:  Prepandial:   less than 140 mg/dL      Peak postprandial:   less than 180 mg/dL (1-2 hours)      Critically ill patients:  140 - 180 mg/dL   Results for Harlow AsaKENNEDY, Donell G (MRN [id number]) as of 09/07/2015 09:24  Ref. Range 09/06/2015 07:38 09/06/2015 11:13 09/06/2015 16:32 09/06/2015 21:18 09/07/2015 07:38  Glucose-Capillary Latest Ref Range: 65-99 mg/dL 952188 (H) 841188 (H) 324235 (H) 167 (H) 206 (H)   Review of Glycemic Control  Diabetes history: None/Steroid induce hyperglycemia Current orders for Inpatient glycemic control: Novolog Sensitive TID  Inpatient Diabetes Program Recommendations: Correction (SSI): Glucose range 188-230's. Patient still on IV solumedrol 60 mg Q12hrs, please consider increasing correction to Novolog Moderate while inpatient.  Thanks,  Christena DeemShannon Nathan Stallworth RN, MSN, [facility] Inpatient Diabetes Coordinator Team Pager [phone] (8a-5p)

## 2015-09-07 NOTE — Plan of Care (Signed)
Problem: Fluid Volume: Goal: Ability to maintain a balanced intake and output will improve Outcome: Completed/Met Date Met:  09/07/15 Pt able to maintain adequate intake and output at this time

## 2015-09-09 ENCOUNTER — Encounter (HOSPITAL_COMMUNITY): Payer: Self-pay | Admitting: Neurology

## 2015-09-09 ENCOUNTER — Emergency Department (HOSPITAL_COMMUNITY): Payer: Medicare Other

## 2015-09-09 ENCOUNTER — Inpatient Hospital Stay (HOSPITAL_COMMUNITY)
Admission: EM | Admit: 2015-09-09 | Discharge: 2015-09-14 | DRG: 190 | Disposition: A | Discharge status: Home / Self Care | Payer: Medicare Other | Attending: Internal Medicine | Admitting: Internal Medicine

## 2015-09-09 DIAGNOSIS — T380X5A Adverse effect of glucocorticoids and synthetic analogues, initial encounter: Secondary | ICD-10-CM | POA: Diagnosis present

## 2015-09-09 DIAGNOSIS — Z7901 Long term (current) use of anticoagulants: Secondary | ICD-10-CM | POA: Diagnosis not present

## 2015-09-09 DIAGNOSIS — Z8249 Family history of ischemic heart disease and other diseases of the circulatory system: Secondary | ICD-10-CM

## 2015-09-09 DIAGNOSIS — J9621 Acute and chronic respiratory failure with hypoxia: Secondary | ICD-10-CM | POA: Diagnosis present

## 2015-09-09 DIAGNOSIS — I482 Chronic atrial fibrillation, unspecified: Secondary | ICD-10-CM | POA: Diagnosis present

## 2015-09-09 DIAGNOSIS — Z86718 Personal history of other venous thrombosis and embolism: Secondary | ICD-10-CM

## 2015-09-09 DIAGNOSIS — K219 Gastro-esophageal reflux disease without esophagitis: Secondary | ICD-10-CM | POA: Diagnosis present

## 2015-09-09 DIAGNOSIS — J9622 Acute and chronic respiratory failure with hypercapnia: Secondary | ICD-10-CM | POA: Diagnosis present

## 2015-09-09 DIAGNOSIS — I110 Hypertensive heart disease with heart failure: Secondary | ICD-10-CM | POA: Diagnosis present

## 2015-09-09 DIAGNOSIS — R739 Hyperglycemia, unspecified: Secondary | ICD-10-CM

## 2015-09-09 DIAGNOSIS — I5032 Chronic diastolic (congestive) heart failure: Secondary | ICD-10-CM | POA: Diagnosis present

## 2015-09-09 DIAGNOSIS — J441 Chronic obstructive pulmonary disease with (acute) exacerbation: Secondary | ICD-10-CM | POA: Diagnosis present

## 2015-09-09 DIAGNOSIS — E1165 Type 2 diabetes mellitus with hyperglycemia: Secondary | ICD-10-CM | POA: Diagnosis present

## 2015-09-09 DIAGNOSIS — Z79899 Other long term (current) drug therapy: Secondary | ICD-10-CM

## 2015-09-09 DIAGNOSIS — R0602 Shortness of breath: Secondary | ICD-10-CM

## 2015-09-09 DIAGNOSIS — F17200 Nicotine dependence, unspecified, uncomplicated: Secondary | ICD-10-CM | POA: Diagnosis present

## 2015-09-09 DIAGNOSIS — Z90710 Acquired absence of both cervix and uterus: Secondary | ICD-10-CM

## 2015-09-09 DIAGNOSIS — D649 Anemia, unspecified: Secondary | ICD-10-CM | POA: Diagnosis present

## 2015-09-09 DIAGNOSIS — Z66 Do not resuscitate: Secondary | ICD-10-CM | POA: Diagnosis present

## 2015-09-09 DIAGNOSIS — Z7951 Long term (current) use of inhaled steroids: Secondary | ICD-10-CM

## 2015-09-09 LAB — I-STAT ARTERIAL BLOOD GAS, ED
ACID-BASE EXCESS: 13 mmol/L — AB (ref 0.0–2.0)
Bicarbonate: 39 mEq/L — ABNORMAL HIGH (ref 20.0–24.0)
O2 Saturation: 98 %
PO2 ART: 98 mmHg (ref 80.0–100.0)
TCO2: 41 mmol/L (ref 0–100)
pCO2 arterial: 52 mmHg — ABNORMAL HIGH (ref 35.0–45.0)
pH, Arterial: 7.483 — ABNORMAL HIGH (ref 7.350–7.450)

## 2015-09-09 LAB — I-STAT TROPONIN, ED: TROPONIN I, POC: 0.02 ng/mL (ref 0.00–0.08)

## 2015-09-09 LAB — CBC WITH DIFFERENTIAL/PLATELET
BASOS ABS: 0 10*3/uL (ref 0.0–0.1)
BASOS PCT: 0 %
Eosinophils Absolute: 0 10*3/uL (ref 0.0–0.7)
Eosinophils Relative: 0 %
HEMATOCRIT: 37.3 % (ref 36.0–46.0)
Hemoglobin: 10.8 g/dL — ABNORMAL LOW (ref 12.0–15.0)
Lymphocytes Relative: 4 %
Lymphs Abs: 0.8 10*3/uL (ref 0.7–4.0)
MCH: 23.1 pg — ABNORMAL LOW (ref 26.0–34.0)
MCHC: 29 g/dL — ABNORMAL LOW (ref 30.0–36.0)
MCV: 79.9 fL (ref 78.0–100.0)
MONO ABS: 0.8 10*3/uL (ref 0.1–1.0)
Monocytes Relative: 4 %
NEUTROS ABS: 18.5 10*3/uL — AB (ref 1.7–7.7)
NEUTROS PCT: 92 %
Platelets: 426 10*3/uL — ABNORMAL HIGH (ref 150–400)
RBC: 4.67 MIL/uL (ref 3.87–5.11)
RDW: 18.1 % — AB (ref 11.5–15.5)
WBC: 20.1 10*3/uL — ABNORMAL HIGH (ref 4.0–10.5)

## 2015-09-09 LAB — I-STAT CG4 LACTIC ACID, ED
Lactic Acid, Venous: 2.76 mmol/L (ref 0.5–2.0)
Lactic Acid, Venous: 1.72 mmol/L (ref 0.5–2.0)

## 2015-09-09 LAB — PROTIME-INR
INR: 1.56 — AB (ref 0.00–1.49)
PROTHROMBIN TIME: 18.7 s — AB (ref 11.6–15.2)

## 2015-09-09 LAB — CBG MONITORING, ED: GLUCOSE-CAPILLARY: 248 mg/dL — AB (ref 65–99)

## 2015-09-09 LAB — BASIC METABOLIC PANEL
ANION GAP: 16 — AB (ref 5–15)
BUN: 34 mg/dL — ABNORMAL HIGH (ref 6–20)
CALCIUM: 8.5 mg/dL — AB (ref 8.9–10.3)
CO2: 31 mmol/L (ref 22–32)
Chloride: 86 mmol/L — ABNORMAL LOW (ref 101–111)
Creatinine, Ser: 0.94 mg/dL (ref 0.44–1.00)
GFR, EST NON AFRICAN AMERICAN: 59 mL/min — AB (ref >60)
GLUCOSE: 301 mg/dL — AB (ref 65–99)
Potassium: 5 mmol/L (ref 3.5–5.1)
Sodium: 133 mmol/L — ABNORMAL LOW (ref 135–145)

## 2015-09-09 LAB — GLUCOSE, CAPILLARY: GLUCOSE-CAPILLARY: 238 mg/dL — AB (ref 65–99)

## 2015-09-09 LAB — BRAIN NATRIURETIC PEPTIDE: B NATRIURETIC PEPTIDE 5: 102.1 pg/mL — AB (ref 0.0–100.0)

## 2015-09-09 MED ORDER — WARFARIN SODIUM 4 MG PO TABS
4.0000 mg | ORAL_TABLET | Freq: Once | ORAL | Status: AC
  Administered 2015-09-09: 4 mg via ORAL
  Filled 2015-09-09: qty 1

## 2015-09-09 MED ORDER — INSULIN ASPART 100 UNIT/ML ~~LOC~~ SOLN
0.0000 [IU] | Freq: Every day | SUBCUTANEOUS | Status: DC
  Administered 2015-09-09 – 2015-09-13 (×3): 2 [IU] via SUBCUTANEOUS

## 2015-09-09 MED ORDER — SODIUM CHLORIDE 0.9 % IV SOLN
INTRAVENOUS | Status: DC
  Administered 2015-09-09 – 2015-09-11 (×2): via INTRAVENOUS

## 2015-09-09 MED ORDER — BUDESONIDE 0.25 MG/2ML IN SUSP
0.2500 mg | Freq: Two times a day (BID) | RESPIRATORY_TRACT | Status: DC
  Administered 2015-09-09 – 2015-09-14 (×10): 0.25 mg via RESPIRATORY_TRACT
  Filled 2015-09-09 (×10): qty 2

## 2015-09-09 MED ORDER — IPRATROPIUM-ALBUTEROL 0.5-2.5 (3) MG/3ML IN SOLN
3.0000 mL | RESPIRATORY_TRACT | Status: DC
  Administered 2015-09-09 (×2): 3 mL via RESPIRATORY_TRACT
  Filled 2015-09-09 (×2): qty 3

## 2015-09-09 MED ORDER — WARFARIN - PHARMACIST DOSING INPATIENT: Freq: Every day | Status: DC

## 2015-09-09 MED ORDER — ACETAMINOPHEN 650 MG RE SUPP: 650.0000 mg | Freq: Four times a day (QID) | RECTAL | Status: DC | PRN

## 2015-09-09 MED ORDER — METHYLPREDNISOLONE SODIUM SUCC 125 MG IJ SOLR
60.0000 mg | Freq: Four times a day (QID) | INTRAMUSCULAR | Status: DC
  Administered 2015-09-09 – 2015-09-10 (×3): 60 mg via INTRAVENOUS
  Filled 2015-09-09 (×3): qty 2

## 2015-09-09 MED ORDER — PANTOPRAZOLE SODIUM 40 MG PO TBEC
40.0000 mg | DELAYED_RELEASE_TABLET | Freq: Every day | ORAL | Status: DC
  Administered 2015-09-09 – 2015-09-14 (×6): 40 mg via ORAL
  Filled 2015-09-09 (×6): qty 1

## 2015-09-09 MED ORDER — SODIUM CHLORIDE 0.9 % IJ SOLN
3.0000 mL | Freq: Two times a day (BID) | INTRAMUSCULAR | Status: DC
  Administered 2015-09-10: 3 mL via INTRAVENOUS
  Administered 2015-09-11: 10 mL via INTRAVENOUS
  Administered 2015-09-12 – 2015-09-14 (×5): 3 mL via INTRAVENOUS

## 2015-09-09 MED ORDER — LEVOFLOXACIN IN D5W 500 MG/100ML IV SOLN
500.0000 mg | INTRAVENOUS | Status: DC
  Administered 2015-09-11: 500 mg via INTRAVENOUS
  Filled 2015-09-09 (×2): qty 100

## 2015-09-09 MED ORDER — SODIUM CHLORIDE 0.9 % IV BOLUS (SEPSIS)
1000.0000 mL | Freq: Once | INTRAVENOUS | Status: AC
  Administered 2015-09-09: 1000 mL via INTRAVENOUS

## 2015-09-09 MED ORDER — METHYLPREDNISOLONE SODIUM SUCC 125 MG IJ SOLR
125.0000 mg | Freq: Once | INTRAMUSCULAR | Status: AC
  Administered 2015-09-09: 125 mg via INTRAVENOUS
  Filled 2015-09-09: qty 2

## 2015-09-09 MED ORDER — LEVOFLOXACIN IN D5W 500 MG/100ML IV SOLN
500.0000 mg | INTRAVENOUS | Status: DC
  Administered 2015-09-09: 500 mg via INTRAVENOUS
  Filled 2015-09-09: qty 100

## 2015-09-09 MED ORDER — INSULIN ASPART 100 UNIT/ML ~~LOC~~ SOLN
0.0000 [IU] | Freq: Three times a day (TID) | SUBCUTANEOUS | Status: DC
  Administered 2015-09-09 – 2015-09-10 (×3): 5 [IU] via SUBCUTANEOUS
  Administered 2015-09-10: 8 [IU] via SUBCUTANEOUS
  Administered 2015-09-11: 3 [IU] via SUBCUTANEOUS
  Administered 2015-09-11: 5 [IU] via SUBCUTANEOUS
  Administered 2015-09-12: 3 [IU] via SUBCUTANEOUS
  Administered 2015-09-12: 2 [IU] via SUBCUTANEOUS
  Administered 2015-09-12: 3 [IU] via SUBCUTANEOUS
  Administered 2015-09-13: 2 [IU] via SUBCUTANEOUS
  Filled 2015-09-09: qty 1

## 2015-09-09 MED ORDER — LEVALBUTEROL HCL 0.63 MG/3ML IN NEBU
0.6300 mg | INHALATION_SOLUTION | Freq: Four times a day (QID) | RESPIRATORY_TRACT | Status: DC
  Administered 2015-09-09 – 2015-09-10 (×3): 0.63 mg via RESPIRATORY_TRACT
  Filled 2015-09-09 (×6): qty 3

## 2015-09-09 MED ORDER — GUAIFENESIN ER 600 MG PO TB12
1200.0000 mg | ORAL_TABLET | Freq: Two times a day (BID) | ORAL | Status: DC
  Administered 2015-09-09 – 2015-09-14 (×10): 1200 mg via ORAL
  Filled 2015-09-09 (×10): qty 2

## 2015-09-09 MED ORDER — DOCUSATE SODIUM 100 MG PO CAPS
100.0000 mg | ORAL_CAPSULE | Freq: Two times a day (BID) | ORAL | Status: DC
  Administered 2015-09-09 – 2015-09-14 (×8): 100 mg via ORAL
  Filled 2015-09-09 (×10): qty 1

## 2015-09-09 MED ORDER — ARFORMOTEROL TARTRATE 15 MCG/2ML IN NEBU
15.0000 ug | INHALATION_SOLUTION | Freq: Two times a day (BID) | RESPIRATORY_TRACT | Status: DC
  Administered 2015-09-09 – 2015-09-14 (×10): 15 ug via RESPIRATORY_TRACT
  Filled 2015-09-09 (×10): qty 2

## 2015-09-09 MED ORDER — TIOTROPIUM BROMIDE MONOHYDRATE 18 MCG IN CAPS
18.0000 ug | ORAL_CAPSULE | Freq: Every day | RESPIRATORY_TRACT | Status: DC
  Administered 2015-09-10 – 2015-09-14 (×5): 18 ug via RESPIRATORY_TRACT
  Filled 2015-09-09: qty 5

## 2015-09-09 MED ORDER — DILTIAZEM HCL ER COATED BEADS 180 MG PO CP24
180.0000 mg | ORAL_CAPSULE | Freq: Every day | ORAL | Status: DC
  Administered 2015-09-09 – 2015-09-11 (×2): 180 mg via ORAL
  Filled 2015-09-09 (×3): qty 1

## 2015-09-09 MED ORDER — ACETAMINOPHEN 325 MG PO TABS: 650.0000 mg | ORAL_TABLET | Freq: Four times a day (QID) | ORAL | Status: DC | PRN

## 2015-09-09 NOTE — Progress Notes (Signed)
ANTICOAGULATION CONSULT NOTE - Initial Consult  Pharmacy Consult for Coumadin Indication: atrial fibrillation  No Known Allergies  Patient Measurements: TBW 60 kg  Vital Signs: Temp: 98.3 F (36.8 C) (12/22 1328) Temp Source: Oral (12/22 1328) BP: 128/88 mmHg (12/22 1630) Pulse Rate: 55 (12/22 1630)  Labs:  Recent Labs  09/07/15 0505 09/09/15 1350  HGB 9.3* 10.8*  HCT 30.6* 37.3  PLT 362 426*  LABPROT 31.0*  --   INR 3.05*  --   CREATININE  --  0.94    Estimated Creatinine Clearance: 48 mL/min (by C-G formula based on Cr of 0.94).   Medical History: Past Medical History  Diagnosis Date  . Acute respiratory failure (HCC)   . Community acquired pneumonia   . COPD with acute exacerbation (HCC)   . Anemia   . Hypokalemia   . Hyponatremia   . CHF (congestive heart failure) (HCC)   . Hypertension   . Shortness of breath dyspnea   . History of kidney stones     Assessment: 74 yo F presents on 12/22 with SOB. PMH significant for COPD, CHF, HTN, and Afib (on coumadin). PTA coumadin dose is 3mg  daily. Last dose taken was in the morning of 12/21. INR on 12/20 was 3.05, repeat INR today is low at 1.56. No missed doses reported. Hgb low at 10.8, plts 426. No s/s of bleed. Levaquin also started so will monitor closely.  Goal of Therapy:  INR 2-3 Monitor platelets by anticoagulation protocol: Yes  Plan:  Give coumadin 4mg  tonight Monitor daily INR, CBC, s/s of bleed  Enzo BiNathan Kianah Harries, PharmD, [facility] Clinical Pharmacist Pager [phone] 09/09/2015 5:16 PM

## 2015-09-09 NOTE — Progress Notes (Signed)
Pt stated she does not want to wear BiPAP at this time and if she changes her mind during the night she will call for RT

## 2015-09-09 NOTE — Progress Notes (Signed)
CSW spoke with pt (family not present) re: [place]/different types of Medicaid coverage.  ALF also provided per family request.  Pt informed to ask RN to call CSW if pt/family have any further questions.

## 2015-09-09 NOTE — ED Provider Notes (Signed)
CSN: [id number]     Arrival date & time 09/09/15  1323 History   First MD Initiated Contact with Patient 09/09/15 1334     Chief Complaint  Patient presents with  . Shortness of Breath   74 yo F w/PMH of COPD, CHF, and HTN who presents w/SOB and hypoxia. Pt was just discharaged from hospital for COPD exacerbation (12/15-12/20) and initially felt well. However, this AM awoke w/severe SOB after a nap. Went to PCP's where they found her hypoxic despite increased O2 (baseline 4L [place], up to 6L [place]). She had taken her home meds (prednisone, breathing treatments) but despite this, had no improvement in SOB. Of note, she was [facility] home w/Rx for PO levaquin, but family says she hasn't gotten this filled yet. She denies CP, SOB, fever, chills, N/V, diarrhea, constipation, hematemesis, dysuria, hematuria, sick contacts, or recent travel.   Patient is a 74 y.o. female presenting with shortness of breath. The history is provided by the patient.  Shortness of Breath Severity:  Moderate Onset quality:  Sudden Duration:  4 hours Timing:  Constant Progression:  Unchanged Chronicity:  Recurrent Relieved by:  Nothing Ineffective treatments:  Inhaler Associated symptoms: cough (chronic) and wheezing   Associated symptoms: no abdominal pain, no chest pain, no diaphoresis, no fever, no headaches, no rash, no syncope and no vomiting     Past Medical History  Diagnosis Date  . Acute respiratory failure (HCC)   . Community acquired pneumonia   . COPD with acute exacerbation (HCC)   . Anemia   . Hypokalemia   . Hyponatremia   . CHF (congestive heart failure) (HCC)   . Hypertension   . Shortness of breath dyspnea   . History of kidney stones    Past Surgical History  Procedure Laterality Date  . Back surgery    . Cyst excision      base of tongue  . Cholecystectomy    . Abdominal hysterectomy     Family History  Problem Relation Age of Onset  . Diabetes Mellitus II Mother   . CAD Mother   . CAD  Brother    Social History  Substance Use Topics  . Smoking status: Current Every Day Smoker -- 60 years    Types: Cigarettes  . Smokeless tobacco: Never Used     Comment: I smoke 1 cigarette a day "  . Alcohol Use: No   OB History    No data available     Review of Systems  Constitutional: Negative for fever, chills and diaphoresis.  HENT: Negative for congestion.   Respiratory: Positive for cough (chronic), shortness of breath and wheezing. Negative for apnea, choking, chest tightness and stridor.        NO hemoptysis  Cardiovascular: Negative for chest pain, palpitations, leg swelling and syncope.  Gastrointestinal: Negative for nausea, vomiting, abdominal pain, diarrhea, constipation and abdominal distention.  Genitourinary: Negative for dysuria, frequency, flank pain and decreased urine volume.  Skin: Negative for rash.  Neurological: Negative for dizziness, speech difficulty, light-headedness and headaches.  All other systems reviewed and are negative.     Allergies  Review of patient's allergies indicates no known allergies.  Home Medications   Prior to Admission medications   Medication Sig Start Date End Date Taking? Authorizing Provider  albuterol (PROVENTIL) (2.5 MG/3ML) 0.083% nebulizer solution Take 3 mLs (2.5 mg total) by nebulization every 2 (two) hours as needed for wheezing or shortness of breath. 09/07/15  Yes Richarda Overlie, MD  arformoterol (  BROVANA) 15 MCG/2ML NEBU Take 2 mLs (15 mcg total) by nebulization 2 (two) times daily. 09/07/15  Yes Richarda OverlieNayana Abrol, MD  budesonide (PULMICORT) 0.25 MG/2ML nebulizer solution Take 2 mLs (0.25 mg total) by nebulization 2 (two) times daily. 09/07/15  Yes Richarda OverlieNayana Abrol, MD  diltiazem (CARDIZEM CD) 180 MG 24 hr capsule Take 1 capsule (180 mg total) by mouth daily. 09/07/15  Yes Richarda OverlieNayana Abrol, MD  furosemide (LASIX) 40 MG tablet Take 40 mg by mouth daily.   Yes Historical Provider, MD  guaiFENesin (MUCINEX) 600 MG 12 hr tablet  Take 2 tablets (1,200 mg total) by mouth 2 (two) times daily. 09/07/15  Yes Richarda OverlieNayana Abrol, MD  ipratropium-albuterol (DUONEB) 0.5-2.5 (3) MG/3ML SOLN Take 3 mLs by nebulization 4 (four) times daily.   Yes Historical Provider, MD  levofloxacin (LEVAQUIN) 500 MG tablet Take 1 tablet (500 mg total) by mouth daily. 09/07/15  Yes Richarda OverlieNayana Abrol, MD  pantoprazole (PROTONIX) 40 MG tablet Take 40 mg by mouth daily.   Yes Historical Provider, MD  predniSONE (DELTASONE) 10 MG tablet 10 tab, 5 days, 9 tab, 5 days, 8 tab, 5 days, 7 tab, 5 d , 6 tab, 5 d , 5 tab, 5 d , 4 tab, 5 days, 3 tab, 5 d , 2 tab, 5 d , 1 tab, 5 d 09/07/15  Yes Richarda OverlieNayana Abrol, MD  SPIRIVA HANDIHALER 18 MCG inhalation capsule Place 18 mcg into inhaler and inhale daily.  08/24/15  Yes Historical Provider, MD  warfarin (COUMADIN) 3 MG tablet Take 3 mg by mouth daily.   Yes Historical Provider, MD   BP 124/64 mmHg  Pulse 115  Temp(Src) 98.3 F (36.8 C) (Oral)  Resp 21  SpO2 95% Physical Exam  Constitutional: She is oriented to person, place, and time. She appears well-developed and well-nourished. No distress.  HENT:  Head: Normocephalic and atraumatic.  Cardiovascular: Regular rhythm, normal heart sounds and intact distal pulses.  Exam reveals no gallop and no friction rub.   No murmur heard. tachy  Pulmonary/Chest: No respiratory distress. She has wheezes. She has rales. She exhibits no tenderness.  Subcostal retractions, increased WOB  Abdominal: Soft. Bowel sounds are normal. She exhibits no distension and no mass. There is no tenderness. There is no rebound and no guarding.  Musculoskeletal: Normal range of motion. She exhibits no tenderness.  NO leg pain.   Lymphadenopathy:    She has no cervical adenopathy.  Neurological: She is alert and oriented to person, place, and time.  Skin: Skin is warm and dry. No rash noted. She is not diaphoretic.  Nursing note and vitals reviewed.   ED Course  Procedures (including critical care  time) Labs Review Labs Reviewed  BASIC METABOLIC PANEL - Abnormal; Notable for the following:    Sodium 133 (*)    Chloride 86 (*)    Glucose, Bld 301 (*)    BUN 34 (*)    Calcium 8.5 (*)    GFR calc non Af Amer 59 (*)    Anion gap 16 (*)    All other components within normal limits  CBC WITH DIFFERENTIAL/PLATELET - Abnormal; Notable for the following:    WBC 20.1 (*)    Hemoglobin 10.8 (*)    MCH 23.1 (*)    MCHC 29.0 (*)    RDW 18.1 (*)    Platelets 426 (*)    Neutro Abs 18.5 (*)    All other components within normal limits  BRAIN NATRIURETIC PEPTIDE - Abnormal; Notable  for the following:    B Natriuretic Peptide 102.1 (*)    All other components within normal limits  I-STAT ARTERIAL BLOOD GAS, ED - Abnormal; Notable for the following:    pH, Arterial 7.483 (*)    pCO2 arterial 52.0 (*)    Bicarbonate 39.0 (*)    Acid-Base Excess 13.0 (*)    All other components within normal limits  PROTIME-INR  Rosezena Sensor, ED    Imaging Review Dg Chest Port 1 View  09/09/2015  CLINICAL DATA:  Shortness of breath today. Patient discharge from the hospital 09/07/2015. EXAM: PORTABLE CHEST 1 VIEW COMPARISON:  CT chest 07/22/2015. Single view of the chest 09/03/2015. FINDINGS: The lungs are emphysematous but clear. Heart size is enlarged. No pneumothorax or pleural effusion. Atherosclerosis noted. IMPRESSION: Emphysema without acute disease. Cardiomegaly. Atherosclerosis. Electronically Signed   By: Drusilla Kanner M.D.   On: 09/09/2015 14:15   I have personally reviewed and evaluated these images and lab results as part of my medical decision-making.   EKG Interpretation None      ED ECG REPORT   Date: 09/09/2015  Rate: 118  Rhythm: sinus tachycardia  QRS Axis: normal  Intervals: normal  ST/T Wave abnormalities: normal  Conduction Disutrbances:  Abnormal R wave progression   Narrative Interpretation: sinus tachy w/multiple premature complexes, borderline low voltage,  abnormal Rwave progression  Old EKG Reviewed: unchanged  I have personally reviewed the EKG tracing and agree with the computerized printout as noted.   MDM   Final diagnoses:  COPD exacerbation (HCC)   74 yo F w/SOB. Please see HPI for details. On exam, moderate amt of discomfort from dyspnea. AF, VSS aside from tachycardia. Lungs w/mild expiratory wheezing, subcostal retractions. Prolonged expiratory phase. No crackles, JVD, or peripheral edema to suspect CHF exacerbation. Likely worsening COPD exacerbation. Labs, CXR, EKG, and bpap.  Despite report of hypoxia, pt 97% on only 2L [place] (normally on 4L [place]). Considered but not c/w PE or ACS. Not c/w CHF.  Pt doing much better on BPAP. Rec'd levaquin and solumedrol. Feel she requires admission as we cannot set up bpap home supplies from the ED and she clearly needs this breathing support. Admit to hospitalist.     Pt was seen under the supervision of Dr. Hyacinth Meeker.     Rachelle Hora, MD 09/09/15 1553  Rachelle Hora, MD 09/09/15 1610  Eber Hong, MD 09/11/15 [phone]

## 2015-09-09 NOTE — ED Notes (Addendum)
Pt here with sob, was discharged on 12/20. Has COPD and CHF, coming from PCP today with hypoxia is already on 4 L oxygen. Pt appears sob, has congested cough. Labored breathing.

## 2015-09-09 NOTE — ED Notes (Signed)
Report attempted 

## 2015-09-09 NOTE — H&P (Signed)
[facility] History and Physical                                                                                    Brenda Wells, is a 74 y.o. female  MRN: [id number]   DOB - 26-Apr-1941  Admit Date - 09/09/2015  Outpatient Primary MD for the patient is [name],[name], MD  Referring MD: Hyacinth MeekerMiller / ER  PMH: Past Medical History  Diagnosis Date  . Acute respiratory failure (HCC)   . Community acquired pneumonia   . COPD with acute exacerbation (HCC)   . Anemia   . Hypokalemia   . Hyponatremia   . CHF (congestive heart failure) (HCC)   . Hypertension   . Shortness of breath dyspnea   . History of kidney stones       PSH: Past Surgical History  Procedure Laterality Date  . Back surgery    . Cyst excision      base of tongue  . Cholecystectomy    . Abdominal hysterectomy       CC:  Chief Complaint  Patient presents with  . Shortness of Breath     HPI: 74 year old female patient with known COPD and chronic atrial fibrillation. Recently just discharged on 12/20 after admission for COPD exacerbation with A. fib and RVR. On date of discharge patient stated she was doing well. She was unable to obtain her prescriptions on date of discharge but did have enough leftover prednisone and was able to accurately dose based on discharge instructions and was taking this. She did not take antibiotic (was planning to pick up at drugstore today). She was actually well this morning when she initially woke up. She got up and ate breakfast, dressed and went back to bed to take a nap. When she awakened from her nap at 10 AM she could not breathe at all. She was very weak and could barely mobilize. She's had the same persistent cough. She utilized her home nebulizers and MDIs without improvement in her symptoms. In addition to the above problems she also is on chronic anticoagulation with Coumadin, she has a history of chronic diastolic heart failure, history of DVT, anemia and GERD. In  addition to the above symptoms today the patient noted that every time she exerted herself she had dictation types sensations in her chest. She reports she has been on Lasix for 3 months.  ER Evaluation and treatment: Portable chest x-ray revealed emphysema without acute disease including no focal infiltrates or edema Initially required BiPAP 30% FiO2 due to increased work of breathing and hypoxia Patient has received DuoNeb nebulizer 2 doses Levaquin 500 mg IV Solu-Medrol 125 mg IV 1 Normal saline bolus 1000 mL 1  Review of Systems   In addition to the HPI above,  No Fever-chills, myalgias or other constitutional symptoms No Headache, changes with Vision or hearing, new weakness, tingling, numbness in any extremity, No problems swallowing food or Liquids, indigestion/reflux No Chest pain, orthopnea No Abdominal pain, N/V; no melena or hematochezia, no dark tarry stools, Bowel movements are regular, No dysuria, hematuria or flank pain No new skin rashes, lesions, masses or bruises, No new joints pains-aches  No recent weight gain or loss No polyuria, polydypsia or polyphagia,  *A full 10 point Review of Systems was done, except as stated above, all other Review of Systems were negative.  Social History Social History  Substance Use Topics  . Smoking status: Current Every Day Smoker -- 60 years    Types: Cigarettes  . Smokeless tobacco: Never Used     Comment: I smoke 1 cigarette a day "  . Alcohol Use: No    Resides at: Private residence  Lives with: Son  Ambulatory status: Without assistive devices   Family History Family History  Problem Relation Age of Onset  . Diabetes Mellitus II Mother   . CAD Mother   . CAD Brother      Prior to Admission medications   Medication Sig Start Date End Date Taking? Authorizing Provider  albuterol (PROVENTIL) (2.5 MG/3ML) 0.083% nebulizer solution Take 3 mLs (2.5 mg total) by nebulization every 2 (two) hours as needed for  wheezing or shortness of breath. 09/07/15  Yes Richarda Overlie, MD  arformoterol (BROVANA) 15 MCG/2ML NEBU Take 2 mLs (15 mcg total) by nebulization 2 (two) times daily. 09/07/15  Yes Richarda Overlie, MD  budesonide (PULMICORT) 0.25 MG/2ML nebulizer solution Take 2 mLs (0.25 mg total) by nebulization 2 (two) times daily. 09/07/15  Yes Richarda Overlie, MD  diltiazem (CARDIZEM CD) 180 MG 24 hr capsule Take 1 capsule (180 mg total) by mouth daily. 09/07/15  Yes Richarda Overlie, MD  furosemide (LASIX) 40 MG tablet Take 40 mg by mouth daily.   Yes Historical Provider, MD  guaiFENesin (MUCINEX) 600 MG 12 hr tablet Take 2 tablets (1,200 mg total) by mouth 2 (two) times daily. 09/07/15  Yes Richarda Overlie, MD  ipratropium-albuterol (DUONEB) 0.5-2.5 (3) MG/3ML SOLN Take 3 mLs by nebulization 4 (four) times daily.   Yes Historical Provider, MD  levofloxacin (LEVAQUIN) 500 MG tablet Take 1 tablet (500 mg total) by mouth daily. 09/07/15  Yes Richarda Overlie, MD  pantoprazole (PROTONIX) 40 MG tablet Take 40 mg by mouth daily.   Yes Historical Provider, MD  predniSONE (DELTASONE) 10 MG tablet 10 tab, 5 days, 9 tab, 5 days, 8 tab, 5 days, 7 tab, 5 d , 6 tab, 5 d , 5 tab, 5 d , 4 tab, 5 days, 3 tab, 5 d , 2 tab, 5 d , 1 tab, 5 d 09/07/15  Yes Richarda Overlie, MD  SPIRIVA HANDIHALER 18 MCG inhalation capsule Place 18 mcg into inhaler and inhale daily.  08/24/15  Yes Historical Provider, MD  warfarin (COUMADIN) 3 MG tablet Take 3 mg by mouth daily.   Yes Historical Provider, MD    No Known Allergies  Physical Exam  Vitals  Blood pressure 130/64, pulse 113, temperature 98.3 F (36.8 C), temperature source Oral, resp. rate 25, SpO2 94 %.   General:  In mild acute distress as evidenced by ongoing respiratory symptoms, appears acutely ill  Psych:  Normal affect, Denies Suicidal or Homicidal ideations, Awake Alert, Oriented X 3. Speech and thought patterns are clear and appropriate, no apparent short term memory deficits  Neuro:    No focal neurological deficits, CN II through XII intact, Strength 5/5 all 4 extremities, Sensation intact all 4 extremities.  ENT:  Ears and Eyes appear Normal, Conjunctivae clear, PER. Moist oral mucosa without erythema or exudates.  Neck:  Supple, No lymphadenopathy appreciated  Respiratory:  Symmetrical chest wall movement, very tight air movement bilaterally without crackles or rhonchi, very faint expiratory  wheezes. Currently BiPAP is off and patient tolerating 4 Wells nasal cannula oxygen  Cardiac:  RRR, No Murmurs, no LE edema noted, no JVD, No carotid bruits, peripheral pulses palpable at 2+  Abdomen:  Positive bowel sounds, Soft, Non tender, Non distended,  No masses appreciated, no obvious hepatosplenomegaly  Skin:  No Cyanosis, Normal Skin Turgor, No Skin Rash or Bruise.  Extremities: Symmetrical without obvious trauma or injury,  no effusions.  Data Review  CBC  Recent Labs Lab 09/04/15 0255 09/05/15 0130 09/06/15 0440 09/07/15 0505 09/09/15 1350  WBC 20.1* 15.1* 13.3* 13.3* 20.1*  HGB 8.5* 8.2* 8.9* 9.3* 10.8*  HCT 28.4* 28.5* 30.6* 30.6* 37.3  PLT 411* 384 365 362 426*  MCV 81.4 81.2 80.5 81.0 79.9  MCH 24.4* 23.4* 23.4* 24.6* 23.1*  MCHC 29.9* 28.8* 29.1* 30.4 29.0*  RDW 18.3* 18.0* 17.8* 17.9* 18.1*  LYMPHSABS  --   --   --   --  0.8  MONOABS  --   --   --   --  0.8  EOSABS  --   --   --   --  0.0  BASOSABS  --   --   --   --  0.0    Chemistries   Recent Labs Lab 09/03/15 1847 09/04/15 0255 09/05/15 0130 09/06/15 0440 09/09/15 1350  NA 135 135 133* 135 133*  K 4.5 4.6 4.6 4.3 5.0  CL 94* 91* 89* 88* 86*  CO2 32 33* 35* 37* 31  GLUCOSE 193* 310* 299* 196* 301*  BUN 23* 22* 24* 29* 34*  CREATININE 1.03* 1.11* 1.10* 0.90 0.94  CALCIUM 8.5* 8.4* 8.1* 8.3* 8.5*  MG 2.0 2.6*  --   --   --   AST 22 21  --  18  --   ALT 22 21  --  22  --   ALKPHOS 60 63  --  52  --   BILITOT 0.3 0.3  --  0.5  --     estimated creatinine clearance is 48 mL/min (by  C-G formula based on Cr of 0.94).  No results for input(s): TSH, T4TOTAL, T3FREE, THYROIDAB in the last 72 hours.  Invalid input(s): FREET3  Coagulation profile  Recent Labs Lab 09/03/15 1847 09/04/15 0255 09/05/15 0130 09/06/15 0440 09/07/15 0505  INR 1.64* 1.71* 1.83* 2.28* 3.05*    No results for input(s): DDIMER in the last 72 hours.  Cardiac Enzymes  Recent Labs Lab 09/04/15 1443 09/04/15 1908 09/05/15 0130  TROPONINI <0.03 <0.03 0.06*    Invalid input(s): POCBNP  Urinalysis    Component Value Date/Time   COLORURINE YELLOW 09/02/2015 0730   APPEARANCEUR CLEAR 09/02/2015 0730   LABSPEC 1.013 09/02/2015 0730   PHURINE 5.5 09/02/2015 0730   GLUCOSEU 100* 09/02/2015 0730   HGBUR NEGATIVE 09/02/2015 0730   BILIRUBINUR NEGATIVE 09/02/2015 0730   KETONESUR NEGATIVE 09/02/2015 0730   PROTEINUR NEGATIVE 09/02/2015 0730   NITRITE NEGATIVE 09/02/2015 0730   LEUKOCYTESUR NEGATIVE 09/02/2015 0730    Imaging results:   Ct Abdomen Pelvis W Contrast  09/03/2015  CLINICAL DATA:  Distended abdomen.  Hematuria. EXAM: CT ABDOMEN AND PELVIS WITH CONTRAST TECHNIQUE: Multidetector CT imaging of the abdomen and pelvis was performed using the standard protocol following bolus administration of intravenous contrast. CONTRAST:  OMNIPAQUE IOHEXOL 300 MG/ML  SOLN COMPARISON:  07/08/2015 FINDINGS: Lower chest: Patchy airspace densities are noted in the lung bases bilaterally. No pleural fluid. Hepatobiliary: Again noted are multiple liver cysts. Previous cholecystectomy.  No biliary dilatation. Pancreas: The pancreas is negative. Spleen: Normal appearance of the spleen. Adrenals/Urinary Tract: The adrenal glands are normal. Indeterminate intermediate attenuating lesion arising from the inferior pole of the left kidney is again noted measuring 2.2 cm, image 38 of series 2. No kidney stones or obstructive uropathy identified. Bilateral renal vascular calcifications are noted. Urinary  bladder is collapsed around a Foley catheter. No bladder calculi noted. Stomach/Bowel: Moderate distension of the stomach. The small bowel loops have a normal course and caliber without obstruction. Moderate stool burden identified throughout the colon. No pathologic dilatation of the large bowel loops identified. Vascular/Lymphatic: Calcified atherosclerotic disease involves the abdominal aorta. The abdominal aorta has a maximum AP dimension of 2.7 cm. No enlarged retroperitoneal or mesenteric adenopathy. No enlarged pelvic or inguinal lymph nodes. Reproductive: Previous hysterectomy.  No adnexal mass noted. Other: No free fluid or fluid collections identified. Musculoskeletal: Degenerative disc disease is noted within the lumbar spine. The bones appear osteopenic. No fractures identified. IMPRESSION: 1. Moderate distension of the gastric lumen. No pathologic dilatation of the small or large bowel loops noted. 2. No findings to explain patient's hematuria. 3. Aortic atherosclerosis. Ectatic abdominal aorta at risk for aneurysm development. Recommend followup by ultrasound in 5 years. This recommendation follows ACR consensus guidelines: White Paper of the ACR Incidental Findings Committee II on Vascular Findings. J Am Coll Radiol 2013; 10:789-794. 4. Indeterminate intermediate attenuating lesion arises from the inferior pole of left kidney. This may represent a hemorrhagic or proteinaceous cyst. A small enhancing renal lesion cannot be excluded. Recommend followup imaging with non-emergent renal protocol MRI or CT. 5. Mild bilateral lower lobe airspace disease. Electronically Signed   By: Signa Kell M.D.   On: 09/03/2015 18:36   Dg Chest Port 1 View  09/09/2015  CLINICAL DATA:  Shortness of breath today. Patient discharge from the hospital 09/07/2015. EXAM: PORTABLE CHEST 1 VIEW COMPARISON:  CT chest 07/22/2015. Single view of the chest 09/03/2015. FINDINGS: The lungs are emphysematous but clear. Heart size  is enlarged. No pneumothorax or pleural effusion. Atherosclerosis noted. IMPRESSION: Emphysema without acute disease. Cardiomegaly. Atherosclerosis. Electronically Signed   By: Drusilla Kanner M.D.   On: 09/09/2015 14:15   Dg Chest Port 1 View  09/03/2015  CLINICAL DATA:  Increasing shortness of breath EXAM: PORTABLE CHEST 1 VIEW COMPARISON:  09/02/2015 FINDINGS: Moderate cardiac enlargement. Aortic atherosclerosis. Mild interstitial edema is identified. No airspace consolidation. IMPRESSION: 1. Cardiac enlargement and mild edema. Electronically Signed   By: Signa Kell M.D.   On: 09/03/2015 19:46   Dg Chest Port 1 View  09/02/2015  CLINICAL DATA:  Shortness of breath today.  History of COPD. EXAM: PORTABLE CHEST 1 VIEW COMPARISON:  CT chest 07/22/2015.  Chest 07/21/2015. FINDINGS: Mild cardiac enlargement. Pulmonary vascularity is normal. Emphysematous changes in the lungs. Atelectasis in the lung bases. Fluid or thickened pleura in the left costophrenic angle. No focal consolidation. No pneumothorax. Mediastinal contours appear intact. Calcified aorta. IMPRESSION: Cardiac enlargement. Emphysematous changes in the lungs. Small pleural effusion or pleural thickening on the left. No focal airspace disease or consolidation in the lungs. Electronically Signed   By: Burman Nieves M.D.   On: 09/02/2015 01:17     EKG: (Independently reviewed) sinus tachycardia with PACs but no ischemic changes ventricular rate 118 bpm and QTC 437 ms   Assessment & Plan  Principal Problem:   Acute on chronic respiratory failure with hypoxia /COPD exacerbation -Anticipate will likely require prn use of BiPAP so  admitted to stepdown -Solu-Medrol IV every 6 hours -With history of atrial fibrillation RVR and current tachycardia we will utilize Xopenex as bronchodilator and continue Brovana and Pulmicort nebs; also continue Spiriva -Flutter valve -Levaquin IV in the event patient has COPD related  tracheobronchitis -Continue Mucinex -Gentle IV fluid hydration therefore hold Lasix-lactic acid 2.76 so we'll need to continue to trend this  Active Problems:   Steroid-induced hyperglycemia -Serum glucose 301 -During last admission hemoglobin A1c somewhat elevated at 7.0 -Check CBGs before meals and our sleep and provide SSI    Chronic atrial fibrillation /Chronic anticoagulation - Coumadin, CHADS2VASC=4 -Currently painting sinus rhythm with mildly tachycardic rate -Continue preadmission Cardizem -INR pending-was mildly elevated at 3.05 on date of discharge    Chronic diastolic (congestive) heart failure  -Listed as chronic diagnosis -ECHO from November 2016 with normal LVEF normal cavity size without apparent LVH or other valvular abnormalities -Patient has been on Lasix for 3 months currently on hold    History of DVT -On Coumadin for atrial fibrillation    Anemia -Hemoglobin stable and at baseline    GERD -Continue PPI    DVT Prophylaxis: Warfarin  Family Communication:  No family at bedside   Code Status:  DO NOT RESUSCITATE  Condition:  Guarded  Discharge disposition: Anticipate discharge back to home environment once medically stable and no evidence of recurrent episodic COPD exacerbation  Time spent in minutes : 60      Brenda Wells. ANP on 09/09/2015 at 5:35 PM  You may contact me by going to [email or web address] - password [email or web address]  I am available from 7a-7p but please confirm I am on the schedule by going to Amion as above.   After 7p please contact night coverage person covering me after hours  [facility]

## 2015-09-09 NOTE — ED Notes (Signed)
Brenda Wells, Brenda Wells [phone], please call with update.

## 2015-09-09 NOTE — ED Notes (Signed)
Advised respiratory pt c/o pain at bridge of her nose from bi-pap

## 2015-09-09 NOTE — Progress Notes (Signed)
Pt placed on NIV per MD order, pt tolerating well, RT will monitor

## 2015-09-09 NOTE — ED Provider Notes (Signed)
The patient is a 74 year old female, she has a history of severe COPD on home oxygen at 4 L continuously. This has had to be bumped up to 6 L recently, she has increased dyspnea shortness of breath wheezing and coughing. Family members report that she is unable to do anything without becoming severely dyspneic. On exam the patient has diffuse wheezing in all lung fields. She does appear critically ill with diffuse respiratory distress. There is no peripheral edema, she will need to be admitted to the hospital most likely. We'll start BiPAP.  I saw and evaluated the patient, reviewed the resident's note and I agree with the findings and plan.   EKG Interpretation  Date/Time:  Thursday September 09 2015 14:15:21 EST Ventricular Rate:  118 PR Interval:  121 QRS Duration: 86 QT Interval:  312 QTC Calculation: 437 R Axis:   70 Text Interpretation:  Sinus tachycardia Multiple premature complexes, vent & supraven Aberrant conduction of SV complex(es) Borderline low voltage, extremity leads Abnormal R-wave progression, early transition ED PHYSICIAN INTERPRETATION AVAILABLE IN [facility] Confirmed by TEST, Record ([id number]) on 09/10/2015 6:49:14 AM        Final diagnoses:  COPD exacerbation (HCC)      Eber HongBrian Rankin Coolman, MD 09/11/15 1530

## 2015-09-10 LAB — CBC
HCT: 31.1 % — ABNORMAL LOW (ref 36.0–46.0)
HEMOGLOBIN: 9.1 g/dL — AB (ref 12.0–15.0)
MCH: 23.3 pg — ABNORMAL LOW (ref 26.0–34.0)
MCHC: 29.3 g/dL — ABNORMAL LOW (ref 30.0–36.0)
MCV: 79.5 fL (ref 78.0–100.0)
PLATELETS: 318 10*3/uL (ref 150–400)
RBC: 3.91 MIL/uL (ref 3.87–5.11)
RDW: 18.1 % — ABNORMAL HIGH (ref 11.5–15.5)
WBC: 8.5 10*3/uL (ref 4.0–10.5)

## 2015-09-10 LAB — GLUCOSE, CAPILLARY
GLUCOSE-CAPILLARY: 238 mg/dL — AB (ref 65–99)
GLUCOSE-CAPILLARY: 233 mg/dL — AB (ref 65–99)
Glucose-Capillary: 291 mg/dL — ABNORMAL HIGH (ref 65–99)

## 2015-09-10 LAB — COMPREHENSIVE METABOLIC PANEL
ALT: 20 U/L (ref 14–54)
AST: 17 U/L (ref 15–41)
Albumin: 2.8 g/dL — ABNORMAL LOW (ref 3.5–5.0)
Alkaline Phosphatase: 54 U/L (ref 38–126)
Anion gap: 12 (ref 5–15)
BUN: 29 mg/dL — ABNORMAL HIGH (ref 6–20)
CHLORIDE: 91 mmol/L — AB (ref 101–111)
CO2: 33 mmol/L — ABNORMAL HIGH (ref 22–32)
CREATININE: 0.93 mg/dL (ref 0.44–1.00)
Calcium: 8.1 mg/dL — ABNORMAL LOW (ref 8.9–10.3)
GFR, EST NON AFRICAN AMERICAN: 60 mL/min — AB (ref >60)
Glucose, Bld: 202 mg/dL — ABNORMAL HIGH (ref 65–99)
POTASSIUM: 4.7 mmol/L (ref 3.5–5.1)
Sodium: 136 mmol/L (ref 135–145)
TOTAL PROTEIN: 5.3 g/dL — AB (ref 6.5–8.1)
Total Bilirubin: 0.5 mg/dL (ref 0.3–1.2)

## 2015-09-10 LAB — PROTIME-INR
INR: 1.38 (ref 0.00–1.49)
PROTHROMBIN TIME: 17.1 s — AB (ref 11.6–15.2)

## 2015-09-10 MED ORDER — METHYLPREDNISOLONE SODIUM SUCC 125 MG IJ SOLR
60.0000 mg | Freq: Two times a day (BID) | INTRAMUSCULAR | Status: AC
  Administered 2015-09-10 – 2015-09-11 (×3): 60 mg via INTRAVENOUS
  Filled 2015-09-10 (×3): qty 2

## 2015-09-10 MED ORDER — IPRATROPIUM BROMIDE 0.02 % IN SOLN
0.5000 mg | Freq: Four times a day (QID) | RESPIRATORY_TRACT | Status: DC
  Administered 2015-09-10 – 2015-09-12 (×7): 0.5 mg via RESPIRATORY_TRACT
  Filled 2015-09-10 (×7): qty 2.5

## 2015-09-10 MED ORDER — WARFARIN SODIUM 5 MG PO TABS
5.0000 mg | ORAL_TABLET | Freq: Once | ORAL | Status: AC
  Administered 2015-09-10: 5 mg via ORAL
  Filled 2015-09-10: qty 1

## 2015-09-10 MED ORDER — LEVALBUTEROL HCL 0.63 MG/3ML IN NEBU
0.6300 mg | INHALATION_SOLUTION | Freq: Four times a day (QID) | RESPIRATORY_TRACT | Status: DC
  Administered 2015-09-10 – 2015-09-12 (×8): 0.63 mg via RESPIRATORY_TRACT
  Filled 2015-09-10 (×8): qty 3

## 2015-09-10 NOTE — Progress Notes (Signed)
ANTICOAGULATION CONSULT NOTE - Follow Up Consult  Pharmacy Consult for Coumadin Indication: atrial fibrillation and History of DVT  No Known Allergies Patient Measurements: Height: 5\' 5"  (165.1 cm) Weight: 133 lb 13.1 oz (60.7 kg) IBW/kg (Calculated) : 57 Vital Signs: Temp: 97.7 F (36.5 C) (12/23 0800) Temp Source: Axillary (12/23 0800) BP: 82/70 mmHg (12/23 0925) Pulse Rate: 85 (12/23 0800) Labs:  Recent Labs  09/09/15 1350 09/10/15 0345  HGB 10.8* 9.1*  HCT 37.3 31.1*  PLT 426* 318  LABPROT 18.7* 17.1*  INR 1.56* 1.38  CREATININE 0.94 0.93   Estimated Creatinine Clearance: 48.5 mL/min (by C-G formula based on Cr of 0.93).  Assessment: 74 year old female on chronic Coumadin for Afib and history of DVT. Recent discharge on 12/20 after COPD exacerbation in which she was discharged on Levaquin and Coumadin 3mg  daily. INR at time of discharge was 3.05- dose continued per patient. Readmitted yesterday (12/22) and  INR had dropped down to 1.56. Hgb/Plts stable. DDI- LVQ.   INR today 1.38. Note in reviewing records from last admission, patient had required larger doses of 5mg  during stay and likely needed higher daily dose than 3mg  daily.   Goal of Therapy:  INR 2-3 Monitor platelets by anticoagulation protocol: Yes   Plan:  Coumadin 5mg  po x1 today.  Daily PT/INR.  Monitor closely on Levaquin IV therapy.   Link SnufferJessica Salvador [place]bee, PharmD, BCPS Clinical Pharmacist [phone] 09/10/2015,9:32 AM

## 2015-09-10 NOTE — Evaluation (Signed)
Physical Therapy Evaluation Patient Details Name: Brenda Wells MRN: [id number] DOB: 1941/03/25 Today's Date: 09/10/2015   History of Present Illness  74 year old female with COPD with chronic hypoxic respiratory failure on 4 L O2 via nasal cannula, chronic A. fib on Coumadin, chronic diastolic CHF and history of DVT who was recently discharged from the hospital on 12/20 after being admitted for acute COPD exacerbation with respiratory failure was doing well at home and went to see her PCP this afternoon when she had worsened breathing and cough--readmitted for  Acute on chronic respiratory failure with hypoxia /COPD exacerbation  Clinical Impression  Pt admitted with above diagnosis. Pt currently with functional limitations due to the deficits listed below (see PT Problem List). Pt was able to ambulate with fairly steady gait with RW needing min to min guard assist for stability.  Pt states she will have 24 hour care and that she likes the nurse that comes but she does not want HHPT because they "push her too hard and I know what to do."  Is agreeable to HHNurse.  Has equipment.  Will follow acutely.   Pt will benefit from skilled PT to increase their independence and safety with mobility to allow discharge to the venue listed below.      Follow Up Recommendations Supervision/Assistance - 24 hour;No PT follow up (declines PT f/u, states "I know [facility] to do." [facility]    Equipment Recommendations    None   Recommendations for Other Services       Precautions / Restrictions Precautions Precautions: Fall Precaution Comments: watch HR and sats Restrictions Weight Bearing Restrictions: No      Mobility  Bed Mobility               General bed mobility comments: sitting on EOB on arrival eating breakfast  Transfers Overall transfer level: Needs assistance Equipment used: Rolling walker (2 wheeled) Transfers: Sit to/from Stand Sit to Stand: Min guard;Min assist          General transfer comment: pt able to power up without assist but needed steadying assist once up as pt somewhat shaky.      Ambulation/Gait Ambulation/Gait assistance: Min assist;Min guard;+2 safety/equipment Ambulation Distance (Feet): 40 Feet Assistive device: Rolling walker (2 wheeled) Gait Pattern/deviations: Step-through pattern;Decreased stride length;Leaning posteriorly Gait velocity: decreased   General Gait Details: Pt was able to ambulate with RW and less shaky as she ambulated further.  Sats desat with activity.    Stairs            Wheelchair Mobility    Modified Rankin (Stroke Patients Only)       Balance                                             Pertinent Vitals/Pain Pain Assessment: No/denies pain  BP 118/50 initiially and then 126/65 once up.  BP 71/42 once in chair and pt asymptomatic.  Nursing aware and was going to recheck.  O2 on 4LO2 at rest 90-94%.  O2 on 4LO2 with ambulation was 85-90%.  Recovered to 94% on 4L once back in room in chair within 1 minute of rest.      Home Living Family/patient expects to be discharged to:: Private residence Living Arrangements: Children Available Help at Discharge: Family;Available 24 hours/day (son and girlfriend) Type of Home: House Home Access: Stairs to enter  Entrance Stairs-Rails: None Entrance Stairs-Number of Steps: 5 Home Layout: One level Home Equipment: Walker - 2 wheels;Shower seat;Bedside commode (O2)      Prior Function Level of Independence: Needs assistance   Gait / Transfers Assistance Needed: min guard assist at times with transfers  ADL's / Homemaking Assistance Needed: sponge bathes, toilets and dresses herself with occassional A prn from family; assist for meal prep and housekeeping        Hand Dominance   Dominant Hand: Right    Extremity/Trunk Assessment   Upper Extremity Assessment: Defer to OT evaluation           Lower Extremity Assessment:  Generalized weakness         Communication   Communication: No difficulties  Cognition Arousal/Alertness: Awake/alert Behavior During Therapy: WFL for tasks assessed/performed Overall Cognitive Status: Within Functional Limits for tasks assessed                      General Comments      Exercises Other Exercises Other Exercises: LAQ sitting in recliner x 10 reps each      Assessment/Plan    PT Assessment Patient needs continued PT services  PT Diagnosis Difficulty walking;Other (comment) (decrease activity tolerance)   PT Problem List Decreased strength;Decreased activity tolerance;Decreased mobility;Cardiopulmonary status limiting activity  PT Treatment Interventions Gait training;Functional mobility training;Therapeutic activities;DME instruction;Patient/family education;Stair training   PT Goals (Current goals can be found in the Care Plan section) Acute Rehab PT Goals Patient Stated Goal: to get better and not have to come back  PT Goal Formulation: With patient Time For Goal Achievement: 09/17/15 Potential to Achieve Goals: Good    Frequency Min 3X/week   Barriers to discharge        Co-evaluation               End of Session Equipment Utilized During Treatment: Gait belt;Oxygen Activity Tolerance: Patient tolerated treatment well Patient left: in chair;with call bell/phone within reach Nurse Communication: Mobility status         Time: 7829-5621 PT Time Calculation (min) (ACUTE ONLY): 24 min   Charges:   PT Evaluation $Initial PT Evaluation Tier I: 1 Procedure PT Treatments $Gait Training: 8-22 mins   PT G CodesTawni Millers F 09-20-15, 10:34 AM  Eber Jones Acute Rehabilitation [phone] (pager)

## 2015-09-10 NOTE — Evaluation (Signed)
Occupational Therapy Evaluation Patient Details Name: Brenda Wells MRN: [id number] DOB: 1940-10-01 Today's Date: 09/10/2015    History of Present Illness 74 year old female with COPD with chronic hypoxic respiratory failure on 4 L O2 via nasal cannula, chronic A. fib on Coumadin, chronic diastolic CHF and history of DVT who was recently discharged from the hospital on 12/20 after being admitted for acute COPD exacerbation with respiratory failure was doing well at home and went to see her PCP this afternoon when she had worsened breathing and cough--readmitted for  Acute on chronic respiratory failure with hypoxia /COPD exacerbation   Clinical Impression   This 74 yo female admitted with above presents to acute OT with decreased balance and increased WOB/DOE both affecting her safety/independence with basic ADLs. She will benefit from acute OT with follow up HHOT to get back to PLOF.    Follow Up Recommendations  Home health OT;Supervision/Assistance - 24 hour    Equipment Recommendations  None recommended by OT       Precautions / Restrictions Precautions Precautions: Fall Precaution Comments: watch HR and sats Restrictions Weight Bearing Restrictions: No      Mobility Bed Mobility               General bed mobility comments: Pt up in recliner upon my arrival  Transfers Overall transfer level: Needs assistance Equipment used: Rolling walker (2 wheeled) Transfers: Sit to/from Stand Sit to Stand: Min guard              Balance  Pt with reliance on RW for support in standing and ambulation.                                          ADL Overall ADL's : Needs assistance/impaired Eating/Feeding: Independent;Sitting   Grooming: Set up;Sitting   Upper Body Bathing: Set up;Sitting   Lower Body Bathing: Min guard;Sit to/from stand   Upper Body Dressing : Set up;Sitting   Lower Body Dressing: Min guard;Sit to/from stand   Toilet Transfer:  Min guard;Ambulation;RW;Comfort height toilet;Grab bars   Toileting- Clothing Manipulation and Hygiene: Min guard;Sit to/from stand         General ADL Comments: Pt aware of purse lipped breathing without education and using this technique during session               Pertinent Vitals/Pain Pain Assessment: No/denies pain     Hand Dominance Right   Extremity/Trunk Assessment Upper Extremity Assessment Upper Extremity Assessment: Generalized weakness           Communication Communication Communication: No difficulties   Cognition Arousal/Alertness: Awake/alert Behavior During Therapy: WFL for tasks assessed/performed Overall Cognitive Status: Within Functional Limits for tasks assessed                                Home Living Family/patient expects to be discharged to:: Private residence Living Arrangements: Children Available Help at Discharge: Family;Available 24 hours/day Type of Home: House Home Access: Level entry     Home Layout: One level     Bathroom Shower/Tub: Walk-in shower;Tub/shower unit (pt sponge baths)   Bathroom Toilet: Standard     Home Equipment: Walker - 2 wheels;Shower seat;Bedside commode (O2)          Prior Functioning/Environment Level of Independence: Needs assistance  Gait / Transfers Assistance Needed:  min guard assist at times with transfers ADL's / Homemaking Assistance Needed: sponge bathes, toilets and dresses herself with occassional A prn from family; assist for meal prep and housekeeping        OT Diagnosis: Generalized weakness   OT Problem List: Decreased strength;Cardiopulmonary status limiting activity;Impaired balance (sitting and/or standing)   OT Treatment/Interventions: Self-care/ADL training;Patient/family education;Balance training;Energy conservation;Therapeutic activities    OT Goals(Current goals can be found in the care plan section) Acute Rehab OT Goals Patient Stated Goal: to get  better and not have to come back  OT Goal Formulation: With patient Time For Goal Achievement: 09/24/15 Potential to Achieve Goals: Good  OT Frequency: Min 2X/week              End of Session Equipment Utilized During Treatment: Rolling walker;Oxygen (5 liters for ambulation to bathroom and back--returned to 4 liters at end of session) Nurse Communication: Mobility status (NT--also made NT aware pt had urinated)  Activity Tolerance:  (limited by DOE/increased WOB) Patient left: in chair;with call bell/phone within reach   Time: 0930-1002 OT Time Calculation (min): 32 min Charges:  OT General Charges $OT Visit: 1 Procedure OT Evaluation $Initial OT Evaluation Tier I: 1 Procedure OT Treatments $Self Care/Home Management : 8-22 mins  Evette Georges [phone] 09/10/2015, 10:11 AM

## 2015-09-10 NOTE — Progress Notes (Signed)
Pt is resting comfortably on nasal cannula at this time, all vital signs within normal range. Pt may wear BiPAP later in the evening if she feels she needs it

## 2015-09-10 NOTE — Progress Notes (Signed)
Inpatient Diabetes Program Recommendations  AACE/ADA: New Consensus Statement on Inpatient Glycemic Control (2015)  Target Ranges:  Prepandial:   less than 140 mg/dL      Peak postprandial:   less than 180 mg/dL (1-2 hours)      Critically ill patients:  140 - 180 mg/dL  Results for Brenda Wells, Brenda Wells (MRN [id number]) as of 09/10/2015 08:21  Ref. Range 09/09/2015 20:28 09/09/2015 22:30  Glucose-Capillary Latest Ref Range: 65-99 mg/dL 409248 (H) 811238 (H)   Review of Glycemic Control   Current orders for Inpatient glycemic control: Novolog 0-15 units TID with meals, Novolog 0-5 units HS  Inpatient Diabetes Program Recommendations: Insulin - Basal: Noted hyperglycemia likely due to steroids. Recommend starting low dose basal insulin if steroids will be continued. Please consider starting with Levemir 6 units daily starting now (based on 60 kg x 0.1 units).  Thanks, Orlando PennerMarie Shalin Linders, RN, MSN, CDE Diabetes Coordinator Inpatient Diabetes Program [phone] (Team Pager from 8am to 5pm) [phone] ([facility] office) [phone] [facility] office) [phone] [facility] office)

## 2015-09-10 NOTE — Progress Notes (Signed)
Utilization review completed. Garry Bochicchio, RN, BSN. 

## 2015-09-10 NOTE — Progress Notes (Addendum)
[place] TEAM 1 - Stepdown/ICU TEAM PROGRESS NOTE  Harlow Asadna G Sollers ZOX:[id number] DOB: 11-13-1940 DOA: 09/09/2015 PCP: [name],[name], MD  Admit HPI / Brief Narrative: 74 year old female with COPD with chronic hypoxic respiratory failure on 4 L O2 via nasal cannula, chronic A. fib on Coumadin, chronic diastolic CHF, and history of DVT who was discharged from the hospital on 12/20 after being admitted for acute COPD exacerbation with respiratory failure and was doing well at home and went to see her PCP this afternoon when she had acute worsened breathing and cough, and presented to the ED.    Patient was tachycardic in the ED with ABG showing hypercapnic respiratory failure. Required BiPAP and was given IV Solu-Medrol, nebs and Levaquin. Admitted to stepdown unit  HPI/Subjective: Pt feels she is slowly improving.  No cp, n/v, or abdom pain.  No swelling in legs.    Assessment/Plan:  Acute on chronic hypoxic and hypercapnic respiratory failure secondary to COPD exacerbation Slowly improving - cont usual tx and follow clinically   Chronic diastolic CHF No exam findings to suggest signif volume overload - she is at her apparent baseline wgt of ~60kg   Chronic Atrial fib on Coumadin HR controlled - cont warfarin per Rx  DVT Remains on warfarin   Hyperglycemia A1c 12/17 actually 7.0 = DM - cont SSI - educate pt    MRSA screen +  Code Status: DNR Family Communication: no family present at time of exam Disposition Plan: PT suggests 24hr supervision - OT suggests HHOT as well   Consultants: none  Procedures: none  Antibiotics: levaquin 12/22 >  DVT prophylaxis: warfarin   Objective: Blood pressure 82/70, pulse 85, temperature 97.7 F (36.5 C), temperature source Axillary, resp. rate 24, height 5\' 5"  (1.651 m), weight 60.7 kg (133 lb 13.1 oz), SpO2 97 %.  Intake/Output Summary (Last 24 hours) at 09/10/15 1129 Last data filed at 09/10/15 0913  Gross per 24 hour    Intake 598.84 ml  Output    725 ml  Net -126.16 ml   Exam: General: mild wheezing - no acute distress Lungs: very poor air movement th/o - mild exp wheeze - no focal crackles  Cardiovascular: very distant HS - regular rate - no M Abdomen: Nontender, nondistended, soft, bowel sounds positive, no rebound, no ascites, no appreciable mass Extremities: No significant cyanosis, clubbing, or edema bilateral lower extremities  Data Reviewed: Basic Metabolic Panel:  Recent Labs Lab 09/03/15 1847 09/04/15 0255 09/05/15 0130 09/06/15 0440 09/09/15 1350 09/10/15 0345  NA 135 135 133* 135 133* 136  K 4.5 4.6 4.6 4.3 5.0 4.7  CL 94* 91* 89* 88* 86* 91*  CO2 32 33* 35* 37* 31 33*  GLUCOSE 193* 310* 299* 196* 301* 202*  BUN 23* 22* 24* 29* 34* 29*  CREATININE 1.03* 1.11* 1.10* 0.90 0.94 0.93  CALCIUM 8.5* 8.4* 8.1* 8.3* 8.5* 8.1*  MG 2.0 2.6*  --   --   --   --     CBC:  Recent Labs Lab 09/05/15 0130 09/06/15 0440 09/07/15 0505 09/09/15 1350 09/10/15 0345  WBC 15.1* 13.3* 13.3* 20.1* 8.5  NEUTROABS  --   --   --  18.5*  --   HGB 8.2* 8.9* 9.3* 10.8* 9.1*  HCT 28.5* 30.6* 30.6* 37.3 31.1*  MCV 81.2 80.5 81.0 79.9 79.5  PLT 384 365 362 426* 318    Liver Function Tests:  Recent Labs Lab 09/03/15 1847 09/04/15 0255 09/06/15 0440 09/10/15 0345  AST 22  ALT ALKPHOS 60 63 52 54  BILITOT 0.3 0.3 0.5 0.5  PROT 6.1* 5.4* 5.2* 5.3*  ALBUMIN 2.9* 2.5* 2.6* 2.8*    Recent Labs Lab 09/05/15 1430  LIPASE 51   Coags:  Recent Labs Lab 09/05/15 0130 09/06/15 0440 09/07/15 0505 09/09/15 1350 09/10/15 0345  INR 1.83* 2.28* 3.05* 1.56* 1.38   Cardiac Enzymes:  Recent Labs Lab 09/03/15 1847 09/04/15 1443 09/04/15 1908 09/05/15 0130  TROPONINI 0.06* <0.03 <0.03 0.06*    CBG:  Recent Labs Lab 09/07/15 0738 09/07/15 1127 09/09/15 2028 09/09/15 2230 09/10/15 0819  GLUCAP 206* 159* 248* 238* 238*    Recent Results (from the past  240 hour(s))  Culture, blood (routine x 2) Call MD if unable to obtain prior to antibiotics being given     Status: None   Collection Time: 09/02/15  3:28 AM  Result Value Ref Range Status   Specimen Description BLOOD RIGHT ARM  Final   Special Requests BOTTLES DRAWN AEROBIC AND ANAEROBIC  Final   Culture NO GROWTH 5 DAYS  Final   Report Status 09/07/2015 FINAL  Final  Culture, blood (routine x 2) Call MD if unable to obtain prior to antibiotics being given     Status: None   Collection Time: 09/02/15  3:30 AM  Result Value Ref Range Status   Specimen Description BLOOD LEFT ARM  Final   Special Requests BOTTLES DRAWN AEROBIC AND ANAEROBIC  Final   Culture NO GROWTH 5 DAYS  Final   Report Status 09/07/2015 FINAL  Final  MRSA PCR Screening     Status: Abnormal   Collection Time: 09/02/15  1:16 PM  Result Value Ref Range Status   MRSA by PCR POSITIVE (A) NEGATIVE Final    Comment:        The GeneXpert MRSA Assay (FDA approved for NASAL specimens only), is one component of a comprehensive MRSA colonization surveillance program. It is not intended to diagnose MRSA infection nor to guide or monitor treatment for MRSA infections. RESULT CALLED TO, READ BACK BY AND VERIFIED WITH: J. LAUER RN 15:45 09/02/15 ([email or web address])      Studies:   Recent x-ray studies have been reviewed in detail by the Attending Physician  Scheduled Meds:  Scheduled Meds: . arformoterol  15 mcg Nebulization BID  . budesonide  0.25 mg Nebulization BID  . diltiazem  180 mg Oral Daily  . docusate sodium  100 mg Oral BID  . guaiFENesin  1,200 mg Oral BID  . insulin aspart  0-15 Units Subcutaneous TID WC  . insulin aspart  0-5 Units Subcutaneous QHS  . levalbuterol  0.63 mg Nebulization Q6H  . levofloxacin (LEVAQUIN) IV  500 mg Intravenous Q24H  . methylPREDNISolone (SOLU-MEDROL) injection  60 mg Intravenous Q6H  . pantoprazole  40 mg Oral Daily  . sodium chloride  3 mL Intravenous Q12H  . tiotropium   18 mcg Inhalation Daily  . warfarin  5 mg Oral ONCE-1800  . Warfarin - Pharmacist Dosing Inpatient   Does not apply q1800    Time spent on care of this patient: 35 mins   [facility] T , MD   [facility] Office  [phone] Pager - Text Page per Amion as per below:  On-Call/Text Page:      [email or web address]      password [email or web address]  If 7PM-7AM, please contact night-coverage [email or web address] Password [email or web address] 09/10/2015, 11:29 AM   LOS: 1 day

## 2015-09-10 NOTE — Progress Notes (Signed)
CCMD notified of two 3 beat runs of Vtach. Pt asymptomatic and active, moving to the edge of bed during calls from CCMD. Paged MD to make aware. No new orders at this time.

## 2015-09-11 LAB — COMPREHENSIVE METABOLIC PANEL
ALBUMIN: 2.5 g/dL — AB (ref 3.5–5.0)
ALT: 17 U/L (ref 14–54)
ANION GAP: 7 (ref 5–15)
AST: 16 U/L (ref 15–41)
Alkaline Phosphatase: 45 U/L (ref 38–126)
BUN: 24 mg/dL — ABNORMAL HIGH (ref 6–20)
CO2: 33 mmol/L — AB (ref 22–32)
Calcium: 8.2 mg/dL — ABNORMAL LOW (ref 8.9–10.3)
Chloride: 98 mmol/L — ABNORMAL LOW (ref 101–111)
Creatinine, Ser: 0.82 mg/dL (ref 0.44–1.00)
GFR calc Af Amer: >60 mL/min (ref >60)
GFR calc non Af Amer: >60 mL/min (ref >60)
GLUCOSE: 219 mg/dL — AB (ref 65–99)
POTASSIUM: 4.3 mmol/L (ref 3.5–5.1)
SODIUM: 138 mmol/L (ref 135–145)
Total Bilirubin: 0.2 mg/dL — ABNORMAL LOW (ref 0.3–1.2)
Total Protein: 4.9 g/dL — ABNORMAL LOW (ref 6.5–8.1)

## 2015-09-11 LAB — CBC
HCT: 28.8 % — ABNORMAL LOW (ref 36.0–46.0)
HEMOGLOBIN: 8.7 g/dL — AB (ref 12.0–15.0)
MCH: 23.8 pg — ABNORMAL LOW (ref 26.0–34.0)
MCHC: 30.2 g/dL (ref 30.0–36.0)
MCV: 78.9 fL (ref 78.0–100.0)
Platelets: 316 10*3/uL (ref 150–400)
RBC: 3.65 MIL/uL — ABNORMAL LOW (ref 3.87–5.11)
RDW: 18 % — AB (ref 11.5–15.5)
WBC: 13.8 10*3/uL — AB (ref 4.0–10.5)

## 2015-09-11 LAB — GLUCOSE, CAPILLARY
GLUCOSE-CAPILLARY: 216 mg/dL — AB (ref 65–99)
GLUCOSE-CAPILLARY: 167 mg/dL — AB (ref 65–99)
Glucose-Capillary: 197 mg/dL — ABNORMAL HIGH (ref 65–99)
Glucose-Capillary: 284 mg/dL — ABNORMAL HIGH (ref 65–99)

## 2015-09-11 LAB — PROTIME-INR
INR: 1.98 — AB (ref 0.00–1.49)
PROTHROMBIN TIME: 22.4 s — AB (ref 11.6–15.2)

## 2015-09-11 MED ORDER — WARFARIN SODIUM 4 MG PO TABS
4.0000 mg | ORAL_TABLET | Freq: Once | ORAL | Status: DC
  Filled 2015-09-11: qty 1

## 2015-09-11 MED ORDER — PREDNISONE 20 MG PO TABS
40.0000 mg | ORAL_TABLET | Freq: Two times a day (BID) | ORAL | Status: DC
  Filled 2015-09-11 (×2): qty 2

## 2015-09-11 MED ORDER — CETYLPYRIDINIUM CHLORIDE 0.05 % MT LIQD
7.0000 mL | Freq: Two times a day (BID) | OROMUCOSAL | Status: DC
  Administered 2015-09-11 – 2015-09-14 (×7): 7 mL via OROMUCOSAL

## 2015-09-11 MED ORDER — DILTIAZEM HCL ER COATED BEADS 240 MG PO CP24
240.0000 mg | ORAL_CAPSULE | Freq: Every day | ORAL | Status: DC
  Administered 2015-09-12 – 2015-09-14 (×3): 240 mg via ORAL
  Filled 2015-09-11 (×3): qty 1

## 2015-09-11 NOTE — Progress Notes (Signed)
Patient is resting comfortably on 3L nasal cannula with no apparent signs of respiratory distress. BiPAP not placed on at this time and RT will continue to monitor throughout the night.

## 2015-09-11 NOTE — Progress Notes (Signed)
Report called  

## 2015-09-11 NOTE — Progress Notes (Signed)
[place] TEAM 1 - Stepdown/ICU TEAM PROGRESS NOTE  Brenda Wells WJX:[id number] DOB: 30-Jan-1941 DOA: 09/09/2015 PCP: [name], MD  Admit HPI / Brief Narrative: 74 year old female with COPD with chronic hypoxic respiratory failure on 4 L O2 via nasal cannula, chronic A. fib on Coumadin, chronic diastolic CHF, and history of DVT who was discharged from the hospital on 12/20 after being admitted for acute COPD exacerbation with respiratory failure and was initially doing well at home until 12/22 when she had acute worsening of her breathing and cough, and presented backt to the ED.    Patient was tachycardic in the ED with ABG showing hypercapnic respiratory failure. Required BiPAP and was given IV Solu-Medrol, nebs and Levaquin. Admitted to stepdown unit  HPI/Subjective: The pt is resting comfortably in bed at the moment.  She denies signif sob at rest, but has not yet attempted to ambulate to any extent.  She denies cp, n/v, abdom pain, or HA.    Assessment/Plan:  Acute on chronic hypoxic and hypercapnic respiratory failure secondary to COPD exacerbation Slowly improving - cont usual medical tx - begin very slow/prolonged steroid taper - will need to be able to ambulate w/o signif desaturation or exhaustion prior to d/c home - will be very high risk for re-admit given severity of her lung disease   Chronic diastolic CHF No exam findings to suggest signif volume overload - she is at her apparent baseline wgt of ~60kg   Chronic Atrial fib on Coumadin HR controlled, but w/ some episodes of borderline tachy - adjust med tx - cont warfarin per Rx - follow HR w/ exertion   Hx of DVT Remains on warfarin   Hyperglycemia A1c 12/17 actually 7.0 = DM - cont SSI - educate pt - hopefully she will only need diet modification once she is off steroids   MRSA screen +  Code Status: DNR Family Communication: no family present at time of exam Disposition Plan: PT suggests 24hr supervision -  OT suggests HHOT as well - transfer to tele bed - continue to ambulate and assess dyspnea on exertion - ?d/c in 48-72hrs   Consultants: none  Procedures: none  Antibiotics: levaquin 12/22 > 12/24  DVT prophylaxis: warfarin   Objective: Blood pressure 122/69, pulse 98, temperature 98.3 F (36.8 C), temperature source Oral, resp. rate 28, height  (1.651 m), weight 60.2 kg (132 lb 11.5 oz), SpO2 100 %.  Intake/Output Summary (Last 24 hours) at 09/11/15 1533 Last data filed at 09/11/15 1400  Gross per 24 hour  Intake   1770 ml  Output    750 ml  Net   1020 ml   Exam: General: no wheezing on exam today  Lungs: very poor air movement th/o - no focal crackles  Cardiovascular: very distant HS - regular rate - no M Abdomen: nontender, nondistended, soft, bowel sounds positive Extremities: no significant cyanosis, clubbing, or edema bilateral lower extremities  Data Reviewed: Basic Metabolic Panel:  Recent Labs Lab 09/05/15 0130 09/06/15 0440 09/09/15 1350 09/10/15 0345 09/11/15 0242  NA 133* 135 133* 136 138  K 4.6 4.3 5.0 4.7 4.3  CL 89* 88* 86* 91* 98*  CO2 35* 37* 31 33* 33*  GLUCOSE 299* 196* 301* 202* 219*  BUN 24* 29* 34* 29* 24*  CREATININE 1.10* 0.90 0.94 0.93 0.82  CALCIUM 8.1* 8.3* 8.5* 8.1* 8.2*    CBC:  Recent Labs Lab 09/06/15 0440 09/07/15 0505 09/09/15 1350 09/10/15 0345 09/11/15 0242  WBC 13.3* 13.3*  20.1* 8.5 13.8*  NEUTROABS  --   --  18.5*  --   --   HGB 8.9* 9.3* 10.8* 9.1* 8.7*  HCT 30.6* 30.6* 37.3 31.1* 28.8*  MCV 80.5 81.0 79.9 79.5 78.9  PLT 365 362 426* 318 316    Liver Function Tests:  Recent Labs Lab 09/06/15 0440 09/10/15 0345 09/11/15 0242  AST ALT ALKPHOS 52 54 45  BILITOT 0.5 0.5 0.2*  PROT 5.2* 5.3* 4.9*  ALBUMIN 2.6* 2.8* 2.5*    Recent Labs Lab 09/05/15 1430  LIPASE 51   Coags:  Recent Labs Lab 09/06/15 0440 09/07/15 0505 09/09/15 1350 09/10/15 0345 09/11/15 0242  INR  2.28* 3.05* 1.56* 1.38 1.98*   Cardiac Enzymes:  Recent Labs Lab 09/04/15 1908 09/05/15 0130  TROPONINI <0.03 0.06*    CBG:  Recent Labs Lab 09/10/15 1249 09/10/15 1705 09/11/15 0004 09/11/15 0814 09/11/15 1304  GLUCAP 233* 291* 197* 216* 167*    Recent Results (from the past 240 hour(s))  Culture, blood (routine x 2) Call MD if unable to obtain prior to antibiotics being given     Status: None   Collection Time: 09/02/15  3:28 AM  Result Value Ref Range Status   Specimen Description BLOOD RIGHT ARM  Final   Special Requests BOTTLES DRAWN AEROBIC AND ANAEROBIC  Final   Culture NO GROWTH 5 DAYS  Final   Report Status 09/07/2015 FINAL  Final  Culture, blood (routine x 2) Call MD if unable to obtain prior to antibiotics being given     Status: None   Collection Time: 09/02/15  3:30 AM  Result Value Ref Range Status   Specimen Description BLOOD LEFT ARM  Final   Special Requests BOTTLES DRAWN AEROBIC AND ANAEROBIC  Final   Culture NO GROWTH 5 DAYS  Final   Report Status 09/07/2015 FINAL  Final  MRSA PCR Screening     Status: Abnormal   Collection Time: 09/02/15  1:16 PM  Result Value Ref Range Status   MRSA by PCR POSITIVE (A) NEGATIVE Final    Comment:        The GeneXpert MRSA Assay (FDA approved for NASAL specimens only), is one component of a comprehensive MRSA colonization surveillance program. It is not intended to diagnose MRSA infection nor to guide or monitor treatment for MRSA infections. RESULT CALLED TO, READ BACK BY AND VERIFIED WITH: J. LAUER RN 15:45 09/02/15 ([email or web address])      Studies:   Recent x-ray studies have been reviewed in detail by the Attending Physician  Scheduled Meds:  Scheduled Meds: . antiseptic oral rinse  7 mL Mouth Rinse BID  . arformoterol  15 mcg Nebulization BID  . budesonide  0.25 mg Nebulization BID  . diltiazem  180 mg Oral Daily  . docusate sodium  100 mg Oral BID  . guaiFENesin  1,200 mg Oral BID  . insulin  aspart  0-15 Units Subcutaneous TID WC  . insulin aspart  0-5 Units Subcutaneous QHS  . ipratropium  0.5 mg Nebulization QID  . levalbuterol  0.63 mg Nebulization QID  . levofloxacin (LEVAQUIN) IV  500 mg Intravenous Q24H  . methylPREDNISolone (SOLU-MEDROL) injection  60 mg Intravenous Q12H  . pantoprazole  40 mg Oral Daily  . sodium chloride  3 mL Intravenous Q12H  . tiotropium  18 mcg Inhalation Daily  . warfarin  4 mg Oral ONCE-1800  . Warfarin - Pharmacist Dosing Inpatient  Does not apply q1800    Time spent on care of this patient: 25 mins   [facility] Surgery CenterMCCLUNG,Jillianna Stanek T , MD   [facility] Office  [phone] Pager - Text Page per Loretha StaplerAmion as per below:  On-Call/Text Page:      [email or web address]      password [email or web address]  If 7PM-7AM, please contact night-coverage [email or web address] Password [email or web address] 09/11/2015, 3:33 PM   LOS: 2 days

## 2015-09-11 NOTE — Progress Notes (Signed)
Received report from Pump BackKay, RN in [facility] for transfer of pt to [phone]

## 2015-09-11 NOTE — Progress Notes (Signed)
ANTICOAGULATION CONSULT NOTE - Follow Up Consult  Pharmacy Consult for Coumadin Indication: atrial fibrillation and History of DVT  No Known Allergies Patient Measurements: Height: 5\' 5"  (165.1 cm) Weight: 132 lb 11.5 oz (60.2 kg) IBW/kg (Calculated) : 57 Vital Signs: Temp: 97.7 F (36.5 C) (12/24 0821) Temp Source: Oral (12/24 0821) BP: 144/75 mmHg (12/24 0821) Pulse Rate: 120 (12/24 0821) Labs:  Recent Labs  09/09/15 1350 09/10/15 0345 09/11/15 0242  HGB 10.8* 9.1* 8.7*  HCT 37.3 31.1* 28.8*  PLT 426* 318 316  LABPROT 18.7* 17.1* 22.4*  INR 1.56* 1.38 1.98*  CREATININE 0.94 0.93 0.82   Estimated Creatinine Clearance: 55 mL/min (by C-G formula based on Cr of 0.82).  Assessment: 74 year old female on chronic Coumadin for Afib and history of DVT. Recent discharge on 12/20 after COPD exacerbation in which she was discharged on Levaquin and Coumadin 3mg  daily. INR at time of discharge was 3.05- dose continued per patient. Readmitted 12/22 and  INR had dropped down to 1.56. Hgb/Plts stable. DDI- LVQ.  LVQ dose charted "not given, comment: discontinued" 12/24.    INR today 1.98. Note in reviewing records from last admission, patient had required larger doses of 5mg  during stay and likely needed higher daily dose than 3mg  daily.  CBC stable, no bleeding reported.   Goal of Therapy:  INR 2-3 Monitor platelets by anticoagulation protocol: Yes   Plan:  Coumadin 4 mg po x1 today.  Daily PT/INR.  Monitor closely on Levaquin IV therapy. Dose not given yesterday.   Herby AbrahamMichelle T. Kriste Broman, Pharm.D. [phone] 09/11/2015 8:41 AM

## 2015-09-12 DIAGNOSIS — J441 Chronic obstructive pulmonary disease with (acute) exacerbation: Principal | ICD-10-CM

## 2015-09-12 LAB — GLUCOSE, CAPILLARY
GLUCOSE-CAPILLARY: 209 mg/dL — AB (ref 65–99)
Glucose-Capillary: 187 mg/dL — ABNORMAL HIGH (ref 65–99)
Glucose-Capillary: 183 mg/dL — ABNORMAL HIGH (ref 65–99)
Glucose-Capillary: 163 mg/dL — ABNORMAL HIGH (ref 65–99)
Glucose-Capillary: 148 mg/dL — ABNORMAL HIGH (ref 65–99)

## 2015-09-12 LAB — BASIC METABOLIC PANEL
Anion gap: 10 (ref 5–15)
BUN: 24 mg/dL — AB (ref 6–20)
CALCIUM: 8.2 mg/dL — AB (ref 8.9–10.3)
CO2: 29 mmol/L (ref 22–32)
CREATININE: 0.71 mg/dL (ref 0.44–1.00)
Chloride: 98 mmol/L — ABNORMAL LOW (ref 101–111)
GFR calc Af Amer: >60 mL/min (ref >60)
GFR calc non Af Amer: >60 mL/min (ref >60)
GLUCOSE: 199 mg/dL — AB (ref 65–99)
Potassium: 4.6 mmol/L (ref 3.5–5.1)
Sodium: 137 mmol/L (ref 135–145)

## 2015-09-12 LAB — CBC
HEMATOCRIT: 29.8 % — AB (ref 36.0–46.0)
Hemoglobin: 8.7 g/dL — ABNORMAL LOW (ref 12.0–15.0)
MCH: 23.5 pg — AB (ref 26.0–34.0)
MCHC: 29.2 g/dL — AB (ref 30.0–36.0)
MCV: 80.3 fL (ref 78.0–100.0)
Platelets: 327 10*3/uL (ref 150–400)
RBC: 3.71 MIL/uL — ABNORMAL LOW (ref 3.87–5.11)
RDW: 18 % — AB (ref 11.5–15.5)
WBC: 16.8 10*3/uL — ABNORMAL HIGH (ref 4.0–10.5)

## 2015-09-12 LAB — PROTIME-INR
INR: 2.09 — AB (ref 0.00–1.49)
Prothrombin Time: 23.3 seconds — ABNORMAL HIGH (ref 11.6–15.2)

## 2015-09-12 MED ORDER — IPRATROPIUM BROMIDE 0.02 % IN SOLN
0.5000 mg | Freq: Three times a day (TID) | RESPIRATORY_TRACT | Status: DC
  Administered 2015-09-12 – 2015-09-14 (×5): 0.5 mg via RESPIRATORY_TRACT
  Filled 2015-09-12 (×6): qty 2.5

## 2015-09-12 MED ORDER — LEVOFLOXACIN 500 MG PO TABS
500.0000 mg | ORAL_TABLET | ORAL | Status: AC
  Administered 2015-09-12 – 2015-09-14 (×3): 500 mg via ORAL
  Filled 2015-09-12 (×3): qty 1

## 2015-09-12 MED ORDER — LEVALBUTEROL HCL 0.63 MG/3ML IN NEBU
0.6300 mg | INHALATION_SOLUTION | Freq: Three times a day (TID) | RESPIRATORY_TRACT | Status: DC
  Administered 2015-09-12 – 2015-09-14 (×5): 0.63 mg via RESPIRATORY_TRACT
  Filled 2015-09-12 (×6): qty 3

## 2015-09-12 MED ORDER — PREDNISONE 50 MG PO TABS
50.0000 mg | ORAL_TABLET | Freq: Every day | ORAL | Status: DC
Start: 2015-09-12 — End: 2015-09-14
  Administered 2015-09-12 – 2015-09-14 (×3): 50 mg via ORAL
  Filled 2015-09-12 (×3): qty 1

## 2015-09-12 MED ORDER — WARFARIN SODIUM 4 MG PO TABS
4.0000 mg | ORAL_TABLET | Freq: Once | ORAL | Status: AC
  Administered 2015-09-12: 4 mg via ORAL
  Filled 2015-09-12 (×2): qty 1

## 2015-09-12 MED ORDER — GLIMEPIRIDE 1 MG PO TABS
1.0000 mg | ORAL_TABLET | Freq: Every day | ORAL | Status: DC
Start: 2015-09-13 — End: 2015-09-14
  Administered 2015-09-13 – 2015-09-14 (×2): 1 mg via ORAL
  Filled 2015-09-12 (×3): qty 1

## 2015-09-12 NOTE — Progress Notes (Signed)
ANTICOAGULATION CONSULT NOTE - Follow Up Consult  Pharmacy Consult for Coumadin Indication: atrial fibrillation and History of DVT  No Known Allergies Patient Measurements: Height: 5\' 5"  (165.1 cm) Weight: 143 lb 11.8 oz (65.2 kg) IBW/kg (Calculated) : 57 Vital Signs: Temp: 97.5 F (36.4 C) (12/25 0518) Temp Source: Oral (12/25 0518) BP: 137/61 mmHg (12/25 0518) Pulse Rate: 100 (12/25 0748) Labs:  Recent Labs  09/10/15 0345 09/11/15 0242 09/12/15 0651  HGB 9.1* 8.7* 8.7*  HCT 31.1* 28.8* 29.8*  PLT 318 316 327  LABPROT 17.1* 22.4* 23.3*  INR 1.38 1.98* 2.09*  CREATININE 0.93 0.82 0.71   Estimated Creatinine Clearance: 56.4 mL/min (by C-G formula based on Cr of 0.71).  Assessment: 74 year old female on chronic Coumadin for Afib and history of DVT. Recent discharge on 12/20 after COPD exacerbation in which she was discharged on Levaquin and Coumadin 3mg  daily. INR at time of discharge was 3.05- dose continued per patient. Readmitted 12/22 and  INR had dropped down to 1.56. Hgb/Plts stable.    Note in reviewing records from last admission, patient had required larger doses of 5mg  during stay and likely needed higher daily dose than 3mg  daily.    INR today 2.09. (of note, patient did not receive 12/24 coumadin dose for unknown reason). CBC stable, no bleeding reported.   Goal of Therapy:  INR 2-3 Monitor platelets by anticoagulation protocol: Yes   Plan:  Coumadin 4 mg po x1 tonight  Daily PT/INR/CBC Monitor for S/S of bleeding  Marqueta Pulley C. Marvis MoellerMiles, PharmD Pharmacy Resident  Pager: [phone] 09/12/2015 8:23 AM

## 2015-09-12 NOTE — Progress Notes (Signed)
Patient is resting comfortably on 3LNC at this time and is in no ditress. BIPAP is not needed at this time.

## 2015-09-12 NOTE — Progress Notes (Signed)
PROGRESS NOTE  Brenda Wells ZOX:[id number] DOB: 1940-09-30 DOA: 09/09/2015 PCP: [name],[name], MD  Admit HPI / Brief Narrative:  74 year old female with COPD with chronic hypoxic respiratory failure on 4 L O2 via nasal cannula, chronic A. fib on Coumadin, chronic diastolic CHF, and history of DVT who was discharged from the hospital on 12/20 after being admitted for acute COPD exacerbation with respiratory failure and was initially doing well at home until 12/22 when she had acute worsening of her breathing and cough, and presented backt to the ED.    Patient was tachycardic in the ED with ABG showing hypercapnic respiratory failure. Required BiPAP and was given IV Solu-Medrol, nebs and Levaquin. Admitted to stepdown unit  HPI/Subjective:  Patient is sitting up in the bed, denies any headache fever chills, no chest pain, cough and shortness of breath have improved.  No abdominal pain.No focal weakness.  Assessment/Plan:  Acute on chronic hypoxic and hypercapnic respiratory failure secondary to COPD exacerbation Slowly improving - cont usual medical tx - begin very slow/prolonged steroid taper - will need to be able to ambulate w/o signif desaturation or exhaustion prior to d/c home - will be very high risk for re-admit given severity of her lung disease, counseled to quit smoking. We'll have her follow with pulmonary outpatient. Flutter valve added.  Chronic diastolic CHF EF on recent echo a few months ago was 55% No exam findings to suggest signif volume overload - she is at her apparent baseline wgt of ~60kg   Chronic Atrial fib on Coumadin , [place] vascular 2 score greater than 4 HR controlled, but w/ some episodes of borderline tachy - adjust med tx - cont warfarin per Rx - follow HR w/ exertion   Hx of DVT Remains on warfarin, pharmacy monitoring INR.   DM type II. A1c was 7, Will place on Amaryl, diabetic teaching. Continue sliding scale here.    CBG (last 3)   Recent  Labs  09/11/15 1755 09/11/15 2219 09/12/15 0758  GLUCAP 284* 187* 183*        Code Status: DNR Family Communication: no family present at time of exam Disposition Plan: PT suggests 24hr supervision - OT suggests HHOT as well - transfer to tele bed - continue to ambulate and assess dyspnea on exertion - ?d/c in 48-72hrs   Consultants: none  Procedures: none  Antibiotics: levaquin 12/22 > 12/24  DVT prophylaxis: warfarin   Objective: Blood pressure 137/61, pulse 100, temperature 97.5 F (36.4 C), temperature source Oral, resp. rate 24, height 5\' 5"  (1.651 m), weight 65.2 kg (143 lb 11.8 oz), SpO2 96 %.  Intake/Output Summary (Last 24 hours) at 09/12/15 1008 Last data filed at 09/12/15 0521  Gross per 24 hour  Intake  876.5 ml  Output   1300 ml  Net -423.5 ml   Exam: General: no wheezing on exam today  Lungs: very poor air movement th/o - no focal crackles  Cardiovascular: very distant HS - regular rate - no M Abdomen: nontender, nondistended, soft, bowel sounds positive Extremities: no significant cyanosis, clubbing, or edema bilateral lower extremities  Data Reviewed: Basic Metabolic Panel:  Recent Labs Lab 09/06/15 0440 09/09/15 1350 09/10/15 0345 09/11/15 0242 09/12/15 0651  NA 135 133* 136 138 137  K 4.3 5.0 4.7 4.3 4.6  CL 88* 86* 91* 98* 98*  CO2 37* 31 33* 33* 29  GLUCOSE 196* 301* 202* 219* 199*  BUN 29* 34* 29* 24* 24*  CREATININE 0.90 0.94 0.93 0.82 0.71  CALCIUM 8.3* 8.5* 8.1* 8.2* 8.2*    CBC:  Recent Labs Lab 09/07/15 0505 09/09/15 1350 09/10/15 0345 09/11/15 0242 09/12/15 0651  WBC 13.3* 20.1* 8.5 13.8* 16.8*  NEUTROABS  --  18.5*  --   --   --   HGB 9.3* 10.8* 9.1* 8.7* 8.7*  HCT 30.6* 37.3 31.1* 28.8* 29.8*  MCV 81.0 79.9 79.5 78.9 80.3  PLT 362 426* 318 316 327    Liver Function Tests:  Recent Labs Lab 09/06/15 0440 09/10/15 0345 09/11/15 0242  AST ALT ALKPHOS 52 54 45  BILITOT 0.5 0.5 0.2*   PROT 5.2* 5.3* 4.9*  ALBUMIN 2.6* 2.8* 2.5*    Recent Labs Lab 09/05/15 1430  LIPASE 51   Coags:  Recent Labs Lab 09/07/15 0505 09/09/15 1350 09/10/15 0345 09/11/15 0242 09/12/15 0651  INR 3.05* 1.56* 1.38 1.98* 2.09*   Cardiac Enzymes: No results for input(s): CKTOTAL, CKMB, CKMBINDEX, TROPONINI in the last 168 hours.  CBG:  Recent Labs Lab 09/11/15 0814 09/11/15 1304 09/11/15 1755 09/11/15 2219 09/12/15 0758  GLUCAP 216* 167* 284* 187* 183*    Recent Results (from the past 240 hour(s))  MRSA PCR Screening     Status: Abnormal   Collection Time: 09/02/15  1:16 PM  Result Value Ref Range Status   MRSA by PCR POSITIVE (A) NEGATIVE Final    Comment:        The GeneXpert MRSA Assay (FDA approved for NASAL specimens only), is one component of a comprehensive MRSA colonization surveillance program. It is not intended to diagnose MRSA infection nor to guide or monitor treatment for MRSA infections. RESULT CALLED TO, READ BACK BY AND VERIFIED WITH: J. LAUER RN 15:45 09/02/15 ([email or web address])      Studies:   Recent x-ray studies have been reviewed in detail by me  Scheduled Meds:  Scheduled Meds: . antiseptic oral rinse  7 mL Mouth Rinse BID  . arformoterol  15 mcg Nebulization BID  . budesonide  0.25 mg Nebulization BID  . diltiazem  240 mg Oral Daily  . docusate sodium  100 mg Oral BID  . guaiFENesin  1,200 mg Oral BID  . insulin aspart  0-15 Units Subcutaneous TID WC  . insulin aspart  0-5 Units Subcutaneous QHS  . ipratropium  0.5 mg Nebulization QID  . levalbuterol  0.63 mg Nebulization QID  . pantoprazole  40 mg Oral Daily  . predniSONE  40 mg Oral BID WC  . sodium chloride  3 mL Intravenous Q12H  . tiotropium  18 mcg Inhalation Daily  . warfarin  4 mg Oral ONCE-1800  . Warfarin - Pharmacist Dosing Inpatient   Does not apply q1800    Time spent on care of this patient: 25 mins  Susa Raring K M.D on 09/12/2015 at 10:08 AM  Between 7am to  7pm - Pager - [phone], After 7pm go to [email or web address] - password [facility]  [facility]  - Office  [phone]   LOS: 3 days

## 2015-09-12 NOTE — Progress Notes (Signed)
PT Cancellation Note  Patient Details Name: Brenda Wells MRN: [id number] DOB: 1941-05-11   Cancelled Treatment:    Reason Eval/Treat Not Completed: Other (comment) Imminent discharge order acknowledged. Patient evaluated Friday with recommendation for HHPT. Noted discharge estimated for 48-72 hours from 12/25. Will follow up Monday for progression of therapy, monitoring vitals, specifically O2 saturation.  Berton Mount[place]bour, Tonnia Bardin S 09/12/2015, 3:41 PM  Sunday SpillersLogan Secor Scottsd[place]bour, [place] [phone]

## 2015-09-13 ENCOUNTER — Inpatient Hospital Stay (HOSPITAL_COMMUNITY): Payer: Medicare Other

## 2015-09-13 LAB — CBC
HEMATOCRIT: 32.9 % — AB (ref 36.0–46.0)
HEMOGLOBIN: 9.8 g/dL — AB (ref 12.0–15.0)
MCH: 24 pg — ABNORMAL LOW (ref 26.0–34.0)
MCHC: 29.8 g/dL — ABNORMAL LOW (ref 30.0–36.0)
MCV: 80.6 fL (ref 78.0–100.0)
Platelets: 341 10*3/uL (ref 150–400)
RBC: 4.08 MIL/uL (ref 3.87–5.11)
RDW: 18.3 % — ABNORMAL HIGH (ref 11.5–15.5)
WBC: 19 10*3/uL — AB (ref 4.0–10.5)

## 2015-09-13 LAB — GLUCOSE, CAPILLARY
GLUCOSE-CAPILLARY: 138 mg/dL — AB (ref 65–99)
Glucose-Capillary: 108 mg/dL — ABNORMAL HIGH (ref 65–99)
Glucose-Capillary: 168 mg/dL — ABNORMAL HIGH (ref 65–99)

## 2015-09-13 LAB — PROTIME-INR
INR: 1.11 (ref 0.00–1.49)
Prothrombin Time: 14.5 seconds (ref 11.6–15.2)

## 2015-09-13 MED ORDER — APIXABAN 5 MG PO TABS
5.0000 mg | ORAL_TABLET | Freq: Two times a day (BID) | ORAL | Status: DC
  Administered 2015-09-13 – 2015-09-14 (×3): 5 mg via ORAL
  Filled 2015-09-13 (×3): qty 1

## 2015-09-13 MED ORDER — METOLAZONE 2.5 MG PO TABS
2.5000 mg | ORAL_TABLET | Freq: Once | ORAL | Status: AC
  Administered 2015-09-13: 2.5 mg via ORAL
  Filled 2015-09-13: qty 1

## 2015-09-13 MED ORDER — LIVING WELL WITH DIABETES BOOK
Freq: Once | Status: AC
  Administered 2015-09-13: 11:00:00
  Filled 2015-09-13: qty 1

## 2015-09-13 MED ORDER — FUROSEMIDE 10 MG/ML IJ SOLN
40.0000 mg | Freq: Once | INTRAMUSCULAR | Status: AC
  Administered 2015-09-13: 40 mg via INTRAVENOUS
  Filled 2015-09-13: qty 4

## 2015-09-13 MED ORDER — FUROSEMIDE 40 MG PO TABS
40.0000 mg | ORAL_TABLET | Freq: Every day | ORAL | Status: DC
  Administered 2015-09-14: 40 mg via ORAL
  Filled 2015-09-13: qty 1

## 2015-09-13 NOTE — Care Management Note (Addendum)
Case Management Note  Patient Details  Name: Brenda Wells MRN: [id number] Date of Birth: 1941/04/28  Subjective/Objective:                 Patient admitted from home with advanced COPD. Uses [facility] to supply home O2 at 4L, and nebulizer, [facility]. [facility] 09/13/15.  Patient lives at home with part time supervision by her son, states "I don't want him around me all the time." Has two walkers and a wheelchair. Denies any other CM resources.   Action/Plan:  Referral made to resume services to [facility].  Expected Discharge Date:                  Expected Discharge Plan:  Home w Home Health Services  In-House Referral:     Discharge planning Services  CM Consult  Post Acute Care Choice:    Choice offered to:     DME Arranged:    DME Agency:     HH Arranged:  RN HH Agency[facility]  [facility]  Status of Service:  In process, will continue to follow  Medicare Important Message Given:  Yes Date Medicare IM Given:    Medicare IM give by:    Date Additional Medicare IM Given:    Additional Medicare Important Message give by:     If discussed at Long Length of Stay Meetings, dates discussed:    Additional Comments:  Lawerance SabalDebbie Jc Veron, RN 09/13/2015, 1:55 PM

## 2015-09-13 NOTE — Progress Notes (Signed)
Nurse educated pt with "Living With Diabetes Book". Pt verbalized understanding. Pt assisted in checking blood sugar. Pt did well with that. Pt refused to administer insulin. Pt stated, "I'm not going to give myself a shot. If they want me to take something to help, they better give me a pill".

## 2015-09-13 NOTE — Progress Notes (Signed)
Physical Therapy Treatment Patient Details Name: Brenda Wells G [place] MRN: [id number] DOB: 1941/04/19 Today's Date: 09/13/2015    History of Present Illness 74 year old female with COPD with chronic hypoxic respiratory failure on 4 L O2 via nasal cannula, chronic A. fib on Coumadin, chronic diastolic CHF and history of DVT who was recently discharged from the hospital on 12/20 after being admitted for acute COPD exacerbation with respiratory failure was doing well at home and went to see her PCP this afternoon when she had worsened breathing and cough--readmitted for  Acute on chronic respiratory failure with hypoxia /COPD exacerbation    PT Comments    Patient fatigued from > 9 hours out of bed today. Very pleasant and discussed her mobility concerns re: d/c home--primarily up/down steps with son's assist. Discussed options and determined pt may be able to step up sideways with railing. She wants to practice in the morning when less fatigued.    Follow Up Recommendations  No PT follow up;Supervision for mobility/OOB     Equipment Recommendations  None recommended by PT    Recommendations for Other Services       Precautions / Restrictions Precautions Precautions: Fall Precaution Comments: watch HR and sats    Mobility                                                         Stairs Stairs:  (see comments)          Wheelchair Mobility    Modified Rankin (Stroke Patients Only)                                           Cognition Arousal/Alertness: Awake/alert Behavior During Therapy: Anxious Overall Cognitive Status: Within Functional Limits for tasks assessed                            General Comments General comments (skin integrity, edema, etc.): Patient with recent return to bed after sitting up >9 hrs and too fatigued to try stairs. This is her main mobility concern as her son hurt his back taking her down the  steps in w/c to MD office just PTA. She desperately wants to walk up steps (5; Rt rail) to enter home. Discussed how she has walked up in the past (std walker forwards with only 2 legs supported on each step. educated re: risks of this technique. Discussed alternative methods pro's/con's and pt ultimately agreed she thinks she could go up sideways with bil hands on Rt rail. Agreed she wants to roll to steps and practice prior to d/c.       Pertinent Vitals/Pain Pain Assessment: No/denies pain    Home Living                      Prior Function            PT Goals (current goals can now be found in the care plan section) Acute Rehab PT Goals Patient Stated Goal: to walk up her steps so son does not have to carry her in w/c Time For Goal Achievement: 09/17/15 Progress towards PT goals: Not progressing toward goals - comment (fatigue  limiting; will need to see in morning hours)    Frequency  Min 3X/week    PT Plan Discharge plan needs to be updated;Equipment recommendations need to be updated    Co-evaluation             End of Session   Activity Tolerance: Patient limited by fatigue Patient left: in bed;with call bell/phone within reach;with bed alarm set     Time: 1610-9604 PT Time Calculation (min) (ACUTE ONLY): 16 min  Charges:  $Gait Training: 8-22 mins                    G Codes:      Taffy Delconte 09-14-2015, 4:53 PM Pager [phone]

## 2015-09-13 NOTE — Progress Notes (Signed)
ANTICOAGULATION CONSULT NOTE - Follow Up Consult  Pharmacy Consult for Eliquis Indication: atrial fibrillation and History of DVT  No Known Allergies Patient Measurements: Height: 5\' 5"  (165.1 cm) Weight: 143 lb 11.8 oz (65.2 kg) IBW/kg (Calculated) : 57 Vital Signs: Temp: 98 F (36.7 C) (12/26 0546) Temp Source: Oral (12/26 0546) BP: 143/66 mmHg (12/26 0546) Pulse Rate: 90 (12/26 0810) Labs:  Recent Labs  09/11/15 0242 09/12/15 0651 09/13/15 0630  HGB 8.7* 8.7* 9.8*  HCT 28.8* 29.8* 32.9*  PLT 316 327 341  LABPROT 22.4* 23.3* 14.5  INR 1.98* 2.09* 1.11  CREATININE 0.82 0.71  --    Estimated Creatinine Clearance: 56.4 mL/min (by C-G formula based on Cr of 0.71).  Assessment: 74 year old female on chronic Coumadin for Afib and history of DVT. Recent discharge on 12/20 after COPD exacerbation in which she was discharged on Levaquin and Coumadin 3mg  daily. INR at time of discharge was 3.05- dose continued per patient. Readmitted 12/22 and  INR had dropped down to 1.56. Went up to 2.09 but back down to 1.11 after missing 12/24 dose.   Now to switch to Eliquis due to unstable INR over past month. Age < 80, weight > 60 kg, and SCr stable. Hgb low but stable at 9.8, plts wnl. No s/s of bleed.  Goal of Therapy:  Monitor platelets by anticoagulation protocol: Yes   Plan:  Stop coumadin Start Eliquis 5mg  PO BID Monitor CBC, s/s of bleed F/U education  Enzo BiNathan Treshun Wold, PharmD, [facility] Clinical Pharmacist Pager [phone] 09/13/2015 10:21 AM

## 2015-09-13 NOTE — Progress Notes (Signed)
Occupational Therapy Treatment Patient Details Name: Brenda Wells MRN: [id number] DOB: [birth year] Today's Date: 09/13/2015    History of present illness 74 year old female with COPD with chronic hypoxic respiratory failure on 4 L O2 via nasal cannula, chronic A. fib on Coumadin, chronic diastolic CHF and history of DVT who was recently discharged from the hospital on 12/20 after being admitted for acute COPD exacerbation with respiratory failure was doing well at home and went to see her PCP this afternoon when she had worsened breathing and cough--readmitted for  Acute on chronic respiratory failure with hypoxia /COPD exacerbation   OT comments  Pt performing seated grooming at sink, toileting and LB dressing at a supervision level with RW. Pt utilizes breathing techniques during exertion.  Cued to slow pace. Pt declined sponge bathing as she is hopeful to go home today.  Follow Up Recommendations  Home health OT;Supervision/Assistance - 24 hour    Equipment Recommendations  None recommended by OT    Recommendations for Other Services      Precautions / Restrictions Precautions Precautions: Fall Precaution Comments: watch HR and sats       Mobility Bed Mobility               General bed mobility comments: pt in chair  Transfers Overall transfer level: Needs assistance Equipment used: Rolling walker (2 wheeled) Transfers: Sit to/from Stand Sit to Stand: Supervision         General transfer comment: supervision for safety, no physical assist needed    Balance                                   ADL Overall ADL's : Needs assistance/impaired     Grooming: Sitting;Wash/dry hands;Wash/dry face;Oral care;Brushing hair;Set up Grooming Details (indicate cue type and reason): at sink             Lower Body Dressing: Supervision/safety;Sit to/from stand   Toilet Transfer: Supervision/safety;RW;Ambulation   Toileting- ArchitectClothing Manipulation and  Hygiene: Supervision/safety;Sit to/from stand       Functional mobility during ADLs: Supervision/safety;Rolling walker General ADL Comments: Pt educated to slow pace with ambulation, maintaining cool room with good circulation.  Pt utilizing pursed lip breathing during mobility and ADL. Recommended pt consider a rollator for energy conservation.      Vision                     Perception     Praxis      Cognition   Behavior During Therapy: Anxious Overall Cognitive Status: Within Functional Limits for tasks assessed                       Extremity/Trunk Assessment               Exercises     Shoulder Instructions       General Comments      Pertinent Vitals/ Pain       Pain Assessment: No/denies pain  Home Living                                          Prior Functioning/Environment              Frequency Min 2X/week     Progress Toward Goals  OT Goals(current goals  can now be found in the care plan section)  Progress towards OT goals: Progressing toward goals  Acute Rehab OT Goals Patient Stated Goal: to get better and not have to come back  Time For Goal Achievement: 09/24/15  Plan Discharge plan remains appropriate    Co-evaluation                 End of Session Equipment Utilized During Treatment: Rolling walker;Oxygen   Activity Tolerance Patient limited by fatigue   Patient Left in chair;with call bell/phone within reach;with chair alarm set   Nurse Communication          Time: 1010-1033 OT Time Calculation (min): 23 min  Charges: OT General Charges $OT Visit: 1 Procedure OT Treatments $Self Care/Home Management : 23-37 mins  Evern Bio 09/13/2015, 10:38 AM  [phone]

## 2015-09-13 NOTE — Care Management Important Message (Signed)
Important Message  Patient Details  Name: Brenda Wells MRN: [id number] Date of Birth: Jan 18, 1941   Medicare Important Message Given:  Yes    Lawerance Sabalebbie Remberto Lienhard, RN 09/13/2015, 11:48 AMImportant Message  Patient Details  Name: Brenda Wells MRN: [id number] Date of Birth: Jan 18, 1941   Medicare Important Message Given:  Yes    Lawerance Sabalebbie Ayden Apodaca, RN 09/13/2015, 11:48 AM

## 2015-09-13 NOTE — Progress Notes (Signed)
Noted consult for diabetes coordinator. However, diabetes coordinator is not on site today and is reviewing consults remotely. Patient is being diagnosed with new onset DM and will need diabetes education. Ordered Living Well With Diabetes booklet, nursing care order for diabetes education, patient education videos, and RD consult.   NURSING, please provide patient with diabetes education and review Living Well with Diabetes booklet with patient.   MD, at time of discharge please order outpatient referral for diabetes education.  Thanks, Orlando PennerMarie Krissy Orebaugh, RN, MSN, CDE Diabetes Coordinator Inpatient Diabetes Program [phone] (Team Pager from 8am to 5pm) [phone] (AP office) [phone] [facility] office) [phone] [facility] office)

## 2015-09-13 NOTE — Progress Notes (Signed)
PROGRESS NOTE  Brenda Wells ZOX:[id number] DOB: 07/21/1941 DOA: 09/09/2015 PCP: [name],[name], MD  Admit HPI / Brief Narrative:  74 year old female with COPD with chronic hypoxic respiratory failure on 4 L O2 via nasal cannula, chronic A. fib on Coumadin, chronic diastolic CHF, and history of DVT who was discharged from the hospital on 12/20 after being admitted for acute COPD exacerbation with respiratory failure and was initially doing well at home until 12/22 when she had acute worsening of her breathing and cough, and presented backt to the ED.    Patient was tachycardic in the ED with ABG showing hypercapnic respiratory failure. Required BiPAP and was given IV Solu-Medrol, nebs and Levaquin. Admitted to stepdown unit  HPI/Subjective:  Patient is sitting up in the bed, denies any headache fever chills, no chest pain, cough and shortness of breath have improved.  No abdominal pain.No focal weakness.  Assessment/Plan:  Acute on chronic hypoxic and hypercapnic respiratory failure secondary to COPD exacerbation Slowly improving - cont usual medical tx - begin very slow/prolonged steroid taper - will need to be able to ambulate w/o signif desaturation or exhaustion prior to d/c home - will be very high risk for re-admit given severity of her lung disease, counseled to quit smoking. We'll have her follow with pulmonary outpatient. Flutter valve added. Likely discharge in the morning with home PT.  Chronic diastolic CHF EF on recent echo a few months ago was 55% No exam findings to suggest signif volume overload - she is at her apparent baseline wgt of ~60kg , resume Lasix.  Chronic Atrial fib on Coumadin , [place] vascular 2 score greater than 4 HR controlled, but w/ some episodes of borderline tachy - adjust med tx - INR erratic switch to Eliquis  Hx of DVT All on Coumadin with inconsistent INR has been switched to Eliquis   DM type II. A1c was 7, Will place on Amaryl, diabetic  teaching. Continue sliding scale here.    CBG (last 3)   Recent Labs  09/12/15 1201 09/12/15 1501 09/12/15 2138  GLUCAP 163* 148* 209*        Code Status: DNR Family Communication: no family present at time of exam Disposition Plan: Likely discharge in the morning with home health PT.  Consultants: none  Procedures: none  Antibiotics: levaquin    DVT prophylaxis: Eliquis  Objective: Blood pressure 143/66, pulse 90, temperature 98 F (36.7 C), temperature source Oral, resp. rate 19, height 5\' 5"  (1.651 m), weight 65.2 kg (143 lb 11.8 oz), SpO2 98 %.  Intake/Output Summary (Last 24 hours) at 09/13/15 0946 Last data filed at 09/13/15 0935  Gross per 24 hour  Intake    480 ml  Output   3250 ml  Net  -2770 ml   Exam: General: no wheezing on exam today  Lungs: very poor air movement th/o - no focal crackles  Cardiovascular: very distant HS - regular rate - no M Abdomen: nontender, nondistended, soft, bowel sounds positive Extremities: no significant cyanosis, clubbing, or edema bilateral lower extremities  Data Reviewed: Basic Metabolic Panel:  Recent Labs Lab 09/09/15 1350 09/10/15 0345 09/11/15 0242 09/12/15 0651  NA 133* 136 138 137  K 5.0 4.7 4.3 4.6  CL 86* 91* 98* 98*  CO2 31 33* 33* 29  GLUCOSE 301* 202* 219* 199*  BUN 34* 29* 24* 24*  CREATININE 0.94 0.93 0.82 0.71  CALCIUM 8.5* 8.1* 8.2* 8.2*    CBC:  Recent Labs Lab 09/09/15 1350 09/10/15 0345 09/11/15  1610 09/12/15 0651 09/13/15 0630  WBC 20.1* 8.5 13.8* 16.8* 19.0*  NEUTROABS 18.5*  --   --   --   --   HGB 10.8* 9.1* 8.7* 8.7* 9.8*  HCT 37.3 31.1* 28.8* 29.8* 32.9*  MCV 79.9 79.5 78.9 80.3 80.6  PLT 426* 318 316 327 341    Liver Function Tests:  Recent Labs Lab 09/10/15 0345 09/11/15 0242  AST 17 16  ALT 20 17  ALKPHOS 54 45  BILITOT 0.5 0.2*  PROT 5.3* 4.9*  ALBUMIN 2.8* 2.5*   No results for input(s): LIPASE, AMYLASE in the last 168 hours. Coags:  Recent  Labs Lab 09/09/15 1350 09/10/15 0345 09/11/15 0242 09/12/15 0651 09/13/15 0630  INR 1.56* 1.38 1.98* 2.09* 1.11   Cardiac Enzymes: No results for input(s): CKTOTAL, CKMB, CKMBINDEX, TROPONINI in the last 168 hours.  CBG:  Recent Labs Lab 09/11/15 2219 09/12/15 0758 09/12/15 1201 09/12/15 1501 09/12/15 2138  GLUCAP 187* 183* 163* 148* 209*    No results found for this or any previous visit (from the past 240 hour(s)).   Studies:   Recent x-ray studies have been reviewed in detail by me  Scheduled Meds:  Scheduled Meds: . antiseptic oral rinse  7 mL Mouth Rinse BID  . arformoterol  15 mcg Nebulization BID  . budesonide  0.25 mg Nebulization BID  . diltiazem  240 mg Oral Daily  . docusate sodium  100 mg Oral BID  . glimepiride  1 mg Oral Q breakfast  . guaiFENesin  1,200 mg Oral BID  . insulin aspart  0-15 Units Subcutaneous TID WC  . insulin aspart  0-5 Units Subcutaneous QHS  . ipratropium  0.5 mg Nebulization TID  . levalbuterol  0.63 mg Nebulization TID  . levofloxacin  500 mg Oral Q24H  . pantoprazole  40 mg Oral Daily  . predniSONE  50 mg Oral Q breakfast  . sodium chloride  3 mL Intravenous Q12H  . tiotropium  18 mcg Inhalation Daily    Time spent on care of this patient: 25 mins  Susa Raring K M.D on 09/13/2015 at 9:46 AM  Between 7am to 7pm - Pager - [phone], After 7pm go to [email or web address] - password [facility]  [facility]  - Office  [phone]   LOS: 4 days

## 2015-09-14 LAB — GLUCOSE, CAPILLARY
GLUCOSE-CAPILLARY: 224 mg/dL — AB (ref 65–99)
GLUCOSE-CAPILLARY: 91 mg/dL (ref 65–99)
Glucose-Capillary: 81 mg/dL (ref 65–99)

## 2015-09-14 MED ORDER — PREDNISONE 5 MG PO TABS: ORAL_TABLET | ORAL | Status: DC

## 2015-09-14 MED ORDER — APIXABAN 5 MG PO TABS
5.0000 mg | ORAL_TABLET | Freq: Two times a day (BID) | ORAL | Status: DC
Start: 2015-09-14 — End: 2015-10-04

## 2015-09-14 NOTE — Plan of Care (Signed)
Problem: Food- and Nutrition-Related Knowledge Deficit (NB-1.1) Goal: Nutrition education Formal process to instruct or train a patient/client in a skill or to impart knowledge to help patients/clients voluntarily manage or modify food choices and eating behavior to maintain or improve health. Outcome: Adequate for Discharge  RD consulted for nutrition education regarding diabetes.     Lab Results  Component Value Date    HGBA1C 7.0* 09/04/2015    RD provided "Carbohydrate Counting for People with Diabetes" handout from the Academy of [employer] and [employer]etics. Discussed different food groups and their effects on blood sugar, emphasizing carbohydrate-containing foods. Provided list of carbohydrates and recommended serving sizes of common foods.  Discussed importance of controlled and consistent carbohydrate intake throughout the day. Provided examples of ways to balance meals/snacks and encouraged intake of high-fiber, whole grain complex carbohydrates. Teach back method used.  Expect fair compliance.  Body mass index is 23.92 kg/(m^2). Pt meets criteria for normal weight range based on current BMI.  Current diet order is Heart Healthy, patient is consuming approximately 75-100% of meals at this time. Labs and medications reviewed. No further nutrition interventions warranted at this time. RD contact information provided. If additional nutrition issues arise, please re-consult RD.  Darby Shadwick A. Mayford KnifeWilliams, RD, LDN, CDE Pager: [phone] After hours Pager: [phone]

## 2015-09-14 NOTE — Discharge Instructions (Addendum)
Information on my medicine - ELIQUIS (apixaban)  This medication education was reviewed with me or my healthcare representative as part of my discharge preparation.   Why was Eliquis prescribed for you? Eliquis was prescribed for you to reduce the risk of a blood clot forming that can cause a stroke if you have a medical condition called atrial fibrillation (a type of irregular heartbeat).  What do You need to know about Eliquis ? Take your Eliquis TWICE DAILY - one tablet in the morning and one tablet in the evening with or without food. If you have difficulty swallowing the tablet whole please discuss with your pharmacist how to take the medication safely.  Take Eliquis exactly as prescribed by your doctor and DO NOT stop taking Eliquis without talking to the doctor who prescribed the medication.  Stopping may increase your risk of developing a stroke.  Refill your prescription before you run out.  After discharge, you should have regular check-up appointments with your healthcare provider that is prescribing your Eliquis.  In the future your dose may need to be changed if your kidney function or weight changes by a significant amount or as you get older.  What do you do if you miss a dose? If you miss a dose, take it as soon as you remember on the same day and resume taking twice daily.  Do not take more than one dose of ELIQUIS at the same time to make up a missed dose.  Important Safety Information A possible side effect of Eliquis is bleeding. You should call your healthcare provider right away if you experience any of the following: ? Bleeding from an injury or your nose that does not stop. ? Unusual colored urine (red or dark brown) or unusual colored stools (red or black). ? Unusual bruising for unknown reasons. ? A serious fall or if you hit your head (even if there is no bleeding).  Some medicines may interact with Eliquis and might increase your risk of bleeding or  clotting while on Eliquis. To help avoid this, consult your healthcare provider or pharmacist prior to using any new prescription or non-prescription medications, including herbals, vitamins, non-steroidal anti-inflammatory drugs (NSAIDs) and supplements.  This website has more information on Eliquis (apixaban): http://www.eliquis.com/eliquis/home   Follow with Primary MD [name],[name], MD in 7 days   Get CBC, CMP, 2 view Chest X ray checked  by Primary MD next visit.    Activity: As tolerated with Full fall precautions use walker/cane & assistance as needed   Disposition Home     Diet:   Heart Healthy - Check your Weight same time everyday, if you gain over 2 pounds, or you develop in leg swelling, experience more shortness of breath or chest pain, call your Primary MD immediately. Follow Cardiac Low Salt Diet and 1.5 lit/day fluid restriction.   On your next visit with your primary care physician please Get Medicines reviewed and adjusted.   Please request your Prim.MD to go over all Hospital Tests and Procedure/Radiological results at the follow up, please get all Hospital records sent to your Prim MD by signing hospital release before you go home.   If you experience worsening of your admission symptoms, develop shortness of breath, life threatening emergency, suicidal or homicidal thoughts you must seek medical attention immediately by calling 911 or calling your MD immediately  if symptoms less severe.  You Must read complete instructions/literature along with all the possible adverse reactions/side effects for all the Medicines you  take and that have been prescribed to you. Take any new Medicines after you have completely understood and accpet all the possible adverse reactions/side effects.   Do not drive, operating heavy machinery, perform activities at heights, swimming or participation in water activities or provide baby sitting services if your were admitted for syncope  or siezures until you have seen by Primary MD or a Neurologist and advised to do so again.  Do not drive when taking Pain medications.    Do not take more than prescribed Pain, Sleep and Anxiety Medications  Special Instructions: If you have smoked or chewed Tobacco  in the last 2 yrs please stop smoking, stop any regular Alcohol  and or any Recreational drug use.  Wear Seat belts while driving.   Please note  You were cared for by a hospitalist during your hospital stay. If you have any questions about your discharge medications or the care you received while you were in the hospital after you are discharged, you can call the unit and asked to speak with the hospitalist on call if the hospitalist that took care of you is not available. Once you are discharged, your primary care physician will handle any further medical issues. Please note that NO REFILLS for any discharge medications will be authorized once you are discharged, as it is imperative that you return to your primary care physician (or establish a relationship with a primary care physician if you do not have one) for your aftercare needs so that they can reassess your need for medications and monitor your lab values.

## 2015-09-14 NOTE — Progress Notes (Signed)
Physical Therapy Treatment Patient Details Name: Brenda Wells MRN: [id number] DOB: May 23, 1941 Today's Date: 09/14/2015    History of Present Illness 74 year old female with COPD with chronic hypoxic respiratory failure on 4 L O2 via nasal cannula, chronic A. fib on Coumadin, chronic diastolic CHF and history of DVT who was recently discharged from the hospital on 12/20 after being admitted for acute COPD exacerbation with respiratory failure was doing well at home and went to see her PCP this afternoon when she had worsened breathing and cough--readmitted for  Acute on chronic respiratory failure with hypoxia /COPD exacerbation    PT Comments    Patient eager to practice steps in order to prepare for discharge home. Overall did well with slow pace, sequencing, and balance. HR did increase to 147 with SaO2 99% on 4L. After 4 minutes, HR 109-130. Patient demonstrates good knowledge of her limitations and energy conservation techniques.    Follow Up Recommendations  No PT follow up;Supervision for mobility/OOB     Equipment Recommendations  None recommended by PT    Recommendations for Other Services       Precautions / Restrictions Precautions Precautions: Fall Precaution Comments: watch HR and sats Restrictions Weight Bearing Restrictions: No    Mobility  Bed Mobility Overal bed mobility: Modified Independent             General bed mobility comments: into and out of bed  Transfers Overall transfer level: Needs assistance Equipment used: None Transfers: Sit to/from Stand Sit to Stand: Supervision;Min assist         General transfer comment: min assist from low toilet; supervision from bed, recliner  Ambulation/Gait Ambulation/Gait assistance: Min guard Ambulation Distance (Feet): 12 Feet (rest, 5, 5, 5 ) Assistive device: Rolling walker (2 wheeled);None Gait Pattern/deviations: Step-through pattern;Decreased stride length;Trunk flexed (without device,  reaching for UE support on counter, etc) Gait velocity: decreased       Stairs Stairs: Yes Stairs assistance: Min guard Stair Management: One rail Right;Step to pattern;Sideways Number of Stairs: 2 General stair comments: pt with good balance, slow pace  Wheelchair Mobility    Modified Rankin (Stroke Patients Only)       Balance                                    Cognition Arousal/Alertness: Awake/alert Behavior During Therapy: WFL for tasks assessed/performed Overall Cognitive Status: Within Functional Limits for tasks assessed                      Exercises      General Comments        Pertinent Vitals/Pain Pain Assessment: No/denies pain    Home Living               Home Equipment: Walker - 2 wheels;Shower seat;Bedside commode;Walker - standard;Wheelchair - manual      Prior Function            PT Goals (current goals can now be found in the care plan section) Acute Rehab PT Goals Patient Stated Goal: to walk up her steps so son does not have to carry her in w/c Time For Goal Achievement: 09/17/15 Progress towards PT goals:  (goals partially met (limited by resp status); plans d/c home)    Frequency  Min 3X/week    PT Plan Current plan remains appropriate    Co-evaluation  End of Session Equipment Utilized During Treatment: Gait belt;Oxygen Activity Tolerance: Patient limited by fatigue Patient left: in bed;with call bell/phone within reach;with bed alarm set     Time: 6015-6153 PT Time Calculation (min) (ACUTE ONLY): 50 min  Charges:  $Gait Training: 38-52 mins                    G Codes:      Kalik Hoare 2015-10-02, 10:43 AM Pager [phone]

## 2015-09-14 NOTE — Progress Notes (Signed)
NURSING PROGRESS NOTE  Brenda Wells [id number] Discharge Data: 09/14/2015 12:06 PM Attending Provider: Leroy SeaPrashant K Singh, MD WUJ:[id number]:[name],[name], MD     Brenda AsaEdna G Leyda to be D/C'd Home per MD order.  Discussed with the patient the After Visit Summary and all questions fully answered. All IV's discontinued with no bleeding noted. All belongings returned to patient for patient to take home.   Last Vital Signs:  Blood pressure 109/72, pulse 103, temperature 98.3 F (36.8 C), temperature source Oral, resp. rate 20, height 5\' 5"  (1.651 m), weight 65.2 kg (143 lb 11.8 oz), SpO2 98 %.  Discharge Medication List   Medication List    STOP taking these medications        levofloxacin 500 MG tablet  Commonly known as:  LEVAQUIN     predniSONE 10 MG tablet  Commonly known as:  DELTASONE  Replaced by:  predniSONE 5 MG tablet     warfarin 3 MG tablet  Commonly known as:  COUMADIN      TAKE these medications        albuterol (2.5 MG/3ML) 0.083% nebulizer solution  Commonly known as:  PROVENTIL  Take 3 mLs (2.5 mg total) by nebulization every 2 (two) hours as needed for wheezing or shortness of breath.     apixaban 5 MG Tabs tablet  Commonly known as:  ELIQUIS  Take 1 tablet (5 mg total) by mouth 2 (two) times daily.     arformoterol 15 MCG/2ML Nebu  Commonly known as:  BROVANA  Take 2 mLs (15 mcg total) by nebulization 2 (two) times daily.     budesonide 0.25 MG/2ML nebulizer solution  Commonly known as:  PULMICORT  Take 2 mLs (0.25 mg total) by nebulization 2 (two) times daily.     diltiazem 180 MG 24 hr capsule  Commonly known as:  CARDIZEM CD  Take 1 capsule (180 mg total) by mouth daily.     furosemide 40 MG tablet  Commonly known as:  LASIX  Take 40 mg by mouth daily.     guaiFENesin 600 MG 12 hr tablet  Commonly known as:  MUCINEX  Take 2 tablets (1,200 mg total) by mouth 2 (two) times daily.     ipratropium-albuterol 0.5-2.5 (3) MG/3ML Soln  Commonly known as:   DUONEB  Take 3 mLs by nebulization 4 (four) times daily.     pantoprazole 40 MG tablet  Commonly known as:  PROTONIX  Take 40 mg by mouth daily.     predniSONE 5 MG tablet  Commonly known as:  DELTASONE  Label  & dispense according to the schedule below. 10 Pills PO for 3 days then, 8 Pills PO for 3 days, 6 Pills PO for 3 days, 4 Pills PO for 3 days, 2 Pills PO for 3 days, 1 Pills PO for 3 days, 1/2 Pill  PO for 3 days then STOP. Total 95 pills.     SPIRIVA HANDIHALER 18 MCG inhalation capsule  Generic drug:  tiotropium  Place 18 mcg into inhaler and inhale daily.         Bennie Pieriniyndi Khamauri Bauernfeind, RN

## 2015-09-14 NOTE — Care Management Note (Signed)
Case Management Note  Patient Details  Name: Brenda Wells MRN: [id number] Date of Birth: 09/04/1941  Subjective/Objective:                    Action[facility]Plan[facility]  Expected Discharge Date:                  Expected Discharge Plan:  Home w Home Health Services  In-House Referral:     Discharge planning Services  CM Consult  Post Acute Care Choice:  Home Health Choice offered to:  Patient  DME Arranged:    DME Agency:     HH Arranged:  RN HH [facility]  Status of Service:  Completed, signed off  Medicare Important Message Given:  Yes Date Medicare IM Given:    Medicare IM give by:    Date Additional Medicare IM Given:    Additional Medicare Important Message give by:     If discussed at Long Length of Stay Meetings, dates discussed:    Additional Comments:  Lawerance SabalDebbie Nesbit Michon, RN 09/14/2015, 12:28 PM

## 2015-09-14 NOTE — Discharge Summary (Signed)
Brenda Wells, is a 74 y.o. female  DOB 1941/08/12  MRN [id number].  Admission date:  09/09/2015  Admitting Physician  No admitting provider for patient encounter.  Discharge Date:  09/14/2015   Primary MD  [name], MD  Recommendations for primary care physician for things to follow:   Check CBC, BMP and a 2 view chest x-ray in 7-10 days. She must follow with pulmonary outpatient for advanced COPD.   Admission Diagnosis  COPD exacerbation (HCC) [J44.1]   Discharge Diagnosis  COPD exacerbation (HCC) [J44.1]     Principal Problem:   COPD exacerbation (HCC) Active Problems:   Acute on chronic respiratory failure with hypoxia (HCC)   Chronic atrial fibrillation (HCC)   History of DVT (deep vein thrombosis)   Anemia   Chronic anticoagulation - Coumadin, CHADS2VASC=4   GERD (gastroesophageal reflux disease)   Chronic diastolic (congestive) heart failure (HCC)   Steroid-induced hyperglycemia      Past Medical History  Diagnosis Date  . Acute respiratory failure (HCC)   . Community acquired pneumonia   . COPD with acute exacerbation (HCC)   . Anemia   . Hypokalemia   . Hyponatremia   . CHF (congestive heart failure) (HCC)   . Hypertension   . Shortness of breath dyspnea   . History of kidney stones     Past Surgical History  Procedure Laterality Date  . Back surgery    . Cyst excision      base of tongue  . Cholecystectomy    . Abdominal hysterectomy         HPI  from the history and physical done on the day of admission:    74 year old female with COPD with chronic hypoxic respiratory failure on 4 L O2 via nasal cannula, chronic A. fib on Coumadin, chronic diastolic CHF, and history of DVT who was discharged from the hospital on 12/20 after being admitted for acute COPD exacerbation  with respiratory failure and was initially doing well at home until 12/22 when she had acute worsening of her breathing and cough, and presented backt to the ED.   Patient was tachycardic in the ED with ABG showing hypercapnic respiratory failure. Required BiPAP and was given IV Solu-Medrol, nebs and Levaquin. Admitted to stepdown unit     Hospital Course:   Acute on chronic hypoxic and hypercapnic respiratory failure secondary to COPD exacerbation Slowly improving - cont usual medical tx - begin very slow/prolonged steroid taper - his baseline advanced COPD with 4 L nasal cannula oxygen and told times, she says that she has never seen a pulmonary physician, she is also smoking, counseled her to quit smoking. We'll have her follow with pulmonary outpatient.   Today she feels much better, she is not short of breath and is on her baseline 4 L nasal cannula oxygen, discharge in the morning with home PT. will be placed on prolonged prednisone taper with outpatient pulmonary and PCP follow-up. Home PT ordered.    Chronic diastolic CHF EF on recent echo a  few months ago was 55% No exam findings to suggest signif volume overload - she is at her apparent baseline wgt of ~60kg ,  2 new home Lasix, written instructions provided on fluid and salt restriction.   Chronic atrial fib  was on Coumadin , [place] vascular 2 score greater than 4   goal heart rate control, continue home dose Cardizem, her INR was erratic and rarely in the therapeutic range, have switched her to Eliquis, 1 month supply provided through Eliquis card.   Hx of DVT  she was on Coumadin with inconsistent INR has been switched to Eliquis   DM type II. A1c was 7, home regimen upon discharge.    Discharge Condition: Stable  Follow UP  Follow-up Information    Follow up with [name],[name], MD. Schedule an appointment as soon as possible for a visit in 1 week.   Specialty:  Family Medicine      [facility]  [facility]. Schedule an appointment as soon as possible for a visit in 1 week.   Specialty:  Pulmonary Disease   Why:  COPD   Contact information:   [address] [place] [address] [phone]        Consults obtained - None  Diet and Activity recommendation: See Discharge Instructions below  Discharge Instructions       Discharge Instructions    Diet - low sodium heart healthy    Complete by:  As directed      Discharge instructions    Complete by:  As directed   Follow with Primary MD [name],[name], MD in 7 days   Get CBC, CMP, 2 view Chest X ray checked  by Primary MD next visit.    Activity: As tolerated with Full fall precautions use walker/cane & assistance as needed   Disposition Home     Diet:   Heart Healthy - Check your Weight same time everyday, if you gain over 2 pounds, or you develop in leg swelling, experience more shortness of breath or chest pain, call your Primary MD immediately. Follow Cardiac Low Salt Diet and 1.5 lit/day fluid restriction.   On your next visit with your primary care physician please Get Medicines reviewed and adjusted.   Please request your Prim.MD to go over all Hospital Tests and Procedure/Radiological results at the follow up, please get all Hospital records sent to your Prim MD by signing hospital release before you go home.   If you experience worsening of your admission symptoms, develop shortness of breath, life threatening emergency, suicidal or homicidal thoughts you must seek medical attention immediately by calling 911 or calling your MD immediately  if symptoms less severe.  You Must read complete instructions/literature along with all the possible adverse reactions/side effects for all the Medicines you take and that have been prescribed to you. Take any new Medicines after you have completely understood and accpet all the possible adverse reactions/side effects.   Do not drive, operating heavy  machinery, perform activities at heights, swimming or participation in water activities or provide baby sitting services if your were admitted for syncope or siezures until you have seen by Primary MD or a Neurologist and advised to do so again.  Do not drive when taking Pain medications.    Do not take more than prescribed Pain, Sleep and Anxiety Medications  Special Instructions: If you have smoked or chewed Tobacco  in the last 2 yrs please stop smoking, stop any regular Alcohol  and or  any Recreational drug use.  Wear Seat belts while driving.   Please note  You were cared for by a hospitalist during your hospital stay. If you have any questions about your discharge medications or the care you received while you were in the hospital after you are discharged, you can call the unit and asked to speak with the hospitalist on call if the hospitalist that took care of you is not available. Once you are discharged, your primary care physician will handle any further medical issues. Please note that NO REFILLS for any discharge medications will be authorized once you are discharged, as it is imperative that you return to your primary care physician (or establish a relationship with a primary care physician if you do not have one) for your aftercare needs so that they can reassess your need for medications and monitor your lab values.     Increase activity slowly    Complete by:  As directed              Discharge Medications       Medication List    STOP taking these medications        levofloxacin 500 MG tablet  Commonly known as:  LEVAQUIN     predniSONE 10 MG tablet  Commonly known as:  DELTASONE  Replaced by:  predniSONE 5 MG tablet     warfarin 3 MG tablet  Commonly known as:  COUMADIN      TAKE these medications        albuterol (2.5 MG/3ML) 0.083% nebulizer solution  Commonly known as:  PROVENTIL  Take 3 mLs (2.5 mg total) by nebulization every 2 (two) hours as  needed for wheezing or shortness of breath.     apixaban 5 MG Tabs tablet  Commonly known as:  ELIQUIS  Take 1 tablet (5 mg total) by mouth 2 (two) times daily.     arformoterol 15 MCG/2ML Nebu  Commonly known as:  BROVANA  Take 2 mLs (15 mcg total) by nebulization 2 (two) times daily.     budesonide 0.25 MG/2ML nebulizer solution  Commonly known as:  PULMICORT  Take 2 mLs (0.25 mg total) by nebulization 2 (two) times daily.     diltiazem 180 MG 24 hr capsule  Commonly known as:  CARDIZEM CD  Take 1 capsule (180 mg total) by mouth daily.     furosemide 40 MG tablet  Commonly known as:  LASIX  Take 40 mg by mouth daily.     guaiFENesin 600 MG 12 hr tablet  Commonly known as:  MUCINEX  Take 2 tablets (1,200 mg total) by mouth 2 (two) times daily.     ipratropium-albuterol 0.5-2.5 (3) MG/3ML Soln  Commonly known as:  DUONEB  Take 3 mLs by nebulization 4 (four) times daily.     pantoprazole 40 MG tablet  Commonly known as:  PROTONIX  Take 40 mg by mouth daily.     predniSONE 5 MG tablet  Commonly known as:  DELTASONE  Label  & dispense according to the schedule below. 10 Pills PO for 3 days then, 8 Pills PO for 3 days, 6 Pills PO for 3 days, 4 Pills PO for 3 days, 2 Pills PO for 3 days, 1 Pills PO for 3 days, 1/2 Pill  PO for 3 days then STOP. Total 95 pills.     SPIRIVA HANDIHALER 18 MCG inhalation capsule  Generic drug:  tiotropium  Place 18 mcg into inhaler and inhale daily.  Major procedures and Radiology Reports - PLEASE review detailed and final reports for all details, in brief -       Ct Abdomen Pelvis W Contrast  09/03/2015  CLINICAL DATA:  Distended abdomen.  Hematuria. EXAM: CT ABDOMEN AND PELVIS WITH CONTRAST TECHNIQUE: Multidetector CT imaging of the abdomen and pelvis was performed using the standard protocol following bolus administration of intravenous contrast. CONTRAST:  OMNIPAQUE IOHEXOL 300 MG/ML  SOLN COMPARISON:  07/08/2015 FINDINGS:  Lower chest: Patchy airspace densities are noted in the lung bases bilaterally. No pleural fluid. Hepatobiliary: Again noted are multiple liver cysts. Previous cholecystectomy. No biliary dilatation. Pancreas: The pancreas is negative. Spleen: Normal appearance of the spleen. Adrenals/Urinary Tract: The adrenal glands are normal. Indeterminate intermediate attenuating lesion arising from the inferior pole of the left kidney is again noted measuring 2.2 cm, image 38 of series 2. No kidney stones or obstructive uropathy identified. Bilateral renal vascular calcifications are noted. Urinary bladder is collapsed around a Foley catheter. No bladder calculi noted. Stomach/Bowel: Moderate distension of the stomach. The small bowel loops have a normal course and caliber without obstruction. Moderate stool burden identified throughout the colon. No pathologic dilatation of the large bowel loops identified. Vascular/Lymphatic: Calcified atherosclerotic disease involves the abdominal aorta. The abdominal aorta has a maximum AP dimension of 2.7 cm. No enlarged retroperitoneal or mesenteric adenopathy. No enlarged pelvic or inguinal lymph nodes. Reproductive: Previous hysterectomy.  No adnexal mass noted. Other: No free fluid or fluid collections identified. Musculoskeletal: Degenerative disc disease is noted within the lumbar spine. The bones appear osteopenic. No fractures identified. IMPRESSION: 1. Moderate distension of the gastric lumen. No pathologic dilatation of the small or large bowel loops noted. 2. No findings to explain patient's hematuria. 3. Aortic atherosclerosis. Ectatic abdominal aorta at risk for aneurysm development. Recommend followup by ultrasound in 5 years. This recommendation follows ACR consensus guidelines: White Paper of the ACR Incidental Findings Committee II on Vascular Findings. J Am Coll Radiol 2013; 10:789-794. 4. Indeterminate intermediate attenuating lesion arises from the inferior pole of  left kidney. This may represent a hemorrhagic or proteinaceous cyst. A small enhancing renal lesion cannot be excluded. Recommend followup imaging with non-emergent renal protocol MRI or CT. 5. Mild bilateral lower lobe airspace disease. Electronically Signed   By: Signa Kell M.D.   On: 09/03/2015 18:36   Dg Chest Port 1 View  09/13/2015  CLINICAL DATA:  COPD.  Follow-up shortness of breath and cough. EXAM: PORTABLE CHEST 1 VIEW COMPARISON:  September 09, 2015 FINDINGS: The heart size and mediastinal contours are stable. The heart size is enlarged. There is a small left pleural effusion. There is no focal pneumonia or pulmonary edema. The lungs are hyperinflated. The visualized skeletal structures are stable. IMPRESSION: No focal pneumonia.  Small left pleural effusion.  Emphysema. Electronically Signed   By: Sherian Rein M.D.   On: 09/13/2015 08:27   Dg Chest Port 1 View  09/09/2015  CLINICAL DATA:  Shortness of breath today. Patient discharge from the hospital 09/07/2015. EXAM: PORTABLE CHEST 1 VIEW COMPARISON:  CT chest 07/22/2015. Single view of the chest 09/03/2015. FINDINGS: The lungs are emphysematous but clear. Heart size is enlarged. No pneumothorax or pleural effusion. Atherosclerosis noted. IMPRESSION: Emphysema without acute disease. Cardiomegaly. Atherosclerosis. Electronically Signed   By: Drusilla Kanner M.D.   On: 09/09/2015 14:15   Dg Chest Port 1 View  09/03/2015  CLINICAL DATA:  Increasing shortness of breath EXAM: PORTABLE CHEST 1 VIEW COMPARISON:  09/02/2015 FINDINGS: Moderate cardiac enlargement. Aortic atherosclerosis. Mild interstitial edema is identified. No airspace consolidation. IMPRESSION: 1. Cardiac enlargement and mild edema. Electronically Signed   By: Signa Kell M.D.   On: 09/03/2015 19:46   Dg Chest Port 1 View  09/02/2015  CLINICAL DATA:  Shortness of breath today.  History of COPD. EXAM: PORTABLE CHEST 1 VIEW COMPARISON:  CT chest 07/22/2015.  Chest  07/21/2015. FINDINGS: Mild cardiac enlargement. Pulmonary vascularity is normal. Emphysematous changes in the lungs. Atelectasis in the lung bases. Fluid or thickened pleura in the left costophrenic angle. No focal consolidation. No pneumothorax. Mediastinal contours appear intact. Calcified aorta. IMPRESSION: Cardiac enlargement. Emphysematous changes in the lungs. Small pleural effusion or pleural thickening on the left. No focal airspace disease or consolidation in the lungs. Electronically Signed   By: Burman Nieves M.D.   On: 09/02/2015 01:17    Micro Results      No results found for this or any previous visit (from the past 240 hour(s)).  Today   Subjective    Brenda Wells today has no headache,no chest abdominal pain,no new weakness tingling or numbness, feels much better wants to go home today.     Objective   Blood pressure 109/72, pulse 103, temperature 98.3 F (36.8 C), temperature source Oral, resp. rate 20, height 5\' 5"  (1.651 m), weight 65.2 kg (143 lb 11.8 oz), SpO2 98 %.   Intake/Output Summary (Last 24 hours) at 09/14/15 1127 Last data filed at 09/14/15 9811  Gross per 24 hour  Intake    440 ml  Output   2950 ml  Net  -2510 ml    Exam Awake Alert, Oriented x 3, No new F.N deficits, Normal affect [place].AT,PERRAL Supple Neck,No JVD, No cervical lymphadenopathy appriciated.  Symmetrical Chest wall movement, moderate air movement bilaterally, coarse bilateral breath sounds with minimal wheezing RRR,No Gallops,Rubs or new Murmurs, No Parasternal Heave +ve B.Sounds, Abd Soft, Non tender, No organomegaly appriciated, No rebound -guarding or rigidity. No Cyanosis, Clubbing or edema, No new Rash or bruise   Data Review   CBC w Diff: Lab Results  Component Value Date   WBC 19.0* 09/13/2015   HGB 9.8* 09/13/2015   HCT 32.9* 09/13/2015   PLT 341 09/13/2015   LYMPHOPCT 4 09/09/2015   MONOPCT 4 09/09/2015   EOSPCT 0 09/09/2015   BASOPCT 0 09/09/2015    CMP:  Lab Results  Component Value Date   NA 137 09/12/2015   K 4.6 09/12/2015   CL 98* 09/12/2015   CO2 29 09/12/2015   BUN 24* 09/12/2015   CREATININE 0.71 09/12/2015   PROT 4.9* 09/11/2015   ALBUMIN 2.5* 09/11/2015   BILITOT 0.2* 09/11/2015   ALKPHOS 45 09/11/2015   AST 16 09/11/2015   ALT 17 09/11/2015  .   Total Time in preparing paper work, data evaluation and todays exam - 35 minutes  Leroy Sea M.D on 09/14/2015 at 11:27 AM  [facility]   Office  [phone]

## 2015-09-19 DIAGNOSIS — J449 Chronic obstructive pulmonary disease, unspecified: Secondary | ICD-10-CM | POA: Diagnosis not present

## 2015-09-23 DIAGNOSIS — Z7901 Long term (current) use of anticoagulants: Secondary | ICD-10-CM | POA: Diagnosis not present

## 2015-09-23 DIAGNOSIS — M6281 Muscle weakness (generalized): Secondary | ICD-10-CM | POA: Diagnosis not present

## 2015-09-23 DIAGNOSIS — Z7952 Long term (current) use of systemic steroids: Secondary | ICD-10-CM | POA: Diagnosis not present

## 2015-09-23 DIAGNOSIS — I482 Chronic atrial fibrillation: Secondary | ICD-10-CM | POA: Diagnosis not present

## 2015-09-23 DIAGNOSIS — E119 Type 2 diabetes mellitus without complications: Secondary | ICD-10-CM | POA: Diagnosis not present

## 2015-09-23 DIAGNOSIS — J9610 Chronic respiratory failure, unspecified whether with hypoxia or hypercapnia: Secondary | ICD-10-CM | POA: Diagnosis not present

## 2015-09-23 DIAGNOSIS — R2689 Other abnormalities of gait and mobility: Secondary | ICD-10-CM | POA: Diagnosis not present

## 2015-09-23 DIAGNOSIS — I5032 Chronic diastolic (congestive) heart failure: Secondary | ICD-10-CM | POA: Diagnosis not present

## 2015-09-23 DIAGNOSIS — J441 Chronic obstructive pulmonary disease with (acute) exacerbation: Secondary | ICD-10-CM | POA: Diagnosis not present

## 2015-09-23 DIAGNOSIS — I110 Hypertensive heart disease with heart failure: Secondary | ICD-10-CM | POA: Diagnosis not present

## 2015-09-28 ENCOUNTER — Inpatient Hospital Stay (HOSPITAL_COMMUNITY)
Admission: EM | Admit: 2015-09-28 | Discharge: 2015-10-04 | DRG: 190 | Disposition: A | Discharge status: Home Health | Payer: Medicare Other | Attending: Internal Medicine | Admitting: Internal Medicine

## 2015-09-28 ENCOUNTER — Emergency Department (HOSPITAL_COMMUNITY): Payer: Medicare Other

## 2015-09-28 ENCOUNTER — Encounter (HOSPITAL_COMMUNITY): Payer: Self-pay | Admitting: Emergency Medicine

## 2015-09-28 DIAGNOSIS — F17210 Nicotine dependence, cigarettes, uncomplicated: Secondary | ICD-10-CM | POA: Diagnosis present

## 2015-09-28 DIAGNOSIS — Z515 Encounter for palliative care: Secondary | ICD-10-CM

## 2015-09-28 DIAGNOSIS — Z9981 Dependence on supplemental oxygen: Secondary | ICD-10-CM

## 2015-09-28 DIAGNOSIS — I482 Chronic atrial fibrillation: Secondary | ICD-10-CM | POA: Diagnosis present

## 2015-09-28 DIAGNOSIS — Z833 Family history of diabetes mellitus: Secondary | ICD-10-CM

## 2015-09-28 DIAGNOSIS — R0602 Shortness of breath: Secondary | ICD-10-CM | POA: Diagnosis not present

## 2015-09-28 DIAGNOSIS — Z87442 Personal history of urinary calculi: Secondary | ICD-10-CM

## 2015-09-28 DIAGNOSIS — J441 Chronic obstructive pulmonary disease with (acute) exacerbation: Principal | ICD-10-CM | POA: Diagnosis present

## 2015-09-28 DIAGNOSIS — I110 Hypertensive heart disease with heart failure: Secondary | ICD-10-CM | POA: Diagnosis not present

## 2015-09-28 DIAGNOSIS — I4891 Unspecified atrial fibrillation: Secondary | ICD-10-CM | POA: Diagnosis not present

## 2015-09-28 DIAGNOSIS — I5032 Chronic diastolic (congestive) heart failure: Secondary | ICD-10-CM | POA: Diagnosis not present

## 2015-09-28 DIAGNOSIS — R069 Unspecified abnormalities of breathing: Secondary | ICD-10-CM | POA: Diagnosis not present

## 2015-09-28 DIAGNOSIS — Z86718 Personal history of other venous thrombosis and embolism: Secondary | ICD-10-CM | POA: Diagnosis not present

## 2015-09-28 DIAGNOSIS — Z79899 Other long term (current) drug therapy: Secondary | ICD-10-CM

## 2015-09-28 DIAGNOSIS — Z7901 Long term (current) use of anticoagulants: Secondary | ICD-10-CM | POA: Diagnosis not present

## 2015-09-28 DIAGNOSIS — I248 Other forms of acute ischemic heart disease: Secondary | ICD-10-CM | POA: Diagnosis not present

## 2015-09-28 DIAGNOSIS — Z7951 Long term (current) use of inhaled steroids: Secondary | ICD-10-CM

## 2015-09-28 DIAGNOSIS — E538 Deficiency of other specified B group vitamins: Secondary | ICD-10-CM | POA: Diagnosis present

## 2015-09-28 DIAGNOSIS — Z7952 Long term (current) use of systemic steroids: Secondary | ICD-10-CM | POA: Diagnosis not present

## 2015-09-28 DIAGNOSIS — D649 Anemia, unspecified: Secondary | ICD-10-CM | POA: Diagnosis not present

## 2015-09-28 DIAGNOSIS — R0600 Dyspnea, unspecified: Secondary | ICD-10-CM | POA: Diagnosis not present

## 2015-09-28 DIAGNOSIS — Z8249 Family history of ischemic heart disease and other diseases of the circulatory system: Secondary | ICD-10-CM

## 2015-09-28 DIAGNOSIS — J9601 Acute respiratory failure with hypoxia: Secondary | ICD-10-CM | POA: Diagnosis not present

## 2015-09-28 DIAGNOSIS — I5033 Acute on chronic diastolic (congestive) heart failure: Secondary | ICD-10-CM | POA: Diagnosis not present

## 2015-09-28 DIAGNOSIS — K5900 Constipation, unspecified: Secondary | ICD-10-CM | POA: Diagnosis not present

## 2015-09-28 DIAGNOSIS — J9600 Acute respiratory failure, unspecified whether with hypoxia or hypercapnia: Secondary | ICD-10-CM | POA: Diagnosis present

## 2015-09-28 DIAGNOSIS — Z66 Do not resuscitate: Secondary | ICD-10-CM | POA: Diagnosis present

## 2015-09-28 DIAGNOSIS — R5381 Other malaise: Secondary | ICD-10-CM | POA: Diagnosis not present

## 2015-09-28 DIAGNOSIS — J9621 Acute and chronic respiratory failure with hypoxia: Secondary | ICD-10-CM | POA: Diagnosis present

## 2015-09-28 LAB — COMPREHENSIVE METABOLIC PANEL
ALK PHOS: 69 U/L (ref 38–126)
ALT: 13 U/L — ABNORMAL LOW (ref 14–54)
ANION GAP: 13 (ref 5–15)
AST: 18 U/L (ref 15–41)
Albumin: 2.7 g/dL — ABNORMAL LOW (ref 3.5–5.0)
BUN: 18 mg/dL (ref 6–20)
CALCIUM: 9 mg/dL (ref 8.9–10.3)
CO2: 33 mmol/L — AB (ref 22–32)
Chloride: 95 mmol/L — ABNORMAL LOW (ref 101–111)
Creatinine, Ser: 0.83 mg/dL (ref 0.44–1.00)
GFR calc non Af Amer: >60 mL/min (ref >60)
Glucose, Bld: 161 mg/dL — ABNORMAL HIGH (ref 65–99)
POTASSIUM: 3.8 mmol/L (ref 3.5–5.1)
SODIUM: 141 mmol/L (ref 135–145)
Total Bilirubin: 0.1 mg/dL — ABNORMAL LOW (ref 0.3–1.2)
Total Protein: 6 g/dL — ABNORMAL LOW (ref 6.5–8.1)

## 2015-09-28 LAB — I-STAT TROPONIN, ED: TROPONIN I, POC: 0.03 ng/mL (ref 0.00–0.08)

## 2015-09-28 LAB — CBC WITH DIFFERENTIAL/PLATELET
BASOS ABS: 0 10*3/uL (ref 0.0–0.1)
Basophils Relative: 0 %
EOS ABS: 0 10*3/uL (ref 0.0–0.7)
EOS PCT: 0 %
HCT: 31.1 % — ABNORMAL LOW (ref 36.0–46.0)
Hemoglobin: 9.1 g/dL — ABNORMAL LOW (ref 12.0–15.0)
LYMPHS PCT: 20 %
Lymphs Abs: 1.8 10*3/uL (ref 0.7–4.0)
MCH: 23.8 pg — ABNORMAL LOW (ref 26.0–34.0)
MCHC: 29.3 g/dL — ABNORMAL LOW (ref 30.0–36.0)
MCV: 81.2 fL (ref 78.0–100.0)
Monocytes Absolute: 0.6 10*3/uL (ref 0.1–1.0)
Monocytes Relative: 7 %
Neutro Abs: 6.7 10*3/uL (ref 1.7–7.7)
Neutrophils Relative %: 73 %
PLATELETS: 452 10*3/uL — AB (ref 150–400)
RBC: 3.83 MIL/uL — AB (ref 3.87–5.11)
RDW: 19.6 % — ABNORMAL HIGH (ref 11.5–15.5)
WBC: 9.2 10*3/uL (ref 4.0–10.5)

## 2015-09-28 LAB — URINALYSIS, ROUTINE W REFLEX MICROSCOPIC
Bilirubin Urine: NEGATIVE
Glucose, UA: NEGATIVE mg/dL
Hgb urine dipstick: NEGATIVE
Ketones, ur: NEGATIVE mg/dL
LEUKOCYTES UA: NEGATIVE
Nitrite: NEGATIVE
PROTEIN: NEGATIVE mg/dL
Specific Gravity, Urine: 1.013 (ref 1.005–1.030)
pH: 5.5 (ref 5.0–8.0)

## 2015-09-28 LAB — I-STAT ARTERIAL BLOOD GAS, ED
ACID-BASE EXCESS: 9 mmol/L — AB (ref 0.0–2.0)
Bicarbonate: 34.2 mEq/L — ABNORMAL HIGH (ref 20.0–24.0)
O2 Saturation: 97 %
PH ART: 7.443 (ref 7.350–7.450)
TCO2: 36 mmol/L (ref 0–100)
pCO2 arterial: 50 mmHg — ABNORMAL HIGH (ref 35.0–45.0)
pO2, Arterial: 91 mmHg (ref 80.0–100.0)

## 2015-09-28 LAB — I-STAT CG4 LACTIC ACID, ED
LACTIC ACID, VENOUS: 1.95 mmol/L (ref 0.5–2.0)
Lactic Acid, Venous: 3.96 mmol/L (ref 0.5–2.0)

## 2015-09-28 MED ORDER — ALBUTEROL SULFATE (2.5 MG/3ML) 0.083% IN NEBU
5.0000 mg | INHALATION_SOLUTION | Freq: Once | RESPIRATORY_TRACT | Status: DC
  Filled 2015-09-28: qty 6

## 2015-09-28 MED ORDER — SODIUM CHLORIDE 0.9 % IV SOLN: INTRAVENOUS | Status: DC

## 2015-09-28 MED ORDER — SODIUM CHLORIDE 0.9 % IV BOLUS (SEPSIS): 2000.0000 mL | Freq: Once | INTRAVENOUS | Status: DC

## 2015-09-28 MED ORDER — METHYLPREDNISOLONE SODIUM SUCC 125 MG IJ SOLR
125.0000 mg | Freq: Once | INTRAMUSCULAR | Status: AC
  Administered 2015-09-28: 125 mg via INTRAVENOUS
  Filled 2015-09-28: qty 2

## 2015-09-28 MED ORDER — SODIUM CHLORIDE 0.9 % IV BOLUS (SEPSIS)
500.0000 mL | Freq: Once | INTRAVENOUS | Status: AC
  Administered 2015-09-29: 500 mL via INTRAVENOUS

## 2015-09-28 MED ORDER — ALBUTEROL (5 MG/ML) CONTINUOUS INHALATION SOLN
10.0000 mg/h | INHALATION_SOLUTION | Freq: Once | RESPIRATORY_TRACT | Status: AC
  Administered 2015-09-28: 10 mg/h via RESPIRATORY_TRACT
  Filled 2015-09-28: qty 20

## 2015-09-28 NOTE — ED Provider Notes (Signed)
CSN: [id number]     Arrival date & time 09/28/15  1859 History   First MD Initiated Contact with Patient 09/28/15 1929     Chief Complaint  Patient presents with  . Shortness of Breath     (Consider location/radiation/quality/duration/timing/severity/associated sxs/prior Treatment) HPI  Brenda Wells is a 75 y.o. female who presents for evaluation of weakness, shortness of breath and hypoxia. She states that she was well until about 4 PM, start to feel weak, then checked her oxygen saturation and found to be low. She states at that point, she collapsed onto the bed, but did not pass out. Her oxygen saturation measurement, pulse ox, at home, was in the low 80s. EMS arrived and found her to have a saturation of 89% on 4 L nasal cannula. During transport. They treated her with albuterol, Septra half milligrams and oxygen. She reports ongoing cough and shortness of breath and continues to smoke cigarettes. She was hospitalized about 2 weeks ago for a COPD exacerbation. She was not diagnosed with pneumonia at that time. On presentation here, EMS informed the nursing staff, that the patient had had a pneumonia recently. The patient denies being on antibiotics at this time. There are no other known modifying factors.    Past Medical History  Diagnosis Date  . Acute respiratory failure (HCC)   . Community acquired pneumonia   . COPD with acute exacerbation (HCC)   . Anemia   . Hypokalemia   . Hyponatremia   . CHF (congestive heart failure) (HCC)   . Hypertension   . Shortness of breath dyspnea   . History of kidney stones    Past Surgical History  Procedure Laterality Date  . Back surgery    . Cyst excision      base of tongue  . Cholecystectomy    . Abdominal hysterectomy     Family History  Problem Relation Age of Onset  . Diabetes Mellitus II Mother   . CAD Mother   . CAD Brother    Social History  Substance Use Topics  . Smoking status: Current Every Day Smoker -- 0.10  packs/day for 60 years    Types: Cigarettes  . Smokeless tobacco: Never Used     Comment: I smoke 1 cigarette a day "  . Alcohol Use: No   OB History    No data available     Review of Systems  All other systems reviewed and are negative.     Allergies  Review of patient's allergies indicates no known allergies.  Home Medications   Prior to Admission medications   Medication Sig Start Date End Date Taking? Authorizing Provider  albuterol (PROVENTIL) (2.5 MG/3ML) 0.083% nebulizer solution Take 3 mLs (2.5 mg total) by nebulization every 2 (two) hours as needed for wheezing or shortness of breath. 09/07/15  Yes Richarda Overlie, MD  arformoterol (BROVANA) 15 MCG/2ML NEBU Take 2 mLs (15 mcg total) by nebulization 2 (two) times daily. 09/07/15  Yes Richarda Overlie, MD  diltiazem (CARDIZEM CD) 180 MG 24 hr capsule Take 1 capsule (180 mg total) by mouth daily. 09/07/15  Yes Richarda Overlie, MD  Fluticasone-Salmeterol (ADVAIR) 500-50 MCG/DOSE AEPB Inhale 1 puff into the lungs 2 (two) times daily.   Yes Historical Provider, MD  furosemide (LASIX) 40 MG tablet Take 40 mg by mouth daily.   Yes Historical Provider, MD  predniSONE (DELTASONE) 10 MG tablet Take 10 mg by mouth daily with breakfast.   Yes Historical Provider, MD  [facility]  HANDIHALER 18 MCG inhalation capsule Place 18 mcg into inhaler and inhale daily.  08/24/15  Yes Historical Provider, MD  warfarin (COUMADIN) 5 MG tablet Take 2.5 mg by mouth daily.   Yes Historical Provider, MD  apixaban (ELIQUIS) 5 MG TABS tablet Take 1 tablet (5 mg total) by mouth 2 (two) times daily. Patient not taking: Reported on 09/28/2015 09/14/15   Leroy SeaPrashant K Singh, MD  budesonide (PULMICORT) 0.25 MG/2ML nebulizer solution Take 2 mLs (0.25 mg total) by nebulization 2 (two) times daily. Patient not taking: Reported on 09/28/2015 09/07/15   Richarda OverlieNayana Abrol, MD  guaiFENesin (MUCINEX) 600 MG 12 hr tablet Take 2 tablets (1,200 mg total) by mouth 2 (two) times daily. Patient  not taking: Reported on 09/28/2015 09/07/15   Richarda OverlieNayana Abrol, MD  predniSONE (DELTASONE) 5 MG tablet Label  & dispense according to the schedule below. 10 Pills PO for 3 days then, 8 Pills PO for 3 days, 6 Pills PO for 3 days, 4 Pills PO for 3 days, 2 Pills PO for 3 days, 1 Pills PO for 3 days, 1/2 Pill  PO for 3 days then STOP. Total 95 pills. Patient not taking: Reported on 09/28/2015 09/14/15   Leroy SeaPrashant K Singh, MD   BP 112/78 mmHg  Pulse 103  Temp(Src) 99.5 F (37.5 C) (Rectal)  Resp 26  Ht 5\' 5"  (1.651 m)  Wt 134 lb (60.782 kg)  BMI 22.30 kg/m2  SpO2 99% Physical Exam  Constitutional: She is oriented to person, place, and time. She appears well-developed. No distress.  Appears older than stated age  HENT:  Head: Normocephalic and atraumatic.  Right Ear: External ear normal.  Left Ear: External ear normal.  Eyes: Conjunctivae and EOM are normal. Pupils are equal, round, and reactive to light.  Neck: Normal range of motion and phonation normal. Neck supple.  Cardiovascular: Normal rate, regular rhythm and normal heart sounds.   Pulmonary/Chest: Effort normal. No respiratory distress. She has wheezes. She exhibits no bony tenderness.  Decreased air movement bilaterally. No increased work of breathing.  Abdominal: Soft. There is no tenderness.  Musculoskeletal: Normal range of motion. She exhibits no edema or tenderness.  Neurological: She is alert and oriented to person, place, and time. No cranial nerve deficit or sensory deficit. She exhibits normal muscle tone. Coordination normal.  Skin: Skin is warm, dry and intact.  Psychiatric: She has a normal mood and affect. Her behavior is normal. Judgment and thought content normal.  Nursing note and vitals reviewed.   ED Course  Procedures (including critical care time)  19:50- patient does not have clinical evidence for acute infection. Therefore, I do not believe that she is a candidate for urgent treatment with high-dose IV fluids,  or that she will need antibiotics started emergently.    Medications  albuterol (PROVENTIL) (2.5 MG/3ML) 0.083% nebulizer solution 5 mg (5 mg Nebulization Not Given 09/28/15 2150)  sodium chloride 0.9 % bolus 500 mL (not administered)  0.9 %  sodium chloride infusion (not administered)  albuterol (PROVENTIL,VENTOLIN) solution continuous neb (10 mg/hr Nebulization Given 09/28/15 2152)  methylPREDNISolone sodium succinate (SOLU-MEDROL) 125 mg/2 mL injection 125 mg (125 mg Intravenous Given 09/28/15 2148)    Patient Vitals for the past 24 hrs:  BP Temp Temp src Pulse Resp SpO2 Height Weight  09/28/15 2215 112/78 mmHg - - 103 26 99 % - -  09/28/15 2152 - - - - - 100 % - -  09/28/15 2130 117/60 mmHg - - 100 (!) 32  97 % - -  09/28/15 2045 108/81 mmHg - - 105 (!) 31 100 % - -  09/28/15 2000 115/68 mmHg - - (!) 169 (!) 36 96 % - -  09/28/15 1948 - 99.5 F (37.5 C) Rectal - - - - -  09/28/15 1930 121/67 mmHg - - 115 24 98 % - -  09/28/15 1913 - - - - - 94 % - -  09/28/15 1911 132/75 mmHg 98.2 F (36.8 C) Oral 108 (!) 33 96 % 5\' 5"  (1.651 m) 134 lb (60.782 kg)  09/28/15 1859 - - - - - (!) 89 % - -    9:38 PM Reevaluation with update and discussion. After initial assessment and treatment, an updated evaluation reveals she is alert and comfortable, supine, without respiratory distress. Lungs continue to have poor air movement bilaterally with scattered expiratory wheezing. Issue. Medications ordered, and will arrange for admission.Flint Melter   2158 PM-Consult complete with Hospitalist. Patient case explained and discussed. He agrees to admit patient for further evaluation and treatment. Call ended at 2210  CRITICAL CARE Performed by: Flint Melter Total critical care time: 45 minutes Critical care time was exclusive of separately billable procedures and treating other patients. Critical care was necessary to treat or prevent imminent or life-threatening deterioration. Critical care was  time spent personally by me on the following activities: development of treatment plan with patient and/or surrogate as well as nursing, discussions with consultants, evaluation of patient's response to treatment, examination of patient, obtaining history from patient or surrogate, ordering and performing treatments and interventions, ordering and review of laboratory studies, ordering and review of radiographic studies, pulse oximetry and re-evaluation of patient's condition.   Labs Review Labs Reviewed  COMPREHENSIVE METABOLIC PANEL - Abnormal; Notable for the following:    Chloride 95 (*)    CO2 33 (*)    Glucose, Bld 161 (*)    Total Protein 6.0 (*)    Albumin 2.7 (*)    ALT 13 (*)    Total Bilirubin 0.1 (*)    All other components within normal limits  CBC WITH DIFFERENTIAL/PLATELET - Abnormal; Notable for the following:    RBC 3.83 (*)    Hemoglobin 9.1 (*)    HCT 31.1 (*)    MCH 23.8 (*)    MCHC 29.3 (*)    RDW 19.6 (*)    Platelets 452 (*)    All other components within normal limits  I-STAT CG4 LACTIC ACID, ED - Abnormal; Notable for the following:    Lactic Acid, Venous 3.96 (*)    All other components within normal limits  I-STAT ARTERIAL BLOOD GAS, ED - Abnormal; Notable for the following:    pCO2 arterial 50.0 (*)    Bicarbonate 34.2 (*)    Acid-Base Excess 9.0 (*)    All other components within normal limits  CULTURE, BLOOD (ROUTINE X 2)  CULTURE, BLOOD (ROUTINE X 2)  URINE CULTURE  URINALYSIS, ROUTINE W REFLEX MICROSCOPIC (NOT AT [facility])  I-STAT TROPOININ, ED  I-STAT CG4 LACTIC ACID, ED    Imaging Review Dg Chest 2 View  09/28/2015  CLINICAL DATA:  Shortness of breath, weakness and history of COPD. EXAM: CHEST - 2 VIEW COMPARISON:  09/13/2015 FINDINGS: Stable advanced emphysematous lung disease with moderate bilateral hyperinflation. There is no evidence of pulmonary edema, consolidation, pneumothorax or nodule. Tiny left pleural effusion present. Stable  top-normal heart size. IMPRESSION: Stable advanced emphysema with hyperinflation. Small left pleural effusion without overt pulmonary  edema or focal infiltrate. Electronically Signed   By: Irish Lack M.D.   On: 09/28/2015 20:39   I have personally reviewed and evaluated these images and lab results as part of my medical decision-making.   EKG Interpretation   Date/Time:  Tuesday September 28 2015 19:02:55 EST Ventricular Rate:  140 PR Interval:  143 QRS Duration: 82 QT Interval:  299 QTC Calculation: 456 R Axis:   58 Text Interpretation:  Sinus tachycardia with irregular rate Low voltage,  extremity leads SINCE LAST TRACING HEART RATE HAS INCREASED Confirmed by  Effie Shy  MD, Mechele Collin ([id number]) on 09/28/2015 9:40:53 PM      MDM   Final diagnoses:  COPD exacerbation (HCC)    Evaluation is consistent with recurrent COPD exacerbation. ABG consistent with compensated chronic respiratory failure. Lactate elevation is nonspecific. No evidence for severe sepsis, metabolic instability or suggestion for impending vascular collapse.  Nursing Notes Reviewed/ Care Coordinated Applicable Imaging Reviewed Interpretation of Laboratory Data incorporated into ED treatment   Plan: Admit    Mancel Bale, MD 09/28/15 2308

## 2015-09-28 NOTE — ED Notes (Signed)
Called resp about ABG, will come when pt gets back from xray

## 2015-09-28 NOTE — ED Notes (Signed)
Effie ShyWentz MD does not want fluids ran for elevated lactic, thinks it is resp related

## 2015-09-28 NOTE — ED Notes (Addendum)
Pt arrives from home via [facility] reporting increased SOB with increased work of breathing around 1700.  Pt reports using 4 breathing treatments at home, reports O2 sat on 4L at home 83%.  Pt reports chronic cough noted upon arrival.  EMS reports initial sats on 4L 89%, reports giving:  7.5 mg albuterol 1 mg Atrovent  Accessory muscle use noted, RR 27.  Pt unable to speak in full sentences.

## 2015-09-29 ENCOUNTER — Encounter (HOSPITAL_COMMUNITY): Payer: Self-pay | Admitting: Internal Medicine

## 2015-09-29 ENCOUNTER — Inpatient Hospital Stay (HOSPITAL_COMMUNITY): Payer: Medicare Other

## 2015-09-29 DIAGNOSIS — I5032 Chronic diastolic (congestive) heart failure: Secondary | ICD-10-CM

## 2015-09-29 DIAGNOSIS — I4891 Unspecified atrial fibrillation: Secondary | ICD-10-CM

## 2015-09-29 DIAGNOSIS — J441 Chronic obstructive pulmonary disease with (acute) exacerbation: Principal | ICD-10-CM

## 2015-09-29 DIAGNOSIS — J9600 Acute respiratory failure, unspecified whether with hypoxia or hypercapnia: Secondary | ICD-10-CM | POA: Diagnosis present

## 2015-09-29 LAB — COMPREHENSIVE METABOLIC PANEL
ALBUMIN: 2.4 g/dL — AB (ref 3.5–5.0)
ALK PHOS: 61 U/L (ref 38–126)
ALT: 14 U/L (ref 14–54)
AST: 21 U/L (ref 15–41)
Anion gap: 12 (ref 5–15)
BILIRUBIN TOTAL: 0.4 mg/dL (ref 0.3–1.2)
BUN: 15 mg/dL (ref 6–20)
CALCIUM: 8.5 mg/dL — AB (ref 8.9–10.3)
CO2: 29 mmol/L (ref 22–32)
CREATININE: 0.82 mg/dL (ref 0.44–1.00)
Chloride: 97 mmol/L — ABNORMAL LOW (ref 101–111)
GFR calc Af Amer: >60 mL/min (ref >60)
GFR calc non Af Amer: >60 mL/min (ref >60)
GLUCOSE: 309 mg/dL — AB (ref 65–99)
Potassium: 4 mmol/L (ref 3.5–5.1)
Sodium: 138 mmol/L (ref 135–145)
TOTAL PROTEIN: 5.5 g/dL — AB (ref 6.5–8.1)

## 2015-09-29 LAB — HEPARIN LEVEL (UNFRACTIONATED)

## 2015-09-29 LAB — CBC WITH DIFFERENTIAL/PLATELET
BASOS ABS: 0 10*3/uL (ref 0.0–0.1)
BASOS PCT: 0 %
Eosinophils Absolute: 0 10*3/uL (ref 0.0–0.7)
Eosinophils Relative: 0 %
HEMATOCRIT: 26.1 % — AB (ref 36.0–46.0)
HEMOGLOBIN: 7.8 g/dL — AB (ref 12.0–15.0)
Lymphocytes Relative: 4 %
Lymphs Abs: 0.3 10*3/uL — ABNORMAL LOW (ref 0.7–4.0)
MCH: 24.1 pg — ABNORMAL LOW (ref 26.0–34.0)
MCHC: 29.9 g/dL — AB (ref 30.0–36.0)
MCV: 80.6 fL (ref 78.0–100.0)
MONOS PCT: 0 %
Monocytes Absolute: 0 10*3/uL — ABNORMAL LOW (ref 0.1–1.0)
NEUTROS ABS: 8 10*3/uL — AB (ref 1.7–7.7)
NEUTROS PCT: 96 %
Platelets: 415 10*3/uL — ABNORMAL HIGH (ref 150–400)
RBC: 3.24 MIL/uL — AB (ref 3.87–5.11)
RDW: 19.6 % — ABNORMAL HIGH (ref 11.5–15.5)
WBC: 8.3 10*3/uL (ref 4.0–10.5)

## 2015-09-29 LAB — TROPONIN I
Troponin I: <0.03 ng/mL (ref <0.031)
Troponin I: 0.05 ng/mL — ABNORMAL HIGH (ref <0.031)

## 2015-09-29 LAB — LACTIC ACID, PLASMA
Lactic Acid, Venous: 3.5 mmol/L (ref 0.5–2.0)
Lactic Acid, Venous: 4.1 mmol/L (ref 0.5–2.0)

## 2015-09-29 LAB — TYPE AND SCREEN
ABO/RH(D): A POS
Antibody Screen: NEGATIVE

## 2015-09-29 LAB — PROTIME-INR
INR: 1.2 (ref 0.00–1.49)
Prothrombin Time: 15.4 seconds — ABNORMAL HIGH (ref 11.6–15.2)

## 2015-09-29 LAB — PROCALCITONIN: Procalcitonin: 0.24 ng/mL

## 2015-09-29 MED ORDER — PIPERACILLIN-TAZOBACTAM 3.375 G IVPB
3.3750 g | Freq: Three times a day (TID) | INTRAVENOUS | Status: DC
  Administered 2015-09-29 – 2015-10-04 (×16): 3.375 g via INTRAVENOUS
  Filled 2015-09-29 (×19): qty 50

## 2015-09-29 MED ORDER — ACETAMINOPHEN 650 MG RE SUPP: 650.0000 mg | Freq: Four times a day (QID) | RECTAL | Status: DC | PRN

## 2015-09-29 MED ORDER — WARFARIN - PHARMACIST DOSING INPATIENT: Freq: Every day | Status: DC

## 2015-09-29 MED ORDER — ONDANSETRON HCL 4 MG/2ML IJ SOLN: 4.0000 mg | Freq: Four times a day (QID) | INTRAMUSCULAR | Status: DC | PRN

## 2015-09-29 MED ORDER — LEVOFLOXACIN IN D5W 750 MG/150ML IV SOLN
750.0000 mg | Freq: Every day | INTRAVENOUS | Status: DC
  Administered 2015-09-29: 750 mg via INTRAVENOUS
  Filled 2015-09-29: qty 150

## 2015-09-29 MED ORDER — DILTIAZEM HCL ER COATED BEADS 180 MG PO CP24
180.0000 mg | ORAL_CAPSULE | Freq: Every day | ORAL | Status: DC
  Administered 2015-09-29 – 2015-10-04 (×6): 180 mg via ORAL
  Filled 2015-09-29 (×8): qty 1

## 2015-09-29 MED ORDER — HEPARIN BOLUS VIA INFUSION
2000.0000 [IU] | Freq: Once | INTRAVENOUS | Status: AC
  Administered 2015-09-29: 2000 [IU] via INTRAVENOUS
  Filled 2015-09-29: qty 2000

## 2015-09-29 MED ORDER — ENOXAPARIN SODIUM 60 MG/0.6ML ~~LOC~~ SOLN
60.0000 mg | Freq: Two times a day (BID) | SUBCUTANEOUS | Status: DC
  Filled 2015-09-29 (×2): qty 0.6

## 2015-09-29 MED ORDER — ALPRAZOLAM 0.25 MG PO TABS
0.5000 mg | ORAL_TABLET | Freq: Once | ORAL | Status: AC
  Administered 2015-09-29: 0.5 mg via ORAL
  Filled 2015-09-29: qty 2

## 2015-09-29 MED ORDER — WARFARIN SODIUM 5 MG PO TABS
5.0000 mg | ORAL_TABLET | Freq: Once | ORAL | Status: AC
  Administered 2015-09-29: 5 mg via ORAL
  Filled 2015-09-29 (×2): qty 1

## 2015-09-29 MED ORDER — VANCOMYCIN HCL IN DEXTROSE 1-5 GM/200ML-% IV SOLN
1000.0000 mg | Freq: Once | INTRAVENOUS | Status: AC
  Administered 2015-09-29: 1000 mg via INTRAVENOUS
  Filled 2015-09-29: qty 200

## 2015-09-29 MED ORDER — METHYLPREDNISOLONE SODIUM SUCC 40 MG IJ SOLR
40.0000 mg | Freq: Two times a day (BID) | INTRAMUSCULAR | Status: DC
  Administered 2015-09-29 – 2015-10-02 (×8): 40 mg via INTRAVENOUS
  Filled 2015-09-29 (×8): qty 1

## 2015-09-29 MED ORDER — IPRATROPIUM BROMIDE 0.02 % IN SOLN
0.5000 mg | Freq: Four times a day (QID) | RESPIRATORY_TRACT | Status: DC
  Administered 2015-09-29 – 2015-10-04 (×18): 0.5 mg via RESPIRATORY_TRACT
  Filled 2015-09-29 (×20): qty 2.5

## 2015-09-29 MED ORDER — IOHEXOL 350 MG/ML SOLN
80.0000 mL | Freq: Once | INTRAVENOUS | Status: AC | PRN
  Administered 2015-09-29: 80 mL via INTRAVENOUS

## 2015-09-29 MED ORDER — METOPROLOL TARTRATE 1 MG/ML IV SOLN
2.5000 mg | Freq: Once | INTRAVENOUS | Status: AC
Start: 2015-09-29 — End: 2015-09-29
  Administered 2015-09-29: 2.5 mg via INTRAVENOUS
  Filled 2015-09-29: qty 5

## 2015-09-29 MED ORDER — LEVALBUTEROL HCL 0.63 MG/3ML IN NEBU
0.6300 mg | INHALATION_SOLUTION | Freq: Four times a day (QID) | RESPIRATORY_TRACT | Status: DC
  Administered 2015-09-29 – 2015-10-04 (×20): 0.63 mg via RESPIRATORY_TRACT
  Filled 2015-09-29 (×24): qty 3

## 2015-09-29 MED ORDER — SODIUM CHLORIDE 0.9 % IJ SOLN
3.0000 mL | Freq: Two times a day (BID) | INTRAMUSCULAR | Status: DC
  Administered 2015-09-29 – 2015-10-03 (×7): 3 mL via INTRAVENOUS

## 2015-09-29 MED ORDER — ACETAMINOPHEN 325 MG PO TABS: 650.0000 mg | ORAL_TABLET | Freq: Four times a day (QID) | ORAL | Status: DC | PRN

## 2015-09-29 MED ORDER — VANCOMYCIN HCL IN DEXTROSE 750-5 MG/150ML-% IV SOLN
750.0000 mg | Freq: Two times a day (BID) | INTRAVENOUS | Status: DC
  Administered 2015-09-29 – 2015-10-04 (×10): 750 mg via INTRAVENOUS
  Filled 2015-09-29 (×13): qty 150

## 2015-09-29 MED ORDER — ONDANSETRON HCL 4 MG PO TABS: 4.0000 mg | ORAL_TABLET | Freq: Four times a day (QID) | ORAL | Status: DC | PRN

## 2015-09-29 MED ORDER — SODIUM CHLORIDE 0.9 % IV BOLUS (SEPSIS)
1000.0000 mL | Freq: Once | INTRAVENOUS | Status: AC
  Administered 2015-09-29: 1000 mL via INTRAVENOUS

## 2015-09-29 MED ORDER — HEPARIN (PORCINE) IN NACL 100-0.45 UNIT/ML-% IJ SOLN
1150.0000 [IU]/h | INTRAMUSCULAR | Status: AC
  Administered 2015-09-29: 900 [IU]/h via INTRAVENOUS
  Administered 2015-09-30 – 2015-10-01 (×2): 1150 [IU]/h via INTRAVENOUS
  Filled 2015-09-29 (×3): qty 250

## 2015-09-29 MED ORDER — LEVALBUTEROL HCL 0.63 MG/3ML IN NEBU
0.6300 mg | INHALATION_SOLUTION | Freq: Four times a day (QID) | RESPIRATORY_TRACT | Status: DC | PRN
  Filled 2015-09-29: qty 3

## 2015-09-29 NOTE — ED Notes (Signed)
Breakfast ordered 

## 2015-09-29 NOTE — Progress Notes (Addendum)
ANTICOAGULATION CONSULT NOTE - Initial Consult  Pharmacy Consult for Coumadin Indication: Afib, h/o DVT  No Known Allergies  Patient Measurements: Height: 5\' 5"  (165.1 cm) Weight: 134 lb (60.782 kg) IBW/kg (Calculated) : 57  Vital Signs: Temp: 99.5 F (37.5 C) (01/10 1948) Temp Source: Rectal (01/10 1948) BP: 137/93 mmHg (01/11 0201) Pulse Rate: 131 (01/11 0201)  Labs:  Recent Labs  09/28/15 1921  HGB 9.1*  HCT 31.1*  PLT 452*  CREATININE 0.83    Estimated Creatinine Clearance: 54.3 mL/min (by C-G formula based on Cr of 0.83).   Medical History: Past Medical History  Diagnosis Date  . Acute respiratory failure (HCC)   . Community acquired pneumonia   . COPD with acute exacerbation (HCC)   . Anemia   . Hypokalemia   . Hyponatremia   . CHF (congestive heart failure) (HCC)   . Hypertension   . Shortness of breath dyspnea   . History of kidney stones     Medications:  Albuterol  Brovana  Cardizem  Advair  Lasix  Prednisone  Spiriva Coumadin 2.5 mg daily  Assessment: 75 y.o. female admitted with SOB/weakness, h/o Afib and DVT, for Coumadin Noted on recent admit  12/22-12/27 pt was changed to Eliquis on discharge due to INR lability.  Now back on Coumadin  Goal of Therapy:  INR 2-3 Monitor platelets by anticoagulation protocol: Yes   Plan:  F/U daily INR  Bernell Haynie, Gary FleetGregory Vernon 09/29/2015,2:51 AM  Addendem: INR this morning 1.2  Adding Lovenox as bridge until INR > 2. Will give Lovenox 60 mg BID Coumadin 5 mg today  Geannie RisenGreg Omarian Jaquith, PharmD, BCPS 09/29/2015 7:14 AM   Addendum: Lovenox changed to heparin. Heparin 2000 units IV bolus, then start heparin 900 units/hr Check heparin level in 8 hours.  Geannie RisenGreg Koraima Albertsen, PharmD, BCPS  09/29/2015 7:23 AM

## 2015-09-29 NOTE — H&P (Addendum)
[facility] History and Physical  Brenda Asadna G Woolworth GNF:[id number] DOB: 16-Dec-1940 DOA: 09/28/2015  Referring physician: Dr.Wentz. PCP: [name],[name], MD  Specialists: None.  Chief Complaint: Shortness of breath.  HPI: Brenda Wells is a 75 y.o. female history of COPD on home oxygen 4 L nasal cannula, diastolic CHF, chronic atrial fibrillation, history of DVT presents to the ER because of sudden onset of shortness of breath since yesterday afternoon after 4 PM. Denies any chest pain. Has been having some cough. Denies any fever or chills. Chest x-ray in the ER shows mild left pleural effusion. Patient was wheezing and required nebulizer treatment. Patient's heart rate on my exam has increased and was in A. fib with RVR. Patient denies any nausea vomiting or diarrhea. Lactic acid was elevated and patient was given 500 mL normal saline bolus. Patient will be admitted for acute respiratory failure probably from COPD exacerbation with possible developing sepsis.   Review of Systems: As presented in the history of presenting illness, rest negative.  Past Medical History  Diagnosis Date  . Acute respiratory failure (HCC)   . Community acquired pneumonia   . COPD with acute exacerbation (HCC)   . Anemia   . Hypokalemia   . Hyponatremia   . CHF (congestive heart failure) (HCC)   . Hypertension   . Shortness of breath dyspnea   . History of kidney stones    Past Surgical History  Procedure Laterality Date  . Back surgery    . Cyst excision      base of tongue  . Cholecystectomy    . Abdominal hysterectomy     Social History:  reports that she has been smoking Cigarettes.  She has a 6 pack-year smoking history. She has never used smokeless tobacco. She reports that she does not drink alcohol or use illicit drugs. Where does patient live home. Can patient participate in ADLs? Yes.  No Known Allergies  Family History:  Family History  Problem Relation Age of Onset  . Diabetes  Mellitus II Mother   . CAD Mother   . CAD Brother       Prior to Admission medications   Medication Sig Start Date End Date Taking? Authorizing Provider  albuterol (PROVENTIL) (2.5 MG/3ML) 0.083% nebulizer solution Take 3 mLs (2.5 mg total) by nebulization every 2 (two) hours as needed for wheezing or shortness of breath. 09/07/15  Yes Richarda OverlieNayana Abrol, MD  arformoterol (BROVANA) 15 MCG/2ML NEBU Take 2 mLs (15 mcg total) by nebulization 2 (two) times daily. 09/07/15  Yes Richarda OverlieNayana Abrol, MD  diltiazem (CARDIZEM CD) 180 MG 24 hr capsule Take 1 capsule (180 mg total) by mouth daily. 09/07/15  Yes Richarda OverlieNayana Abrol, MD  Fluticasone-Salmeterol (ADVAIR) 500-50 MCG/DOSE AEPB Inhale 1 puff into the lungs 2 (two) times daily.   Yes Historical Provider, MD  furosemide (LASIX) 40 MG tablet Take 40 mg by mouth daily.   Yes Historical Provider, MD  predniSONE (DELTASONE) 10 MG tablet Take 10 mg by mouth daily with breakfast.   Yes Historical Provider, MD  SPIRIVA HANDIHALER 18 MCG inhalation capsule Place 18 mcg into inhaler and inhale daily.  08/24/15  Yes Historical Provider, MD  warfarin (COUMADIN) 5 MG tablet Take 2.5 mg by mouth daily.   Yes Historical Provider, MD  apixaban (ELIQUIS) 5 MG TABS tablet Take 1 tablet (5 mg total) by mouth 2 (two) times daily. Patient not taking: Reported on 09/28/2015 09/14/15   Leroy SeaPrashant K Singh, MD  budesonide (PULMICORT) 0.25 MG/2ML nebulizer  solution Take 2 mLs (0.25 mg total) by nebulization 2 (two) times daily. Patient not taking: Reported on 09/28/2015 09/07/15   Richarda Overlie, MD  guaiFENesin (MUCINEX) 600 MG 12 hr tablet Take 2 tablets (1,200 mg total) by mouth 2 (two) times daily. Patient not taking: Reported on 09/28/2015 09/07/15   Richarda Overlie, MD  predniSONE (DELTASONE) 5 MG tablet Label  & dispense according to the schedule below. 10 Pills PO for 3 days then, 8 Pills PO for 3 days, 6 Pills PO for 3 days, 4 Pills PO for 3 days, 2 Pills PO for 3 days, 1 Pills PO for 3 days,  1/2 Pill  PO for 3 days then STOP. Total 95 pills. Patient not taking: Reported on 09/28/2015 09/14/15   Leroy Sea, MD    Physical Exam: Filed Vitals:   09/28/15 2130 09/28/15 2152 09/28/15 2215 09/28/15 2345  BP: 117/60  112/78 103/58  Pulse: 100  103 116  Temp:      TempSrc:      Resp: 32  26 33  Height:      Weight:      SpO2: 97% 100% 99% 96%     General:  Moderately built and nourished.  Eyes: Anicteric no pallor.  ENT: No discharge from the ears eyes nose and mouth.  Neck: No JVD appreciated no mass felt.  Cardiovascular: S1 and S2 heard.  Respiratory: Mild expiratory wheeze or crepitations.  Abdomen: Soft nontender bowel sounds present.  Skin: No rash.  Musculoskeletal: No edema.  Psychiatric: Appears normal.  Neurologic: Alert awake oriented to time place and person. Moves all extremities.  Labs on Admission:  Basic Metabolic Panel:  Recent Labs Lab 09/28/15 1921  NA 141  K 3.8  CL 95*  CO2 33*  GLUCOSE 161*  BUN 18  CREATININE 0.83  CALCIUM 9.0   Liver Function Tests:  Recent Labs Lab 09/28/15 1921  AST 18  ALT 13*  ALKPHOS 69  BILITOT 0.1*  PROT 6.0*  ALBUMIN 2.7*   No results for input(s): LIPASE, AMYLASE in the last 168 hours. No results for input(s): AMMONIA in the last 168 hours. CBC:  Recent Labs Lab 09/28/15 1921  WBC 9.2  NEUTROABS 6.7  HGB 9.1*  HCT 31.1*  MCV 81.2  PLT 452*   Cardiac Enzymes: No results for input(s): CKTOTAL, CKMB, CKMBINDEX, TROPONINI in the last 168 hours.  BNP (last 3 results)  Recent Labs  07/21/15 1638 09/02/15 0124 09/09/15 1350  BNP 102.0* 64.9 102.1*    ProBNP (last 3 results) No results for input(s): PROBNP in the last 8760 hours.  CBG: No results for input(s): GLUCAP in the last 168 hours.  Radiological Exams on Admission: Dg Chest 2 View  09/28/2015  CLINICAL DATA:  Shortness of breath, weakness and history of COPD. EXAM: CHEST - 2 VIEW COMPARISON:  09/13/2015  FINDINGS: Stable advanced emphysematous lung disease with moderate bilateral hyperinflation. There is no evidence of pulmonary edema, consolidation, pneumothorax or nodule. Tiny left pleural effusion present. Stable top-normal heart size. IMPRESSION: Stable advanced emphysema with hyperinflation. Small left pleural effusion without overt pulmonary edema or focal infiltrate. Electronically Signed   By: Irish Lack M.D.   On: 09/28/2015 20:39    EKG: Independently reviewed. A. fib with RVR.  Assessment/Plan Principal Problem:   Acute on chronic respiratory failure with hypoxia (HCC) Active Problems:   COPD exacerbation (HCC)   History of DVT (deep vein thrombosis)   Chronic diastolic (congestive) heart failure (HCC)  Atrial fibrillation with RVR (HCC)   Acute respiratory failure (HCC)   1. Acute on chronic respiratory failure with possible developing sepsis - respiratory failure most likely from COPD exacerbation. Since patient's heart rate is elevated active patient on Xopenex with Atrovent. Pulmicort. IV steroids. Empiric antibiotics. Check pro-calcitonin levels and follow blood cultures for possible developing sepsis. Holding of Lasix due to patient's lactic acid being elevated. Patient did receive 500 mL normal saline bolus. Following which patient's lactic acid did improve but again it has worsened for which I have ordered 1 L normal saline bolus. Caution that patient has CHF. Patient states she takes Coumadin and not Apixaban. If INR is nontherapeutic will get CT angiography of the chest to rule out PE. 2. A. fib with RVR - probably according to patient's shortness of breath. If patient's blood pressure does not improve then will need amiodarone drip and cardiology input. Patient is on Cardizem orally. See #1 regarding anticoagulation. 3. History of DVT - see #1. 4. Chronic diastolic CHF with last EF measured was 55-60% - see #1. 5. Chronic anemia - follow CBC.  This is the third  admission for patient last 1 month. I have consulted palliative care team.   DVT Prophylaxis Coumadin. If INR is subtherapeutic will place patient on heparin. Code Status: DO NOT RESUSCITATE.  Family Communication: Discussed with patient.  Disposition Plan: Admit to inpatient.    Ruthel Martine N. [facility] Pager [phone].  If 7PM-7AM, please contact night-coverage [email or web address] Password [email or web address] 09/29/2015, 12:20 AM

## 2015-09-29 NOTE — Progress Notes (Signed)
ANTIBIOTIC CONSULT NOTE - INITIAL  Pharmacy Consult for Vancomycin and Zosyn  Indication: rule out sepsis  No Known Allergies  Patient Measurements: Height: 5\' 5"  (165.1 cm) Weight: 134 lb (60.782 kg) IBW/kg (Calculated) : 57  Vital Signs: Temp: 99.5 F (37.5 C) (01/10 1948) Temp Source: Rectal (01/10 1948) BP: 109/54 mmHg (01/11 0600) Pulse Rate: 97 (01/11 0600) Intake/Output from previous day: 01/10 0701 - 01/11 0700 In: 500 [I.V.:500] Out: -  Intake/Output from this shift: Total I/O In: 500 [I.V.:500] Out: -   Labs:  Recent Labs  09/28/15 1921 09/29/15 0513  WBC 9.2 8.3  HGB 9.1* 7.8*  PLT 452* 415*  CREATININE 0.83 0.82   Estimated Creatinine Clearance: 55 mL/min (by C-G formula based on Cr of 0.82). No results for input(s): VANCOTROUGH, VANCOPEAK, VANCORANDOM, GENTTROUGH, GENTPEAK, GENTRANDOM, TOBRATROUGH, TOBRAPEAK, TOBRARND, AMIKACINPEAK, AMIKACINTROU, AMIKACIN in the last 72 hours.   Microbiology: Recent Results (from the past 720 hour(s))  Culture, blood (routine x 2) Call MD if unable to obtain prior to antibiotics being given     Status: None   Collection Time: 09/02/15  3:28 AM  Result Value Ref Range Status   Specimen Description BLOOD RIGHT ARM  Final   Special Requests BOTTLES DRAWN AEROBIC AND ANAEROBIC 5ML  Final   Culture NO GROWTH 5 DAYS  Final   Report Status 09/07/2015 FINAL  Final  Culture, blood (routine x 2) Call MD if unable to obtain prior to antibiotics being given     Status: None   Collection Time: 09/02/15  3:30 AM  Result Value Ref Range Status   Specimen Description BLOOD LEFT ARM  Final   Special Requests BOTTLES DRAWN AEROBIC AND ANAEROBIC 5ML  Final   Culture NO GROWTH 5 DAYS  Final   Report Status 09/07/2015 FINAL  Final  MRSA PCR Screening     Status: Abnormal   Collection Time: 09/02/15  1:16 PM  Result Value Ref Range Status   MRSA by PCR POSITIVE (A) NEGATIVE Final    Comment:        The GeneXpert MRSA Assay  (FDA approved for NASAL specimens only), is one component of a comprehensive MRSA colonization surveillance program. It is not intended to diagnose MRSA infection nor to guide or monitor treatment for MRSA infections. RESULT CALLED TO, READ BACK BY AND VERIFIED WITH: J. LAUER RN 15:45 09/02/15 ([email or web address])    Assessment: 75 y.o. female with SOB/weaknees, possible sepsis, for empiric antibiotics  Goal of Therapy:  Vancomycin trough level 15-20 mcg/ml  Plan:  Vancomycin 1 g IV now, then 750 mg IV q12h Zosyn 3.375 g IV q8h   Eddie Candlebbott, Janay Canan Vernon 09/29/2015,6:26 AM

## 2015-09-29 NOTE — Progress Notes (Signed)
   [facility]                                                                              Patient Demographics  Stevphen Meusedna Caven, is a 75 y.o. female, DOB - 1941-08-17, UJW:[id number]  Admit date - 09/28/2015   Admitting Physician No admitting provider for patient encounter.  Outpatient Primary MD for the patient is [name],[name], MD  LOS - 1   Chief Complaint  Patient presents with  . Shortness of Breath      HPI on 09/29/2015 by Dr. Midge MiniumArshad Kakrakandy Harlow Asadna G Englert is a 75 y.o. female history of COPD on home oxygen 4 L nasal cannula, diastolic CHF, chronic atrial fibrillation, history of DVT presents to the ER because of sudden onset of shortness of breath since yesterday afternoon after 4 PM. Denies any chest pain. Has been having some cough. Denies any fever or chills. Chest x-ray in the ER shows mild left pleural effusion. Patient was wheezing and required nebulizer treatment. Patient's heart rate on my exam has increased and was in A. fib with RVR. Patient denies any nausea vomiting or diarrhea. Lactic acid was elevated and patient was given 500 mL normal saline bolus. Patient will be admitted for acute respiratory failure probably from COPD exacerbation with possible developing sepsis.    Assessment & Plan   Patient admitted earlier this morning by Dr. Midge MiniumArshad Kakrakandy. Please see full H&P for details, agree with assessment and plan.  Acute on chronic respiratory failure/COPD exacerbation/possible sepsis -Continue nebs, Pulmicort, IV steroids, antibiotics-vancomycin/Zosyn -Lactic acid 4.1 -Chest x-ray showed stable advanced emphysema with hyperinflation, small left pleural effusion without edema or focal infiltrate -CTA chest negative for pulmonary embolism or acute disease  Atrial fibrillation with RVR -Likely secondary to the above -Continue Coumadin/heparin per pharmacy (heparin added as INR was 1.2) -Continue Cardizem  History of DVT -Continue Coumadin per  pharmacy  Chronic diastolic heart failure -Echocardiogram 07/22/2015 shows an EF of 50-55% -Currently appears to be euvolemic -continue to monitor intake and output, daily weights  Chronic anemia -Hemoglobin currently 7.8, baseline appears to be an 8-9 -Continue to monitor CBC  Code Status: DNR  Family Communication: None at bedside  Disposition Plan: Admitted.   Time Spent in minutes   30 minutes  Procedures  None  Consults   None  DVT Prophylaxis  Coumadin  Amal Saiki D.O. on 09/29/2015 at 1:34 PM  Between 7am to 7pm - Pager - [phone]  After 7pm go to [email or web address] - password [email or web address]  And look for the night coverage person covering for me after hours  [facility] Office  [phone]

## 2015-09-29 NOTE — ED Notes (Signed)
Paged Toniann FailKakrakandy about lactic 3.5

## 2015-09-29 NOTE — ED Notes (Signed)
Pt called out, feels anxious, HR inc, RR inc. Pt noted to be shaking upon assessment. Called MacdonaKakrakandy to inform. Verbal order for 0.5mg  Xanax

## 2015-09-29 NOTE — Progress Notes (Signed)
ANTICOAGULATION CONSULT NOTE - Initial Consult  Pharmacy Consult for Coumadin and Heparin Indication: Afib, h/o DVT  No Known Allergies  Patient Measurements: Height: 5\' 5"  (165.1 cm) Weight: 141 lb 3.2 oz (64.048 kg) IBW/kg (Calculated) : 57  Vital Signs: BP: 113/70 mmHg (01/11 1530) Pulse Rate: 116 (01/11 1530)  Labs:  Recent Labs  09/28/15 1921 09/29/15 0513 09/29/15 1009 09/29/15 1620  HGB 9.1* 7.8*  --   --   HCT 31.1* 26.1*  --   --   PLT 452* 415*  --   --   LABPROT  --  15.4*  --   --   INR  --  1.20  --   --   HEPARINUNFRC  --   --   --  <0.10*  CREATININE 0.83 0.82  --   --   TROPONINI  --  <0.03 <0.03  --     Estimated Creatinine Clearance: 55 mL/min (by C-G formula based on Cr of 0.82).   Medical History: Past Medical History  Diagnosis Date  . Acute respiratory failure (HCC)   . Community acquired pneumonia   . COPD with acute exacerbation (HCC)   . Anemia   . Hypokalemia   . Hyponatremia   . CHF (congestive heart failure) (HCC)   . Hypertension   . Shortness of breath dyspnea   . History of kidney stones     Medications:  Albuterol  Brovana  Cardizem  Advair  Lasix  Prednisone  Spiriva Coumadin 2.5 mg daily  Assessment: 75 y.o. female admitted with SOB/weakness, h/o Afib and DVT, for Coumadin Noted on recent admit  12/22-12/27 pt was changed to Eliquis on discharge due to INR lability.  Pharmacy is consulted to dose warfarin and heparin bridge for Afib and history of DVT.  Initial HL is undetectable on heparin 900 units/hr. Nurse reports no issues with infusion or bleeding.   Goal of Therapy:  INR 2-3 Heparin level 0.3-0.7 units/ml Monitor platelets by anticoagulation protocol: Yes   Plan:  Bolus 2000 units of heparin and increase to 1100 units/hr Warfarin 5mg  tonight x1 F/U daily INR  Arlean Hoppingorey M. Newman PiesBall, PharmD, BCPS Clinical Pharmacist Pager [phone] 09/29/2015,4:50 PM

## 2015-09-29 NOTE — ED Notes (Signed)
Breakfast Tray ordered @0649 

## 2015-09-29 NOTE — ED Notes (Signed)
Patient transported to CT 

## 2015-09-30 DIAGNOSIS — R0600 Dyspnea, unspecified: Secondary | ICD-10-CM | POA: Diagnosis present

## 2015-09-30 DIAGNOSIS — J9621 Acute and chronic respiratory failure with hypoxia: Secondary | ICD-10-CM

## 2015-09-30 DIAGNOSIS — Z515 Encounter for palliative care: Secondary | ICD-10-CM

## 2015-09-30 DIAGNOSIS — Z86718 Personal history of other venous thrombosis and embolism: Secondary | ICD-10-CM

## 2015-09-30 DIAGNOSIS — Z66 Do not resuscitate: Secondary | ICD-10-CM | POA: Diagnosis present

## 2015-09-30 LAB — IRON AND TIBC
IRON: 25 ug/dL — AB (ref 28–170)
Saturation Ratios: 7 % — ABNORMAL LOW (ref 10.4–31.8)
TIBC: 337 ug/dL (ref 250–450)
UIBC: 312 ug/dL

## 2015-09-30 LAB — FERRITIN: FERRITIN: 37 ng/mL (ref 11–307)

## 2015-09-30 LAB — TROPONIN I
TROPONIN I: 0.1 ng/mL — AB (ref <0.031)
TROPONIN I: 0.13 ng/mL — AB (ref <0.031)
Troponin I: 0.15 ng/mL — ABNORMAL HIGH (ref <0.031)

## 2015-09-30 LAB — BASIC METABOLIC PANEL
Anion gap: 8 (ref 5–15)
BUN: 15 mg/dL (ref 6–20)
CALCIUM: 8.6 mg/dL — AB (ref 8.9–10.3)
CHLORIDE: 101 mmol/L (ref 101–111)
CO2: 29 mmol/L (ref 22–32)
CREATININE: 0.69 mg/dL (ref 0.44–1.00)
GFR calc Af Amer: >60 mL/min (ref >60)
GFR calc non Af Amer: >60 mL/min (ref >60)
GLUCOSE: 206 mg/dL — AB (ref 65–99)
Potassium: 4.4 mmol/L (ref 3.5–5.1)
Sodium: 138 mmol/L (ref 135–145)

## 2015-09-30 LAB — RETICULOCYTES
RBC.: 3.25 MIL/uL — ABNORMAL LOW (ref 3.87–5.11)
RETIC COUNT ABSOLUTE: 78 10*3/uL (ref 19.0–186.0)
Retic Ct Pct: 2.4 % (ref 0.4–3.1)

## 2015-09-30 LAB — CBC
HEMATOCRIT: 25 % — AB (ref 36.0–46.0)
HEMOGLOBIN: 7.4 g/dL — AB (ref 12.0–15.0)
MCH: 23.6 pg — AB (ref 26.0–34.0)
MCHC: 29.6 g/dL — AB (ref 30.0–36.0)
MCV: 79.6 fL (ref 78.0–100.0)
Platelets: 456 10*3/uL — ABNORMAL HIGH (ref 150–400)
RBC: 3.14 MIL/uL — ABNORMAL LOW (ref 3.87–5.11)
RDW: 19.4 % — AB (ref 11.5–15.5)
WBC: 11.4 10*3/uL — ABNORMAL HIGH (ref 4.0–10.5)

## 2015-09-30 LAB — HEPARIN LEVEL (UNFRACTIONATED)
Heparin Unfractionated: 0.3 IU/mL (ref 0.30–0.70)
Heparin Unfractionated: 0.48 IU/mL (ref 0.30–0.70)

## 2015-09-30 LAB — URINE CULTURE: Culture: NO GROWTH

## 2015-09-30 LAB — VITAMIN B12: VITAMIN B 12: 189 pg/mL (ref 180–914)

## 2015-09-30 LAB — PROTIME-INR
INR: 1.2 (ref 0.00–1.49)
Prothrombin Time: 15.4 seconds — ABNORMAL HIGH (ref 11.6–15.2)

## 2015-09-30 LAB — FOLATE: FOLATE: 13.9 ng/mL (ref >5.9)

## 2015-09-30 LAB — LACTIC ACID, PLASMA: Lactic Acid, Venous: 2.8 mmol/L (ref 0.5–2.0)

## 2015-09-30 MED ORDER — CHLORHEXIDINE GLUCONATE CLOTH 2 % EX PADS
6.0000 | MEDICATED_PAD | Freq: Every day | CUTANEOUS | Status: DC
  Administered 2015-10-01 – 2015-10-04 (×4): 6 via TOPICAL

## 2015-09-30 MED ORDER — WARFARIN SODIUM 5 MG PO TABS
5.0000 mg | ORAL_TABLET | Freq: Once | ORAL | Status: DC
  Filled 2015-09-30: qty 1

## 2015-09-30 MED ORDER — OXYCODONE HCL 5 MG PO TABS
2.5000 mg | ORAL_TABLET | ORAL | Status: DC | PRN
  Administered 2015-09-30 – 2015-10-02 (×3): 2.5 mg via ORAL
  Filled 2015-09-30 (×3): qty 1

## 2015-09-30 MED ORDER — WARFARIN SODIUM 5 MG PO TABS
5.0000 mg | ORAL_TABLET | Freq: Once | ORAL | Status: AC
  Administered 2015-09-30: 5 mg via ORAL

## 2015-09-30 MED ORDER — MUPIROCIN 2 % EX OINT
1.0000 "application " | TOPICAL_OINTMENT | Freq: Two times a day (BID) | CUTANEOUS | Status: DC
  Administered 2015-09-30 – 2015-10-04 (×8): 1 via NASAL
  Filled 2015-09-30 (×3): qty 22

## 2015-09-30 NOTE — Progress Notes (Signed)
Utilization review completed. Nyjae Hodge, RN, BSN. 

## 2015-09-30 NOTE — Progress Notes (Signed)
ANTICOAGULATION CONSULT NOTE - Follow-up Consult  Pharmacy Consult for Coumadin and Heparin Indication: Afib, h/o DVT  No Known Allergies  Patient Measurements: Height: 5\' 5"  (165.1 cm) Weight: 141 lb 3.2 oz (64.048 kg) IBW/kg (Calculated) : 57  Vital Signs: Temp: 98.4 F (36.9 C) (01/12 0000) Temp Source: Oral (01/12 0000) BP: 106/55 mmHg (01/12 0000) Pulse Rate: 99 (01/12 0000)  Labs:  Recent Labs  09/28/15 1921 09/29/15 0513 09/29/15 1009 09/29/15 1620 09/30/15 0225  HGB 9.1* 7.8*  --   --  7.4*  HCT 31.1* 26.1*  --   --  25.0*  PLT 452* 415*  --   --  456*  LABPROT  --  15.4*  --   --  15.4*  INR  --  1.20  --   --  1.20  HEPARINUNFRC  --   --   --  <0.10* 0.30  CREATININE 0.83 0.82  --   --   --   TROPONINI  --  <0.03 <0.03 0.05*  --     Estimated Creatinine Clearance: 55 mL/min (by C-G formula based on Cr of 0.82).   Assessment: 75 y.o. female admitted with SOB/weakness, h/o Afib and DVT, for Coumadin. Noted on recent admit 12/22-12/27 pt was changed to Eliquis on discharge due to INR lability but for unknown reason switched back to coumadin as o/p. Pharmacy is consulted to dose warfarin and heparin bridge for Afib and history of DVT. INR with no movement past increased dose of coumadin yesterday. Heparin level at low end of therapeutic (0.3). No bleeding noted.  Goal of Therapy:  INR 2-3 Heparin level 0.3-0.7 units/ml Monitor platelets by anticoagulation protocol: Yes   Plan:  Increase heparin to 1150 units/hr Will f/u 8 hr confirmatory heparin level Warfarin 5mg  again tonight F/U daily INR, heparin level, and CBC  Christoper Fabianaron Joenathan Sakuma, PharmD, BCPS Clinical pharmacist, pager [phone] 09/30/2015,3:09 AM

## 2015-09-30 NOTE — Care Management Note (Addendum)
Case Management Note  Patient Details  Name: Brenda Wells MRN: [id number] Date of Birth: 07/26/1941  Subjective/Objective:  Pt admitted for COPD Exacerbation. Previous Readmission: Pt is from home with son. [facility] RN [facility]. Per [employer] added PT services as well. Pt will need to be consulted by PT for additional needs.                   Action/Plan: CM will continue to monitor for disposition needs.   Expected Discharge Date:                  Expected Discharge Plan:  Skilled Nursing Facility  In-House Referral:  Clinical Social Work  Discharge planning Services  CM Consult  Post Acute Care Choice:    Choice offered to:     DME Arranged:    DME Agency:     HH Arranged:    HH Agency:     Status of Service:  In process, will continue to follow  Medicare Important Message Given:    Date Medicare IM Given:    Medicare IM give by:    Date Additional Medicare IM Given:    Additional Medicare Important Message give by:     If discussed at Long Length of Stay Meetings, dates discussed:    Additional Comments: 1019 10-01-15 Tomi BambergerBrenda Graves-Bigelow, RN,BSN [phone] [facility]: [facility]. Please call Shelda Paludrey [place] Office @ [phone] once stable for d/c if over weekend and fax information to [phone].  [name], Junious Ragone Kaye, RN 09/30/2015, 12:35 PM

## 2015-09-30 NOTE — Progress Notes (Signed)
ANTICOAGULATION CONSULT NOTE - Follow Up Consult  Pharmacy Consult for Heparin and Coumadin Indication: atrial fibrillation and hx DVT  No Known Allergies  Patient Measurements: Height: 5\' 5"  (165.1 cm) Weight: 140 lb 12.8 oz (63.866 kg) IBW/kg (Calculated) : 57 Heparin Dosing Weight: 64 kg  Vital Signs: Temp: 98.2 F (36.8 C) (01/12 0824) Temp Source: Oral (01/12 0824) BP: 105/59 mmHg (01/12 0500) Pulse Rate: 96 (01/12 0500)  Labs:  Recent Labs  09/28/15 1921  09/29/15 0513 09/29/15 1009 09/29/15 1620 09/30/15 0225 09/30/15 0830  HGB 9.1*  --  7.8*  --   --  7.4*  --   HCT 31.1*  --  26.1*  --   --  25.0*  --   PLT 452*  --  415*  --   --  456*  --   LABPROT  --   --  15.4*  --   --  15.4*  --   INR  --   --  1.20  --   --  1.20  --   HEPARINUNFRC  --   --   --   --  <0.10* 0.30 0.48  CREATININE 0.83  --  0.82  --   --  0.69  --   TROPONINI  --   < > <0.03 <0.03 0.05*  --  0.15*  < > = values in this interval not displayed.  Estimated Creatinine Clearance: 56.4 mL/min (by C-G formula based on Cr of 0.69).  Assessment:   Heparin level is therapeutic (0.48) on 1150 units/hr.   INR unchanged (1.2) after Coumadin 5 mg last night.  Hgb has trended down. Anemia panel pending. No bleeding noted. No check stools for occult blood.   Changed from Eliquis to Coumadin on 09/12/16 during 12/22-27/16 admission.  Patient reports that she did not continue Eliquis after discharge, since she wanted to use up her Coumadin supply before starting Eliquis. She thinks she has about 10 Coumadin tablets left.   Goal of Therapy:  INR 2-3 Heparin level 0.3-0.7 units/ml Monitor platelets by anticoagulation protocol: Yes   Plan:   Continue heparin drip at 1150 units/hr.  Repeat Coumadin 5 mg today.  Daily heparin level, PT/INR and CBC.  Follow up anemia panel and for any s/sx bleeding.  Switch to Eliquis at some point?  Dennie FettersEgan, Breezie Micucci Donovan, RPh Pager: [phone] 09/30/2015,1:25  PM

## 2015-09-30 NOTE — Consult Note (Signed)
Consultation Note Date: 09/30/2015   Patient Name: Brenda Wells  DOB: November 21, 1940  MRN: [id number]  Age / Sex: 75 y.o., female  PCP: Charlott RakesFrancisco [name], MD Referring Physician: Edsel PetrinMaryann Mikhail, DO  Reason for Consultation: Establishing goals of care, Hospice Evaluation and Non pain symptom management.  Brenda [place] is a 75 [place] severe COPD, mild CHF, and a history of atrial fibrillation who lives at home with her son and has had 4 hospitalizations in the past 3 months for COPD exacerbations. Brenda Wells has been an independent hard-working career minded woman who appears to have a very realistic understanding of her current health status. She is aware that COPD is a life limiting illness and that she may not have many months left. She reconfirms her DNR/DNI status, but states she will try anything else that may improve her breathing or quality of life. She is on 4 L of oxygen at home and chronic prednisone. She is able to do very little. Even getting dressed or taking just a few steps causes her significant shortness of breath. She still smokes one cigarette per day and at this point does not see why she should give that up. Her son with whom she lives, also smokes. She has excellent support at home between her son, his significant other (who is a LawyerCNA) and a Water engineerhome health aide.  She knows her health is declining because she not only becomes short of breath with movement, she also has developed significant palpitations with any exertion. We discussed that it is difficult to give prognosis with COPD, but she may have less than 6 months. We talked about dying at home versus a residential hospice facility. She stated that it didn't matter to her, she would leave that decision to her son, Brenda Wells.  She mentioned to me that occasionally her eyes will feel funny, she develops double vision, and will fall  if she attempts to stand.  She has dealt with this by simply not standing until the double vision passes.   Because this is not happened very frequently (less than once a year) until recently, she has not seen a physician about this problem. In the last several months it has happened twice.  We discussed symptom management of her dyspnea.  I offered very low dose oxycodone with cautions about sedation, nausea and constipation.  She said she had a very difficult night last night and wanted to try the oxycodone if that happens again.  Home hospice was brought up.  Brenda Wells has heard from a friend that the hospice services in her area were not useful so she was not inclined to use them.  I advised that there are multiple hospice groups available and that we could likely help her find one that was useful.  I attempted to reach her son, Brenda Wells by phone but his voice mail box was full.  Contacts/Participants in Discussion: Patient Primary Decision Maker: Patient is of clear mind. Relationship to Patient self HCPOA: No, but it would default to her son, Brenda Wells.   SUMMARY OF RECOMMENDATIONS  DNR / DNI Confirmed. Trial of low dose oxycodone for SOB - with cautions about sedation, nausea and constipation  Patient will elect to use home Hospice services when she feels she is ready for them. Eligible for hospice due to:  Decreased functional capacity; progression of disease evidenced by increasing number of office visits and hospitalizations, and Hypercapnia pCO2>50 mmhg.  We will revisit tomorrow to determine if  oxycodone was useful and to revisit the idea of using hospice services at home.  Code Status/Advance Care Planning: DNR    Code Status Orders        Start     Ordered   09/29/15 0420  Do not attempt resuscitation (DNR)   Continuous    Question Answer Comment  In the event of cardiac or respiratory ARREST Do not call a "code blue"   In the event of cardiac or respiratory ARREST Do not  perform Intubation, CPR, defibrillation or ACLS   In the event of cardiac or respiratory ARREST Use medication by any route, position, wound care, and other measures to relive pain and suffering. May use oxygen, suction and manual treatment of airway obstruction as needed for comfort.      09/29/15 0420    Code Status History    Date Active Date Inactive Code Status Order ID Comments User Context   09/09/2015  8:55 PM 09/14/2015  4:48 PM DNR [id number]  Russella Dar, NP Inpatient   09/02/2015 10:17 AM 09/07/2015  6:36 PM DNR [id number]  Richarda Overlie, MD Inpatient   09/02/2015  3:10 AM 09/02/2015 10:17 AM Partial Code [id number]  Lorretta Harp, MD ED   07/22/2015 12:30 AM 07/27/2015  6:42 PM DNR [id number]  Eduard Clos, MD Inpatient      Other Directives:None  Symptom Management:   Low dose oxy for shortness of breath.   Psycho-social/Spiritual:  Support System: Strong Desire for further Chaplaincy support:No Additional Recommendations: Caregiving  Support/Resources  Prognosis: Given advanced COPD with SOB with minimal exertion, recurrent hospitalizations in a brief period of time, and worsening Afib with RVR with INR being repeatedly subtherapeutic, it is likely the patient will die in 6 months or less.  Discharge Planning: Home with home health services vs Home with hospice services.   Chief Complaint/ Primary Diagnoses: Present on Admission:  . COPD exacerbation (HCC) . Acute on chronic respiratory failure with hypoxia (HCC) . Chronic diastolic (congestive) heart failure (HCC) . Atrial fibrillation with RVR (HCC) . Acute respiratory failure (HCC)  I have reviewed the medical record, interviewed the patient and family, and examined the patient. The following aspects are pertinent.  Past Medical History  Diagnosis Date  . Acute respiratory failure (HCC)   . Community acquired pneumonia   . COPD with acute exacerbation (HCC)   . Anemia   . Hypokalemia   . Hyponatremia    . CHF (congestive heart failure) (HCC)   . Hypertension   . Shortness of breath dyspnea   . History of kidney stones    Social History   Social History  . Marital Status: Single    Spouse Name: N/A  . Number of Children: N/A  . Years of Education: N/A   Social History Main Topics  . Smoking status: Current Every Day Smoker -- 0.10 packs/day for 60 years    Types: Cigarettes  . Smokeless tobacco: Never Used     Comment: I smoke 1 cigarette a day "  . Alcohol Use: No  . Drug Use: No  . Sexual Activity: Not Asked   Other Topics Concern  . None   Social History Narrative   Family History  Problem Relation Age of Onset  . Diabetes Mellitus II Mother   . CAD Mother   . CAD Brother    Scheduled Meds: . Chlorhexidine Gluconate Cloth  6 each Topical Q0600  . diltiazem  180 mg Oral Daily  . ipratropium  0.5 mg Nebulization Q6H  . levalbuterol  0.63 mg Nebulization Q6H  . methylPREDNISolone (SOLU-MEDROL) injection  40 mg Intravenous BID  . mupirocin ointment  1 application Nasal BID  . piperacillin-tazobactam (ZOSYN)  IV  3.375 g Intravenous 3 times per day  . sodium chloride  3 mL Intravenous Q12H  . vancomycin  750 mg Intravenous Q12H  . warfarin  5 mg Oral ONCE-1800  . Warfarin - Pharmacist Dosing Inpatient   Does not apply q1800   Continuous Infusions: . heparin 1,150 Units/hr (09/30/15 0551)   PRN Meds:.acetaminophen **OR** acetaminophen, levalbuterol, ondansetron **OR** ondansetron (ZOFRAN) IV, oxyCODONE Medications Prior to Admission:  Prior to Admission medications   Medication Sig Start Date End Date Taking? Authorizing Provider  albuterol (PROVENTIL) (2.5 MG/3ML) 0.083% nebulizer solution Take 3 mLs (2.5 mg total) by nebulization every 2 (two) hours as needed for wheezing or shortness of breath. 09/07/15  Yes Richarda Overlie, MD  arformoterol (BROVANA) 15 MCG/2ML NEBU Take 2 mLs (15 mcg total) by nebulization 2 (two) times daily. 09/07/15  Yes Richarda Overlie, MD    diltiazem (CARDIZEM CD) 180 MG 24 hr capsule Take 1 capsule (180 mg total) by mouth daily. 09/07/15  Yes Richarda Overlie, MD  Fluticasone-Salmeterol (ADVAIR) 500-50 MCG/DOSE AEPB Inhale 1 puff into the lungs 2 (two) times daily.   Yes Historical Provider, MD  furosemide (LASIX) 40 MG tablet Take 40 mg by mouth daily.   Yes Historical Provider, MD  predniSONE (DELTASONE) 10 MG tablet Take 10 mg by mouth daily with breakfast.   Yes Historical Provider, MD  SPIRIVA HANDIHALER 18 MCG inhalation capsule Place 18 mcg into inhaler and inhale daily.  08/24/15  Yes Historical Provider, MD  warfarin (COUMADIN) 5 MG tablet Take 2.5 mg by mouth daily.   Yes Historical Provider, MD  apixaban (ELIQUIS) 5 MG TABS tablet Take 1 tablet (5 mg total) by mouth 2 (two) times daily. Patient not taking: Reported on 09/28/2015 09/14/15   Leroy Sea, MD  budesonide (PULMICORT) 0.25 MG/2ML nebulizer solution Take 2 mLs (0.25 mg total) by nebulization 2 (two) times daily. Patient not taking: Reported on 09/28/2015 09/07/15   Richarda Overlie, MD  guaiFENesin (MUCINEX) 600 MG 12 hr tablet Take 2 tablets (1,200 mg total) by mouth 2 (two) times daily. Patient not taking: Reported on 09/28/2015 09/07/15   Richarda Overlie, MD  predniSONE (DELTASONE) 5 MG tablet Label  & dispense according to the schedule below. 10 Pills PO for 3 days then, 8 Pills PO for 3 days, 6 Pills PO for 3 days, 4 Pills PO for 3 days, 2 Pills PO for 3 days, 1 Pills PO for 3 days, 1/2 Pill  PO for 3 days then STOP. Total 95 pills. Patient not taking: Reported on 09/28/2015 09/14/15   Leroy Sea, MD   No Known Allergies  Review of Systems  Constitutional: Positive for activity change.  HENT: Positive for congestion.   Eyes: Positive for visual disturbance.  Respiratory: Positive for cough and shortness of breath.   Cardiovascular: Positive for leg swelling.  Gastrointestinal: Negative.   Endocrine: Negative.   Genitourinary: Negative.    Musculoskeletal: Negative.   Skin: Positive for color change.  Allergic/Immunologic: Negative.   Neurological: Negative.   Hematological: Negative.   Psychiatric/Behavioral: Positive for sleep disturbance.    Physical Exam General:  Very pleasant, well developed elderly female, sitting on the side of the bed.  Has increased work of breathing when speaking Neck:  Supple, No obvious lymphadenopathy  Chest:  Decreased breath sounds, moving very little air.  Increased work of breathing even at rest. CV:  Tachycardic Abdomen:  Soft, nt, nd, +bs Extremities:  Area of dependent fluid accumulation under left forearm, multiple abrasions on UE.  No edema noted in lower extremities.  Vital Signs: BP 105/59 mmHg  Pulse 96  Temp(Src) 98.2 F (36.8 C) (Oral)  Resp 13  Ht 5\' 5"  (1.651 m)  Wt 63.866 kg (140 lb 12.8 oz)  BMI 23.43 kg/m2  SpO2 97%  SpO2: SpO2: 97 % O2 Device:SpO2: 97 % O2 Flow Rate: .O2 Flow Rate (L/min): 4 L/min  IO: Intake/output summary:  Intake/Output Summary (Last 24 hours) at 09/30/15 1136 Last data filed at 09/30/15 8295  Gross per 24 hour  Intake    480 ml  Output   2500 ml  Net  -2020 ml    LBM: Last BM Date: 09/28/15 Baseline Weight: Weight: 60.782 kg (134 lb) Most recent weight: Weight: 63.866 kg (140 lb 12.8 oz)      Palliative Assessment/Data:  Flowsheet Rows        Most Recent Value   Intake Tab    Referral Department  Hospitalist   Unit at Time of Referral  Cardiac/Telemetry Unit   Palliative Care Primary Diagnosis  Pulmonary   Date Notified  09/29/15   Palliative Care Type  New Palliative care   Reason for referral  Non-pain Symptom, Advance Care Planning   Date of Admission  09/28/15   Date first seen by Palliative Care  09/30/15   # of days Palliative referral response time  1 Day(s)   # of days IP prior to Palliative referral  1   Clinical Assessment    Palliative Performance Scale Score  40%   Pain Max last 24 hours  0   Pain Min Last 24  hours  0   Dyspnea Max Last 24 Hours  6   Dyspnea Min Last 24 hours  3   Psychosocial & Spiritual Assessment    Social Work Plan of Care  Clarified patient/family wishes with healthcare team   Palliative Care Outcomes    Patient/Family meeting held?  No   Palliative Care Outcomes  Clarified goals of care, Counseled regarding hospice, Improved non-pain symptom therapy   Patient/Family wishes: Interventions discontinued/not started   Mechanical Ventilation   Palliative Care follow-up planned  Yes, Facility      Additional Data Reviewed:  CBC:    Component Value Date/Time   WBC 11.4* 09/30/2015 0225   HGB 7.4* 09/30/2015 0225   HCT 25.0* 09/30/2015 0225   PLT 456* 09/30/2015 0225   MCV 79.6 09/30/2015 0225   NEUTROABS 8.0* 09/29/2015 0513   LYMPHSABS 0.3* 09/29/2015 0513   MONOABS 0.0* 09/29/2015 0513   EOSABS 0.0 09/29/2015 0513   BASOSABS 0.0 09/29/2015 0513   Comprehensive Metabolic Panel:    Component Value Date/Time   NA 138 09/30/2015 0225   K 4.4 09/30/2015 0225   CL 101 09/30/2015 0225   CO2 29 09/30/2015 0225   BUN 15 09/30/2015 0225   CREATININE 0.69 09/30/2015 0225   GLUCOSE 206* 09/30/2015 0225   CALCIUM 8.6* 09/30/2015 0225   AST 21 09/29/2015 0513   ALT 14 09/29/2015 0513   ALKPHOS 61 09/29/2015 0513   BILITOT 0.4 09/29/2015 0513   PROT 5.5* 09/29/2015 0513   ALBUMIN 2.4* 09/29/2015 0513     Time In: 10:30 Time Out: 11:44 Time Total: 74 min Greater than 50%  of this  time was spent counseling and coordinating care related to the above assessment and plan.  Discussed with my Attending physician and Dr. Catha Gosselin of [facility].  Signed by:  Stephani Police, PA-C  09/30/2015, 11:36 AM  Please contact Palliative Medicine Team phone at [phone] for questions and concerns.

## 2015-09-30 NOTE — Progress Notes (Signed)
[facility]                                                                              Patient Demographics  Brenda Wells, is a 75 y.o. female, DOB - Feb 21, 1941, WUJ:[id number]  Admit date - 09/28/2015   Admitting Physician Eduard ClosArshad N Kakrakandy, MD  Outpatient Primary MD for the patient is [name],[name], MD  LOS - 2   Chief Complaint  Patient presents with  . Shortness of Breath      HPI on 09/29/2015 by Dr. Midge MiniumArshad Kakrakandy Brenda Wells is a 75 y.o. female history of COPD on home oxygen 4 L nasal cannula, diastolic CHF, chronic atrial fibrillation, history of DVT presents to the ER because of sudden onset of shortness of breath since yesterday afternoon after 4 PM. Denies any chest pain. Has been having some cough. Denies any fever or chills. Chest x-ray in the ER shows mild left pleural effusion. Patient was wheezing and required nebulizer treatment. Patient's heart rate on my exam has increased and was in A. fib with RVR. Patient denies any nausea vomiting or diarrhea. Lactic acid was elevated and patient was given 500 mL normal saline bolus. Patient will be admitted for acute respiratory failure probably from COPD exacerbation with possible developing sepsis.   Assessment & Plan  Acute on chronic respiratory failure/COPD exacerbation/possible sepsis -Continue nebs, Pulmicort, IV steroids, antibiotics-vancomycin/Zosyn -Lactic acid trending downward, 2.8 today -Chest x-ray showed stable advanced emphysema with hyperinflation, small left pleural effusion without edema or focal infiltrate -CTA chest negative for pulmonary embolism or acute disease -Urine and blood cultures show no growth to date -Repeat CXR ordered for 1/13  Atrial fibrillation with RVR -Likely secondary to the above -Continue Coumadin/heparin per pharmacy (heparin added as INR was 1.2) -Continue Cardizem  Elevated troponin -Peaked at 0.15, continue to trend -Likely secondary to demand ischemia and  Afib RVR -If continues to trend upward, will consult cardiology -Currently on heparin  History of DVT -Continue Coumadin per pharmacy  Chronic diastolic heart failure -Echocardiogram 07/22/2015 shows an EF of 50-55% -Currently appears to be euvolemic -continue to monitor intake and output, daily weights  Chronic anemia -Hemoglobin currently 7.4, baseline appears to be an 8-9 -Continue to monitor CBC -If hb continues to drop, will transfuse -Will order anemia panel and FOBT  Goals of care -Patient has been admitted 4 times in 2 months for COPD exac -Palliative care consulted and appreciated  Code Status: DNR  Family Communication: None at bedside  Disposition Plan: Admitted. Pending improvement in respiratory status.  Monitoring trops.  Time Spent in minutes 30 minutes  Procedures  None  Consults  Palliative care  DVT Prophylaxis Coumadin  Lab Results  Component Value Date   PLT 456* 09/30/2015    Medications  Scheduled Meds: . Chlorhexidine Gluconate Cloth  6 each Topical Q0600  . diltiazem  180 mg Oral Daily  . ipratropium  0.5 mg Nebulization Q6H  . levalbuterol  0.63 mg Nebulization Q6H  . methylPREDNISolone (SOLU-MEDROL) injection  40 mg Intravenous BID  . mupirocin ointment  1 application Nasal BID  . piperacillin-tazobactam (ZOSYN)  IV  3.375 g Intravenous 3 times per day  .  sodium chloride  3 mL Intravenous Q12H  . vancomycin  750 mg Intravenous Q12H  . warfarin  5 mg Oral ONCE-1800  . Warfarin - Pharmacist Dosing Inpatient   Does not apply q1800   Continuous Infusions: . heparin 1,150 Units/hr (09/30/15 0551)   PRN Meds:.acetaminophen **OR** acetaminophen, levalbuterol, ondansetron **OR** ondansetron (ZOFRAN) IV, oxyCODONE  Antibiotics    Anti-infectives    Start     Dose/Rate Route Frequency Ordered Stop   09/29/15 1800  vancomycin (VANCOCIN) IVPB 750 mg/150 ml premix     750 mg 150 mL/hr over 60 Minutes Intravenous Every 12 hours  09/29/15 0629     09/29/15 0645  vancomycin (VANCOCIN) IVPB 1000 mg/200 mL premix     1,000 mg 200 mL/hr over 60 Minutes Intravenous  Once 09/29/15 0629 09/29/15 1147   09/29/15 0630  piperacillin-tazobactam (ZOSYN) IVPB 3.375 g     3.375 g 12.5 mL/hr over 240 Minutes Intravenous 3 times per day 09/29/15 0629     09/29/15 0430  levofloxacin (LEVAQUIN) IVPB 750 mg  Status:  Discontinued    Comments:  Levaquin 750 mg IV q24h for CrCl> 50 mL/min   750 mg 100 mL/hr over 90 Minutes Intravenous Daily 09/29/15 0400 09/29/15 5409      Subjective:   Brenda Wells seen and examined today.  Patient states she is improving, but has not moved or attempted to get out of bed.  Denies chest pain, abdominal pain, N/V/D/C.  Feels her breathing is fine until she moves.  Objective:   Filed Vitals:   09/30/15 0309 09/30/15 0500 09/30/15 0824 09/30/15 0848  BP:  105/59    Pulse:  96    Temp:  97.9 F (36.6 C) 98.2 F (36.8 C)   TempSrc:  Oral Oral   Resp:  13    Height:      Weight:  63.866 kg (140 lb 12.8 oz)    SpO2: 98% 95%  97%    Wt Readings from Last 3 Encounters:  09/30/15 63.866 kg (140 lb 12.8 oz)  09/12/15 65.2 kg (143 lb 11.8 oz)  09/07/15 59.966 kg (132 lb 3.2 oz)     Intake/Output Summary (Last 24 hours) at 09/30/15 1233 Last data filed at 09/30/15 8119  Gross per 24 hour  Intake    480 ml  Output   2500 ml  Net  -2020 ml    Exam  General: Well developed, chronically ill, NAD  HEENT: NCAT,  mucous membranes moist.   Cardiovascular: S1 S2 auscultated, RRR  Respiratory: Diffuse exp wheezing  Abdomen: Soft, nontender, nondistended, + bowel sounds  Extremities: warm dry without cyanosis clubbing or edema  Neuro: AAOx3, nonfocal  Psych: Normal affect and demeanor  Data Review   Micro Results Recent Results (from the past 240 hour(s))  Culture, blood (routine x 2)     Status: None (Preliminary result)   Collection Time: 09/28/15  7:30 PM  Result Value Ref  Range Status   Specimen Description BLOOD RIGHT ANTECUBITAL  Final   Special Requests BOTTLES DRAWN AEROBIC AND ANAEROBIC 5CC  Final   Culture NO GROWTH 2 DAYS  Final   Report Status PENDING  Incomplete  Culture, blood (routine x 2)     Status: None (Preliminary result)   Collection Time: 09/28/15  7:45 PM  Result Value Ref Range Status   Specimen Description BLOOD LEFT ANTECUBITAL  Final   Special Requests BOTTLES DRAWN AEROBIC AND ANAEROBIC 5CC  Final   Culture NO GROWTH  2 DAYS  Final   Report Status PENDING  Incomplete  Urine culture     Status: None   Collection Time: 09/28/15  9:32 PM  Result Value Ref Range Status   Specimen Description URINE, CATHETERIZED  Final   Special Requests NONE  Final   Culture NO GROWTH 2 DAYS  Final   Report Status 09/30/2015 FINAL  Final    Radiology Reports Dg Chest 2 View  09/28/2015  CLINICAL DATA:  Shortness of breath, weakness and history of COPD. EXAM: CHEST - 2 VIEW COMPARISON:  09/13/2015 FINDINGS: Stable advanced emphysematous lung disease with moderate bilateral hyperinflation. There is no evidence of pulmonary edema, consolidation, pneumothorax or nodule. Tiny left pleural effusion present. Stable top-normal heart size. IMPRESSION: Stable advanced emphysema with hyperinflation. Small left pleural effusion without overt pulmonary edema or focal infiltrate. Electronically Signed   By: Irish Lack M.D.   On: 09/28/2015 20:39   Ct Angio Chest Pe W/cm &/or Wo Cm  09/29/2015  CLINICAL DATA:  Patient with a DVT in the right lower extremity. History of COPD. Worsening shortness of breath. Initial encounter. EXAM: CT ANGIOGRAPHY CHEST WITH CONTRAST TECHNIQUE: Multidetector CT imaging of the chest was performed using the standard protocol during bolus administration of intravenous contrast. Multiplanar CT image reconstructions and MIPs were obtained to evaluate the vascular anatomy. CONTRAST:  80 mL OMNIPAQUE IOHEXOL 350 MG/ML SOLN COMPARISON:  CT  chest 07/22/2015.  PA and lateral chest 09/28/2015. FINDINGS: No pulmonary embolus is identified. Extensive aortic and coronary atherosclerosis is seen. No pathologic lymphadenopathy by CT size criteria in the axilla, hila or mediastinum is identified. Very small bilateral pleural effusions are seen and decreased since the prior CT. Heart size is mildly enlarged. The lungs demonstrate extensive emphysematous change. Chronic atelectasis in the left lower lobe is unchanged. Volume loss in the right middle lobe seen on the prior CT has resolved. The lungs are otherwise clear. Visualized upper abdomen shows an unchanged low-attenuation lesion in the caudate lobe of the liver most consistent with a cyst. No focal bony abnormality is identified. Review of the MIP images confirms the above findings. IMPRESSION: Negative for pulmonary embolus or acute disease. Trace bilateral pleural effusions are decreased since the prior CT. Emphysema. Mild cardiomegaly. Calcific aortic and coronary atherosclerosis. Electronically Signed   By: Drusilla Kanner M.D.   On: 09/29/2015 09:01   Ct Abdomen Pelvis W Contrast  09/03/2015  CLINICAL DATA:  Distended abdomen.  Hematuria. EXAM: CT ABDOMEN AND PELVIS WITH CONTRAST TECHNIQUE: Multidetector CT imaging of the abdomen and pelvis was performed using the standard protocol following bolus administration of intravenous contrast. CONTRAST:  OMNIPAQUE IOHEXOL 300 MG/ML  SOLN COMPARISON:  07/08/2015 FINDINGS: Lower chest: Patchy airspace densities are noted in the lung bases bilaterally. No pleural fluid. Hepatobiliary: Again noted are multiple liver cysts. Previous cholecystectomy. No biliary dilatation. Pancreas: The pancreas is negative. Spleen: Normal appearance of the spleen. Adrenals/Urinary Tract: The adrenal glands are normal. Indeterminate intermediate attenuating lesion arising from the inferior pole of the left kidney is again noted measuring 2.2 cm, image 38 of series 2. No  kidney stones or obstructive uropathy identified. Bilateral renal vascular calcifications are noted. Urinary bladder is collapsed around a Foley catheter. No bladder calculi noted. Stomach/Bowel: Moderate distension of the stomach. The small bowel loops have a normal course and caliber without obstruction. Moderate stool burden identified throughout the colon. No pathologic dilatation of the large bowel loops identified. Vascular/Lymphatic: Calcified atherosclerotic disease involves the abdominal  aorta. The abdominal aorta has a maximum AP dimension of 2.7 cm. No enlarged retroperitoneal or mesenteric adenopathy. No enlarged pelvic or inguinal lymph nodes. Reproductive: Previous hysterectomy.  No adnexal mass noted. Other: No free fluid or fluid collections identified. Musculoskeletal: Degenerative disc disease is noted within the lumbar spine. The bones appear osteopenic. No fractures identified. IMPRESSION: 1. Moderate distension of the gastric lumen. No pathologic dilatation of the small or large bowel loops noted. 2. No findings to explain patient's hematuria. 3. Aortic atherosclerosis. Ectatic abdominal aorta at risk for aneurysm development. Recommend followup by ultrasound in 5 years. This recommendation follows ACR consensus guidelines: White Paper of the ACR Incidental Findings Committee II on Vascular Findings. J Am Coll Radiol 2013; 10:789-794. 4. Indeterminate intermediate attenuating lesion arises from the inferior pole of left kidney. This may represent a hemorrhagic or proteinaceous cyst. A small enhancing renal lesion cannot be excluded. Recommend followup imaging with non-emergent renal protocol MRI or CT. 5. Mild bilateral lower lobe airspace disease. Electronically Signed   By: Signa Kell M.D.   On: 09/03/2015 18:36   Dg Chest Port 1 View  09/13/2015  CLINICAL DATA:  COPD.  Follow-up shortness of breath and cough. EXAM: PORTABLE CHEST 1 VIEW COMPARISON:  September 09, 2015 FINDINGS: The  heart size and mediastinal contours are stable. The heart size is enlarged. There is a small left pleural effusion. There is no focal pneumonia or pulmonary edema. The lungs are hyperinflated. The visualized skeletal structures are stable. IMPRESSION: No focal pneumonia.  Small left pleural effusion.  Emphysema. Electronically Signed   By: Sherian Rein M.D.   On: 09/13/2015 08:27   Dg Chest Port 1 View  09/09/2015  CLINICAL DATA:  Shortness of breath today. Patient discharge from the hospital 09/07/2015. EXAM: PORTABLE CHEST 1 VIEW COMPARISON:  CT chest 07/22/2015. Single view of the chest 09/03/2015. FINDINGS: The lungs are emphysematous but clear. Heart size is enlarged. No pneumothorax or pleural effusion. Atherosclerosis noted. IMPRESSION: Emphysema without acute disease. Cardiomegaly. Atherosclerosis. Electronically Signed   By: Drusilla Kanner M.D.   On: 09/09/2015 14:15   Dg Chest Port 1 View  09/03/2015  CLINICAL DATA:  Increasing shortness of breath EXAM: PORTABLE CHEST 1 VIEW COMPARISON:  09/02/2015 FINDINGS: Moderate cardiac enlargement. Aortic atherosclerosis. Mild interstitial edema is identified. No airspace consolidation. IMPRESSION: 1. Cardiac enlargement and mild edema. Electronically Signed   By: Signa Kell M.D.   On: 09/03/2015 19:46   Dg Chest Port 1 View  09/02/2015  CLINICAL DATA:  Shortness of breath today.  History of COPD. EXAM: PORTABLE CHEST 1 VIEW COMPARISON:  CT chest 07/22/2015.  Chest 07/21/2015. FINDINGS: Mild cardiac enlargement. Pulmonary vascularity is normal. Emphysematous changes in the lungs. Atelectasis in the lung bases. Fluid or thickened pleura in the left costophrenic angle. No focal consolidation. No pneumothorax. Mediastinal contours appear intact. Calcified aorta. IMPRESSION: Cardiac enlargement. Emphysematous changes in the lungs. Small pleural effusion or pleural thickening on the left. No focal airspace disease or consolidation in the lungs.  Electronically Signed   By: Burman Nieves M.D.   On: 09/02/2015 01:17    CBC  Recent Labs Lab 09/28/15 1921 09/29/15 0513 09/30/15 0225  WBC 9.2 8.3 11.4*  HGB 9.1* 7.8* 7.4*  HCT 31.1* 26.1* 25.0*  PLT 452* 415* 456*  MCV 81.2 80.6 79.6  MCH 23.8* 24.1* 23.6*  MCHC 29.3* 29.9* 29.6*  RDW 19.6* 19.6* 19.4*  LYMPHSABS 1.8 0.3*  --   MONOABS 0.6 0.0*  --  EOSABS 0.0 0.0  --   BASOSABS 0.0 0.0  --     Chemistries   Recent Labs Lab 09/28/15 1921 09/29/15 0513 09/30/15 0225  NA 141 138 138  K 3.8 4.0 4.4  CL 95* 97* 101  CO2 33* 29 29  GLUCOSE 161* 309* 206*  BUN 18 15 15   CREATININE 0.83 0.82 0.69  CALCIUM 9.0 8.5* 8.6*  AST 18 21  --   ALT 13* 14  --   ALKPHOS 69 61  --   BILITOT 0.1* 0.4  --    ------------------------------------------------------------------------------------------------------------------ estimated creatinine clearance is 56.4 mL/min (by C-G formula based on Cr of 0.69). ------------------------------------------------------------------------------------------------------------------ No results for input(s): HGBA1C in the last 72 hours. ------------------------------------------------------------------------------------------------------------------ No results for input(s): CHOL, HDL, LDLCALC, TRIG, CHOLHDL, LDLDIRECT in the last 72 hours. ------------------------------------------------------------------------------------------------------------------ No results for input(s): TSH, T4TOTAL, T3FREE, THYROIDAB in the last 72 hours.  Invalid input(s): FREET3 ------------------------------------------------------------------------------------------------------------------ No results for input(s): VITAMINB12, FOLATE, FERRITIN, TIBC, IRON, RETICCTPCT in the last 72 hours.  Coagulation profile  Recent Labs Lab 09/29/15 0513 09/30/15 0225  INR 1.20 1.20    No results for input(s): DDIMER in the last 72 hours.  Cardiac Enzymes  Recent  Labs Lab 09/29/15 1009 09/29/15 1620 09/30/15 0830  TROPONINI <0.03 0.05* 0.15*   ------------------------------------------------------------------------------------------------------------------ Invalid input(s): POCBNP    Moriah Loughry D.O. on 09/30/2015 at 12:33 PM  Between 7am to 7pm - Pager - [phone]  After 7pm go to [email or web address] - password [email or web address]  And look for the night coverage person covering for me after hours  [facility] Office  [phone]

## 2015-09-30 NOTE — Plan of Care (Signed)
Problem: Education: Goal: Knowledge of [facility] Education information/materials will improve Outcome: Progressing Pt educated about current treatment with AB , steroids and nebulizer. Pt familiar with the treatment. All questions answered.

## 2015-10-01 ENCOUNTER — Inpatient Hospital Stay (HOSPITAL_COMMUNITY): Payer: Medicare Other

## 2015-10-01 LAB — CBC
HEMATOCRIT: 25.7 % — AB (ref 36.0–46.0)
HEMOGLOBIN: 7.6 g/dL — AB (ref 12.0–15.0)
MCH: 23.4 pg — ABNORMAL LOW (ref 26.0–34.0)
MCHC: 29.6 g/dL — ABNORMAL LOW (ref 30.0–36.0)
MCV: 79.1 fL (ref 78.0–100.0)
Platelets: 486 10*3/uL — ABNORMAL HIGH (ref 150–400)
RBC: 3.25 MIL/uL — AB (ref 3.87–5.11)
RDW: 19.1 % — ABNORMAL HIGH (ref 11.5–15.5)
WBC: 9.8 10*3/uL (ref 4.0–10.5)

## 2015-10-01 LAB — BASIC METABOLIC PANEL
ANION GAP: 6 (ref 5–15)
BUN: 22 mg/dL — AB (ref 6–20)
CHLORIDE: 98 mmol/L — AB (ref 101–111)
CO2: 31 mmol/L (ref 22–32)
Calcium: 8.6 mg/dL — ABNORMAL LOW (ref 8.9–10.3)
Creatinine, Ser: 0.9 mg/dL (ref 0.44–1.00)
GFR calc non Af Amer: >60 mL/min (ref >60)
GLUCOSE: 248 mg/dL — AB (ref 65–99)
POTASSIUM: 4.5 mmol/L (ref 3.5–5.1)
SODIUM: 135 mmol/L (ref 135–145)

## 2015-10-01 LAB — LACTIC ACID, PLASMA: LACTIC ACID, VENOUS: 3.4 mmol/L — AB (ref 0.5–2.0)

## 2015-10-01 LAB — HEPARIN LEVEL (UNFRACTIONATED): Heparin Unfractionated: 0.47 IU/mL (ref 0.30–0.70)

## 2015-10-01 LAB — PROTIME-INR
INR: 1.35 (ref 0.00–1.49)
Prothrombin Time: 16.7 seconds — ABNORMAL HIGH (ref 11.6–15.2)

## 2015-10-01 MED ORDER — CYANOCOBALAMIN 1000 MCG/ML IJ SOLN
1000.0000 ug | Freq: Once | INTRAMUSCULAR | Status: AC
  Administered 2015-10-01: 1000 ug via INTRAMUSCULAR
  Filled 2015-10-01: qty 1

## 2015-10-01 MED ORDER — APIXABAN 5 MG PO TABS
5.0000 mg | ORAL_TABLET | Freq: Two times a day (BID) | ORAL | Status: DC
  Administered 2015-10-01 – 2015-10-04 (×7): 5 mg via ORAL
  Filled 2015-10-01 (×7): qty 1

## 2015-10-01 NOTE — Progress Notes (Signed)
CRITICAL VALUE ALERT  Critical value received:  Lactic acid 3.4  Date of notification:  10-01-15  Time of notification: 0815  Critical value read back:yes  Nurse who received alert:  Levada Dyhrissy Christy Friede, RN  MD notified (1st page):  Dr. Catha GosselinMikhail  Time of first page: 0815  MD notified (2nd page):  Time of second page:  Responding MD:  Dr. Catha GosselinMikhail  Time MD responded:  Through text paged

## 2015-10-01 NOTE — Progress Notes (Signed)
[facility]                                                                              Patient Demographics  Brenda Wells, is a 75 y.o. female, DOB - 10-19-1940, ZOX:[id number]  Admit date - 09/28/2015   Admitting Physician Eduard Clos, MD  Outpatient Primary MD for the patient is [name],[name], MD  LOS - 3   Chief Complaint  Patient presents with  . Shortness of Breath      HPI on 09/29/2015 by Dr. Midge Minium [name] is a 75 y.o. female history of COPD on home oxygen 4 L nasal cannula, diastolic CHF, chronic atrial fibrillation, history of DVT presents to the ER because of sudden onset of shortness of breath since yesterday afternoon after 4 PM. Denies any chest pain. Has been having some cough. Denies any fever or chills. Chest x-ray in the ER shows mild left pleural effusion. Patient was wheezing and required nebulizer treatment. Patient's heart rate on my exam has increased and was in A. fib with RVR. Patient denies any nausea vomiting or diarrhea. Lactic acid was elevated and patient was given 500 mL normal saline bolus. Patient will be admitted for acute respiratory failure probably from COPD exacerbation with possible developing sepsis.   Assessment & Plan  Acute on chronic respiratory failure/COPD exacerbation/possible sepsis -Continue nebs, Pulmicort, IV steroids, antibiotics-vancomycin/Zosyn -Lactic acid trending downward -Chest x-ray showed stable advanced emphysema with hyperinflation, small left pleural effusion without edema or focal infiltrate -CTA chest negative for pulmonary embolism or acute disease -Urine and blood cultures show no growth to date -Repeat CXR pendnig -PT consulted for eval and treatment  Atrial fibrillation with RVR -Likely secondary to the above -Patient more rate controlled -Continue Coumadin/heparin per pharmacy (INR 1.35) -Continue Cardizem -patient needs to switch to Eliquis   Elevated troponin -Peaked at  0.15, trended and remaining fairly flat -Likely secondary to demand ischemia and Afib RVR -Currently on heparin  History of DVT -Continue Coumadin per pharmacy  Chronic diastolic heart failure -Echocardiogram 07/22/2015 shows an EF of 50-55% -Currently appears to be euvolemic -continue to monitor intake and output, daily weights  Chronic anemia -Hemoglobin currently 7.6, baseline appears to be an 8-9 -Continue to monitor CBC -Anemia panel: Iron 25, Ferritin 37, B12 189 -FOBT pending  Vitamin B12 def -B12 189 -Will replace today  Goals of care -Patient has been admitted 4 times in 2 months for COPD exac -Palliative care consulted and appreciated  Code Status: DNR  Family Communication: None at bedside  Disposition Plan: Admitted. Pending improvement in respiratory status.  Pending PT  Time Spent in minutes 30 minutes  Procedures  None  Consults  Palliative care  DVT Prophylaxis Coumadin  Lab Results  Component Value Date   PLT 486* 10/01/2015    Medications  Scheduled Meds: . Chlorhexidine Gluconate Cloth  6 each Topical Q0600  . diltiazem  180 mg Oral Daily  . ipratropium  0.5 mg Nebulization Q6H  . levalbuterol  0.63 mg Nebulization Q6H  . methylPREDNISolone (SOLU-MEDROL) injection  40 mg Intravenous BID  . mupirocin ointment  1 application Nasal BID  . piperacillin-tazobactam (ZOSYN)  IV  3.375 g Intravenous 3 times per day  . sodium chloride  3 mL Intravenous Q12H  . vancomycin  750 mg Intravenous Q12H  . Warfarin - Pharmacist Dosing Inpatient   Does not apply q1800   Continuous Infusions: . heparin 1,150 Units/hr (10/01/15 0514)   PRN Meds:.acetaminophen **OR** acetaminophen, levalbuterol, ondansetron **OR** ondansetron (ZOFRAN) IV, oxyCODONE  Antibiotics    Anti-infectives    Start     Dose/Rate Route Frequency Ordered Stop   09/29/15 1800  vancomycin (VANCOCIN) IVPB 750 mg/150 ml premix     750 mg 150 mL/hr over 60 Minutes  Intravenous Every 12 hours 09/29/15 0629     09/29/15 0645  vancomycin (VANCOCIN) IVPB 1000 mg/200 mL premix     1,000 mg 200 mL/hr over 60 Minutes Intravenous  Once 09/29/15 0629 09/29/15 1147   09/29/15 0630  piperacillin-tazobactam (ZOSYN) IVPB 3.375 g     3.375 g 12.5 mL/hr over 240 Minutes Intravenous 3 times per day 09/29/15 0629     09/29/15 0430  levofloxacin (LEVAQUIN) IVPB 750 mg  Status:  Discontinued    Comments:  Levaquin 750 mg IV q24h for CrCl> 50 mL/min   750 mg 100 mL/hr over 90 Minutes Intravenous Daily 09/29/15 0400 09/29/15 1610      Subjective:   Brenda Wells seen and examined today.  Patient feels her breathing is improving.  Denies chest pain, abdominal pain, N/V/D/C.    Objective:   Filed Vitals:   10/01/15 0515 10/01/15 0805 10/01/15 0808 10/01/15 0831  BP: 112/62 127/73    Pulse: 101     Temp: 97.7 F (36.5 C)  98 F (36.7 C)   TempSrc: Oral  Oral   Resp: 24     Height:      Weight: 64.411 kg (142 lb)     SpO2: 92%   94%    Wt Readings from Last 3 Encounters:  10/01/15 64.411 kg (142 lb)  09/12/15 65.2 kg (143 lb 11.8 oz)  09/07/15 59.966 kg (132 lb 3.2 oz)     Intake/Output Summary (Last 24 hours) at 10/01/15 1243 Last data filed at 10/01/15 1026  Gross per 24 hour  Intake    920 ml  Output   3925 ml  Net  -3005 ml    Exam  General: Well developed, chronically ill, NAD  HEENT: NCAT,  mucous membranes moist.   Cardiovascular: S1 S2 auscultated, RRR  Respiratory: diminished breath sounds  Abdomen: Soft, nontender, nondistended, + bowel sounds  Extremities: warm dry without cyanosis clubbing or edema  Neuro: AAOx3, nonfocal  Psych: Normal affect and demeanor, pleasant  Data Review   Micro Results Recent Results (from the past 240 hour(s))  Culture, blood (routine x 2)     Status: None (Preliminary result)   Collection Time: 09/28/15  7:30 PM  Result Value Ref Range Status   Specimen Description BLOOD RIGHT ANTECUBITAL   Final   Special Requests BOTTLES DRAWN AEROBIC AND ANAEROBIC 5CC  Final   Culture NO GROWTH 2 DAYS  Final   Report Status PENDING  Incomplete  Culture, blood (routine x 2)     Status: None (Preliminary result)   Collection Time: 09/28/15  7:45 PM  Result Value Ref Range Status   Specimen Description BLOOD LEFT ANTECUBITAL  Final   Special Requests BOTTLES DRAWN AEROBIC AND ANAEROBIC 5CC  Final   Culture NO GROWTH 2 DAYS  Final   Report Status PENDING  Incomplete  Urine culture     Status:  None   Collection Time: 09/28/15  9:32 PM  Result Value Ref Range Status   Specimen Description URINE, CATHETERIZED  Final   Special Requests NONE  Final   Culture NO GROWTH 2 DAYS  Final   Report Status 09/30/2015 FINAL  Final    Radiology Reports Dg Chest 2 View  09/28/2015  CLINICAL DATA:  Shortness of breath, weakness and history of COPD. EXAM: CHEST - 2 VIEW COMPARISON:  09/13/2015 FINDINGS: Stable advanced emphysematous lung disease with moderate bilateral hyperinflation. There is no evidence of pulmonary edema, consolidation, pneumothorax or nodule. Tiny left pleural effusion present. Stable top-normal heart size. IMPRESSION: Stable advanced emphysema with hyperinflation. Small left pleural effusion without overt pulmonary edema or focal infiltrate. Electronically Signed   By: Irish Lack M.D.   On: 09/28/2015 20:39   Ct Angio Chest Pe W/cm &/or Wo Cm  09/29/2015  CLINICAL DATA:  Patient with a DVT in the right lower extremity. History of COPD. Worsening shortness of breath. Initial encounter. EXAM: CT ANGIOGRAPHY CHEST WITH CONTRAST TECHNIQUE: Multidetector CT imaging of the chest was performed using the standard protocol during bolus administration of intravenous contrast. Multiplanar CT image reconstructions and MIPs were obtained to evaluate the vascular anatomy. CONTRAST:  80 mL OMNIPAQUE IOHEXOL 350 MG/ML SOLN COMPARISON:  CT chest 07/22/2015.  PA and lateral chest 09/28/2015. FINDINGS: No  pulmonary embolus is identified. Extensive aortic and coronary atherosclerosis is seen. No pathologic lymphadenopathy by CT size criteria in the axilla, hila or mediastinum is identified. Very small bilateral pleural effusions are seen and decreased since the prior CT. Heart size is mildly enlarged. The lungs demonstrate extensive emphysematous change. Chronic atelectasis in the left lower lobe is unchanged. Volume loss in the right middle lobe seen on the prior CT has resolved. The lungs are otherwise clear. Visualized upper abdomen shows an unchanged low-attenuation lesion in the caudate lobe of the liver most consistent with a cyst. No focal bony abnormality is identified. Review of the MIP images confirms the above findings. IMPRESSION: Negative for pulmonary embolus or acute disease. Trace bilateral pleural effusions are decreased since the prior CT. Emphysema. Mild cardiomegaly. Calcific aortic and coronary atherosclerosis. Electronically Signed   By: Drusilla Kanner M.D.   On: 09/29/2015 09:01   Ct Abdomen Pelvis W Contrast  09/03/2015  CLINICAL DATA:  Distended abdomen.  Hematuria. EXAM: CT ABDOMEN AND PELVIS WITH CONTRAST TECHNIQUE: Multidetector CT imaging of the abdomen and pelvis was performed using the standard protocol following bolus administration of intravenous contrast. CONTRAST:  OMNIPAQUE IOHEXOL 300 MG/ML  SOLN COMPARISON:  07/08/2015 FINDINGS: Lower chest: Patchy airspace densities are noted in the lung bases bilaterally. No pleural fluid. Hepatobiliary: Again noted are multiple liver cysts. Previous cholecystectomy. No biliary dilatation. Pancreas: The pancreas is negative. Spleen: Normal appearance of the spleen. Adrenals/Urinary Tract: The adrenal glands are normal. Indeterminate intermediate attenuating lesion arising from the inferior pole of the left kidney is again noted measuring 2.2 cm, image 38 of series 2. No kidney stones or obstructive uropathy identified. Bilateral  renal vascular calcifications are noted. Urinary bladder is collapsed around a Foley catheter. No bladder calculi noted. Stomach/Bowel: Moderate distension of the stomach. The small bowel loops have a normal course and caliber without obstruction. Moderate stool burden identified throughout the colon. No pathologic dilatation of the large bowel loops identified. Vascular/Lymphatic: Calcified atherosclerotic disease involves the abdominal aorta. The abdominal aorta has a maximum AP dimension of 2.7 cm. No enlarged retroperitoneal or mesenteric adenopathy. No  enlarged pelvic or inguinal lymph nodes. Reproductive: Previous hysterectomy.  No adnexal mass noted. Other: No free fluid or fluid collections identified. Musculoskeletal: Degenerative disc disease is noted within the lumbar spine. The bones appear osteopenic. No fractures identified. IMPRESSION: 1. Moderate distension of the gastric lumen. No pathologic dilatation of the small or large bowel loops noted. 2. No findings to explain patient's hematuria. 3. Aortic atherosclerosis. Ectatic abdominal aorta at risk for aneurysm development. Recommend followup by ultrasound in 5 years. This recommendation follows ACR consensus guidelines: White Paper of the ACR Incidental Findings Committee II on Vascular Findings. J Am Coll Radiol 2013; 10:789-794. 4. Indeterminate intermediate attenuating lesion arises from the inferior pole of left kidney. This may represent a hemorrhagic or proteinaceous cyst. A small enhancing renal lesion cannot be excluded. Recommend followup imaging with non-emergent renal protocol MRI or CT. 5. Mild bilateral lower lobe airspace disease. Electronically Signed   By: Signa Kellaylor  Stroud M.D.   On: 09/03/2015 18:36   Dg Chest Port 1 View  09/13/2015  CLINICAL DATA:  COPD.  Follow-up shortness of breath and cough. EXAM: PORTABLE CHEST 1 VIEW COMPARISON:  September 09, 2015 FINDINGS: The heart size and mediastinal contours are stable. The heart size  is enlarged. There is a small left pleural effusion. There is no focal pneumonia or pulmonary edema. The lungs are hyperinflated. The visualized skeletal structures are stable. IMPRESSION: No focal pneumonia.  Small left pleural effusion.  Emphysema. Electronically Signed   By: Sherian ReinWei-Chen  Lin M.D.   On: 09/13/2015 08:27   Dg Chest Port 1 View  09/09/2015  CLINICAL DATA:  Shortness of breath today. Patient discharge from the hospital 09/07/2015. EXAM: PORTABLE CHEST 1 VIEW COMPARISON:  CT chest 07/22/2015. Single view of the chest 09/03/2015. FINDINGS: The lungs are emphysematous but clear. Heart size is enlarged. No pneumothorax or pleural effusion. Atherosclerosis noted. IMPRESSION: Emphysema without acute disease. Cardiomegaly. Atherosclerosis. Electronically Signed   By: Drusilla Kannerhomas  Dalessio M.D.   On: 09/09/2015 14:15   Dg Chest Port 1 View  09/03/2015  CLINICAL DATA:  Increasing shortness of breath EXAM: PORTABLE CHEST 1 VIEW COMPARISON:  09/02/2015 FINDINGS: Moderate cardiac enlargement. Aortic atherosclerosis. Mild interstitial edema is identified. No airspace consolidation. IMPRESSION: 1. Cardiac enlargement and mild edema. Electronically Signed   By: Signa Kellaylor  Stroud M.D.   On: 09/03/2015 19:46   Dg Chest Port 1 View  09/02/2015  CLINICAL DATA:  Shortness of breath today.  History of COPD. EXAM: PORTABLE CHEST 1 VIEW COMPARISON:  CT chest 07/22/2015.  Chest 07/21/2015. FINDINGS: Mild cardiac enlargement. Pulmonary vascularity is normal. Emphysematous changes in the lungs. Atelectasis in the lung bases. Fluid or thickened pleura in the left costophrenic angle. No focal consolidation. No pneumothorax. Mediastinal contours appear intact. Calcified aorta. IMPRESSION: Cardiac enlargement. Emphysematous changes in the lungs. Small pleural effusion or pleural thickening on the left. No focal airspace disease or consolidation in the lungs. Electronically Signed   By: Burman NievesWilliam  Stevens M.D.   On: 09/02/2015  01:17    CBC  Recent Labs Lab 09/28/15 1921 09/29/15 0513 09/30/15 0225 10/01/15 0535  WBC 9.2 8.3 11.4* 9.8  HGB 9.1* 7.8* 7.4* 7.6*  HCT 31.1* 26.1* 25.0* 25.7*  PLT 452* 415* 456* 486*  MCV 81.2 80.6 79.6 79.1  MCH 23.8* 24.1* 23.6* 23.4*  MCHC 29.3* 29.9* 29.6* 29.6*  RDW 19.6* 19.6* 19.4* 19.1*  LYMPHSABS 1.8 0.3*  --   --   MONOABS 0.6 0.0*  --   --   EOSABS  0.0 0.0  --   --   BASOSABS 0.0 0.0  --   --     Chemistries   Recent Labs Lab 09/28/15 1921 09/29/15 0513 09/30/15 0225 10/01/15 0535  NA 141 138 138 135  K 3.8 4.0 4.4 4.5  CL 95* 97* 101 98*  CO2 33* 29 29 31   GLUCOSE 161* 309* 206* 248*  BUN 18 15 15  22*  CREATININE 0.83 0.82 0.69 0.90  CALCIUM 9.0 8.5* 8.6* 8.6*  AST 18 21  --   --   ALT 13* 14  --   --   ALKPHOS 69 61  --   --   BILITOT 0.1* 0.4  --   --    ------------------------------------------------------------------------------------------------------------------ estimated creatinine clearance is 50.1 mL/min (by C-G formula based on Cr of 0.9). ------------------------------------------------------------------------------------------------------------------ No results for input(s): HGBA1C in the last 72 hours. ------------------------------------------------------------------------------------------------------------------ No results for input(s): CHOL, HDL, LDLCALC, TRIG, CHOLHDL, LDLDIRECT in the last 72 hours. ------------------------------------------------------------------------------------------------------------------ No results for input(s): TSH, T4TOTAL, T3FREE, THYROIDAB in the last 72 hours.  Invalid input(s): FREET3 ------------------------------------------------------------------------------------------------------------------  Recent Labs  09/30/15 1440  VITAMINB12 189  FOLATE 13.9  FERRITIN 37  TIBC 337  IRON 25*  RETICCTPCT 2.4    Coagulation profile  Recent Labs Lab 09/29/15 0513 09/30/15 0225  10/01/15 0535  INR 1.20 1.20 1.35    No results for input(s): DDIMER in the last 72 hours.  Cardiac Enzymes  Recent Labs Lab 09/30/15 0830 09/30/15 1440 09/30/15 1900  TROPONINI 0.15* 0.10* 0.13*   ------------------------------------------------------------------------------------------------------------------ Invalid input(s): POCBNP    Tanaja Ganger D.O. on 10/01/2015 at 12:43 PM  Between 7am to 7pm - Pager - [phone]  After 7pm go to [email or web address] - password [email or web address]  And look for the night coverage person covering for me after hours  [facility] Office  [phone]

## 2015-10-01 NOTE — Care Management Important Message (Signed)
Important Message  Patient Details  Name: Brenda Wells MRN: [id number] Date of Birth: [birth year]   Medicare Important Message Given:  Yes    [name], Maylen Waltermire Kaye, RN 10/01/2015, 3:19 PM

## 2015-10-01 NOTE — Evaluation (Signed)
Physical Therapy Evaluation Patient Details Name: Brenda Wells MRN: [id number] DOB: 1941-06-26 Today's Date: 10/01/2015   History of Present Illness  Pt is a 75 y/o F on home oxygen 4 L [place] who presented w/ SOB.  Diagnosis: acute on chronic respiratory failure/COPD exacerbation/possible spesis.  Pt's PMH includes anemia, DHF, back surgery.  Clinical Impression  Pt admitted with above diagnosis. Pt currently with functional limitations due to the deficits listed below (see PT Problem List). Ms. Zentz currently limited by DOE 4/4.  She tolerated sit<>stand x 6 sitting EOB w/ a sitting rest break.  She will have 24/7 assist available at d/c from her son and son's girlfriend.  Pt will benefit from skilled PT to increase their independence and safety with mobility to allow discharge to the venue listed below.      Follow Up Recommendations Home health PT;Supervision for mobility/OOB    Equipment Recommendations  None recommended by PT    Recommendations for Other Services OT consult     Precautions / Restrictions Precautions Precautions: Fall Precaution Comments: watch HR and sats Restrictions Weight Bearing Restrictions: No      Mobility  Bed Mobility Overal bed mobility: Needs Assistance Bed Mobility: Supine to Sit     Supine to sit: Min guard     General bed mobility comments: Increased time and use of bed rail.  1 rest break due to DOE  Transfers Overall transfer level: Needs assistance Equipment used: None Transfers: Sit to/from Stand Sit to Stand: Min guard         General transfer comment: Close min guard as pt demonstrates mild instability w/ sit<>stand.  Sit<>stand performed from bed x6.    Ambulation/Gait             General Gait Details: deferred this session as pt w/ 4/4 DOE w/ sit<>stand.  Pt did take 2 steps Lt and Rt at bedside w/ min guard assist.  Stairs            Wheelchair Mobility    Modified Rankin (Stroke Patients Only)        Balance Overall balance assessment: Needs assistance Sitting-balance support: Bilateral upper extremity supported;Feet supported Sitting balance-Leahy Scale: Fair     Standing balance support: During functional activity;No upper extremity supported Standing balance-Leahy Scale: Fair                               Pertinent Vitals/Pain Pain Assessment: No/denies pain    Home Living Family/patient expects to be discharged to:: Private residence Living Arrangements: Children Available Help at Discharge: Family;Available 24 hours/day (son and son's girlfriend) Type of Home: House Home Access: Stairs to enter Entrance Stairs-Rails: Lawyer of Steps: 5 Home Layout: One level Home Equipment: Environmental consultant - 2 wheels;Bedside commode;Wheelchair - [employer] - standard      Prior Function Level of Independence: Needs assistance   Gait / Transfers Assistance Needed: Ind w/ ambulating short distance from bed to WC w/o AD.  Leaves her RW in between her WC and bed in case she needs it.  ADL's / Homemaking Assistance Needed: sponge bath w/ assist of son's girlfriend to wash her back        Hand Dominance   Dominant Hand: Right    Extremity/Trunk Assessment   Upper Extremity Assessment: Overall WFL for tasks assessed           Lower Extremity Assessment: RLE deficits/detail;LLE deficits/detail RLE Deficits /  Details: strength grossly ~4/5 LLE Deficits / Details: strength grossly ~4/5     Communication   Communication: No difficulties  Cognition Arousal/Alertness: Awake/alert Behavior During Therapy: WFL for tasks assessed/performed Overall Cognitive Status: Within Functional Limits for tasks assessed                      General Comments General comments (skin integrity, edema, etc.): HR up to 114 w/ activity and DOE 4/4.  Pt pauses when talking to catch her breath.  SpO2 remained in 90's throughout duration of session.     Exercises General Exercises - Lower Extremity Ankle Circles/Pumps: AROM;Both;10 reps;Seated Hip ABduction/ADduction: AROM;Both;10 reps;Seated Hip Flexion/Marching: AROM;Both;10 reps;Seated Other Exercises Other Exercises: Sit<>stand x6 from bed.  Requires sitting rest break for ~3 minutes after 3 reps to catch her breath before continuing      Assessment/Plan    PT Assessment Patient needs continued PT services  PT Diagnosis Difficulty walking   PT Problem List Decreased strength;Decreased activity tolerance;Decreased balance;Decreased mobility;Decreased knowledge of use of DME;Decreased safety awareness;Cardiopulmonary status limiting activity  PT Treatment Interventions DME instruction;Gait training;Functional mobility training;Therapeutic activities;Therapeutic exercise;Balance training;Neuromuscular re-education;Patient/family education   PT Goals (Current goals can be found in the Care Plan section) Acute Rehab PT Goals Patient Stated Goal: to go home PT Goal Formulation: With patient Time For Goal Achievement: 10/15/15 Potential to Achieve Goals: Good    Frequency Min 3X/week   Barriers to discharge Inaccessible home environment steps to enter home    Co-evaluation               End of Session Equipment Utilized During Treatment: Oxygen Activity Tolerance: Patient limited by fatigue Patient left: in bed;with call bell/phone within reach (sitting EOB) Nurse Communication: Mobility status;Other (comment) (SpO2, HR)         Time: [id number] PT Time Calculation (min) (ACUTE ONLY): 21 min   Charges:   PT Evaluation $PT Eval Moderate Complexity: 1 Procedure     PT G Codes:       Michail JewelsAshley Parr PT, DPT [phone] Pager: [phone] 10/01/2015, 3:57 PM

## 2015-10-01 NOTE — Progress Notes (Signed)
ANTICOAGULATION + ANTIBIOTIC CONSULT NOTE - Follow Up Consult  Pharmacy Consult for Heparin and Coumadin -> Eliquis;  Vanc and Zosyn Indication: atrial fibrillation and hx DVT  No Known Allergies  Patient Measurements: Height: 5\' 5"  (165.1 cm) Weight: 142 lb (64.411 kg) IBW/kg (Calculated) : 57 Heparin Dosing Weight: 64 kg  Vital Signs: Temp: 98 F (36.7 C) (01/13 0808) Temp Source: Oral (01/13 0808) BP: 127/73 mmHg (01/13 0805) Pulse Rate: 101 (01/13 0515)  Labs:  Recent Labs  09/29/15 0513  09/30/15 0225 09/30/15 0830 09/30/15 1440 09/30/15 1900 10/01/15 0535  HGB 7.8*  --  7.4*  --   --   --  7.6*  HCT 26.1*  --  25.0*  --   --   --  25.7*  PLT 415*  --  456*  --   --   --  486*  LABPROT 15.4*  --  15.4*  --   --   --  16.7*  INR 1.20  --  1.20  --   --   --  1.35  HEPARINUNFRC  --   < > 0.30 0.48  --   --  0.47  CREATININE 0.82  --  0.69  --   --   --  0.90  TROPONINI <0.03  < >  --  0.15* 0.10* 0.13*  --   < > = values in this interval not displayed.  Estimated Creatinine Clearance: 50.1 mL/min (by C-G formula based on Cr of 0.9).  Assessment:   Heparin level is therapeutic (0.47) on 1150 units/hr.   INR 1.35 after Coumadin 5 mg x 2 days.  Hgb has trended down, low stable. Anemia panel done, Tsat low.  No bleeding noted. No check stools for occult blood.   Changed from Eliquis to Coumadin on 09/12/16 during 12/22-27/16 admission.  Patient reported on 09/30/15 that she did not continue Eliquis after discharge, since she wanted to use up her Coumadin supply before starting Eliquis. She thinks she has about 10 Coumadin tablets left.   Discussed with Dr. Catha GosselinMikhail and patient today.  Will change to Eliquis now. Patient states that she will get Rx filled at discharge and discard remaining Coumadin supply.    Day # 3 Vancomycin and Zosyn for sepsis coverage.   Afebrile, WBC down to 9.8, LA up to 3.4 today. Blood cultures negative to date; urine culture negative.  MRSA  screen positive on 1/12, on CHG/Bactroban 5-day course.     BUN/creatinine have trended up a bit.  Goal of Therapy:  Therapeutic anticoagulation with Eliquis Monitor platelets by anticoagulation protocol: Yes Vancomycin trough levels 15-20 mcg/ml Appropriate Zosyn dose for renal function and infection  Plan:   Change Heparin/Coumadin to Eliquis 5 mg BID.  Follow up for any s/sx bleeding, follow up intermittent CBC.  Continue Vancomycin 750 mg IV q12hrs.  Vanc trough level prior to 6am dose on 10/02/15.  Continue Zosyn 3.375 gm IV q8hrs (each over 4 hrs)  Follow renal function, final culture data, progress.  Dennie Fettersgan, Katrisha Segall [place], [place] Pager: [phone] 10/01/2015,1:09 PM

## 2015-10-02 DIAGNOSIS — I5031 Acute diastolic (congestive) heart failure: Secondary | ICD-10-CM

## 2015-10-02 LAB — CBC
HEMATOCRIT: 26.7 % — AB (ref 36.0–46.0)
HEMOGLOBIN: 7.9 g/dL — AB (ref 12.0–15.0)
MCH: 23.5 pg — ABNORMAL LOW (ref 26.0–34.0)
MCHC: 29.6 g/dL — ABNORMAL LOW (ref 30.0–36.0)
MCV: 79.5 fL (ref 78.0–100.0)
Platelets: 521 10*3/uL — ABNORMAL HIGH (ref 150–400)
RBC: 3.36 MIL/uL — AB (ref 3.87–5.11)
RDW: 19.1 % — ABNORMAL HIGH (ref 11.5–15.5)
WBC: 9 10*3/uL (ref 4.0–10.5)

## 2015-10-02 LAB — BASIC METABOLIC PANEL
ANION GAP: 9 (ref 5–15)
BUN: 19 mg/dL (ref 6–20)
CALCIUM: 8.9 mg/dL (ref 8.9–10.3)
CHLORIDE: 96 mmol/L — AB (ref 101–111)
CO2: 33 mmol/L — AB (ref 22–32)
Creatinine, Ser: 0.82 mg/dL (ref 0.44–1.00)
GFR calc non Af Amer: >60 mL/min (ref >60)
Glucose, Bld: 228 mg/dL — ABNORMAL HIGH (ref 65–99)
POTASSIUM: 4.8 mmol/L (ref 3.5–5.1)
Sodium: 138 mmol/L (ref 135–145)

## 2015-10-02 LAB — VANCOMYCIN, TROUGH: Vancomycin Tr: 16 ug/mL (ref 10.0–20.0)

## 2015-10-02 LAB — LACTIC ACID, PLASMA: LACTIC ACID, VENOUS: 3.4 mmol/L — AB (ref 0.5–2.0)

## 2015-10-02 MED ORDER — OXYCODONE HCL 5 MG PO TABS
5.0000 mg | ORAL_TABLET | ORAL | Status: DC | PRN
  Administered 2015-10-02 – 2015-10-03 (×3): 5 mg via ORAL
  Filled 2015-10-02 (×3): qty 1

## 2015-10-02 MED ORDER — FUROSEMIDE 10 MG/ML IJ SOLN
20.0000 mg | Freq: Two times a day (BID) | INTRAMUSCULAR | Status: DC
  Administered 2015-10-02 – 2015-10-03 (×3): 20 mg via INTRAVENOUS
  Filled 2015-10-02 (×3): qty 2

## 2015-10-02 MED ORDER — FUROSEMIDE 10 MG/ML IJ SOLN: 40.0000 mg | Freq: Two times a day (BID) | INTRAMUSCULAR | Status: DC

## 2015-10-02 MED ORDER — POLYETHYLENE GLYCOL 3350 17 G PO PACK
17.0000 g | PACK | Freq: Every day | ORAL | Status: DC
  Administered 2015-10-02 – 2015-10-04 (×3): 17 g via ORAL
  Filled 2015-10-02 (×3): qty 1

## 2015-10-02 NOTE — Progress Notes (Signed)
Daily Progress Note   Patient Name: Brenda Wells       Date: 10/02/2015 DOB: Oct 12, 1940  Age: 75 y.o. MRN#: [id number] Attending Physician: Edsel Petrin, DO Primary Care Physician: [name], MD Admit Date: 09/28/2015  Reason for Consultation/Follow-up: Hospice Evaluation and Non pain symptom management  Subjective: Pt reports feeling better overall. She has not been using oxycodone for dyspnea consistently. She used 1 prn yesterday and 1 today. States it "helps a little". No adverse effects reported. Discussed hospice as potential option to provide extra support in the home. She currently has home health and wants to cont with this plan as they provide physical therapy. I did educate her on how to access hospice after home health services expire, but also discussed that they provide 24/7 service which could enable her to stay out of the hospital if she so desires.  Interval Events: Clinical improvment Length of Stay: 4 days  Current Medications: Scheduled Meds:  . apixaban  5 mg Oral BID  . Chlorhexidine Gluconate Cloth  6 each Topical Q0600  . diltiazem  180 mg Oral Daily  . ipratropium  0.5 mg Nebulization Q6H  . levalbuterol  0.63 mg Nebulization Q6H  . methylPREDNISolone (SOLU-MEDROL) injection  40 mg Intravenous BID  . mupirocin ointment  1 application Nasal BID  . piperacillin-tazobactam (ZOSYN)  IV  3.375 g Intravenous 3 times per day  . polyethylene glycol  17 g Oral Daily  . sodium chloride  3 mL Intravenous Q12H  . vancomycin  750 mg Intravenous Q12H    Continuous Infusions:    PRN Meds: acetaminophen **OR** acetaminophen, levalbuterol, ondansetron **OR** ondansetron (ZOFRAN) IV, oxyCODONE  Physical Exam: Physical Exam  Constitutional: She is oriented to  person, place, and time. She appears well-nourished.  HENT:  Head: Normocephalic and atraumatic.  Pulmonary/Chest:  Increased work of breathing with conversation  Musculoskeletal: Normal range of motion.  Neurological: She is alert and oriented to person, place, and time.  Skin: Skin is warm and dry.  Psychiatric: She has a normal mood and affect.                Vital Signs: BP 130/84 mmHg  Pulse 98  Temp(Src) 98.1 F (36.7 C) (Oral)  Resp 22  Ht 5\' 5"  (1.651 m)  Wt 64.139 kg (141 lb  6.4 oz)  BMI 23.53 kg/m2  SpO2 100% SpO2: SpO2: 100 % O2 Device: O2 Device: Nasal Cannula O2 Flow Rate: O2 Flow Rate (L/min): 4 L/min  Intake/output summary:  Intake/Output Summary (Last 24 hours) at 10/02/15 1147 Last data filed at 10/02/15 0900  Gross per 24 hour  Intake    780 ml  Output   4500 ml  Net  -3720 ml   LBM: Last BM Date: 09/29/15 Baseline Weight: Weight: 60.782 kg (134 lb) Most recent weight: Weight: 64.139 kg (141 lb 6.4 oz)       Palliative Assessment/Data: Flowsheet Rows        Most Recent Value   Intake Tab    Referral Department  Hospitalist   Unit at Time of Referral  Cardiac/Telemetry Unit   Palliative Care Primary Diagnosis  Pulmonary   Date Notified  09/29/15   Palliative Care Type  New Palliative care   Reason for referral  Non-pain Symptom, Advance Care Planning   Date of Admission  09/28/15   Date first seen by Palliative Care  09/30/15   # of days Palliative referral response time  1 Day(s)   # of days IP prior to Palliative referral  1   Clinical Assessment    Palliative Performance Scale Score  40%   Pain Max last 24 hours  0   Pain Min Last 24 hours  0   Dyspnea Max Last 24 Hours  6   Dyspnea Min Last 24 hours  3   Psychosocial & Spiritual Assessment    Social Work Plan of Care  Clarified patient/family wishes with healthcare team   Palliative Care Outcomes    Patient/Family meeting held?  No   Palliative Care Outcomes  Clarified goals of care,  Counseled regarding hospice, Improved non-pain symptom therapy   Patient/Family wishes: Interventions discontinued/not started   Mechanical Ventilation   Palliative Care follow-up planned  Yes, Facility      Additional Data Reviewed: CBC    Component Value Date/Time   WBC 9.0 10/02/2015 0525   RBC 3.36* 10/02/2015 0525   RBC 3.25* 09/30/2015 1440   HGB 7.9* 10/02/2015 0525   HCT 26.7* 10/02/2015 0525   PLT 521* 10/02/2015 0525   MCV 79.5 10/02/2015 0525   MCH 23.5* 10/02/2015 0525   MCHC 29.6* 10/02/2015 0525   RDW 19.1* 10/02/2015 0525   LYMPHSABS 0.3* 09/29/2015 0513   MONOABS 0.0* 09/29/2015 0513   EOSABS 0.0 09/29/2015 0513   BASOSABS 0.0 09/29/2015 0513    CMP     Component Value Date/Time   NA 138 10/02/2015 0525   K 4.8 10/02/2015 0525   CL 96* 10/02/2015 0525   CO2 33* 10/02/2015 0525   GLUCOSE 228* 10/02/2015 0525   BUN 19 10/02/2015 0525   CREATININE 0.82 10/02/2015 0525   CALCIUM 8.9 10/02/2015 0525   PROT 5.5* 09/29/2015 0513   ALBUMIN 2.4* 09/29/2015 0513   AST 21 09/29/2015 0513   ALT 14 09/29/2015 0513   ALKPHOS 61 09/29/2015 0513   BILITOT 0.4 09/29/2015 0513   GFRNONAA >60 10/02/2015 0525   GFRAA >60 10/02/2015 0525       Problem List:  Patient Active Problem List   Diagnosis Date Noted  . Palliative care encounter 09/30/2015  . Dyspnea 09/30/2015  . DNR (do not resuscitate) 09/30/2015  . Atrial fibrillation with RVR (HCC) 09/29/2015  . Acute respiratory failure (HCC) 09/29/2015  . Steroid-induced hyperglycemia 09/09/2015  . GERD (gastroesophageal reflux disease) 09/02/2015  .  Chronic diastolic (congestive) heart failure (HCC) 09/02/2015  . Chronic anticoagulation - Coumadin, CHADS2VASC=4 08/20/2015  . Shortness of breath   . Acute on chronic respiratory failure with hypoxia (HCC) 07/22/2015  . Chronic atrial fibrillation (HCC) 07/22/2015  . History of DVT (deep vein thrombosis) 07/22/2015  . Anemia 07/22/2015  . COPD exacerbation  (HCC) 07/21/2015     Palliative Care Assessment & Plan    1.Code Status:  DNR    Code Status Orders        Start     Ordered   09/29/15 0420  Do not attempt resuscitation (DNR)   Continuous    Question Answer Comment  In the event of cardiac or respiratory ARREST Do not call a "code blue"   In the event of cardiac or respiratory ARREST Do not perform Intubation, CPR, defibrillation or ACLS   In the event of cardiac or respiratory ARREST Use medication by any route, position, wound care, and other measures to relive pain and suffering. May use oxygen, suction and manual treatment of airway obstruction as needed for comfort.      09/29/15 0420    Code Status History    Date Active Date Inactive Code Status Order ID Comments User Context   09/09/2015  8:55 PM 09/14/2015  4:48 PM DNR [id number]  Russella Dar, NP Inpatient   09/02/2015 10:17 AM 09/07/2015  6:36 PM DNR [id number]  Richarda Overlie, MD Inpatient   09/02/2015  3:10 AM 09/02/2015 10:17 AM Partial Code [id number]  Lorretta Harp, MD ED   07/22/2015 12:30 AM 07/27/2015  6:42 PM DNR [id number]  Eduard Clos, MD Inpatient       2. Goals of Care/Additional Recommendations:  When medically ready, will go home to son's house with home health services  Limitations on Scope of Treatment: No Tracheostomy  Desire for further Chaplaincy support:no   3. Symptom Management:      1.Dyspnea: Tolerated low dose oxycodone 2.5 prn without undue sedation. Agreeable to up titrate. Will order oxycodone 5 mg q4 prn       2. Constipation: Will schedule miralax LBM 09-29-15  4. Palliative Prophylaxis:   Bowel Regimen, Delirium Protocol and Turn Reposition  5. Prognosis: Less than 6 months. Likely qualifies for in home hospice. Discussed this with pt  6. Discharge Planning:  Home with Home Health   Care plan was discussed with Dr. Catha Gosselin  Thank you for allowing the Palliative Medicine Team to assist in the care of this  patient.   Time In: 0830 Time Out: 0855 Total Time 25 min Prolonged Time Billed  no         Irean Hong, NP  10/02/2015, 11:47 AM  Please contact Palliative Medicine Team phone at [phone] for questions and concerns.

## 2015-10-02 NOTE — Progress Notes (Signed)
[facility]                                                                              Patient Demographics  Brenda Wells, is a 75 y.o. female, DOB - April 08, 1941, WUJ:[id number]  Admit date - 09/28/2015   Admitting Physician Eduard Clos, MD  Outpatient Primary MD for the patient is [name],[name], MD  LOS - 4   Chief Complaint  Patient presents with  . Shortness of Breath      HPI on 09/29/2015 by Dr. Midge Minium Brenda Wells is a 75 y.o. female history of COPD on home oxygen 4 L nasal cannula, diastolic CHF, chronic atrial fibrillation, history of DVT presents to the ER because of sudden onset of shortness of breath since yesterday afternoon after 4 PM. Denies any chest pain. Has been having some cough. Denies any fever or chills. Chest x-ray in the ER shows mild left pleural effusion. Patient was wheezing and required nebulizer treatment. Patient's heart rate on my exam has increased and was in A. fib with RVR. Patient denies any nausea vomiting or diarrhea. Lactic acid was elevated and patient was given 500 mL normal saline bolus. Patient will be admitted for acute respiratory failure probably from COPD exacerbation with possible developing sepsis.   Assessment & Plan  Acute on chronic respiratory failure/COPD exacerbation/ CHF exac -Continue nebs, Pulmicort, IV steroids, antibiotics-vancomycin/Zosyn -Lactic acid trending downward -Chest x-ray showed stable advanced emphysema with hyperinflation, small left pleural effusion without edema or focal infiltrate -CTA chest negative for pulmonary embolism or acute disease -Urine and blood cultures show no growth to date -CXR 10/01/2015: Pulm venous congestion, small left effusion, COPD -PT consulted for eval and treatment  Atrial fibrillation with RVR -Likely secondary to the above -Patient more rate controlled -It seems that patient's INR was difficult to control as an outpatient and she was supposed to be  switched to Eliquis, but did not start as she had just had her coumadin filled -INR subtherapeutic- discontinued Coumadin and heparin -Continue Cardizem -Continue Eliquis (started on 10/01/15)  Acute diastolic heart failure -Echocardiogram 07/22/2015 shows an EF of 50-55% -continue to monitor intake and output, daily weights -UOP 4250cc over past 24hrs  Elevated troponin -Peaked at 0.15, trended and remaining fairly flat -Likely secondary to demand ischemia and Afib RVR  History of DVT -Continue Eliquis  Chronic anemia -Hemoglobin currently 7.9, baseline appears to be an 8-9 -Continue to monitor CBC -Anemia panel: Iron 25, Ferritin 37, B12 189 -FOBT pending  Vitamin B12 def -B12 189 -given Vit B12 injection  Goals of care -Patient has been admitted 4 times in 2 months for COPD exac -Palliative care consulted and appreciated -Patient referred to hospice, but not ready for their services  Physical deconditioning -PT consulted and appreciated- rec HH and OT consult -OT consulted  Code Status: DNR  Family Communication: None at bedside  Disposition Plan: Admitted. Pending improvement in respiratory status.    Time Spent in minutes 30 minutes  Procedures  None  Consults  Palliative care  DVT ProphylaxisEliquis  Lab Results  Component Value Date   PLT 521* 10/02/2015    Medications  Scheduled Meds: . apixaban  5 mg Oral BID  . Chlorhexidine Gluconate Cloth  6 each Topical Q0600  . diltiazem  180 mg Oral Daily  . ipratropium  0.5 mg Nebulization Q6H  . levalbuterol  0.63 mg Nebulization Q6H  . methylPREDNISolone (SOLU-MEDROL) injection  40 mg Intravenous BID  . mupirocin ointment  1 application Nasal BID  . piperacillin-tazobactam (ZOSYN)  IV  3.375 g Intravenous 3 times per day  . polyethylene glycol  17 g Oral Daily  . sodium chloride  3 mL Intravenous Q12H  . vancomycin  750 mg Intravenous Q12H   Continuous Infusions:   PRN  Meds:.acetaminophen **OR** acetaminophen, levalbuterol, ondansetron **OR** ondansetron (ZOFRAN) IV, oxyCODONE  Antibiotics    Anti-infectives    Start     Dose/Rate Route Frequency Ordered Stop   09/29/15 1800  vancomycin (VANCOCIN) IVPB 750 mg/150 ml premix     750 mg 150 mL/hr over 60 Minutes Intravenous Every 12 hours 09/29/15 0629     09/29/15 0645  vancomycin (VANCOCIN) IVPB 1000 mg/200 mL premix     1,000 mg 200 mL/hr over 60 Minutes Intravenous  Once 09/29/15 0629 09/29/15 1147   09/29/15 0630  piperacillin-tazobactam (ZOSYN) IVPB 3.375 g     3.375 g 12.5 mL/hr over 240 Minutes Intravenous 3 times per day 09/29/15 0629     09/29/15 0430  levofloxacin (LEVAQUIN) IVPB 750 mg  Status:  Discontinued    Comments:  Levaquin 750 mg IV q24h for CrCl> 50 mL/min   750 mg 100 mL/hr over 90 Minutes Intravenous Daily 09/29/15 0400 09/29/15 1610      Subjective:   Brenda Wells seen and examined today.  Patient feels her breathing is improving but worsens with exertion.  Denies chest pain, abdominal pain, N/V/D.  Does not feel constipated, but has not had a BM in a while.  Objective:   Filed Vitals:   10/02/15 0200 10/02/15 0315 10/02/15 0749 10/02/15 0910  BP:   133/86   Pulse: 84  103   Temp:  97.7 F (36.5 C) 97.9 F (36.6 C)   TempSrc:  Oral Oral   Resp: 20  20   Height:      Weight:  64.139 kg (141 lb 6.4 oz)    SpO2: 100%  94% 98%    Wt Readings from Last 3 Encounters:  10/02/15 64.139 kg (141 lb 6.4 oz)  09/12/15 65.2 kg (143 lb 11.8 oz)  09/07/15 59.966 kg (132 lb 3.2 oz)     Intake/Output Summary (Last 24 hours) at 10/02/15 1133 Last data filed at 10/02/15 0900  Gross per 24 hour  Intake    780 ml  Output   4500 ml  Net  -3720 ml    Exam  General: Well developed, chronically ill, NAD  HEENT: NCAT,  mucous membranes moist.   Cardiovascular: S1 S2 auscultated, RRR  Respiratory: diminished breath sounds, congested/wet cough  Abdomen: Soft, nontender,  nondistended, + bowel sounds  Extremities: warm dry without cyanosis clubbing or edema  Neuro: AAOx3, nonfocal  Psych: Normal affect and demeanor, pleasant  Data Review   Micro Results Recent Results (from the past 240 hour(s))  Culture, blood (routine x 2)     Status: None (Preliminary result)   Collection Time: 09/28/15  7:30 PM  Result Value Ref Range Status   Specimen Description BLOOD RIGHT ANTECUBITAL  Final   Special Requests BOTTLES DRAWN AEROBIC AND ANAEROBIC 5CC  Final   Culture NO GROWTH 3 DAYS  Final   Report Status  PENDING  Incomplete  Culture, blood (routine x 2)     Status: None (Preliminary result)   Collection Time: 09/28/15  7:45 PM  Result Value Ref Range Status   Specimen Description BLOOD LEFT ANTECUBITAL  Final   Special Requests BOTTLES DRAWN AEROBIC AND ANAEROBIC 5CC  Final   Culture NO GROWTH 3 DAYS  Final   Report Status PENDING  Incomplete  Urine culture     Status: None   Collection Time: 09/28/15  9:32 PM  Result Value Ref Range Status   Specimen Description URINE, CATHETERIZED  Final   Special Requests NONE  Final   Culture NO GROWTH 2 DAYS  Final   Report Status 09/30/2015 FINAL  Final    Radiology Reports Dg Chest 2 View  09/28/2015  CLINICAL DATA:  Shortness of breath, weakness and history of COPD. EXAM: CHEST - 2 VIEW COMPARISON:  09/13/2015 FINDINGS: Stable advanced emphysematous lung disease with moderate bilateral hyperinflation. There is no evidence of pulmonary edema, consolidation, pneumothorax or nodule. Tiny left pleural effusion present. Stable top-normal heart size. IMPRESSION: Stable advanced emphysema with hyperinflation. Small left pleural effusion without overt pulmonary edema or focal infiltrate. Electronically Signed   By: Irish Lack M.D.   On: 09/28/2015 20:39   Ct Angio Chest Pe W/cm &/or Wo Cm  09/29/2015  CLINICAL DATA:  Patient with a DVT in the right lower extremity. History of COPD. Worsening shortness of breath.  Initial encounter. EXAM: CT ANGIOGRAPHY CHEST WITH CONTRAST TECHNIQUE: Multidetector CT imaging of the chest was performed using the standard protocol during bolus administration of intravenous contrast. Multiplanar CT image reconstructions and MIPs were obtained to evaluate the vascular anatomy. CONTRAST:  80 mL OMNIPAQUE IOHEXOL 350 MG/ML SOLN COMPARISON:  CT chest 07/22/2015.  PA and lateral chest 09/28/2015. FINDINGS: No pulmonary embolus is identified. Extensive aortic and coronary atherosclerosis is seen. No pathologic lymphadenopathy by CT size criteria in the axilla, hila or mediastinum is identified. Very small bilateral pleural effusions are seen and decreased since the prior CT. Heart size is mildly enlarged. The lungs demonstrate extensive emphysematous change. Chronic atelectasis in the left lower lobe is unchanged. Volume loss in the right middle lobe seen on the prior CT has resolved. The lungs are otherwise clear. Visualized upper abdomen shows an unchanged low-attenuation lesion in the caudate lobe of the liver most consistent with a cyst. No focal bony abnormality is identified. Review of the MIP images confirms the above findings. IMPRESSION: Negative for pulmonary embolus or acute disease. Trace bilateral pleural effusions are decreased since the prior CT. Emphysema. Mild cardiomegaly. Calcific aortic and coronary atherosclerosis. Electronically Signed   By: Drusilla Kanner M.D.   On: 09/29/2015 09:01   Ct Abdomen Pelvis W Contrast  09/03/2015  CLINICAL DATA:  Distended abdomen.  Hematuria. EXAM: CT ABDOMEN AND PELVIS WITH CONTRAST TECHNIQUE: Multidetector CT imaging of the abdomen and pelvis was performed using the standard protocol following bolus administration of intravenous contrast. CONTRAST:  OMNIPAQUE IOHEXOL 300 MG/ML  SOLN COMPARISON:  07/08/2015 FINDINGS: Lower chest: Patchy airspace densities are noted in the lung bases bilaterally. No pleural fluid. Hepatobiliary: Again  noted are multiple liver cysts. Previous cholecystectomy. No biliary dilatation. Pancreas: The pancreas is negative. Spleen: Normal appearance of the spleen. Adrenals/Urinary Tract: The adrenal glands are normal. Indeterminate intermediate attenuating lesion arising from the inferior pole of the left kidney is again noted measuring 2.2 cm, image 38 of series 2. No kidney stones or obstructive uropathy identified. Bilateral renal  vascular calcifications are noted. Urinary bladder is collapsed around a Foley catheter. No bladder calculi noted. Stomach/Bowel: Moderate distension of the stomach. The small bowel loops have a normal course and caliber without obstruction. Moderate stool burden identified throughout the colon. No pathologic dilatation of the large bowel loops identified. Vascular/Lymphatic: Calcified atherosclerotic disease involves the abdominal aorta. The abdominal aorta has a maximum AP dimension of 2.7 cm. No enlarged retroperitoneal or mesenteric adenopathy. No enlarged pelvic or inguinal lymph nodes. Reproductive: Previous hysterectomy.  No adnexal mass noted. Other: No free fluid or fluid collections identified. Musculoskeletal: Degenerative disc disease is noted within the lumbar spine. The bones appear osteopenic. No fractures identified. IMPRESSION: 1. Moderate distension of the gastric lumen. No pathologic dilatation of the small or large bowel loops noted. 2. No findings to explain patient's hematuria. 3. Aortic atherosclerosis. Ectatic abdominal aorta at risk for aneurysm development. Recommend followup by ultrasound in 5 years. This recommendation follows ACR consensus guidelines: White Paper of the ACR Incidental Findings Committee II on Vascular Findings. J Am Coll Radiol 2013; 10:789-794. 4. Indeterminate intermediate attenuating lesion arises from the inferior pole of left kidney. This may represent a hemorrhagic or proteinaceous cyst. A small enhancing renal lesion cannot be excluded.  Recommend followup imaging with non-emergent renal protocol MRI or CT. 5. Mild bilateral lower lobe airspace disease. Electronically Signed   By: Signa Kellaylor  Stroud M.D.   On: 09/03/2015 18:36   Dg Chest Port 1 View  10/01/2015  CLINICAL DATA:  Worsening shortness of breath EXAM: PORTABLE CHEST 1 VIEW COMPARISON:  09/28/2015 FINDINGS: Chronic cardiopericardial enlargement. Stable aortic contours. There is a small left pleural effusion. Interstitial coarsening without edema. No consolidation or pneumothorax. IMPRESSION: 1. Pulmonary venous congestion and small left effusion. 2. COPD. Electronically Signed   By: Marnee SpringJonathon  Watts M.D.   On: 10/01/2015 13:17   Dg Chest Port 1 View  09/13/2015  CLINICAL DATA:  COPD.  Follow-up shortness of breath and cough. EXAM: PORTABLE CHEST 1 VIEW COMPARISON:  September 09, 2015 FINDINGS: The heart size and mediastinal contours are stable. The heart size is enlarged. There is a small left pleural effusion. There is no focal pneumonia or pulmonary edema. The lungs are hyperinflated. The visualized skeletal structures are stable. IMPRESSION: No focal pneumonia.  Small left pleural effusion.  Emphysema. Electronically Signed   By: Sherian ReinWei-Chen  Lin M.D.   On: 09/13/2015 08:27   Dg Chest Port 1 View  09/09/2015  CLINICAL DATA:  Shortness of breath today. Patient discharge from the hospital 09/07/2015. EXAM: PORTABLE CHEST 1 VIEW COMPARISON:  CT chest 07/22/2015. Single view of the chest 09/03/2015. FINDINGS: The lungs are emphysematous but clear. Heart size is enlarged. No pneumothorax or pleural effusion. Atherosclerosis noted. IMPRESSION: Emphysema without acute disease. Cardiomegaly. Atherosclerosis. Electronically Signed   By: Drusilla Kannerhomas  Dalessio M.D.   On: 09/09/2015 14:15   Dg Chest Port 1 View  09/03/2015  CLINICAL DATA:  Increasing shortness of breath EXAM: PORTABLE CHEST 1 VIEW COMPARISON:  09/02/2015 FINDINGS: Moderate cardiac enlargement. Aortic atherosclerosis. Mild  interstitial edema is identified. No airspace consolidation. IMPRESSION: 1. Cardiac enlargement and mild edema. Electronically Signed   By: Signa Kellaylor  Stroud M.D.   On: 09/03/2015 19:46    CBC  Recent Labs Lab 09/28/15 1921 09/29/15 0513 09/30/15 0225 10/01/15 0535 10/02/15 0525  WBC 9.2 8.3 11.4* 9.8 9.0  HGB 9.1* 7.8* 7.4* 7.6* 7.9*  HCT 31.1* 26.1* 25.0* 25.7* 26.7*  PLT 452* 415* 456* 486* 521*  MCV 81.2 80.6  79.6 79.1 79.5  MCH 23.8* 24.1* 23.6* 23.4* 23.5*  MCHC 29.3* 29.9* 29.6* 29.6* 29.6*  RDW 19.6* 19.6* 19.4* 19.1* 19.1*  LYMPHSABS 1.8 0.3*  --   --   --   MONOABS 0.6 0.0*  --   --   --   EOSABS 0.0 0.0  --   --   --   BASOSABS 0.0 0.0  --   --   --     Chemistries   Recent Labs Lab 09/28/15 1921 09/29/15 0513 09/30/15 0225 10/01/15 0535 10/02/15 0525  NA 141 138 138 135 138  K 3.8 4.0 4.4 4.5 4.8  CL 95* 97* 101 98* 96*  CO2 33* 29 29 31  33*  GLUCOSE 161* 309* 206* 248* 228*  BUN 18 15 15  22* 19  CREATININE 0.83 0.82 0.69 0.90 0.82  CALCIUM 9.0 8.5* 8.6* 8.6* 8.9  AST 18 21  --   --   --   ALT 13* 14  --   --   --   ALKPHOS 69 61  --   --   --   BILITOT 0.1* 0.4  --   --   --    ------------------------------------------------------------------------------------------------------------------ estimated creatinine clearance is 55 mL/min (by C-G formula based on Cr of 0.82). ------------------------------------------------------------------------------------------------------------------ No results for input(s): HGBA1C in the last 72 hours. ------------------------------------------------------------------------------------------------------------------ No results for input(s): CHOL, HDL, LDLCALC, TRIG, CHOLHDL, LDLDIRECT in the last 72 hours. ------------------------------------------------------------------------------------------------------------------ No results for input(s): TSH, T4TOTAL, T3FREE, THYROIDAB in the last 72 hours.  Invalid input(s):  FREET3 ------------------------------------------------------------------------------------------------------------------  Recent Labs  09/30/15 1440  VITAMINB12 189  FOLATE 13.9  FERRITIN 37  TIBC 337  IRON 25*  RETICCTPCT 2.4    Coagulation profile  Recent Labs Lab 09/29/15 0513 09/30/15 0225 10/01/15 0535  INR 1.20 1.20 1.35    No results for input(s): DDIMER in the last 72 hours.  Cardiac Enzymes  Recent Labs Lab 09/30/15 0830 09/30/15 1440 09/30/15 1900  TROPONINI 0.15* 0.10* 0.13*   ------------------------------------------------------------------------------------------------------------------ Invalid input(s): POCBNP    Jourdyn Ferrin D.O. on 10/02/2015 at 11:33 AM  Between 7am to 7pm - Pager - [phone]  After 7pm go to [email or web address] - password [email or web address]  And look for the night coverage person covering for me after hours  [facility] Office  [phone]

## 2015-10-02 NOTE — Progress Notes (Signed)
Pharmacy Antibiotic Follow-up Note  Brenda Wells is a 75 y.o. year-old female admitted on 09/28/2015.  The patient is currently on day #4 of Vancomycin and Zosyn for sepsis and pulmonary coverage.  Assessment/Plan:  Vancomycin trough level today is at goal, 16 mcg/ml  Continue Vancomycin 750 mg IV q12hrs.  Continue Zoysn 3.375 gm IV q8hrs (each over 4 hrs)  Follow up renal function, final culture data, progress, and length of antibiotic therapy.  Temp (24hrs), Avg:97.9 F (36.6 C), Min:97.7 F (36.5 C), Max:98.2 F (36.8 C)   Recent Labs Lab 09/28/15 1921 09/29/15 0513 09/30/15 0225 10/01/15 0535 10/02/15 0525  WBC 9.2 8.3 11.4* 9.8 9.0    Recent Labs Lab 09/28/15 1921 09/29/15 0513 09/30/15 0225 10/01/15 0535 10/02/15 0525  CREATININE 0.83 0.82 0.69 0.90 0.82   Estimated Creatinine Clearance: 55 mL/min (by C-G formula based on Cr of 0.82).    No Known Allergies  Antimicrobials this admission: Vancomycin 1/11 >> Zosyn 1/11 >> LVQ x 1 on 1/11  Levels/dose changes this admission:  1/14: VT 16 mcg/ml at 0525 on 750 mg IV q12h  Microbiology results: 1/10 Blood x 2 - ng x 4 days so far 1/10 Urine - neg 12/15 (prior admit) MRSA screen POS - on CHG/Bactroban x 5 days  Brenda Landrygan, Delinda Malan Donovan, RPh Pager: [phone] 10/02/2015 2:47 PM

## 2015-10-03 DIAGNOSIS — J9601 Acute respiratory failure with hypoxia: Secondary | ICD-10-CM

## 2015-10-03 LAB — BASIC METABOLIC PANEL
ANION GAP: 13 (ref 5–15)
BUN: 22 mg/dL — ABNORMAL HIGH (ref 6–20)
CALCIUM: 8.8 mg/dL — AB (ref 8.9–10.3)
CO2: 32 mmol/L (ref 22–32)
Chloride: 91 mmol/L — ABNORMAL LOW (ref 101–111)
Creatinine, Ser: 0.89 mg/dL (ref 0.44–1.00)
Glucose, Bld: 218 mg/dL — ABNORMAL HIGH (ref 65–99)
Potassium: 4.8 mmol/L (ref 3.5–5.1)
Sodium: 136 mmol/L (ref 135–145)

## 2015-10-03 LAB — CULTURE, BLOOD (ROUTINE X 2)
Culture: NO GROWTH
Culture: NO GROWTH

## 2015-10-03 LAB — CBC
HEMATOCRIT: 29.4 % — AB (ref 36.0–46.0)
Hemoglobin: 8.9 g/dL — ABNORMAL LOW (ref 12.0–15.0)
MCH: 23.7 pg — ABNORMAL LOW (ref 26.0–34.0)
MCHC: 30.3 g/dL (ref 30.0–36.0)
MCV: 78.2 fL (ref 78.0–100.0)
PLATELETS: 585 10*3/uL — AB (ref 150–400)
RBC: 3.76 MIL/uL — ABNORMAL LOW (ref 3.87–5.11)
RDW: 18.9 % — AB (ref 11.5–15.5)
WBC: 9.1 10*3/uL (ref 4.0–10.5)

## 2015-10-03 MED ORDER — PREDNISONE 20 MG PO TABS
60.0000 mg | ORAL_TABLET | Freq: Every day | ORAL | Status: DC
  Administered 2015-10-03 – 2015-10-04 (×2): 60 mg via ORAL
  Filled 2015-10-03 (×2): qty 3

## 2015-10-03 MED ORDER — SODIUM CHLORIDE 0.9 % IV SOLN
510.0000 mg | Freq: Once | INTRAVENOUS | Status: AC
  Administered 2015-10-03: 510 mg via INTRAVENOUS
  Filled 2015-10-03: qty 17

## 2015-10-03 MED ORDER — GUAIFENESIN ER 600 MG PO TB12
1200.0000 mg | ORAL_TABLET | Freq: Two times a day (BID) | ORAL | Status: DC
  Administered 2015-10-03 – 2015-10-04 (×3): 1200 mg via ORAL
  Filled 2015-10-03 (×3): qty 2

## 2015-10-03 NOTE — Progress Notes (Signed)
Pt is sleeping and resting comfortably at this time. Neb not given at this time. PRN neb will be given if needed once awake for wheezing, or SOB. Pt is stable at this time o2 saturation at 99% no complications noted.

## 2015-10-03 NOTE — Progress Notes (Signed)
[facility]                                                                              Patient Demographics  Brenda Wells, is a 75 y.o. female, DOB - 1941-04-29, ZOX:[id number]  Admit date - 09/28/2015   Admitting Physician Eduard Clos, MD  Outpatient Primary MD for the patient is [name],[name], MD  LOS - 5   Chief Complaint  Patient presents with  . Shortness of Breath      HPI on 09/29/2015 by Dr. Midge Minium Brenda Wells is a 75 y.o. female history of COPD on home oxygen 4 L nasal cannula, diastolic CHF, chronic atrial fibrillation, history of DVT presents to the ER because of sudden onset of shortness of breath since yesterday afternoon after 4 PM. Denies any chest pain. Has been having some cough. Denies any fever or chills. Chest x-ray in the ER shows mild left pleural effusion. Patient was wheezing and required nebulizer treatment. Patient's heart rate on my exam has increased and was in A. fib with RVR. Patient denies any nausea vomiting or diarrhea. Lactic acid was elevated and patient was given 500 mL normal saline bolus. Patient will be admitted for acute respiratory failure probably from COPD exacerbation with possible developing sepsis.   Assessment & Plan  Acute on chronic respiratory failure/COPD exacerbation/ CHF exac -Continue nebs, Pulmicort, IV steroids, antibiotics-vancomycin/Zosyn -Lactic acid trending downward -Chest x-ray showed stable advanced emphysema with hyperinflation, small left pleural effusion without edema or focal infiltrate -CTA chest negative for pulmonary embolism or acute disease -Urine and blood cultures show no growth to date -CXR 10/01/2015: Pulm venous congestion, small left effusion, COPD -PT consulted for eval and treatment  Atrial fibrillation with RVR -Likely secondary to the above -Patient more rate controlled -It seems that patient's INR was difficult to control as an outpatient and she was supposed to be  switched to Eliquis, but did not start as she had just had her coumadin filled -INR subtherapeutic- discontinued Coumadin and heparin -Continue Cardizem -Continue Eliquis (started on 10/01/15)  Acute diastolic heart failure -Echocardiogram 07/22/2015 shows an EF of 50-55% -continue to monitor intake and output, daily weights -UOP 6500cc over past 24hrs  Elevated troponin -Peaked at 0.15, trended and remaining fairly flat -Likely secondary to demand ischemia and Afib RVR  History of DVT -Continue Eliquis  Chronic anemia -Hemoglobin currently 8.9, baseline appears to be an 8-9 -Continue to monitor CBC -Anemia panel: Iron 25, Ferritin 37, B12 189 -FOBT pending -Will give dose of feraheme (patient states Iron supplements upset her stomach)  Vitamin B12 def -B12 189 -given Vit B12 injection  Goals of care -Patient has been admitted 4 times in 2 months for COPD exac -Palliative care consulted and appreciated -Patient referred to hospice, but not ready for their services -Patient understand that her breathing may never improve  Physical deconditioning -PT consulted and appreciated- rec HH and OT consult -OT consulted and pending  Code Status: DNR  Family Communication: None at bedside  Disposition Plan: Admitted. Pending improvement in respiratory status, discharge likely within 24hours  Time Spent in minutes 30 minutes  Procedures  None  Consults  Palliative care  DVT ProphylaxisEliquis  Lab Results  Component Value Date   PLT 585* 10/03/2015    Medications  Scheduled Meds: . apixaban  5 mg Oral BID  . Chlorhexidine Gluconate Cloth  6 each Topical Q0600  . diltiazem  180 mg Oral Daily  . furosemide  20 mg Intravenous BID  . ipratropium  0.5 mg Nebulization Q6H  . levalbuterol  0.63 mg Nebulization Q6H  . mupirocin ointment  1 application Nasal BID  . piperacillin-tazobactam (ZOSYN)  IV  3.375 g Intravenous 3 times per day  . polyethylene glycol  17  g Oral Daily  . predniSONE  60 mg Oral Q breakfast  . sodium chloride  3 mL Intravenous Q12H  . vancomycin  750 mg Intravenous Q12H   Continuous Infusions:   PRN Meds:.acetaminophen **OR** acetaminophen, levalbuterol, ondansetron **OR** ondansetron (ZOFRAN) IV, oxyCODONE  Antibiotics    Anti-infectives    Start     Dose/Rate Route Frequency Ordered Stop   09/29/15 1800  vancomycin (VANCOCIN) IVPB 750 mg/150 ml premix     750 mg 150 mL/hr over 60 Minutes Intravenous Every 12 hours 09/29/15 0629     09/29/15 0645  vancomycin (VANCOCIN) IVPB 1000 mg/200 mL premix     1,000 mg 200 mL/hr over 60 Minutes Intravenous  Once 09/29/15 0629 09/29/15 1147   09/29/15 0630  piperacillin-tazobactam (ZOSYN) IVPB 3.375 g     3.375 g 12.5 mL/hr over 240 Minutes Intravenous 3 times per day 09/29/15 0629     09/29/15 0430  levofloxacin (LEVAQUIN) IVPB 750 mg  Status:  Discontinued    Comments:  Levaquin 750 mg IV q24h for CrCl> 50 mL/min   750 mg 100 mL/hr over 90 Minutes Intravenous Daily 09/29/15 0400 09/29/15 6045      Subjective:   Dariela Stoker seen and examined today.  Patient feels her breathing is improving but she has SOB with exertion.  Denies chest pain, abdominal pain, N/V/D. Continues to have cough.  Objective:   Filed Vitals:   10/02/15 2016 10/03/15 0447 10/03/15 0853 10/03/15 0900  BP: 118/65 117/76  128/54  Pulse: 93 109  111  Temp: 97.7 F (36.5 C) 97.9 F (36.6 C)  97.9 F (36.6 C)  TempSrc: Oral Oral  Oral  Resp: 20 20  22   Height:      Weight:  61.871 kg (136 lb 6.4 oz)    SpO2: 99% 97% 97% 98%    Wt Readings from Last 3 Encounters:  10/03/15 61.871 kg (136 lb 6.4 oz)  09/12/15 65.2 kg (143 lb 11.8 oz)  09/07/15 59.966 kg (132 lb 3.2 oz)     Intake/Output Summary (Last 24 hours) at 10/03/15 1215 Last data filed at 10/03/15 1043  Gross per 24 hour  Intake   1830 ml  Output   5850 ml  Net  -4020 ml    Exam  General: Well developed, chronically ill,  NAD  HEENT: NCAT,  mucous membranes moist.   Cardiovascular: S1 S2 auscultated, RRR  Respiratory: diminished breath sounds, congested/wet cough  Abdomen: Soft, nontender, nondistended, + bowel sounds  Extremities: warm dry without cyanosis clubbing or edema  Neuro: AAOx3, nonfocal  Psych: Normal affect and demeanor, pleasant  Data Review   Micro Results Recent Results (from the past 240 hour(s))  Culture, blood (routine x 2)     Status: None (Preliminary result)   Collection Time: 09/28/15  7:30 PM  Result Value Ref Range Status   Specimen Description BLOOD RIGHT ANTECUBITAL  Final  Special Requests BOTTLES DRAWN AEROBIC AND ANAEROBIC 5CC  Final   Culture NO GROWTH 4 DAYS  Final   Report Status PENDING  Incomplete  Culture, blood (routine x 2)     Status: None (Preliminary result)   Collection Time: 09/28/15  7:45 PM  Result Value Ref Range Status   Specimen Description BLOOD LEFT ANTECUBITAL  Final   Special Requests BOTTLES DRAWN AEROBIC AND ANAEROBIC 5CC  Final   Culture NO GROWTH 4 DAYS  Final   Report Status PENDING  Incomplete  Urine culture     Status: None   Collection Time: 09/28/15  9:32 PM  Result Value Ref Range Status   Specimen Description URINE, CATHETERIZED  Final   Special Requests NONE  Final   Culture NO GROWTH 2 DAYS  Final   Report Status 09/30/2015 FINAL  Final    Radiology Reports Dg Chest 2 View  09/28/2015  CLINICAL DATA:  Shortness of breath, weakness and history of COPD. EXAM: CHEST - 2 VIEW COMPARISON:  09/13/2015 FINDINGS: Stable advanced emphysematous lung disease with moderate bilateral hyperinflation. There is no evidence of pulmonary edema, consolidation, pneumothorax or nodule. Tiny left pleural effusion present. Stable top-normal heart size. IMPRESSION: Stable advanced emphysema with hyperinflation. Small left pleural effusion without overt pulmonary edema or focal infiltrate. Electronically Signed   By: Irish LackGlenn  Yamagata M.D.   On:  09/28/2015 20:39   Ct Angio Chest Pe W/cm &/or Wo Cm  09/29/2015  CLINICAL DATA:  Patient with a DVT in the right lower extremity. History of COPD. Worsening shortness of breath. Initial encounter. EXAM: CT ANGIOGRAPHY CHEST WITH CONTRAST TECHNIQUE: Multidetector CT imaging of the chest was performed using the standard protocol during bolus administration of intravenous contrast. Multiplanar CT image reconstructions and MIPs were obtained to evaluate the vascular anatomy. CONTRAST:  80 mL OMNIPAQUE IOHEXOL 350 MG/ML SOLN COMPARISON:  CT chest 07/22/2015.  PA and lateral chest 09/28/2015. FINDINGS: No pulmonary embolus is identified. Extensive aortic and coronary atherosclerosis is seen. No pathologic lymphadenopathy by CT size criteria in the axilla, hila or mediastinum is identified. Very small bilateral pleural effusions are seen and decreased since the prior CT. Heart size is mildly enlarged. The lungs demonstrate extensive emphysematous change. Chronic atelectasis in the left lower lobe is unchanged. Volume loss in the right middle lobe seen on the prior CT has resolved. The lungs are otherwise clear. Visualized upper abdomen shows an unchanged low-attenuation lesion in the caudate lobe of the liver most consistent with a cyst. No focal bony abnormality is identified. Review of the MIP images confirms the above findings. IMPRESSION: Negative for pulmonary embolus or acute disease. Trace bilateral pleural effusions are decreased since the prior CT. Emphysema. Mild cardiomegaly. Calcific aortic and coronary atherosclerosis. Electronically Signed   By: Drusilla Kannerhomas  Dalessio M.D.   On: 09/29/2015 09:01   Ct Abdomen Pelvis W Contrast  09/03/2015  CLINICAL DATA:  Distended abdomen.  Hematuria. EXAM: CT ABDOMEN AND PELVIS WITH CONTRAST TECHNIQUE: Multidetector CT imaging of the abdomen and pelvis was performed using the standard protocol following bolus administration of intravenous contrast. CONTRAST:  100mL  OMNIPAQUE IOHEXOL 300 MG/ML  SOLN COMPARISON:  07/08/2015 FINDINGS: Lower chest: Patchy airspace densities are noted in the lung bases bilaterally. No pleural fluid. Hepatobiliary: Again noted are multiple liver cysts. Previous cholecystectomy. No biliary dilatation. Pancreas: The pancreas is negative. Spleen: Normal appearance of the spleen. Adrenals/Urinary Tract: The adrenal glands are normal. Indeterminate intermediate attenuating lesion arising from the inferior pole of  the left kidney is again noted measuring 2.2 cm, image 38 of series 2. No kidney stones or obstructive uropathy identified. Bilateral renal vascular calcifications are noted. Urinary bladder is collapsed around a Foley catheter. No bladder calculi noted. Stomach/Bowel: Moderate distension of the stomach. The small bowel loops have a normal course and caliber without obstruction. Moderate stool burden identified throughout the colon. No pathologic dilatation of the large bowel loops identified. Vascular/Lymphatic: Calcified atherosclerotic disease involves the abdominal aorta. The abdominal aorta has a maximum AP dimension of 2.7 cm. No enlarged retroperitoneal or mesenteric adenopathy. No enlarged pelvic or inguinal lymph nodes. Reproductive: Previous hysterectomy.  No adnexal mass noted. Other: No free fluid or fluid collections identified. Musculoskeletal: Degenerative disc disease is noted within the lumbar spine. The bones appear osteopenic. No fractures identified. IMPRESSION: 1. Moderate distension of the gastric lumen. No pathologic dilatation of the small or large bowel loops noted. 2. No findings to explain patient's hematuria. 3. Aortic atherosclerosis. Ectatic abdominal aorta at risk for aneurysm development. Recommend followup by ultrasound in 5 years. This recommendation follows ACR consensus guidelines: White Paper of the ACR Incidental Findings Committee II on Vascular Findings. J Am Coll Radiol 2013; 10:789-794. 4. Indeterminate  intermediate attenuating lesion arises from the inferior pole of left kidney. This may represent a hemorrhagic or proteinaceous cyst. A small enhancing renal lesion cannot be excluded. Recommend followup imaging with non-emergent renal protocol MRI or CT. 5. Mild bilateral lower lobe airspace disease. Electronically Signed   By: Signa Kell M.D.   On: 09/03/2015 18:36   Dg Chest Port 1 View  10/01/2015  CLINICAL DATA:  Worsening shortness of breath EXAM: PORTABLE CHEST 1 VIEW COMPARISON:  09/28/2015 FINDINGS: Chronic cardiopericardial enlargement. Stable aortic contours. There is a small left pleural effusion. Interstitial coarsening without edema. No consolidation or pneumothorax. IMPRESSION: 1. Pulmonary venous congestion and small left effusion. 2. COPD. Electronically Signed   By: Marnee Spring M.D.   On: 10/01/2015 13:17   Dg Chest Port 1 View  09/13/2015  CLINICAL DATA:  COPD.  Follow-up shortness of breath and cough. EXAM: PORTABLE CHEST 1 VIEW COMPARISON:  September 09, 2015 FINDINGS: The heart size and mediastinal contours are stable. The heart size is enlarged. There is a small left pleural effusion. There is no focal pneumonia or pulmonary edema. The lungs are hyperinflated. The visualized skeletal structures are stable. IMPRESSION: No focal pneumonia.  Small left pleural effusion.  Emphysema. Electronically Signed   By: Sherian Rein M.D.   On: 09/13/2015 08:27   Dg Chest Port 1 View  09/09/2015  CLINICAL DATA:  Shortness of breath today. Patient discharge from the hospital 09/07/2015. EXAM: PORTABLE CHEST 1 VIEW COMPARISON:  CT chest 07/22/2015. Single view of the chest 09/03/2015. FINDINGS: The lungs are emphysematous but clear. Heart size is enlarged. No pneumothorax or pleural effusion. Atherosclerosis noted. IMPRESSION: Emphysema without acute disease. Cardiomegaly. Atherosclerosis. Electronically Signed   By: Drusilla Kanner M.D.   On: 09/09/2015 14:15   Dg Chest Port 1  View  09/03/2015  CLINICAL DATA:  Increasing shortness of breath EXAM: PORTABLE CHEST 1 VIEW COMPARISON:  09/02/2015 FINDINGS: Moderate cardiac enlargement. Aortic atherosclerosis. Mild interstitial edema is identified. No airspace consolidation. IMPRESSION: 1. Cardiac enlargement and mild edema. Electronically Signed   By: Signa Kell M.D.   On: 09/03/2015 19:46    CBC  Recent Labs Lab 09/28/15 1921 09/29/15 0513 09/30/15 0225 10/01/15 0535 10/02/15 0525 10/03/15 0520  WBC 9.2 8.3 11.4* 9.8 9.0  9.1  HGB 9.1* 7.8* 7.4* 7.6* 7.9* 8.9*  HCT 31.1* 26.1* 25.0* 25.7* 26.7* 29.4*  PLT 452* 415* 456* 486* 521* 585*  MCV 81.2 80.6 79.6 79.1 79.5 78.2  MCH 23.8* 24.1* 23.6* 23.4* 23.5* 23.7*  MCHC 29.3* 29.9* 29.6* 29.6* 29.6* 30.3  RDW 19.6* 19.6* 19.4* 19.1* 19.1* 18.9*  LYMPHSABS 1.8 0.3*  --   --   --   --   MONOABS 0.6 0.0*  --   --   --   --   EOSABS 0.0 0.0  --   --   --   --   BASOSABS 0.0 0.0  --   --   --   --     Chemistries   Recent Labs Lab 09/28/15 1921 09/29/15 0513 09/30/15 0225 10/01/15 0535 10/02/15 0525 10/03/15 0520  NA 141 138 138 135 138 136  K 3.8 4.0 4.4 4.5 4.8 4.8  CL 95* 97* 101 98* 96* 91*  CO2 33* 29 29 31  33* 32  GLUCOSE 161* 309* 206* 248* 228* 218*  BUN 18 15 15  22* 19 22*  CREATININE 0.83 0.82 0.69 0.90 0.82 0.89  CALCIUM 9.0 8.5* 8.6* 8.6* 8.9 8.8*  AST 18 21  --   --   --   --   ALT 13* 14  --   --   --   --   ALKPHOS 69 61  --   --   --   --   BILITOT 0.1* 0.4  --   --   --   --    ------------------------------------------------------------------------------------------------------------------ estimated creatinine clearance is 50.7 mL/min (by C-G formula based on Cr of 0.89). ------------------------------------------------------------------------------------------------------------------ No results for input(s): HGBA1C in the last 72  hours. ------------------------------------------------------------------------------------------------------------------ No results for input(s): CHOL, HDL, LDLCALC, TRIG, CHOLHDL, LDLDIRECT in the last 72 hours. ------------------------------------------------------------------------------------------------------------------ No results for input(s): TSH, T4TOTAL, T3FREE, THYROIDAB in the last 72 hours.  Invalid input(s): FREET3 ------------------------------------------------------------------------------------------------------------------  Recent Labs  09/30/15 1440  VITAMINB12 189  FOLATE 13.9  FERRITIN 37  TIBC 337  IRON 25*  RETICCTPCT 2.4    Coagulation profile  Recent Labs Lab 09/29/15 0513 09/30/15 0225 10/01/15 0535  INR 1.20 1.20 1.35    No results for input(s): DDIMER in the last 72 hours.  Cardiac Enzymes  Recent Labs Lab 09/30/15 0830 09/30/15 1440 09/30/15 1900  TROPONINI 0.15* 0.10* 0.13*   ------------------------------------------------------------------------------------------------------------------ Invalid input(s): POCBNP    Amaiya Scruton D.O. on 10/03/2015 at 12:15 PM  Between 7am to 7pm - Pager - [phone]  After 7pm go to [email or web address] - password [email or web address]  And look for the night coverage person covering for me after hours  [facility] Office  [phone]

## 2015-10-03 NOTE — Plan of Care (Signed)
Problem: Physical Regulation: Goal: Ability to maintain clinical measurements within normal limits will improve Outcome: Progressing ongoing Goal: Will remain free from infection Outcome: Progressing Tolerates antibiotic therapy  Problem: Skin Integrity: Goal: Risk for impaired skin integrity will decrease Outcome: Progressing Assess daily.

## 2015-10-04 LAB — BASIC METABOLIC PANEL
ANION GAP: 8 (ref 5–15)
BUN: 29 mg/dL — ABNORMAL HIGH (ref 6–20)
CHLORIDE: 91 mmol/L — AB (ref 101–111)
CO2: 37 mmol/L — AB (ref 22–32)
Calcium: 8.5 mg/dL — ABNORMAL LOW (ref 8.9–10.3)
Creatinine, Ser: 1.13 mg/dL — ABNORMAL HIGH (ref 0.44–1.00)
GFR calc non Af Amer: 47 mL/min — ABNORMAL LOW (ref >60)
GFR, EST AFRICAN AMERICAN: 54 mL/min — AB (ref >60)
Glucose, Bld: 208 mg/dL — ABNORMAL HIGH (ref 65–99)
POTASSIUM: 4.1 mmol/L (ref 3.5–5.1)
Sodium: 136 mmol/L (ref 135–145)

## 2015-10-04 LAB — CBC
HEMATOCRIT: 28.9 % — AB (ref 36.0–46.0)
HEMOGLOBIN: 8.5 g/dL — AB (ref 12.0–15.0)
MCH: 22.8 pg — ABNORMAL LOW (ref 26.0–34.0)
MCHC: 29.4 g/dL — ABNORMAL LOW (ref 30.0–36.0)
MCV: 77.7 fL — ABNORMAL LOW (ref 78.0–100.0)
Platelets: 577 10*3/uL — ABNORMAL HIGH (ref 150–400)
RBC: 3.72 MIL/uL — ABNORMAL LOW (ref 3.87–5.11)
RDW: 18.8 % — ABNORMAL HIGH (ref 11.5–15.5)
WBC: 10.1 10*3/uL (ref 4.0–10.5)

## 2015-10-04 MED ORDER — FUROSEMIDE 40 MG PO TABS: 40.0000 mg | ORAL_TABLET | Freq: Every day | ORAL | Status: DC

## 2015-10-04 MED ORDER — APIXABAN 5 MG PO TABS
5.0000 mg | ORAL_TABLET | Freq: Two times a day (BID) | ORAL | Status: DC
Start: 2015-10-04 — End: 2015-10-23

## 2015-10-04 MED ORDER — PREDNISONE 10 MG PO TABS: ORAL_TABLET | ORAL | Status: DC

## 2015-10-04 NOTE — Progress Notes (Signed)
Pharmacy Antibiotic Follow-up Note  Brenda Wells is a 75 y.o. year-old female admitted on 09/28/2015.  The patient is currently on day 6 of vancomycin and Zosyn for r/o sepsis, COPD exacerbation.  Culture negative  Assessment/Plan: After discussion with Dr. Catha GosselinMikhail, vancomycin and Zosyn will be discontinued because this patient has completed a total of 6 days of therapy and treatment is no longer indicated. - Culture negative.  Temp (24hrs), Avg:98 F (36.7 C), Min:97.6 F (36.4 C), Max:98.5 F (36.9 C)   Recent Labs Lab 09/30/15 0225 10/01/15 0535 10/02/15 0525 10/03/15 0520 10/04/15 0350  WBC 11.4* 9.8 9.0 9.1 10.1    Recent Labs Lab 09/30/15 0225 10/01/15 0535 10/02/15 0525 10/03/15 0520 10/04/15 0350  CREATININE 0.69 0.90 0.82 0.89 1.13*   Estimated Creatinine Clearance: 39.9 mL/min (by C-G formula based on Cr of 1.13).    No Known Allergies  Antimicrobials this admission: Vancomycin 1/11 >>1/16 Zosyn 1/11 >>1/16 LVQ x 1 on 1/11 CHG/Bactroban 1/12>>(1/16)  Levels/dose changes this admission:  1/14: VT 16 mcg/ml at 0525 on 750 mg IV q12h   Microbiology results: 1/10 Blood x 2 - negative 1/10 Urine - neg 12/15 (prior admit) MRSA screen POS  Thank you for allowing pharmacy to be a part of this patient's care.  Link SnufferJessica Bexley Mclester, PharmD, BCPS Clinical Pharmacist [phone] 10/04/2015 10:23 AM

## 2015-10-04 NOTE — Care Management Important Message (Signed)
Important Message  Patient Details  Name: Brenda Wells MRN: [id number] Date of Birth: Feb 07, 1941   Medicare Important Message Given:  Yes    [name], Omaya Nieland Kaye, RN 10/04/2015, 11:27 AM

## 2015-10-04 NOTE — Evaluation (Signed)
Occupational Therapy Evaluation and Discharge Patient Details Name: Brenda Wells MRN: [id number] DOB: [birth year] Today's Date: 10/04/2015    History of Present Illness Pt is a 75 y/o F on home oxygen 4 L [place] who presented w/ SOB.  Diagnosis: acute on chronic respiratory failure/COPD exacerbation/possible spesis.  Pt's PMH includes anemia, DHF, back surgery.   Clinical Impression   Pt was minimally assisted for sponge bathing PTA, otherwise functioned at modified independent level in self care.  She ambulated with a walker short distances and her family performed all IADL.  Pt presents with poor activity tolerance, generalized weakness, and impaired standing balance. Pt likely performing very close to her baseline.  Plan is to discharge home today. Pt is declining HHOT.     Follow Up Recommendations   (Pt is declining HHOT.)    Equipment Recommendations  None recommended by OT    Recommendations for Other Services       Precautions / Restrictions Precautions Precautions: Fall Precaution Comments: watch HR and sats Restrictions Weight Bearing Restrictions: No      Mobility Bed Mobility         Supine to sit: Modified independent (Device/Increase time)     General bed mobility comments: Increased time and use of bed rail  Transfers Overall transfer level: Modified independent Equipment used: None Transfers: Sit to/from [employer] Sit to Stand: Modified independent (Device/Increase time) Stand pivot transfers: Modified independent (Device/Increase time)            Balance             Standing balance-Leahy Scale: Fair                              ADL Overall ADL's : At baseline Eating/Feeding: Independent;Sitting   Grooming: Wash/dry hands;Wash/dry face;Brushing hair;Sitting;Set up Grooming Details (indicate cue type and reason): at sink Upper Body Bathing: Minimal assitance;Sitting   Lower Body Bathing: Set up;Sit  to/from stand   Upper Body Dressing : Set up;Sitting   Lower Body Dressing: Set up;Sit to/from stand   Toilet Transfer: Modified Administrator Details (indicate cue type and reason): bed to 3 in 1, pt has been performing routinely  Toileting- Clothing Manipulation and Hygiene: Modified independent;Sit to/from stand       Functional mobility during ADLs: Supervision/safety;Rolling walker General ADL Comments: Pt is aware of benefits of a 4 [employer]. Demonstrated pursed lip breathing. Pt has a pulse ox at home and monitors her levels.     Vision     Perception     Praxis      Pertinent Vitals/Pain Pain Assessment: No/denies pain     Hand Dominance Right   Extremity/Trunk Assessment Upper Extremity Assessment Upper Extremity Assessment: Overall WFL for tasks assessed   Lower Extremity Assessment Lower Extremity Assessment: Defer to PT evaluation       Communication Communication Communication: No difficulties   Cognition Arousal/Alertness: Awake/alert Behavior During Therapy: WFL for tasks assessed/performed Overall Cognitive Status: Within Functional Limits for tasks assessed                     General Comments       Exercises       Shoulder Instructions      Home Living Family/patient expects to be discharged to:: Private residence Living Arrangements: Children Available Help at Discharge: Family;Available 24 hours/day (son and son's girlfriend) Type of Home: House Home Access: Stairs  to enter Entrance Stairs-Number of Steps: 5 Entrance Stairs-Rails: Left;Right Home Layout: One level     Bathroom Shower/Tub: Walk-in shower;Tub/shower unit (pt sponge bathes)   Bathroom Toilet: Standard     Home Equipment: Walker - 2 wheels;Bedside commode;Wheelchair - [employer]manual;Walker - standard          Prior Functioning/Environment Level of Independence: Needs assistance  Gait / Transfers Assistance Needed: Ind w/ ambulating  short distance from bed to WC w/o AD.  Leaves her RW in between her WC and bed in case she needs it. ADL's / Homemaking Assistance Needed: sponge bath w/ assist of son's girlfriend to wash her back, family does cooking and cleaning        OT Diagnosis: Generalized weakness   OT Problem List:     OT Treatment/Interventions:      OT Goals(Current goals can be found in the care plan section) Acute Rehab OT Goals Patient Stated Goal: to go home  OT Frequency:     Barriers to D/C:            Co-evaluation              End of Session Equipment Utilized During Treatment: Oxygen;Rolling walker  Activity Tolerance: Patient limited by fatigue Patient left: in bed;with call bell/phone within reach   Time: [id number] OT Time Calculation (min): 19 min Charges:  OT General Charges $OT Visit: 1 Procedure OT Evaluation $OT Eval Moderate Complexity: 1 Procedure G-Codes:    Evern BioMayberry, Karlea Mckibbin Lynn 10/04/2015, 11:40 AM  [phone]

## 2015-10-04 NOTE — Progress Notes (Signed)
PT Cancellation Note  Patient Details Name: Brenda Wells MRN: [id number] DOB: 10/06/1940   Cancelled Treatment:    Pt states she is going home today and does not need or want any futher PT while in hospital   Cliftoneresa K. Manson PasseyBrown, PT  10/04/2015, 11:06 AM

## 2015-10-04 NOTE — Discharge Summary (Addendum)
Physician Discharge Summary  Brenda Wells ZOX:[id number] DOB: [birth year] DOA: 09/28/2015  PCP: [name], MD  Admit date: 09/28/2015 Discharge date: 10/04/2015  Time spent: 45 minutes  Recommendations for Outpatient Follow-up:  Patient will be discharged to home with home health PT and RN.  Patient will need to follow up with primary care provider within one week of discharge, repeat CBC and BMP.  Patient should continue medications as prescribed.  Patient should follow a heart healthy diet.   Discharge Diagnoses:  Acute on chronic respiratory failure/COPD exacerbation/heart failure exacerbation Patient fibrillation RVR Acute diastolic heart failure Elevated troponin History of DVT Chronic anemia Vitamin B-12 deficiency Goals of care  Physical deconditioning  Discharge Condition: Stable  Diet recommendation: Heart healthy  Filed Weights   10/02/15 0315 10/03/15 0447 10/04/15 0317  Weight: 64.139 kg (141 lb 6.4 oz) 61.871 kg (136 lb 6.4 oz) 61.961 kg (136 lb 9.6 oz)    History of present illness:  on 09/29/2015 by Dr. Midge Minium Brenda Wells is a 75 y.o. female history of COPD on home oxygen 4 L nasal cannula, diastolic CHF, chronic atrial fibrillation, history of DVT presents to the ER because of sudden onset of shortness of breath since yesterday afternoon after 4 PM. Denies any chest pain. Has been having some cough. Denies any fever or chills. Chest x-ray in the ER shows mild left pleural effusion. Patient was wheezing and required nebulizer treatment. Patient's heart rate on my exam has increased and was in A. fib with RVR. Patient denies any nausea vomiting or diarrhea. Lactic acid was elevated and patient was given 500 mL normal saline bolus. Patient will be admitted for acute respiratory failure probably from COPD exacerbation with possible developing sepsis.  Hospital Course:  Acute on chronic respiratory failure/COPD exacerbation/ CHF exac -Continue nebs,  Pulmicort, IV steroids, antibiotics-vancomycin/Zosyn -Lactic acid trending downward -Chest x-ray showed stable advanced emphysema with hyperinflation, small left pleural effusion without edema or focal infiltrate -CTA chest negative for pulmonary embolism or acute disease -Urine and blood cultures show no growth to date -CXR 10/01/2015: Pulm venous congestion, small left effusion, COPD -PT consulted for eval and treatment- recommended HH  Atrial fibrillation with RVR -Likely secondary to the above -Patient more rate controlled -It seems that patient's INR was difficult to control as an outpatient and she was supposed to be switched to Eliquis, but did not start as she had just had her coumadin filled -INR subtherapeutic- discontinued Coumadin and heparin -Continue Cardizem -Continue Eliquis (started on 10/01/15)  Acute diastolic heart failure -Echocardiogram 07/22/2015 shows an EF of 50-55% -continue to monitor intake and output, daily weights -UOP 7000cc over past 24hrs  Elevated troponin -Peaked at 0.15, trended and remaining fairly flat -Likely secondary to demand ischemia and Afib RVR  History of DVT -Continue Eliquis  Chronic anemia -Hemoglobin currently 8.5, baseline appears to be an 8-9 -Anemia panel: Iron 25, Ferritin 37, B12 189 -FOBT pending -Given dose of feraheme (patient states Iron supplements upset her stomach) -Patient will need to follow up with PCP -Repeat CBC in one week  Vitamin B12 def -B12 189 -given Vit B12 injection  Goals of care -Patient has been admitted 4 times in 2 months for COPD exac -Palliative care consulted and appreciated -Patient referred to hospice, but not ready for their services -Patient understand that her breathing may never improve  Physical deconditioning -PT consulted and appreciated- rec HH and OT consult -OT consulted and pending  Code Status: DNR  Procedures  None  Consults  Palliative care  Discharge  Exam: Filed Vitals:   10/04/15 0317 10/04/15 0800  BP: 118/55   Pulse: 61   Temp: 98.3 F (36.8 C) 98.1 F (36.7 C)  Resp: 22    Exam  General: Well developed, chronically ill, NAD  HEENT: NCAT, mucous membranes moist.   Cardiovascular: S1 S2 auscultated, RRR  Respiratory: Diminished breath sounds, however clear, occ cough  Abdomen: Soft, nontender, nondistended, + bowel sounds  Extremities: warm dry without cyanosis clubbing or edema  Neuro: AAOx3, nonfocal  Psych: Normal affect and demeanor, pleasant  Discharge Instructions      Discharge Instructions    Discharge instructions    Complete by:  As directed   Patient will be discharged to home with home health PT and RN.  Patient will need to follow up with primary care provider within one week of discharge, repeat CBC and BMP.  Patient should continue medications as prescribed.  Patient should follow a heart healthy diet.            Medication List    STOP taking these medications        warfarin 5 MG tablet  Commonly known as:  COUMADIN      TAKE these medications        albuterol (2.5 MG/3ML) 0.083% nebulizer solution  Commonly known as:  PROVENTIL  Take 3 mLs (2.5 mg total) by nebulization every 2 (two) hours as needed for wheezing or shortness of breath.     apixaban 5 MG Tabs tablet  Commonly known as:  ELIQUIS  Take 1 tablet (5 mg total) by mouth 2 (two) times daily.     arformoterol 15 MCG/2ML Nebu  Commonly known as:  BROVANA  Take 2 mLs (15 mcg total) by nebulization 2 (two) times daily.     budesonide 0.25 MG/2ML nebulizer solution  Commonly known as:  PULMICORT  Take 2 mLs (0.25 mg total) by nebulization 2 (two) times daily.     diltiazem 180 MG 24 hr capsule  Commonly known as:  CARDIZEM CD  Take 1 capsule (180 mg total) by mouth daily.     Fluticasone-Salmeterol 500-50 MCG/DOSE Aepb  Commonly known as:  ADVAIR  Inhale 1 puff into the lungs 2 (two) times daily.     furosemide 40  MG tablet  Commonly known as:  LASIX  Take 1 tablet (40 mg total) by mouth daily.     guaiFENesin 600 MG 12 hr tablet  Commonly known as:  MUCINEX  Take 2 tablets (1,200 mg total) by mouth 2 (two) times daily.     predniSONE 10 MG tablet  Commonly known as:  DELTASONE  Take 40mg  (4 tabs) x 3 days, then taper to 30mg  (3 tabs) x 3 days, then 20mg  (2 tabs) x 3days, then 10mg  (1 tab) x 3days, then OFF.     SPIRIVA HANDIHALER 18 MCG inhalation capsule  Generic drug:  tiotropium  Place 18 mcg into inhaler and inhale daily.       No Known Allergies Follow-up Information    Follow up with [name],[name], MD. Go on 10/11/2015.   Specialty:  Family Medicine   Why:  Hospital follow up @ 10am       The results of significant diagnostics from this hospitalization (including imaging, microbiology, ancillary and laboratory) are listed below for reference.    Significant Diagnostic Studies: Dg Chest 2 View  09/28/2015  CLINICAL DATA:  Shortness of breath, weakness and history of  COPD. EXAM: CHEST - 2 VIEW COMPARISON:  09/13/2015 FINDINGS: Stable advanced emphysematous lung disease with moderate bilateral hyperinflation. There is no evidence of pulmonary edema, consolidation, pneumothorax or nodule. Tiny left pleural effusion present. Stable top-normal heart size. IMPRESSION: Stable advanced emphysema with hyperinflation. Small left pleural effusion without overt pulmonary edema or focal infiltrate. Electronically Signed   By: Irish Lack M.D.   On: 09/28/2015 20:39   Ct Angio Chest Pe W/cm &/or Wo Cm  09/29/2015  CLINICAL DATA:  Patient with a DVT in the right lower extremity. History of COPD. Worsening shortness of breath. Initial encounter. EXAM: CT ANGIOGRAPHY CHEST WITH CONTRAST TECHNIQUE: Multidetector CT imaging of the chest was performed using the standard protocol during bolus administration of intravenous contrast. Multiplanar CT image reconstructions and MIPs were obtained to  evaluate the vascular anatomy. CONTRAST:  80 mL OMNIPAQUE IOHEXOL 350 MG/ML SOLN COMPARISON:  CT chest 07/22/2015.  PA and lateral chest 09/28/2015. FINDINGS: No pulmonary embolus is identified. Extensive aortic and coronary atherosclerosis is seen. No pathologic lymphadenopathy by CT size criteria in the axilla, hila or mediastinum is identified. Very small bilateral pleural effusions are seen and decreased since the prior CT. Heart size is mildly enlarged. The lungs demonstrate extensive emphysematous change. Chronic atelectasis in the left lower lobe is unchanged. Volume loss in the right middle lobe seen on the prior CT has resolved. The lungs are otherwise clear. Visualized upper abdomen shows an unchanged low-attenuation lesion in the caudate lobe of the liver most consistent with a cyst. No focal bony abnormality is identified. Review of the MIP images confirms the above findings. IMPRESSION: Negative for pulmonary embolus or acute disease. Trace bilateral pleural effusions are decreased since the prior CT. Emphysema. Mild cardiomegaly. Calcific aortic and coronary atherosclerosis. Electronically Signed   By: Drusilla Kanner M.D.   On: 09/29/2015 09:01   Dg Chest Port 1 View  10/01/2015  CLINICAL DATA:  Worsening shortness of breath EXAM: PORTABLE CHEST 1 VIEW COMPARISON:  09/28/2015 FINDINGS: Chronic cardiopericardial enlargement. Stable aortic contours. There is a small left pleural effusion. Interstitial coarsening without edema. No consolidation or pneumothorax. IMPRESSION: 1. Pulmonary venous congestion and small left effusion. 2. COPD. Electronically Signed   By: Marnee Spring M.D.   On: 10/01/2015 13:17   Dg Chest Port 1 View  09/13/2015  CLINICAL DATA:  COPD.  Follow-up shortness of breath and cough. EXAM: PORTABLE CHEST 1 VIEW COMPARISON:  September 09, 2015 FINDINGS: The heart size and mediastinal contours are stable. The heart size is enlarged. There is a small left pleural effusion. There  is no focal pneumonia or pulmonary edema. The lungs are hyperinflated. The visualized skeletal structures are stable. IMPRESSION: No focal pneumonia.  Small left pleural effusion.  Emphysema. Electronically Signed   By: Sherian Rein M.D.   On: 09/13/2015 08:27   Dg Chest Port 1 View  09/09/2015  CLINICAL DATA:  Shortness of breath today. Patient discharge from the hospital 09/07/2015. EXAM: PORTABLE CHEST 1 VIEW COMPARISON:  CT chest 07/22/2015. Single view of the chest 09/03/2015. FINDINGS: The lungs are emphysematous but clear. Heart size is enlarged. No pneumothorax or pleural effusion. Atherosclerosis noted. IMPRESSION: Emphysema without acute disease. Cardiomegaly. Atherosclerosis. Electronically Signed   By: Drusilla Kanner M.D.   On: 09/09/2015 14:15    Microbiology: Recent Results (from the past 240 hour(s))  Culture, blood (routine x 2)     Status: None   Collection Time: 09/28/15  7:30 PM  Result Value Ref Range  Status   Specimen Description BLOOD RIGHT ANTECUBITAL  Final   Special Requests BOTTLES DRAWN AEROBIC AND ANAEROBIC 5CC  Final   Culture NO GROWTH 5 DAYS  Final   Report Status 10/03/2015 FINAL  Final  Culture, blood (routine x 2)     Status: None   Collection Time: 09/28/15  7:45 PM  Result Value Ref Range Status   Specimen Description BLOOD LEFT ANTECUBITAL  Final   Special Requests BOTTLES DRAWN AEROBIC AND ANAEROBIC 5CC  Final   Culture NO GROWTH 5 DAYS  Final   Report Status 10/03/2015 FINAL  Final  Urine culture     Status: None   Collection Time: 09/28/15  9:32 PM  Result Value Ref Range Status   Specimen Description URINE, CATHETERIZED  Final   Special Requests NONE  Final   Culture NO GROWTH 2 DAYS  Final   Report Status 09/30/2015 FINAL  Final     Labs: Basic Metabolic Panel:  Recent Labs Lab 09/30/15 0225 10/01/15 0535 10/02/15 0525 10/03/15 0520 10/04/15 0350  NA 138 135 138 136 136  K 4.4 4.5 4.8 4.8 4.1  CL 101 98* 96* 91* 91*  CO2 29 31  33* 32 37*  GLUCOSE 206* 248* 228* 218* 208*  BUN 15 22* 19 22* 29*  CREATININE 0.69 0.90 0.82 0.89 1.13*  CALCIUM 8.6* 8.6* 8.9 8.8* 8.5*   Liver Function Tests:  Recent Labs Lab 09/28/15 1921 09/29/15 0513  AST 18 21  ALT 13* 14  ALKPHOS 69 61  BILITOT 0.1* 0.4  PROT 6.0* 5.5*  ALBUMIN 2.7* 2.4*   No results for input(s): LIPASE, AMYLASE in the last 168 hours. No results for input(s): AMMONIA in the last 168 hours. CBC:  Recent Labs Lab 09/28/15 1921 09/29/15 0513 09/30/15 0225 10/01/15 0535 10/02/15 0525 10/03/15 0520 10/04/15 0350  WBC 9.2 8.3 11.4* 9.8 9.0 9.1 10.1  NEUTROABS 6.7 8.0*  --   --   --   --   --   HGB 9.1* 7.8* 7.4* 7.6* 7.9* 8.9* 8.5*  HCT 31.1* 26.1* 25.0* 25.7* 26.7* 29.4* 28.9*  MCV 81.2 80.6 79.6 79.1 79.5 78.2 77.7*  PLT 452* 415* 456* 486* 521* 585* 577*   Cardiac Enzymes:  Recent Labs Lab 09/29/15 1009 09/29/15 1620 09/30/15 0830 09/30/15 1440 09/30/15 1900  TROPONINI <0.03 0.05* 0.15* 0.10* 0.13*   BNP: BNP (last 3 results)  Recent Labs  07/21/15 1638 09/02/15 0124 09/09/15 1350  BNP 102.0* 64.9 102.1*    ProBNP (last 3 results) No results for input(s): PROBNP in the last 8760 hours.  CBG: No results for input(s): GLUCAP in the last 168 hours.     SignedEdsel Petrin  [facility] 10/04/2015, 1:27 PM

## 2015-10-04 NOTE — Discharge Instructions (Signed)
Respiratory failure is when your lungs are not working well and your breathing (respiratory) system fails. When respiratory failure occurs, it is difficult for your lungs to get enough oxygen, get rid of carbon dioxide, or both. Respiratory failure can be life threatening.  °Respiratory failure can be acute or chronic. Acute respiratory failure is sudden, severe, and requires emergency medical treatment. Chronic respiratory failure is less severe, happens over time, and requires ongoing treatment.  °WHAT ARE THE CAUSES OF ACUTE RESPIRATORY FAILURE?  °Any problem affecting the heart or lungs can cause acute respiratory failure. Some of these causes include the following: °· Chronic bronchitis and emphysema (COPD).   °· Blood clot going to a lung (pulmonary embolism).   °· Having water in the lungs caused by heart failure, lung injury, or infection (pulmonary edema).   °· Collapsed lung (pneumothorax).   °· Pneumonia.   °· Pulmonary fibrosis.   °· Obesity.   °· Asthma.   °· Heart failure.   °· Any type of trauma to the chest that can make breathing difficult.   °· Nerve or muscle diseases making chest movements difficult. °HOW WILL MY ACUTE RESPIRATORY FAILURE BE TREATED?  °Treatment of acute respiratory failure depends on the cause of the respiratory failure. Usually, you will stay in the intensive care unit so your breathing can be watched closely. Treatment can include the following: °· Oxygen. Oxygen can be delivered through the following: °¨ Nasal cannula. This is small tubing that goes in your nose to give you oxygen. °¨ Face mask. A face mask covers your nose and mouth to give you oxygen. °· Medicine. Different medicines can be given to help with breathing. These can include: °¨ Nebulizers. Nebulizers deliver medicines to open the air passages (bronchodilators). These medicines help to open or relax the airways in the lungs so you can breathe better. They can also help loosen mucus from your  lungs. °¨ Diuretics. Diuretic medicines can help you breathe better by getting rid of extra water in your body. °¨ Steroids. Steroid medicines can help decrease swelling (inflammation) in your lungs. °¨ Antibiotics. °· Chest tube. If you have a collapsed lung (pneumothorax), a chest tube is placed to help reinflate the lung. °· Noninvasive positive pressure ventilation (NPPV). This is a tight-fitting mask that goes over your nose and mouth. The mask has tubing that is attached to a machine. The machine blows air into the tubing, which helps to keep the tiny air sacs (alveoli) in your lungs open. This machine allows you to breathe on your own. °· Ventilator. A ventilator is a breathing machine. When on a ventilator, a breathing tube is put into the lungs. A ventilator is used when you can no longer breathe well enough on your own. You may have low oxygen levels or high carbon dioxide (CO2) levels in your blood. When you are on a ventilator, sedation and pain medicines are given to make you sleep so your lungs can heal. °SEEK IMMEDIATE MEDICAL CARE IF: °· You have shortness of breath (dyspnea) with or without activity. °· You have rapid breathing (tachypnea). °· You are wheezing. °· You are unable to say more than a few words without having to catch your breath. °· You find it very difficult to function normally. °· You have a fast heart rate. °· You have a bluish color to your finger or toe nail beds. °· You have confusion or drowsiness or both. °  °This information is not intended to replace advice given to you by your health care provider. Make sure you discuss   any questions you have with your health care provider.   Document Released: 09/09/2013 Document Revised: 05/26/2015 Document Reviewed: 09/09/2013 Elsevier Interactive Patient Education 2016 [employer]  Information on my medicine - ELIQUIS (apixaban)  This medication education was reviewed with me or my healthcare representative as part of my  discharge preparation.  The pharmacist that spoke with me during my hospital stay was:  Fayne NorrieMillen, Marjan Rosman Brown, [facility]  Why was Eliquis prescribed for you? Eliquis was prescribed for you to reduce the risk of a blood clot forming that can cause a stroke if you have a medical condition called atrial fibrillation (a type of irregular heartbeat).  What do You need to know about Eliquis ? Take your Eliquis TWICE DAILY - one tablet in the morning and one tablet in the evening with or without food. If you have difficulty swallowing the tablet whole please discuss with your pharmacist how to take the medication safely.  Take Eliquis exactly as prescribed by your doctor and DO NOT stop taking Eliquis without talking to the doctor who prescribed the medication.  Stopping may increase your risk of developing a stroke.  Refill your prescription before you run out.  After discharge, you should have regular check-up appointments with your healthcare provider that is prescribing your Eliquis.  In the future your dose may need to be changed if your kidney function or weight changes by a significant amount or as you get older.  What do you do if you miss a dose? If you miss a dose, take it as soon as you remember on the same day and resume taking twice daily.  Do not take more than one dose of ELIQUIS at the same time to make up a missed dose.  Important Safety Information A possible side effect of Eliquis is bleeding. You should call your healthcare provider right away if you experience any of the following: ? Bleeding from an injury or your nose that does not stop. ? Unusual colored urine (red or dark brown) or unusual colored stools (red or black). ? Unusual bruising for unknown reasons. ? A serious fall or if you hit your head (even if there is no bleeding).  Some medicines may interact with Eliquis and might increase your risk of bleeding or clotting while on Eliquis. To help avoid this, consult  your healthcare provider or pharmacist prior to using any new prescription or non-prescription medications, including herbals, vitamins, non-steroidal anti-inflammatory drugs (NSAIDs) and supplements.  This website has more information on Eliquis (apixaban): http://www.eliquis.com/eliquis/home

## 2015-10-05 ENCOUNTER — Inpatient Hospital Stay: Payer: Medicare Other | Admitting: Pulmonary Disease

## 2015-10-05 DIAGNOSIS — E119 Type 2 diabetes mellitus without complications: Secondary | ICD-10-CM | POA: Diagnosis not present

## 2015-10-05 DIAGNOSIS — I482 Chronic atrial fibrillation: Secondary | ICD-10-CM | POA: Diagnosis not present

## 2015-10-05 DIAGNOSIS — I5032 Chronic diastolic (congestive) heart failure: Secondary | ICD-10-CM | POA: Diagnosis not present

## 2015-10-05 DIAGNOSIS — J9610 Chronic respiratory failure, unspecified whether with hypoxia or hypercapnia: Secondary | ICD-10-CM | POA: Diagnosis not present

## 2015-10-05 DIAGNOSIS — M6281 Muscle weakness (generalized): Secondary | ICD-10-CM | POA: Diagnosis not present

## 2015-10-05 DIAGNOSIS — I110 Hypertensive heart disease with heart failure: Secondary | ICD-10-CM | POA: Diagnosis not present

## 2015-10-05 DIAGNOSIS — R2689 Other abnormalities of gait and mobility: Secondary | ICD-10-CM | POA: Diagnosis not present

## 2015-10-05 DIAGNOSIS — J441 Chronic obstructive pulmonary disease with (acute) exacerbation: Secondary | ICD-10-CM | POA: Diagnosis not present

## 2015-10-05 DIAGNOSIS — Z7952 Long term (current) use of systemic steroids: Secondary | ICD-10-CM | POA: Diagnosis not present

## 2015-10-05 DIAGNOSIS — Z7901 Long term (current) use of anticoagulants: Secondary | ICD-10-CM | POA: Diagnosis not present

## 2015-10-08 DIAGNOSIS — Z7901 Long term (current) use of anticoagulants: Secondary | ICD-10-CM | POA: Diagnosis not present

## 2015-10-08 DIAGNOSIS — J440 Chronic obstructive pulmonary disease with acute lower respiratory infection: Secondary | ICD-10-CM | POA: Diagnosis not present

## 2015-10-08 DIAGNOSIS — J441 Chronic obstructive pulmonary disease with (acute) exacerbation: Secondary | ICD-10-CM | POA: Diagnosis not present

## 2015-10-08 DIAGNOSIS — I482 Chronic atrial fibrillation: Secondary | ICD-10-CM | POA: Diagnosis not present

## 2015-10-08 DIAGNOSIS — E119 Type 2 diabetes mellitus without complications: Secondary | ICD-10-CM | POA: Diagnosis not present

## 2015-10-08 DIAGNOSIS — J9610 Chronic respiratory failure, unspecified whether with hypoxia or hypercapnia: Secondary | ICD-10-CM | POA: Diagnosis not present

## 2015-10-08 DIAGNOSIS — I5032 Chronic diastolic (congestive) heart failure: Secondary | ICD-10-CM | POA: Diagnosis not present

## 2015-10-08 DIAGNOSIS — R2689 Other abnormalities of gait and mobility: Secondary | ICD-10-CM | POA: Diagnosis not present

## 2015-10-08 DIAGNOSIS — Z7952 Long term (current) use of systemic steroids: Secondary | ICD-10-CM | POA: Diagnosis not present

## 2015-10-08 DIAGNOSIS — I110 Hypertensive heart disease with heart failure: Secondary | ICD-10-CM | POA: Diagnosis not present

## 2015-10-08 DIAGNOSIS — M6281 Muscle weakness (generalized): Secondary | ICD-10-CM | POA: Diagnosis not present

## 2015-10-12 DIAGNOSIS — E119 Type 2 diabetes mellitus without complications: Secondary | ICD-10-CM | POA: Diagnosis not present

## 2015-10-12 DIAGNOSIS — Z7901 Long term (current) use of anticoagulants: Secondary | ICD-10-CM | POA: Diagnosis not present

## 2015-10-12 DIAGNOSIS — J9610 Chronic respiratory failure, unspecified whether with hypoxia or hypercapnia: Secondary | ICD-10-CM | POA: Diagnosis not present

## 2015-10-12 DIAGNOSIS — M6281 Muscle weakness (generalized): Secondary | ICD-10-CM | POA: Diagnosis not present

## 2015-10-12 DIAGNOSIS — Z7952 Long term (current) use of systemic steroids: Secondary | ICD-10-CM | POA: Diagnosis not present

## 2015-10-12 DIAGNOSIS — R2689 Other abnormalities of gait and mobility: Secondary | ICD-10-CM | POA: Diagnosis not present

## 2015-10-12 DIAGNOSIS — J441 Chronic obstructive pulmonary disease with (acute) exacerbation: Secondary | ICD-10-CM | POA: Diagnosis not present

## 2015-10-12 DIAGNOSIS — I482 Chronic atrial fibrillation: Secondary | ICD-10-CM | POA: Diagnosis not present

## 2015-10-12 DIAGNOSIS — I110 Hypertensive heart disease with heart failure: Secondary | ICD-10-CM | POA: Diagnosis not present

## 2015-10-12 DIAGNOSIS — I5032 Chronic diastolic (congestive) heart failure: Secondary | ICD-10-CM | POA: Diagnosis not present

## 2015-10-14 DIAGNOSIS — R2689 Other abnormalities of gait and mobility: Secondary | ICD-10-CM | POA: Diagnosis not present

## 2015-10-14 DIAGNOSIS — J9610 Chronic respiratory failure, unspecified whether with hypoxia or hypercapnia: Secondary | ICD-10-CM | POA: Diagnosis not present

## 2015-10-14 DIAGNOSIS — J441 Chronic obstructive pulmonary disease with (acute) exacerbation: Secondary | ICD-10-CM | POA: Diagnosis not present

## 2015-10-14 DIAGNOSIS — I110 Hypertensive heart disease with heart failure: Secondary | ICD-10-CM | POA: Diagnosis not present

## 2015-10-14 DIAGNOSIS — Z7952 Long term (current) use of systemic steroids: Secondary | ICD-10-CM | POA: Diagnosis not present

## 2015-10-14 DIAGNOSIS — E119 Type 2 diabetes mellitus without complications: Secondary | ICD-10-CM | POA: Diagnosis not present

## 2015-10-14 DIAGNOSIS — I482 Chronic atrial fibrillation: Secondary | ICD-10-CM | POA: Diagnosis not present

## 2015-10-14 DIAGNOSIS — M6281 Muscle weakness (generalized): Secondary | ICD-10-CM | POA: Diagnosis not present

## 2015-10-14 DIAGNOSIS — I5032 Chronic diastolic (congestive) heart failure: Secondary | ICD-10-CM | POA: Diagnosis not present

## 2015-10-14 DIAGNOSIS — Z7901 Long term (current) use of anticoagulants: Secondary | ICD-10-CM | POA: Diagnosis not present

## 2015-10-18 DIAGNOSIS — E119 Type 2 diabetes mellitus without complications: Secondary | ICD-10-CM | POA: Diagnosis not present

## 2015-10-18 DIAGNOSIS — J441 Chronic obstructive pulmonary disease with (acute) exacerbation: Secondary | ICD-10-CM | POA: Diagnosis not present

## 2015-10-18 DIAGNOSIS — R2689 Other abnormalities of gait and mobility: Secondary | ICD-10-CM | POA: Diagnosis not present

## 2015-10-18 DIAGNOSIS — I110 Hypertensive heart disease with heart failure: Secondary | ICD-10-CM | POA: Diagnosis not present

## 2015-10-18 DIAGNOSIS — Z7952 Long term (current) use of systemic steroids: Secondary | ICD-10-CM | POA: Diagnosis not present

## 2015-10-18 DIAGNOSIS — I482 Chronic atrial fibrillation: Secondary | ICD-10-CM | POA: Diagnosis not present

## 2015-10-18 DIAGNOSIS — J9610 Chronic respiratory failure, unspecified whether with hypoxia or hypercapnia: Secondary | ICD-10-CM | POA: Diagnosis not present

## 2015-10-18 DIAGNOSIS — I5032 Chronic diastolic (congestive) heart failure: Secondary | ICD-10-CM | POA: Diagnosis not present

## 2015-10-18 DIAGNOSIS — M6281 Muscle weakness (generalized): Secondary | ICD-10-CM | POA: Diagnosis not present

## 2015-10-18 DIAGNOSIS — Z7901 Long term (current) use of anticoagulants: Secondary | ICD-10-CM | POA: Diagnosis not present

## 2015-10-19 DIAGNOSIS — M6281 Muscle weakness (generalized): Secondary | ICD-10-CM | POA: Diagnosis not present

## 2015-10-19 DIAGNOSIS — J9610 Chronic respiratory failure, unspecified whether with hypoxia or hypercapnia: Secondary | ICD-10-CM | POA: Diagnosis not present

## 2015-10-19 DIAGNOSIS — R2689 Other abnormalities of gait and mobility: Secondary | ICD-10-CM | POA: Diagnosis not present

## 2015-10-19 DIAGNOSIS — Z7901 Long term (current) use of anticoagulants: Secondary | ICD-10-CM | POA: Diagnosis not present

## 2015-10-19 DIAGNOSIS — Z7952 Long term (current) use of systemic steroids: Secondary | ICD-10-CM | POA: Diagnosis not present

## 2015-10-19 DIAGNOSIS — I110 Hypertensive heart disease with heart failure: Secondary | ICD-10-CM | POA: Diagnosis not present

## 2015-10-19 DIAGNOSIS — I482 Chronic atrial fibrillation: Secondary | ICD-10-CM | POA: Diagnosis not present

## 2015-10-19 DIAGNOSIS — J441 Chronic obstructive pulmonary disease with (acute) exacerbation: Secondary | ICD-10-CM | POA: Diagnosis not present

## 2015-10-19 DIAGNOSIS — E119 Type 2 diabetes mellitus without complications: Secondary | ICD-10-CM | POA: Diagnosis not present

## 2015-10-19 DIAGNOSIS — I5032 Chronic diastolic (congestive) heart failure: Secondary | ICD-10-CM | POA: Diagnosis not present

## 2015-10-20 ENCOUNTER — Encounter (HOSPITAL_COMMUNITY): Payer: Self-pay | Admitting: Emergency Medicine

## 2015-10-20 ENCOUNTER — Inpatient Hospital Stay (HOSPITAL_COMMUNITY)
Admission: EM | Admit: 2015-10-20 | Discharge: 2015-10-23 | DRG: 190 | Disposition: A | Discharge status: Hospice | Payer: Medicare Other | Attending: Internal Medicine | Admitting: Internal Medicine

## 2015-10-20 ENCOUNTER — Emergency Department (HOSPITAL_COMMUNITY): Payer: Medicare Other

## 2015-10-20 DIAGNOSIS — R739 Hyperglycemia, unspecified: Secondary | ICD-10-CM | POA: Diagnosis present

## 2015-10-20 DIAGNOSIS — I5032 Chronic diastolic (congestive) heart failure: Secondary | ICD-10-CM | POA: Diagnosis present

## 2015-10-20 DIAGNOSIS — Z66 Do not resuscitate: Secondary | ICD-10-CM | POA: Diagnosis present

## 2015-10-20 DIAGNOSIS — D72829 Elevated white blood cell count, unspecified: Secondary | ICD-10-CM | POA: Diagnosis present

## 2015-10-20 DIAGNOSIS — Z9981 Dependence on supplemental oxygen: Secondary | ICD-10-CM

## 2015-10-20 DIAGNOSIS — J449 Chronic obstructive pulmonary disease, unspecified: Secondary | ICD-10-CM | POA: Diagnosis not present

## 2015-10-20 DIAGNOSIS — F17210 Nicotine dependence, cigarettes, uncomplicated: Secondary | ICD-10-CM | POA: Diagnosis not present

## 2015-10-20 DIAGNOSIS — R069 Unspecified abnormalities of breathing: Secondary | ICD-10-CM | POA: Diagnosis not present

## 2015-10-20 DIAGNOSIS — R05 Cough: Secondary | ICD-10-CM | POA: Diagnosis not present

## 2015-10-20 DIAGNOSIS — J441 Chronic obstructive pulmonary disease with (acute) exacerbation: Secondary | ICD-10-CM | POA: Diagnosis not present

## 2015-10-20 DIAGNOSIS — Z515 Encounter for palliative care: Secondary | ICD-10-CM | POA: Diagnosis present

## 2015-10-20 DIAGNOSIS — T380X5A Adverse effect of glucocorticoids and synthetic analogues, initial encounter: Secondary | ICD-10-CM | POA: Diagnosis present

## 2015-10-20 DIAGNOSIS — Z7901 Long term (current) use of anticoagulants: Secondary | ICD-10-CM

## 2015-10-20 DIAGNOSIS — J9621 Acute and chronic respiratory failure with hypoxia: Secondary | ICD-10-CM | POA: Diagnosis present

## 2015-10-20 DIAGNOSIS — Z7189 Other specified counseling: Secondary | ICD-10-CM | POA: Insufficient documentation

## 2015-10-20 DIAGNOSIS — I482 Chronic atrial fibrillation, unspecified: Secondary | ICD-10-CM | POA: Diagnosis present

## 2015-10-20 DIAGNOSIS — Z86718 Personal history of other venous thrombosis and embolism: Secondary | ICD-10-CM

## 2015-10-20 DIAGNOSIS — I110 Hypertensive heart disease with heart failure: Secondary | ICD-10-CM | POA: Diagnosis present

## 2015-10-20 DIAGNOSIS — D649 Anemia, unspecified: Secondary | ICD-10-CM | POA: Diagnosis present

## 2015-10-20 DIAGNOSIS — Z6821 Body mass index (BMI) 21.0-21.9, adult: Secondary | ICD-10-CM

## 2015-10-20 LAB — CBC WITH DIFFERENTIAL/PLATELET
BASOS PCT: 0 %
Basophils Absolute: 0 10*3/uL (ref 0.0–0.1)
EOS PCT: 0 %
Eosinophils Absolute: 0 10*3/uL (ref 0.0–0.7)
HEMATOCRIT: 34.2 % — AB (ref 36.0–46.0)
Hemoglobin: 10.3 g/dL — ABNORMAL LOW (ref 12.0–15.0)
LYMPHS ABS: 3.6 10*3/uL (ref 0.7–4.0)
Lymphocytes Relative: 22 %
MCH: 25.1 pg — AB (ref 26.0–34.0)
MCHC: 30.1 g/dL (ref 30.0–36.0)
MCV: 83.4 fL (ref 78.0–100.0)
MONO ABS: 1 10*3/uL (ref 0.1–1.0)
MONOS PCT: 6 %
NEUTROS ABS: 11.6 10*3/uL — AB (ref 1.7–7.7)
Neutrophils Relative %: 72 %
Platelets: 256 10*3/uL (ref 150–400)
RBC: 4.1 MIL/uL (ref 3.87–5.11)
RDW: 24 % — AB (ref 11.5–15.5)
WBC: 16.2 10*3/uL — ABNORMAL HIGH (ref 4.0–10.5)

## 2015-10-20 LAB — I-STAT ARTERIAL BLOOD GAS, ED
ACID-BASE EXCESS: 13 mmol/L — AB (ref 0.0–2.0)
Bicarbonate: 38.5 mEq/L — ABNORMAL HIGH (ref 20.0–24.0)
O2 SAT: 99 %
TCO2: 40 mmol/L (ref 0–100)
pCO2 arterial: 54.6 mmHg — ABNORMAL HIGH (ref 35.0–45.0)
pH, Arterial: 7.457 — ABNORMAL HIGH (ref 7.350–7.450)
pO2, Arterial: 124 mmHg — ABNORMAL HIGH (ref 80.0–100.0)

## 2015-10-20 LAB — BASIC METABOLIC PANEL
ANION GAP: 14 (ref 5–15)
BUN: 29 mg/dL — ABNORMAL HIGH (ref 6–20)
CALCIUM: 9 mg/dL (ref 8.9–10.3)
CO2: 34 mmol/L — AB (ref 22–32)
Chloride: 93 mmol/L — ABNORMAL LOW (ref 101–111)
Creatinine, Ser: 1.11 mg/dL — ABNORMAL HIGH (ref 0.44–1.00)
GFR calc non Af Amer: 48 mL/min — ABNORMAL LOW (ref >60)
GFR, EST AFRICAN AMERICAN: 55 mL/min — AB (ref >60)
Glucose, Bld: 143 mg/dL — ABNORMAL HIGH (ref 65–99)
Potassium: 3.5 mmol/L (ref 3.5–5.1)
Sodium: 141 mmol/L (ref 135–145)

## 2015-10-20 LAB — I-STAT TROPONIN, ED: TROPONIN I, POC: 0.02 ng/mL (ref 0.00–0.08)

## 2015-10-20 LAB — BRAIN NATRIURETIC PEPTIDE: B NATRIURETIC PEPTIDE 5: 60.1 pg/mL (ref 0.0–100.0)

## 2015-10-20 MED ORDER — ALBUTEROL SULFATE (2.5 MG/3ML) 0.083% IN NEBU
5.0000 mg | INHALATION_SOLUTION | Freq: Once | RESPIRATORY_TRACT | Status: AC
  Administered 2015-10-20: 5 mg via RESPIRATORY_TRACT
  Filled 2015-10-20: qty 6

## 2015-10-20 MED ORDER — LEVOFLOXACIN IN D5W 500 MG/100ML IV SOLN
500.0000 mg | Freq: Once | INTRAVENOUS | Status: DC
  Filled 2015-10-20: qty 100

## 2015-10-20 MED ORDER — PREDNISONE 20 MG PO TABS: 60.0000 mg | ORAL_TABLET | Freq: Once | ORAL | Status: DC

## 2015-10-20 MED ORDER — PREDNISONE 20 MG PO TABS: 40.0000 mg | ORAL_TABLET | Freq: Once | ORAL | Status: DC

## 2015-10-20 NOTE — ED Provider Notes (Signed)
CSN: [id number]     Arrival date & time 10/20/15  2211 History   First MD Initiated Contact with Patient 10/20/15 2213     Chief Complaint  Patient presents with  . Respiratory Distress     (Consider location/radiation/quality/duration/timing/severity/associated sxs/prior Treatment) HPI Brenda Wells is a 75 year old female with a history of COPD, congestive heart failure, and chronic A. fib who was recently discharged from hospital after an episode of acute on chronic respiratory failure who presents today complaining of increased dyspnea this evening. She reports that she had been feeling better since her discharge and had completed all of her discharge medications. She is on 4 L of oxygen at home. This evening she had increased weakness and shortness of breath. She noted that her sats were down to 83%. Her son called EMS secondary to this. She reports no change in quantity of sputum produced or frequency of coughing. She has had no fever, chills, change in dietary habits or chest pain. Past Medical History  Diagnosis Date  . Acute respiratory failure (HCC)   . Community acquired pneumonia   . COPD with acute exacerbation (HCC)   . Anemia   . Hypokalemia   . Hyponatremia   . CHF (congestive heart failure) (HCC)   . Hypertension   . Shortness of breath dyspnea   . History of kidney stones    Past Surgical History  Procedure Laterality Date  . Back surgery    . Cyst excision      base of tongue  . Cholecystectomy    . Abdominal hysterectomy     Family History  Problem Relation Age of Onset  . Diabetes Mellitus II Mother   . CAD Mother   . CAD Brother    Social History  Substance Use Topics  . Smoking status: Current Every Day Smoker -- 0.10 packs/day for 60 years    Types: Cigarettes  . Smokeless tobacco: Never Used     Comment: I smoke 1 cigarette a day "  . Alcohol Use: No   OB History    No data available     Review of Systems  All other systems reviewed and are  negative.     Allergies  Review of patient's allergies indicates no known allergies.  Home Medications   Prior to Admission medications   Medication Sig Start Date End Date Taking? Authorizing Provider  albuterol (PROVENTIL) (2.5 MG/3ML) 0.083% nebulizer solution Take 3 mLs (2.5 mg total) by nebulization every 2 (two) hours as needed for wheezing or shortness of breath. 09/07/15   Richarda Overlie, MD  apixaban (ELIQUIS) 5 MG TABS tablet Take 1 tablet (5 mg total) by mouth 2 (two) times daily. 10/04/15   Maryann Mikhail, DO  arformoterol (BROVANA) 15 MCG/2ML NEBU Take 2 mLs (15 mcg total) by nebulization 2 (two) times daily. 09/07/15   Richarda Overlie, MD  budesonide (PULMICORT) 0.25 MG/2ML nebulizer solution Take 2 mLs (0.25 mg total) by nebulization 2 (two) times daily. Patient not taking: Reported on 09/28/2015 09/07/15   Richarda Overlie, MD  diltiazem (CARDIZEM CD) 180 MG 24 hr capsule Take 1 capsule (180 mg total) by mouth daily. 09/07/15   Richarda Overlie, MD  Fluticasone-Salmeterol (ADVAIR) 500-50 MCG/DOSE AEPB Inhale 1 puff into the lungs 2 (two) times daily.    Historical Provider, MD  furosemide (LASIX) 40 MG tablet Take 1 tablet (40 mg total) by mouth daily. 10/04/15   Maryann Mikhail, DO  guaiFENesin (MUCINEX) 600 MG 12 hr tablet Take 2  tablets (1,200 mg total) by mouth 2 (two) times daily. Patient not taking: Reported on 09/28/2015 09/07/15   Richarda Overlie, MD  predniSONE (DELTASONE) 10 MG tablet Take 40mg  (4 tabs) x 3 days, then taper to 30mg  (3 tabs) x 3 days, then 20mg  (2 tabs) x 3days, then 10mg  (1 tab) x 3days, then OFF. 10/04/15   Maryann Mikhail, DO  SPIRIVA HANDIHALER 18 MCG inhalation capsule Place 18 mcg into inhaler and inhale daily.  08/24/15   Historical Provider, MD   BP 127/61 mmHg  Pulse 120  Temp(Src) 98.1 F (36.7 C) (Oral)  SpO2 94% Physical Exam  Constitutional: She is oriented to person, place, and time. She appears well-developed. She appears distressed.  Chronically  ill-appearing female who appears short of breath  HENT:  Head: Normocephalic and atraumatic.  Right Ear: External ear normal.  Left Ear: External ear normal.  Mouth/Throat: Oropharynx is clear and moist.  Eyes: Conjunctivae are normal. Pupils are equal, round, and reactive to light.  Neck: Normal range of motion. Neck supple.  Pulmonary/Chest:  Increased rest for a rate and work of breathing with increased breath sounds throughout  Abdominal: Soft. Bowel sounds are normal.  Musculoskeletal: Normal range of motion. She exhibits edema.  Neurological: She is alert and oriented to person, place, and time.  Skin: Skin is warm and dry.  Psychiatric: She has a normal mood and affect.  Nursing note and vitals reviewed.   ED Course  Procedures (including critical care time) Labs Review Labs Reviewed  CBC WITH DIFFERENTIAL/PLATELET - Abnormal; Notable for the following:    WBC 16.2 (*)    Hemoglobin 10.3 (*)    HCT 34.2 (*)    MCH 25.1 (*)    RDW 24.0 (*)    Neutro Abs 11.6 (*)    All other components within normal limits  BASIC METABOLIC PANEL - Abnormal; Notable for the following:    Chloride 93 (*)    CO2 34 (*)    Glucose, Bld 143 (*)    BUN 29 (*)    Creatinine, Ser 1.11 (*)    GFR calc non Af Amer 48 (*)    GFR calc Af Amer 55 (*)    All other components within normal limits  BRAIN NATRIURETIC PEPTIDE  CBC WITH DIFFERENTIAL/PLATELET  I-STAT TROPOININ, ED  I-STAT TROPOININ, ED  I-STAT ARTERIAL BLOOD GAS, ED    Imaging Review Dg Chest 2 View  10/20/2015  CLINICAL DATA:  Cough and congestion EXAM: CHEST  2 VIEW COMPARISON:  10/01/2015 FINDINGS: The heart size appears mildly enlarged. Aortic atherosclerosis identified. There is no pleural effusion or edema identified. The lungs are hyperinflated but clear. Coarsened interstitial markings suggestive of COPD noted. IMPRESSION: 1. Suspect emphysema 2. Mild cardiac enlargement and aortic atherosclerosis. Electronically Signed   By:  Signa Kell M.D.   On: 10/20/2015 22:54   I have personally reviewed and evaluated these images and lab results as part of my medical decision-making.   EKG Interpretation   Date/Time:  Wednesday October 20 2015 22:21:30 EST Ventricular Rate:  125 PR Interval:  144 QRS Duration: 81 QT Interval:  312 QTC Calculation: 450 R Axis:   64 Text Interpretation:  Multifocal atrial tachycardia Confirmed by Delaine Canter MD,  Duwayne Heck ([id number]) on 10/20/2015 11:40:17 PM      MDM   Final diagnoses:  COPD exacerbation (HCC)     Treated with albuterol, prednisone with some decreased wheezing.  Plan admission for further treatment.    Duwayne Heck  Marlee Armenteros, MD 10/22/15 1616

## 2015-10-21 ENCOUNTER — Encounter (HOSPITAL_COMMUNITY): Payer: Self-pay | Admitting: Family Medicine

## 2015-10-21 DIAGNOSIS — I482 Chronic atrial fibrillation: Secondary | ICD-10-CM

## 2015-10-21 DIAGNOSIS — D649 Anemia, unspecified: Secondary | ICD-10-CM | POA: Diagnosis not present

## 2015-10-21 DIAGNOSIS — R739 Hyperglycemia, unspecified: Secondary | ICD-10-CM | POA: Diagnosis not present

## 2015-10-21 DIAGNOSIS — R0600 Dyspnea, unspecified: Secondary | ICD-10-CM

## 2015-10-21 DIAGNOSIS — F17210 Nicotine dependence, cigarettes, uncomplicated: Secondary | ICD-10-CM | POA: Diagnosis present

## 2015-10-21 DIAGNOSIS — T380X5A Adverse effect of glucocorticoids and synthetic analogues, initial encounter: Secondary | ICD-10-CM | POA: Diagnosis not present

## 2015-10-21 DIAGNOSIS — Z515 Encounter for palliative care: Secondary | ICD-10-CM | POA: Diagnosis not present

## 2015-10-21 DIAGNOSIS — R05 Cough: Secondary | ICD-10-CM | POA: Diagnosis not present

## 2015-10-21 DIAGNOSIS — J441 Chronic obstructive pulmonary disease with (acute) exacerbation: Principal | ICD-10-CM

## 2015-10-21 DIAGNOSIS — Z86718 Personal history of other venous thrombosis and embolism: Secondary | ICD-10-CM | POA: Diagnosis not present

## 2015-10-21 DIAGNOSIS — J9621 Acute and chronic respiratory failure with hypoxia: Secondary | ICD-10-CM | POA: Diagnosis not present

## 2015-10-21 DIAGNOSIS — D72829 Elevated white blood cell count, unspecified: Secondary | ICD-10-CM | POA: Diagnosis not present

## 2015-10-21 DIAGNOSIS — I110 Hypertensive heart disease with heart failure: Secondary | ICD-10-CM | POA: Diagnosis not present

## 2015-10-21 DIAGNOSIS — I5032 Chronic diastolic (congestive) heart failure: Secondary | ICD-10-CM | POA: Diagnosis not present

## 2015-10-21 DIAGNOSIS — Z66 Do not resuscitate: Secondary | ICD-10-CM | POA: Diagnosis not present

## 2015-10-21 DIAGNOSIS — Z7901 Long term (current) use of anticoagulants: Secondary | ICD-10-CM | POA: Diagnosis not present

## 2015-10-21 DIAGNOSIS — Z9981 Dependence on supplemental oxygen: Secondary | ICD-10-CM | POA: Diagnosis not present

## 2015-10-21 DIAGNOSIS — Z6821 Body mass index (BMI) 21.0-21.9, adult: Secondary | ICD-10-CM | POA: Diagnosis not present

## 2015-10-21 LAB — COMPREHENSIVE METABOLIC PANEL
ALBUMIN: 2.8 g/dL — AB (ref 3.5–5.0)
ALK PHOS: 57 U/L (ref 38–126)
ALT: 15 U/L (ref 14–54)
ANION GAP: 10 (ref 5–15)
AST: 15 U/L (ref 15–41)
BUN: 25 mg/dL — ABNORMAL HIGH (ref 6–20)
CALCIUM: 8.5 mg/dL — AB (ref 8.9–10.3)
CO2: 35 mmol/L — AB (ref 22–32)
Chloride: 92 mmol/L — ABNORMAL LOW (ref 101–111)
Creatinine, Ser: 0.89 mg/dL (ref 0.44–1.00)
GFR calc non Af Amer: >60 mL/min (ref >60)
GLUCOSE: 174 mg/dL — AB (ref 65–99)
POTASSIUM: 3.3 mmol/L — AB (ref 3.5–5.1)
SODIUM: 137 mmol/L (ref 135–145)
Total Bilirubin: <0.1 mg/dL — ABNORMAL LOW (ref 0.3–1.2)
Total Protein: 5.2 g/dL — ABNORMAL LOW (ref 6.5–8.1)

## 2015-10-21 LAB — GLUCOSE, CAPILLARY
GLUCOSE-CAPILLARY: 210 mg/dL — AB (ref 65–99)
GLUCOSE-CAPILLARY: 319 mg/dL — AB (ref 65–99)
GLUCOSE-CAPILLARY: 217 mg/dL — AB (ref 65–99)
GLUCOSE-CAPILLARY: 224 mg/dL — AB (ref 65–99)

## 2015-10-21 LAB — CBC WITH DIFFERENTIAL/PLATELET
BASOS ABS: 0 10*3/uL (ref 0.0–0.1)
BASOS PCT: 0 %
EOS ABS: 0 10*3/uL (ref 0.0–0.7)
Eosinophils Relative: 0 %
HCT: 29.9 % — ABNORMAL LOW (ref 36.0–46.0)
Hemoglobin: 9.2 g/dL — ABNORMAL LOW (ref 12.0–15.0)
LYMPHS ABS: 1.1 10*3/uL (ref 0.7–4.0)
LYMPHS PCT: 8 %
MCH: 25.4 pg — AB (ref 26.0–34.0)
MCHC: 30.8 g/dL (ref 30.0–36.0)
MCV: 82.6 fL (ref 78.0–100.0)
MONO ABS: 0.4 10*3/uL (ref 0.1–1.0)
Monocytes Relative: 3 %
NEUTROS ABS: 12.2 10*3/uL — AB (ref 1.7–7.7)
Neutrophils Relative %: 89 %
PLATELETS: 231 10*3/uL (ref 150–400)
RBC: 3.62 MIL/uL — ABNORMAL LOW (ref 3.87–5.11)
RDW: 23.8 % — AB (ref 11.5–15.5)
WBC: 13.7 10*3/uL — ABNORMAL HIGH (ref 4.0–10.5)

## 2015-10-21 LAB — MRSA PCR SCREENING: MRSA by PCR: NEGATIVE

## 2015-10-21 MED ORDER — LEVOFLOXACIN IN D5W 500 MG/100ML IV SOLN
500.0000 mg | INTRAVENOUS | Status: DC
  Administered 2015-10-21 – 2015-10-23 (×3): 500 mg via INTRAVENOUS
  Filled 2015-10-21 (×3): qty 100

## 2015-10-21 MED ORDER — BISACODYL 5 MG PO TBEC: 5.0000 mg | DELAYED_RELEASE_TABLET | Freq: Every day | ORAL | Status: DC | PRN

## 2015-10-21 MED ORDER — IPRATROPIUM-ALBUTEROL 0.5-2.5 (3) MG/3ML IN SOLN
3.0000 mL | Freq: Four times a day (QID) | RESPIRATORY_TRACT | Status: DC
  Administered 2015-10-21 – 2015-10-22 (×6): 3 mL via RESPIRATORY_TRACT
  Filled 2015-10-21 (×6): qty 3

## 2015-10-21 MED ORDER — INSULIN ASPART 100 UNIT/ML ~~LOC~~ SOLN
3.0000 [IU] | Freq: Three times a day (TID) | SUBCUTANEOUS | Status: DC
  Administered 2015-10-21 – 2015-10-23 (×6): 3 [IU] via SUBCUTANEOUS

## 2015-10-21 MED ORDER — IPRATROPIUM-ALBUTEROL 0.5-2.5 (3) MG/3ML IN SOLN
3.0000 mL | RESPIRATORY_TRACT | Status: DC | PRN
  Administered 2015-10-23: 3 mL via RESPIRATORY_TRACT
  Filled 2015-10-21 (×2): qty 3

## 2015-10-21 MED ORDER — SODIUM CHLORIDE 0.9% FLUSH: 3.0000 mL | Freq: Two times a day (BID) | INTRAVENOUS | Status: DC

## 2015-10-21 MED ORDER — GLUCERNA SHAKE PO LIQD
237.0000 mL | Freq: Three times a day (TID) | ORAL | Status: DC
  Administered 2015-10-21 – 2015-10-23 (×6): 237 mL via ORAL

## 2015-10-21 MED ORDER — POTASSIUM CHLORIDE CRYS ER 20 MEQ PO TBCR
40.0000 meq | EXTENDED_RELEASE_TABLET | Freq: Once | ORAL | Status: AC
  Administered 2015-10-21: 40 meq via ORAL
  Filled 2015-10-21: qty 2

## 2015-10-21 MED ORDER — GUAIFENESIN ER 600 MG PO TB12
600.0000 mg | ORAL_TABLET | Freq: Two times a day (BID) | ORAL | Status: DC
  Administered 2015-10-21 – 2015-10-23 (×5): 600 mg via ORAL
  Filled 2015-10-21 (×5): qty 1

## 2015-10-21 MED ORDER — SENNOSIDES-DOCUSATE SODIUM 8.6-50 MG PO TABS: 1.0000 | ORAL_TABLET | Freq: Every evening | ORAL | Status: DC | PRN

## 2015-10-21 MED ORDER — INSULIN ASPART 100 UNIT/ML ~~LOC~~ SOLN
0.0000 [IU] | Freq: Every day | SUBCUTANEOUS | Status: DC
  Administered 2015-10-21 – 2015-10-22 (×2): 2 [IU] via SUBCUTANEOUS

## 2015-10-21 MED ORDER — SODIUM CHLORIDE 0.9% FLUSH
3.0000 mL | Freq: Two times a day (BID) | INTRAVENOUS | Status: DC
  Administered 2015-10-21 – 2015-10-23 (×6): 3 mL via INTRAVENOUS

## 2015-10-21 MED ORDER — DILTIAZEM HCL ER COATED BEADS 180 MG PO CP24
180.0000 mg | ORAL_CAPSULE | Freq: Every day | ORAL | Status: DC
  Administered 2015-10-21 – 2015-10-23 (×3): 180 mg via ORAL
  Filled 2015-10-21 (×3): qty 1

## 2015-10-21 MED ORDER — ONDANSETRON HCL 4 MG/2ML IJ SOLN: 4.0000 mg | Freq: Four times a day (QID) | INTRAMUSCULAR | Status: DC | PRN

## 2015-10-21 MED ORDER — HYDROCODONE-ACETAMINOPHEN 5-325 MG PO TABS: 1.0000 | ORAL_TABLET | ORAL | Status: DC | PRN

## 2015-10-21 MED ORDER — METHYLPREDNISOLONE SODIUM SUCC 125 MG IJ SOLR
60.0000 mg | Freq: Four times a day (QID) | INTRAMUSCULAR | Status: DC
  Administered 2015-10-21 – 2015-10-22 (×6): 60 mg via INTRAVENOUS
  Filled 2015-10-21 (×6): qty 2

## 2015-10-21 MED ORDER — INSULIN ASPART 100 UNIT/ML ~~LOC~~ SOLN
0.0000 [IU] | Freq: Three times a day (TID) | SUBCUTANEOUS | Status: DC
  Administered 2015-10-21: 5 [IU] via SUBCUTANEOUS
  Administered 2015-10-22 (×3): 3 [IU] via SUBCUTANEOUS
  Administered 2015-10-23: 5 [IU] via SUBCUTANEOUS
  Administered 2015-10-23: 3 [IU] via SUBCUTANEOUS

## 2015-10-21 MED ORDER — METHYLPREDNISOLONE SODIUM SUCC 125 MG IJ SOLR
125.0000 mg | Freq: Once | INTRAMUSCULAR | Status: AC
Start: 2015-10-21 — End: 2015-10-21
  Administered 2015-10-21: 125 mg via INTRAVENOUS
  Filled 2015-10-21: qty 2

## 2015-10-21 MED ORDER — ACETAMINOPHEN 650 MG RE SUPP: 650.0000 mg | Freq: Four times a day (QID) | RECTAL | Status: DC | PRN

## 2015-10-21 MED ORDER — SODIUM CHLORIDE 0.9 % IV SOLN: 250.0000 mL | INTRAVENOUS | Status: DC | PRN

## 2015-10-21 MED ORDER — INSULIN ASPART 100 UNIT/ML ~~LOC~~ SOLN
0.0000 [IU] | Freq: Three times a day (TID) | SUBCUTANEOUS | Status: DC
  Administered 2015-10-21: 7 [IU] via SUBCUTANEOUS
  Administered 2015-10-21: 3 [IU] via SUBCUTANEOUS

## 2015-10-21 MED ORDER — OXYCODONE HCL 5 MG PO TABS: 5.0000 mg | ORAL_TABLET | ORAL | Status: DC | PRN

## 2015-10-21 MED ORDER — ONDANSETRON HCL 4 MG PO TABS: 4.0000 mg | ORAL_TABLET | Freq: Four times a day (QID) | ORAL | Status: DC | PRN

## 2015-10-21 MED ORDER — MOMETASONE FURO-FORMOTEROL FUM 200-5 MCG/ACT IN AERO
2.0000 | INHALATION_SPRAY | Freq: Two times a day (BID) | RESPIRATORY_TRACT | Status: DC
  Administered 2015-10-21 – 2015-10-23 (×5): 2 via RESPIRATORY_TRACT
  Filled 2015-10-21: qty 8.8

## 2015-10-21 MED ORDER — APIXABAN 5 MG PO TABS
5.0000 mg | ORAL_TABLET | Freq: Two times a day (BID) | ORAL | Status: DC
  Administered 2015-10-21 – 2015-10-23 (×6): 5 mg via ORAL
  Filled 2015-10-21 (×6): qty 1

## 2015-10-21 MED ORDER — ACETAMINOPHEN 325 MG PO TABS: 650.0000 mg | ORAL_TABLET | Freq: Four times a day (QID) | ORAL | Status: DC | PRN

## 2015-10-21 MED ORDER — SODIUM CHLORIDE 0.9% FLUSH: 3.0000 mL | INTRAVENOUS | Status: DC | PRN

## 2015-10-21 MED ORDER — ARFORMOTEROL TARTRATE 15 MCG/2ML IN NEBU
15.0000 ug | INHALATION_SOLUTION | Freq: Two times a day (BID) | RESPIRATORY_TRACT | Status: DC
Start: 2015-10-21 — End: 2015-10-23
  Administered 2015-10-21 – 2015-10-23 (×4): 15 ug via RESPIRATORY_TRACT
  Filled 2015-10-21 (×4): qty 2

## 2015-10-21 MED ORDER — HYDROMORPHONE HCL 1 MG/ML IJ SOLN: 0.5000 mg | INTRAMUSCULAR | Status: DC | PRN

## 2015-10-21 MED ORDER — FUROSEMIDE 40 MG PO TABS
40.0000 mg | ORAL_TABLET | Freq: Every day | ORAL | Status: DC
  Administered 2015-10-21 – 2015-10-23 (×3): 40 mg via ORAL
  Filled 2015-10-21 (×3): qty 1

## 2015-10-21 NOTE — Progress Notes (Signed)
[facility]                                                                              Patient Demographics  Brenda Wells, is a 75 y.o. female, DOB - 1940-10-09, ZOX:[id number]  Admit date - 10/20/2015   Admitting Physician Briscoe Deutscher, MD  Outpatient Primary MD for the patient is [name],[name], MD  LOS - 0   Chief Complaint  Patient presents with  . Respiratory Distress       Brief HPI   Per Dr. Antionette Char on 2/2 Brenda Wells is a 75 y.o. female with PMH of COPD with 4 Lpm oxygen requirement, chronic atrial fibrillation on Eliquis, and chronic diastolic CHF presented to the ED with progressive dyspnea and increased nonproductive cough. This is Ms. Goodgame fifth admission in 3 months, last discharged 10/04/2015 following treatment of COPD exacerbation. She described making a full recovery back to her baseline since the recent discharge, but during the afternoon of her presentation, she developed worsening of her chronic nonproductive cough and worsening dyspnea. She gave herself 2 nebulized breathing treatments at home but remained dyspneic at rest. She checked her home pulse oximeter and found her set to be 83%. She called her son for advice and he activated EMS. Patient denied any recent fevers, chills, chest pain, or palpitations. There is no hemoptysis or leg pain. EMS found the patient to be saturating in the low 80s on 4 L/m.    In ED, patient was found to be afebrile, saturating presently on 4 L, with tachypnea and tachycardia. EKG demonstrated an atrial tachyarrhythmia and chest x-ray was negative for any acute process. Blood work returned with a negative troponin, low BNP, leukocytosis to 16,000, stable normocytic anemia, small bump in serum creatinine to 1.1, and hyperglycemia.   Assessment & Plan    Principal Problem: COPD exacerbation with acute on chronic hypoxic respiratory failure  -  This is 5th admit since November '16  - Continue duo nebs, IV  Solu-Medrol, Levaquin.   - Continue Symbicort, Gonzella Lex  - Palliative consult requested for symptom management, Goals of care and patient may benefit from hospice at home   Chronic atrial fibrillation  - Appears to be in MAT on admission EKG  - Continue Eliquis and Cardizem - CHADS vasc 4    Chronic diastolic CHF  - TTE (07/22/15) EF 50-55%  - BNP 60 on admission and no edema on CXR  - Appeared to be clinically dry, hold home dose of Lasix, follow I's and O's and daily weights.    Normocytic anemia  - Hgb 10.3 on arrival, stable from prior measurements  - No sign of active blood loss    Hyperglycemia  - Likely steroid-induced as was on prednisone outpt  - Continuing systemic steroids with Solu-Medrol - Placed on moderate sliding scale, meal coverage   Leukocytosis  - WBC 16,200 on admission  - Suspected secondary to steroid-induced demargination  - No fever or infectious sxs - Treating with Levaquin as above   Code Status: dnr  Family Communication: Discussed in detail with the patient, all imaging results, lab results explained to  the patient    Disposition Plan:   Time Spent in minutes  25 minutes  Procedures  None   Consults   None   DVT Prophylaxis  eliquis  Medications  Scheduled Meds: . apixaban  5 mg Oral BID  . arformoterol  15 mcg Nebulization BID  . diltiazem  180 mg Oral Daily  . feeding supplement (GLUCERNA SHAKE)  237 mL Oral TID BM  . furosemide  40 mg Oral Daily  . insulin aspart  0-9 Units Subcutaneous TID WC  . ipratropium-albuterol  3 mL Nebulization Q6H  . levofloxacin (LEVAQUIN) IV  500 mg Intravenous Q24H  . methylPREDNISolone (SOLU-MEDROL) injection  60 mg Intravenous Q6H  . mometasone-formoterol  2 puff Inhalation BID  . sodium chloride flush  3 mL Intravenous Q12H   Continuous Infusions:  PRN Meds:.acetaminophen **OR** acetaminophen, bisacodyl, HYDROcodone-acetaminophen, HYDROmorphone (DILAUDID) injection,  ipratropium-albuterol, ondansetron **OR** ondansetron (ZOFRAN) IV, senna-docusate   Antibiotics   Anti-infectives    Start     Dose/Rate Route Frequency Ordered Stop   10/21/15 0200  levofloxacin (LEVAQUIN) IVPB 500 mg     500 mg 100 mL/hr over 60 Minutes Intravenous Every 24 hours 10/21/15 0030     10/21/15 0000  levofloxacin (LEVAQUIN) IVPB 500 mg  Status:  Discontinued     500 mg 100 mL/hr over 60 Minutes Intravenous  Once 10/20/15 2347 10/21/15 0030        Subjective:   Brenda Wells was seen and examined today.  Receiving breathing treatment at the time of my encounter, feeling overall improving. No fevers or chills. Patient denies dizziness, chest pain, abdominal pain, N/V/D/C, new weakness, numbess, tingling. No acute events overnight.    Objective:   Blood pressure 126/67, pulse 110, temperature 97.4 F (36.3 C), temperature source Oral, resp. rate 18, height  (1.651 m), weight 58.469 kg (128 lb 14.4 oz), SpO2 96 %.  Wt Readings from Last 3 Encounters:  10/21/15 58.469 kg (128 lb 14.4 oz)  10/04/15 61.961 kg (136 lb 9.6 oz)  09/12/15 65.2 kg (143 lb 11.8 oz)     Intake/Output Summary (Last 24 hours) at 10/21/15 1401 Last data filed at 10/21/15 1332  Gross per 24 hour  Intake    600 ml  Output   1150 ml  Net   -550 ml    Exam  General: Alert and oriented x 3, NAD  HEENT:  PERRLA, EOMI, Anicteric Sclera, mucous membranes moist.   Neck: Supple, no JVD, no masses  CVS: S1 S2 auscultated, no rubs, murmurs or gallops. Regular rate and rhythm.  Respiratory: Bilateral expiratory wheezing  Abdomen : Soft, nontender, nondistended, + bowel sounds  Ext: no cyanosis clubbing or edema  Neuro: AAOx3, Cr N's II- XII. Strength 5/5 upper and lower extremities bilaterally  Skin: No rashes  Psych: Normal affect and demeanor, alert and oriented x3    Data Review   Micro Results Recent Results (from the past 240 hour(s))  MRSA PCR Screening     Status: None     Collection Time: 10/21/15  1:26 AM  Result Value Ref Range Status   MRSA by PCR NEGATIVE NEGATIVE Final    Comment:        The GeneXpert MRSA Assay (FDA approved for NASAL specimens only), is one component of a comprehensive MRSA colonization surveillance program. It is not intended to diagnose MRSA infection nor to guide or monitor treatment for MRSA infections.     Radiology Reports Dg Chest 2 View  10/20/2015  CLINICAL DATA:  Cough and congestion EXAM: CHEST  2 VIEW COMPARISON:  10/01/2015 FINDINGS: The heart size appears mildly enlarged. Aortic atherosclerosis identified. There is no pleural effusion or edema identified. The lungs are hyperinflated but clear. Coarsened interstitial markings suggestive of COPD noted. IMPRESSION: 1. Suspect emphysema 2. Mild cardiac enlargement and aortic atherosclerosis. Electronically Signed   By: Signa Kell M.D.   On: 10/20/2015 22:54   Dg Chest 2 View  09/28/2015  CLINICAL DATA:  Shortness of breath, weakness and history of COPD. EXAM: CHEST - 2 VIEW COMPARISON:  09/13/2015 FINDINGS: Stable advanced emphysematous lung disease with moderate bilateral hyperinflation. There is no evidence of pulmonary edema, consolidation, pneumothorax or nodule. Tiny left pleural effusion present. Stable top-normal heart size. IMPRESSION: Stable advanced emphysema with hyperinflation. Small left pleural effusion without overt pulmonary edema or focal infiltrate. Electronically Signed   By: Irish Lack M.D.   On: 09/28/2015 20:39   Ct Angio Chest Pe W/cm &/or Wo Cm  09/29/2015  CLINICAL DATA:  Patient with a DVT in the right lower extremity. History of COPD. Worsening shortness of breath. Initial encounter. EXAM: CT ANGIOGRAPHY CHEST WITH CONTRAST TECHNIQUE: Multidetector CT imaging of the chest was performed using the standard protocol during bolus administration of intravenous contrast. Multiplanar CT image reconstructions and MIPs were obtained to evaluate the  vascular anatomy. CONTRAST:  80 mL OMNIPAQUE IOHEXOL 350 MG/ML SOLN COMPARISON:  CT chest 07/22/2015.  PA and lateral chest 09/28/2015. FINDINGS: No pulmonary embolus is identified. Extensive aortic and coronary atherosclerosis is seen. No pathologic lymphadenopathy by CT size criteria in the axilla, hila or mediastinum is identified. Very small bilateral pleural effusions are seen and decreased since the prior CT. Heart size is mildly enlarged. The lungs demonstrate extensive emphysematous change. Chronic atelectasis in the left lower lobe is unchanged. Volume loss in the right middle lobe seen on the prior CT has resolved. The lungs are otherwise clear. Visualized upper abdomen shows an unchanged low-attenuation lesion in the caudate lobe of the liver most consistent with a cyst. No focal bony abnormality is identified. Review of the MIP images confirms the above findings. IMPRESSION: Negative for pulmonary embolus or acute disease. Trace bilateral pleural effusions are decreased since the prior CT. Emphysema. Mild cardiomegaly. Calcific aortic and coronary atherosclerosis. Electronically Signed   By: Drusilla Kanner M.D.   On: 09/29/2015 09:01   Dg Chest Port 1 View  10/01/2015  CLINICAL DATA:  Worsening shortness of breath EXAM: PORTABLE CHEST 1 VIEW COMPARISON:  09/28/2015 FINDINGS: Chronic cardiopericardial enlargement. Stable aortic contours. There is a small left pleural effusion. Interstitial coarsening without edema. No consolidation or pneumothorax. IMPRESSION: 1. Pulmonary venous congestion and small left effusion. 2. COPD. Electronically Signed   By: Marnee Spring M.D.   On: 10/01/2015 13:17    CBC  Recent Labs Lab 10/20/15 2215 10/21/15 0417  WBC 16.2* 13.7*  HGB 10.3* 9.2*  HCT 34.2* 29.9*  PLT 256 231  MCV 83.4 82.6  MCH 25.1* 25.4*  MCHC 30.1 30.8  RDW 24.0* 23.8*  LYMPHSABS 3.6 1.1  MONOABS 1.0 0.4  EOSABS 0.0 0.0  BASOSABS 0.0 0.0    Chemistries   Recent Labs Lab  10/20/15 2215 10/21/15 0417  NA 141 137  K 3.5 3.3*  CL 93* 92*  CO2 34* 35*  GLUCOSE 143* 174*  BUN 29* 25*  CREATININE 1.11* 0.89  CALCIUM 9.0 8.5*  AST  --  15  ALT  --  15  ALKPHOS  --  57  BILITOT  --  <0.1*   ------------------------------------------------------------------------------------------------------------------ estimated creatinine clearance is 49.9 mL/min (by C-G formula based on Cr of 0.89). ------------------------------------------------------------------------------------------------------------------ No results for input(s): HGBA1C in the last 72 hours. ------------------------------------------------------------------------------------------------------------------ No results for input(s): CHOL, HDL, LDLCALC, TRIG, CHOLHDL, LDLDIRECT in the last 72 hours. ------------------------------------------------------------------------------------------------------------------ No results for input(s): TSH, T4TOTAL, T3FREE, THYROIDAB in the last 72 hours.  Invalid input(s): FREET3 ------------------------------------------------------------------------------------------------------------------ No results for input(s): VITAMINB12, FOLATE, FERRITIN, TIBC, IRON, RETICCTPCT in the last 72 hours.  Coagulation profile No results for input(s): INR, PROTIME in the last 168 hours.  No results for input(s): DDIMER in the last 72 hours.  Cardiac Enzymes No results for input(s): CKMB, TROPONINI, MYOGLOBIN in the last 168 hours.  Invalid input(s): CK ------------------------------------------------------------------------------------------------------------------ Invalid input(s): POCBNP   Recent Labs  10/21/15 0717 10/21/15 1115  GLUCAP 210* 319*     RAI,RIPUDEEP M.D. [facility] 10/21/2015, 2:01 PM  Pager: [phone] Between 7am to 7pm - call Pager - [phone]  After 7pm go to [email or web address] - password [email or web address]  Call night coverage person covering after  7pm

## 2015-10-21 NOTE — Progress Notes (Signed)
Pt admitted from ED with COPD exacerbation a/o, no c/o pain, pt SOB at rest O2 4L dependant at home, ST with exertion into 150's runs 110's at rest, VSS, pt stable

## 2015-10-21 NOTE — Care Management Note (Signed)
Case Management Note  Patient Details  Name: Brenda Wells MRN: [id number] Date of Birth: 1941-01-02  Subjective/Objective:                    Action/Plan: Patient was admitted with Dyspnea, increased cough. Lives at home with children. Patient was active with [facility] for HHPT/RN.  Will follow for discharge needs pending PT/OT evals and physician orders.  Expected Discharge Date:                  Expected Discharge Plan:     In-House Referral:     Discharge planning Services     Post Acute Care Choice:    Choice offered to:     DME Arranged:    DME Agency:     HH Arranged:    HH Agency:     Status of Service:  In process, will continue to follow  Medicare Important Message Given:    Date Medicare IM Given:    Medicare IM give by:    Date Additional Medicare IM Given:    Additional Medicare Important Message give by:     If discussed at Long Length of Stay Meetings, dates discussed:    Additional CommentsAnda Kraft, RN 10/21/2015, 4:04 PM [phone]

## 2015-10-21 NOTE — ED Notes (Signed)
Pt from home where she presents to ED for SOB.  Patient states it has been worsening for 2 days.  Pt sts she did smoke a cigarette today and yesterday.  Pt was 85% on O2 when fire arrived.  Pt placed on NRB.  When EMS arrived they put her on her home 4L and patient was 98%.  Pt denies any pain.

## 2015-10-21 NOTE — Consult Note (Signed)
Consultation Note Date: 10/21/2015   Patient Name: Brenda Wells  DOB: 05-10-1941  MRN: [id number]  Age / Sex: 75 y.o., female  PCP: Maryella Shivers, MD Referring Physician: Mendel Corning, MD  Reason for Consultation: Establishing goals of care   Clinical Assessment/Narrative: I met today with Brenda Wells. She has met with palliative care before on past admission. She seems to be in good spirits. She tells me about living with her son and his girlfriend - they are always helping her and there 24/7 to assist - she says they "help too much." This bothers her as she very much wants to remain as independent as possible. She believes that she overexerted herself yesterday and this is why she is readmitted. She knows that this is only going to get worse and that she will die from this. She jokingly tells me that she told her daughter "I'm not ready to die yet, not until I'm 75 yo... I turned 75 on Monday." She expressed desire not to come back to the hospital. We discussed hospice and how they could help with her goals to keep her at home and SOB managed. We discussed Oxy IR she used last admission and she said this helped but then she never received prescription upon discharge for this. Encouraged her to consider hospice care. She is not convinced that hospice can help her d/t experience from a friend of hers.   Contacts/Participants in Discussion: Primary Decision Maker: Self   Relationship to Patient Next is son Glendell Docker HCPOA: no  No formal HCPOA  SUMMARY OF RECOMMENDATIONS - Oxy IR 5 mg every 4 hours prn SOB.  - Her main goal is staying as independent and functional as possible.  - Also interested in trying to stay out of the hospital.  - Encouraged her to consider hospice.   Code Status/Advance Care Planning: DNR    Code Status Orders        Start     Ordered   10/21/15 0025  Do not attempt resuscitation (DNR)    Continuous    Question Answer Comment  In the event of cardiac or respiratory ARREST Do not call a "code blue"   In the event of cardiac or respiratory ARREST Do not perform Intubation, CPR, defibrillation or ACLS   In the event of cardiac or respiratory ARREST Use medication by any route, position, wound care, and other measures to relive pain and suffering. May use oxygen, suction and manual treatment of airway obstruction as needed for comfort.      10/21/15 0030    Code Status History    Date Active Date Inactive Code Status Order ID Comments User Context   09/29/2015  4:20 AM 10/04/2015  5:43 PM DNR [id number]  Rise Patience, MD ED   09/09/2015  8:55 PM 09/14/2015  4:48 PM DNR [id number]  Samella Parr, NP Inpatient   09/02/2015 10:17 AM 09/07/2015  6:36 PM DNR [id number]  Reyne Dumas, MD Inpatient   09/02/2015  3:10 AM 09/02/2015 10:17 AM Partial Code [id number]  Ivor Costa, MD ED   07/22/2015 12:30 AM 07/27/2015  6:42 PM DNR [id number]  Rise Patience, MD Inpatient        Symptom Management:   SOB/dysnea: Oxy IR 5 mg every 4 hours prn.   Cough: Guaifenesin BID.   Palliative Prophylaxis:   Delirium Protocol and Frequent Pain Assessment  Additional Recommendations (Limitations, Scope, Preferences):  Avoid Hospitalization  Psycho-social/Spiritual:  Support System: Strong Desire for further  Chaplaincy support:no Additional Recommendations: Caregiving  Support/Resources and Education on Hospice  Prognosis: < 6 months  Discharge Planning: Home - I recommended to her hospice at home but not sure she will agree with this.    Chief Complaint/ Primary Diagnoses: Present on Admission:  . COPD exacerbation ([place]) . Chronic diastolic (congestive) heart failure ([place]) . Chronic atrial fibrillation ([place]) . Acute on chronic respiratory failure with hypoxia ([place]) . Normocytic anemia . Leukocytosis  I have reviewed the medical record, interviewed the patient and family,  and examined the patient. The following aspects are pertinent.  Past Medical History  Diagnosis Date  . Acute respiratory failure ([place])   . Community acquired pneumonia   . COPD with acute exacerbation ([place])   . Anemia   . Hypokalemia   . Hyponatremia   . CHF (congestive heart failure) ([place])   . Hypertension   . Shortness of breath dyspnea   . History of kidney stones    Social History   Social History  . Marital Status: Single    Spouse Name: N/A  . Number of Children: N/A  . Years of Education: N/A   Social History Main Topics  . Smoking status: Current Every Day Smoker -- 0.10 packs/day for 60 years    Types: Cigarettes  . Smokeless tobacco: Never Used     Comment: I smoke 1 cigarette a day "  . Alcohol Use: No  . Drug Use: No  . Sexual Activity: Not Asked   Other Topics Concern  . None   Social History Narrative   Family History  Problem Relation Age of Onset  . Diabetes Mellitus II Mother   . CAD Mother   . CAD Brother    Scheduled Meds: . apixaban  5 mg Oral BID  . arformoterol  15 mcg Nebulization BID  . diltiazem  180 mg Oral Daily  . feeding supplement (GLUCERNA SHAKE)  237 mL Oral TID BM  . furosemide  40 mg Oral Daily  . insulin aspart  0-15 Units Subcutaneous TID WC  . insulin aspart  0-5 Units Subcutaneous QHS  . insulin aspart  3 Units Subcutaneous TID WC  . ipratropium-albuterol  3 mL Nebulization Q6H  . levofloxacin (LEVAQUIN) IV  500 mg Intravenous Q24H  . methylPREDNISolone (SOLU-MEDROL) injection  60 mg Intravenous Q6H  . mometasone-formoterol  2 puff Inhalation BID  . sodium chloride flush  3 mL Intravenous Q12H   Continuous Infusions:  PRN Meds:.acetaminophen **OR** acetaminophen, bisacodyl, HYDROcodone-acetaminophen, HYDROmorphone (DILAUDID) injection, ipratropium-albuterol, ondansetron **OR** ondansetron (ZOFRAN) IV, senna-docusate Medications Prior to Admission:  Prior to Admission medications   Medication Sig Start Date End Date  Taking? Authorizing Provider  albuterol (PROVENTIL) (2.5 MG/3ML) 0.083% nebulizer solution Take 3 mLs (2.5 mg total) by nebulization every 2 (two) hours as needed for wheezing or shortness of breath. 09/07/15  Yes Reyne Dumas, MD  arformoterol (BROVANA) 15 MCG/2ML NEBU Take 2 mLs (15 mcg total) by nebulization 2 (two) times daily. 09/07/15  Yes Reyne Dumas, MD  diltiazem (CARDIZEM CD) 180 MG 24 hr capsule Take 1 capsule (180 mg total) by mouth daily. 09/07/15  Yes Reyne Dumas, MD  Fluticasone-Salmeterol (ADVAIR) 500-50 MCG/DOSE AEPB Inhale 1 puff into the lungs 2 (two) times daily.   Yes Historical Provider, MD  furosemide (LASIX) 40 MG tablet Take 1 tablet (40 mg total) by mouth daily. 10/04/15  Yes Maryann Mikhail, DO  predniSONE (DELTASONE) 10 MG tablet Take 91m (4 tabs) x 3 days, then taper to  75m (3 tabs) x 3 days, then 255m(2 tabs) x 3days, then 1077m1 tab) x 3days, then OFF. 10/04/15  Yes Maryann Mikhail, DO  SPIRIVA HANDIHALER 18 MCG inhalation capsule Place 18 mcg into inhaler and inhale daily.  08/24/15  Yes Historical Provider, MD  apixaban (ELIQUIS) 5 MG TABS tablet Take 1 tablet (5 mg total) by mouth 2 (two) times daily. Patient not taking: Reported on 10/20/2015 10/04/15   MarVelta Addisonkhail, DO   No Known Allergies  Review of Systems  Constitutional: Positive for activity change and fatigue. Negative for appetite change.  Respiratory: Positive for cough, shortness of breath and wheezing.   Neurological: Positive for weakness.    Physical Exam  Constitutional: She is oriented to person, place, and time. She appears well-developed and well-nourished.  HENT:  Head: Normocephalic and atraumatic.  Cardiovascular: Normal rate.   Respiratory: She has wheezes.  Intermittently labored breathing  GI: Normal appearance.  Neurological: She is alert and oriented to person, place, and time.    Vital Signs: BP 126/67 mmHg  Pulse 110  Temp(Src) 97.4 F (36.3 C) (Oral)  Resp 18  Ht 5'  5" (1.651 m)  Wt 58.469 kg (128 lb 14.4 oz)  BMI 21.45 kg/m2  SpO2 96%  SpO2: SpO2: 96 % O2 Device:SpO2: 96 % O2 Flow Rate: .O2 Flow Rate (L/min): 4 L/min  IO: Intake/output summary:  Intake/Output Summary (Last 24 hours) at 10/21/15 1453 Last data filed at 10/21/15 1332  Gross per 24 hour  Intake    600 ml  Output   1150 ml  Net   -550 ml    LBM:   Baseline Weight: Weight: 58.469 kg (128 lb 14.4 oz) Most recent weight: Weight: 58.469 kg (128 lb 14.4 oz)      Palliative Assessment/Data:  Flowsheet Rows        Most Recent Value   Intake Tab    Referral Department  Hospitalist   Unit at Time of Referral  Cardiac/Telemetry Unit   Palliative Care Primary Diagnosis  Pulmonary   Date Notified  10/21/15   Palliative Care Type  Return patient Palliative Care   Reason for referral  Clarify Goals of Care   Date of Admission  10/20/15   # of days IP prior to Palliative referral  1   Clinical Assessment    Psychosocial & Spiritual Assessment    Palliative Care Outcomes       Additional Data Reviewed:  CBC:    Component Value Date/Time   WBC 13.7* 10/21/2015 0417   HGB 9.2* 10/21/2015 0417   HCT 29.9* 10/21/2015 0417   PLT 231 10/21/2015 0417   MCV 82.6 10/21/2015 0417   NEUTROABS 12.2* 10/21/2015 0417   LYMPHSABS 1.1 10/21/2015 0417   MONOABS 0.4 10/21/2015 0417   EOSABS 0.0 10/21/2015 0417   BASOSABS 0.0 10/21/2015 0417   Comprehensive Metabolic Panel:    Component Value Date/Time   NA 137 10/21/2015 0417   K 3.3* 10/21/2015 0417   CL 92* 10/21/2015 0417   CO2 35* 10/21/2015 0417   BUN 25* 10/21/2015 0417   CREATININE 0.89 10/21/2015 0417   GLUCOSE 174* 10/21/2015 0417   CALCIUM 8.5* 10/21/2015 0417   AST 15 10/21/2015 0417   ALT 15 10/21/2015 0417   ALKPHOS 57 10/21/2015 0417   BILITOT <0.1* 10/21/2015 0417   PROT 5.2* 10/21/2015 0417   ALBUMIN 2.8* 10/21/2015 0417     Time In: 0959937me Out: 1110 Time Total: 33m26mreater than  50%  of this time  was spent counseling and coordinating care related to the above assessment and plan.  Signed by: Pershing Proud, NP  Pershing Proud, NP  12/19/3951, 2:53 PM  Please contact Palliative Medicine Team phone at [phone] for questions and concerns.

## 2015-10-21 NOTE — Progress Notes (Signed)
pts K+ 3.3, MD notified and ordered 40 mEq po potassium

## 2015-10-21 NOTE — Evaluation (Signed)
Physical Therapy Evaluation Patient Details Name: Brenda Wells MRN: [id number] DOB: March 08, 1941 Today's Date: 10/21/2015   History of Present Illness  75 y.o. female with PMH of COPD with 4 Lpm oxygen requirement, chronic atrial fibrillation on Eliquis, and chronic diastolic CHF who presents to the ED with progressive dyspnea and increased nonproductive cough  Clinical Impression  Patient demonstrates deficits in functional mobility as indicated below. Will need continued skilled PT to address deficits and maximize function. Will see as indicated and progress as tolerated. OF NOTE: Saturations >92% on 4 liters [place], cues for nasal inhalation. HR elevated with activity to upper 130s.     Follow Up Recommendations Home health PT;Supervision for mobility/OOB    Equipment Recommendations  None recommended by PT    Recommendations for Other Services OT consult     Precautions / Restrictions Precautions Precautions: Fall Precaution Comments: watch HR and sats Restrictions Weight Bearing Restrictions: No      Mobility  Bed Mobility Overal bed mobility: Needs Assistance Bed Mobility: Supine to Sit     Supine to sit: Modified independent (Device/Increase time)     General bed mobility comments: Increased time and use of bed rail  Transfers Overall transfer level: Modified independent Equipment used: None   Sit to Stand: Modified independent (Device/Increase time) Stand pivot transfers: Modified independent (Device/Increase time)          Ambulation/Gait Ambulation/Gait assistance: Modified independent (Device/Increase time) Ambulation Distance (Feet): 180 Feet Assistive device: Rolling walker (2 wheeled) Gait Pattern/deviations: Step-through pattern;Decreased stride length Gait velocity: decreased Gait velocity interpretation: Below normal speed for age/gender General Gait Details: steady with use of RW, some instability and increased lateral sway without device. DOE with  increased distance on 4 liters  Stairs Stairs: Yes Stairs assistance: Min assist Stair Management: One rail Right;Forwards;Step to pattern Number of Stairs: 3 General stair comments: increased effort, poor ability to power up during ascent. Assist for stability and technique  Wheelchair Mobility    Modified Rankin (Stroke Patients Only)       Balance Overall balance assessment: History of Falls         Standing balance support: During functional activity Standing balance-Leahy Scale: Fair                               Pertinent Vitals/Pain Pain Assessment: No/denies pain    Home Living Family/patient expects to be discharged to:: Private residence Living Arrangements: Children Available Help at Discharge: Family;Available 24 hours/day (son and son's girlfriend) Type of Home: House Home Access: Stairs to enter Entrance Stairs-Rails: Lawyer of Steps: 5 Home Layout: One level Home Equipment: Environmental consultant - 2 wheels;Bedside commode;Wheelchair - [employer] - standard      Prior Function Level of Independence: Needs assistance   Gait / Transfers Assistance Needed: Ind w/ ambulating short distance from bed to WC w/o AD.  Leaves her RW in between her WC and bed in case she needs it.  ADL's / Homemaking Assistance Needed: sponge bath w/ assist of son's girlfriend to wash her back, family does cooking and cleaning        Hand Dominance   Dominant Hand: Right    Extremity/Trunk Assessment               Lower Extremity Assessment: Generalized weakness         Communication   Communication: No difficulties  Cognition Arousal/Alertness: Awake/alert Behavior During Therapy: WFL for tasks  assessed/performed Overall Cognitive Status: Within Functional Limits for tasks assessed                      General Comments General comments (skin integrity, edema, etc.): HR elevation with activity 130s on 4 liters O2  saturations stable >92%. (extensive education on energy conservation strategies)    Exercises        Assessment/Plan    PT Assessment Patient needs continued PT services  PT Diagnosis Difficulty walking   PT Problem List Decreased strength;Decreased activity tolerance;Decreased balance;Decreased mobility;Decreased knowledge of use of DME;Decreased safety awareness;Cardiopulmonary status limiting activity  PT Treatment Interventions DME instruction;Gait training;Functional mobility training;Therapeutic activities;Therapeutic exercise;Balance training;Neuromuscular re-education;Patient/family education   PT Goals (Current goals can be found in the Care Plan section) Acute Rehab PT Goals Patient Stated Goal: to go home PT Goal Formulation: With patient Time For Goal Achievement: 10/15/15 Potential to Achieve Goals: Good    Frequency Min 3X/week   Barriers to discharge Inaccessible home environment previous fall on steps at home    Co-evaluation               End of Session Equipment Utilized During Treatment: Oxygen Activity Tolerance: Patient limited by fatigue Patient left: in bed;with call bell/phone within reach (sitting EOB) Nurse Communication: Mobility status;Other (comment) (SpO2, HR)         Time: 4098-1191 PT Time Calculation (min) (ACUTE ONLY): 25 min   Charges:   PT Evaluation $PT Eval Moderate Complexity: 1 Procedure     PT G CodesFabio Asa Oct 31, 2015, 11:56 AM Charlotte Crumb, PT DPT  [phone]

## 2015-10-21 NOTE — Progress Notes (Signed)
Initial Nutrition Assessment  DOCUMENTATION CODES:   Non-severe (moderate) malnutrition in context of chronic illness  INTERVENTION:  -Glucerna Shake po TID, each supplement provides 220 kcal and 10 grams of protein  NUTRITION DIAGNOSIS:   Malnutrition related to chronic illness as evidenced by moderate depletions of muscle mass, moderate depletion of body fat, percent weight loss.  GOAL:   Patient will meet greater than or equal to 90% of their needs  MONITOR:   PO intake, Labs, Supplement acceptance, Skin, I & O's  REASON FOR ASSESSMENT:   Consult COPD Protocol  ASSESSMENT:   Brenda Wells is a 76 y.o. female with PMH of COPD with 4 Lpm oxygen requirement, chronic atrial fibrillation on Eliquis, and chronic diastolic CHF who presents to the ED with progressive dyspnea and increased nonproductive cough. This is Brenda Wells fifth admission in 3 months, last discharged 10/04/2015 following treatment of COPD exacerbation.   Spoke with pt at bedside. She endorses eating "everything she can get her hands on." She endorses that she has lost weight, but stated "I couldn't tell until they told me." When asked about fluid, she stated that it accumulates from time to time and that the weight loss could have been fluid.  Nutrition-Focused physical exam completed. Findings are moderaet fat depletion, moderate muscle depletion, and no edema.   Per patient, usual body weight is ~134# She exhibits an 8#5.8% severe wt loss in 1 month at this point. Unclear if it was mostly fluid, as patient suffers from Chronic Diastolic CHF in addition to End Stage COPD.  Pt has a palliative care consult in related to her end stage COPD, follow for goals of care.  Labs: CBGs 81-224, K 3.3, Cl 92, BUN 25, Ca 8.5, Mg 2.6 Medications: Solumedrol, Levaquin, Dilaudid, Eliquis  Diet Order:  Diet heart healthy/carb modified Room service appropriate?: Yes; Fluid consistency:: Thin  Skin:   Bilateral bruising  to arms.  Last BM:  PTA  Height:   Ht Readings from Last 1 Encounters:  10/21/15  (1.651 m)    Weight:   Wt Readings from Last 1 Encounters:  10/21/15 128 lb 14.4 oz (58.469 kg)    Ideal Body Weight:  56.81 kg  BMI:  Body mass index is 21.45 kg/(m^2).  Estimated Nutritional Needs:   Kcal:  1450-1750 calories  Protein:  55-70 grams  Fluid:  1.5L  EDUCATION NEEDS:   No education needs identified at this time  Brenda Ano. Banner Huckaba, MS, RD LDN After Hours/Weekend Pager [phone]

## 2015-10-21 NOTE — H&P (Signed)
[facility] History and Physical  Brenda Wells:[id number] DOB: 24-Jul-1941 DOA: 10/20/2015  Referring physician: ED physician PCP: [name], MD  Specialists:  Dr. Delford Field (pulmonology)   Chief Complaint:  Dyspnea, increased cough  HPI: Brenda Wells is a 75 y.o. female with PMH of COPD with 4 Lpm oxygen requirement, chronic atrial fibrillation on Eliquis, and chronic diastolic CHF who presents to the ED with progressive dyspnea and increased nonproductive cough. This is [name] fifth admission in 3 months, last discharged 10/04/2015 following treatment of COPD exacerbation. She describes making a full recovery back to her baseline since the recent discharge, but during the afternoon of her presentation, she developed worsening of her chronic nonproductive cough and worsening dyspnea. She gave herself 2 nebulized breathing treatments at home but remained dyspneic at rest. She checked her home pulse oximeter and found her set to be 83%. She called her son for advice and he activated EMS. Patient denies any recent fevers, chills, chest pain, or palpitations. There is no hemoptysis or leg pain. EMS found the patient to be saturating in the low 80s on 4 L/m supplemental oxygen in the field. They administered a nebulized breathing treatment en route to the hospital.  In ED, patient was found to be afebrile, saturating presently on 4 L, with tachypnea and tachycardia. EKG demonstrated an atrial tachyarrhythmia and chest x-ray was negative for any acute process. Blood work returned with a negative troponin, low BNP, leukocytosis to 16,000, stable normocytic anemia, small bump in serum creatinine to 1.1, and hyperglycemia. She was given a another nebulized treatment in the emergency department as well as Levaquin and prednisone. She remained fairly stable in the ED, but is unable to ambulate secondary to dyspnea and will be admitted for ongoing evaluation and management of suspected acute  exacerbation in COPD.  Where does patient live?   At home    Can patient participate in ADLs?  Yes        Review of Systems:   General: no fevers, chills, sweats, weight change, poor appetite, or fatigue HEENT: no blurry vision, hearing changes or sore throat Pulm: Acute on chronic dyspnea, increased cough CV: no chest pain or palpitations Abd: no nausea, vomiting, abdominal pain, diarrhea, or constipation GU: no dysuria, hematuria, increased urinary frequency, or urgency  Ext:  Leg edema, Lt > Rt Neuro: no focal weakness, numbness, or tingling, no vision change or hearing loss Skin: no rash, scattered abrasions MSK: No muscle spasm, no deformity, no red, hot, or swollen joint. Bruising at 1st left toe  Heme: No easy bruising or bleeding Travel history: No recent long distant travel    Allergy: No Known Allergies  Past Medical History  Diagnosis Date  . Acute respiratory failure (HCC)   . Community acquired pneumonia   . COPD with acute exacerbation (HCC)   . Anemia   . Hypokalemia   . Hyponatremia   . CHF (congestive heart failure) (HCC)   . Hypertension   . Shortness of breath dyspnea   . History of kidney stones     Past Surgical History  Procedure Laterality Date  . Back surgery    . Cyst excision      base of tongue  . Cholecystectomy    . Abdominal hysterectomy      Social History:  reports that she has been smoking Cigarettes.  She has a 6 pack-year smoking history. She has never used smokeless tobacco. She reports that she does not drink alcohol or  use illicit drugs.  Family History:  Family History  Problem Relation Age of Onset  . Diabetes Mellitus II Mother   . CAD Mother   . CAD Brother      Prior to Admission medications   Medication Sig Start Date End Date Taking? Authorizing Provider  albuterol (PROVENTIL) (2.5 MG/3ML) 0.083% nebulizer solution Take 3 mLs (2.5 mg total) by nebulization every 2 (two) hours as needed for wheezing or shortness of  breath. 09/07/15  Yes Richarda Overlie, MD  arformoterol (BROVANA) 15 MCG/2ML NEBU Take 2 mLs (15 mcg total) by nebulization 2 (two) times daily. 09/07/15  Yes Richarda Overlie, MD  diltiazem (CARDIZEM CD) 180 MG 24 hr capsule Take 1 capsule (180 mg total) by mouth daily. 09/07/15  Yes Richarda Overlie, MD  Fluticasone-Salmeterol (ADVAIR) 500-50 MCG/DOSE AEPB Inhale 1 puff into the lungs 2 (two) times daily.   Yes Historical Provider, MD  furosemide (LASIX) 40 MG tablet Take 1 tablet (40 mg total) by mouth daily. 10/04/15  Yes Maryann Mikhail, DO  predniSONE (DELTASONE) 10 MG tablet Take  (4 tabs) x 3 days, then taper to  (3 tabs) x 3 days, then  (2 tabs) x 3days, then  (1 tab) x 3days, then OFF. 10/04/15  Yes Maryann Mikhail, DO  SPIRIVA HANDIHALER 18 MCG inhalation capsule Place 18 mcg into inhaler and inhale daily.  08/24/15  Yes Historical Provider, MD  apixaban (ELIQUIS) 5 MG TABS tablet Take 1 tablet (5 mg total) by mouth 2 (two) times daily. Patient not taking: Reported on 10/20/2015 10/04/15   Edsel Petrin, DO    Physical Exam: Filed Vitals:   10/20/15 2221 10/20/15 2336  BP: 127/61   Pulse: 120   Temp: 98.1 F (36.7 C)   TempSrc: Oral   SpO2: 94% 98%   General: Acute respiratory distress with increased WOB, gasping between short sentences  HEENT:       Eyes: PERRL, EOMI, no scleral icterus or conjunctival pallor.       ENT: No discharge from the ears or nose, no pharyngeal ulcers, petechiae or exudate, no tonsillar enlargement.        Neck: No JVD, no bruit, no appreciable mass Heme: No cervical adenopathy, no pallor Cardiac: Rate ~100 and irregular, No murmurs, No gallops or rubs. Pulm: Markedly diminished breath sounds b/l with occasional end-expiratory wheeze. Symmetric chest wall expansion, no pallor or cyanosis. Abd: Soft, nondistended, nontender, no rebound pain or gaurding, no mass or organomegaly, BS present. Ext: 1+DP/PT pulse bilaterally. Bilateral pedal edema, Lt >  Rt.  Ecchymosis overlies 1st MTP on left Musculoskeletal: No gross deformity, no red, hot, swollen joints   Skin: No rashes on exposed surfaces. Scattered superficial abrasions and skin tears with crust Neuro: Alert, oriented X3, cranial nerves II-XII grossly intact. No focal findings Psych: Patient is not overtly psychotic, appropriate mood and affect.  Labs on Admission:  Basic Metabolic Panel:  Recent Labs Lab 10/20/15 2215  NA 141  K 3.5  CL 93*  CO2 34*  GLUCOSE 143*  BUN 29*  CREATININE 1.11*  CALCIUM 9.0   Liver Function Tests: No results for input(s): AST, ALT, ALKPHOS, BILITOT, PROT, ALBUMIN in the last 168 hours. No results for input(s): LIPASE, AMYLASE in the last 168 hours. No results for input(s): AMMONIA in the last 168 hours. CBC:  Recent Labs Lab 10/20/15 2215  WBC 16.2*  NEUTROABS 11.6*  HGB 10.3*  HCT 34.2*  MCV 83.4  PLT 256   Cardiac Enzymes: No  results for input(s): CKTOTAL, CKMB, CKMBINDEX, TROPONINI in the last 168 hours.  BNP (last 3 results)  Recent Labs  09/02/15 0124 09/09/15 1350 10/20/15 2215  BNP 64.9 102.1* 60.1    ProBNP (last 3 results) No results for input(s): PROBNP in the last 8760 hours.  CBG: No results for input(s): GLUCAP in the last 168 hours.  Radiological Exams on Admission: Dg Chest 2 View  10/20/2015  CLINICAL DATA:  Cough and congestion EXAM: CHEST  2 VIEW COMPARISON:  10/01/2015 FINDINGS: The heart size appears mildly enlarged. Aortic atherosclerosis identified. There is no pleural effusion or edema identified. The lungs are hyperinflated but clear. Coarsened interstitial markings suggestive of COPD noted. IMPRESSION: 1. Suspect emphysema 2. Mild cardiac enlargement and aortic atherosclerosis. Electronically Signed   By: Signa Kell M.D.   On: 10/20/2015 22:54    EKG: Independently reviewed.  Abnormal findings:  Atrial tachyarrhythmia    Assessment/Plan  1. COPD exacerbation with acute on chronic hypoxic  respiratory failure  - Uncertain precipitant, no convincing infectious s/s identified  - This is 5th admit since November '16  - DuoNebs scheduled q6h, available q2h prn  - Systemic steroids with Solu-Medrol 60 mg q6h  - Levaquin 500 mg q24h  - Continue home Symbicort with formulary equivalent, Dulera  - Continue home Brovana BID  - Continuous pulse oximetry, titrate supplemental O2 to maintain sats in low 90s  - Palliative consult requested for symptom management  2. Chronic atrial fibrillation  - Appears to be in MAT on admission EKG  - Continue Eliquis  - Continue Cardizem for rate-control  - Monitoring on telemetry    3. Chronic diastolic CHF  - TTE (07/22/15) EF 50-55%  - BNP 60 on admission and no edema on CXR  - Appears dry clinically with poor skin turgor and dry oral mucosa; elevated BUN:SCr ratio supports  - Hold home Lasix tonight, resume as appropriate  - SLIV, daily wts, I/Os   4. Normocytic anemia  - Hgb 10.3 on arrival, stable from prior measurements  - No sign of active blood loss   5. Hyperglycemia  - Likely steroid-induced as was on prednisone outpt  - Continuing systemic steroids with Solu-Medrol - Will check CBGs with meals and qHS, use sensitive-scale SSI prn    6. Leukocytosis  - WBC 16,200 on admission  - Suspected secondary to steroid-induced demargination  - No fever or infectious sxs - Treating with Levaquin as above     DVT ppx:  Continue Eliquis  Code Status: Full code Family Communication: None at bed side.            Disposition Plan: Admit to inpatient   Date of Service 10/21/2015    Briscoe Deutscher, MD [facility] Pager [phone]  If 7PM-7AM, please contact night-coverage [email or web address] Password [email or web address] 10/21/2015, 12:30 AM

## 2015-10-22 DIAGNOSIS — Z66 Do not resuscitate: Secondary | ICD-10-CM

## 2015-10-22 DIAGNOSIS — Z7189 Other specified counseling: Secondary | ICD-10-CM | POA: Insufficient documentation

## 2015-10-22 LAB — HEMOGLOBIN A1C
Hgb A1c MFr Bld: 6.8 % — ABNORMAL HIGH (ref 4.8–5.6)
Hgb A1c MFr Bld: 6.7 % — ABNORMAL HIGH (ref 4.8–5.6)
MEAN PLASMA GLUCOSE: 148 mg/dL
Mean Plasma Glucose: 146 mg/dL

## 2015-10-22 LAB — GLUCOSE, CAPILLARY
GLUCOSE-CAPILLARY: 190 mg/dL — AB (ref 65–99)
GLUCOSE-CAPILLARY: 187 mg/dL — AB (ref 65–99)
GLUCOSE-CAPILLARY: 176 mg/dL — AB (ref 65–99)
Glucose-Capillary: 203 mg/dL — ABNORMAL HIGH (ref 65–99)

## 2015-10-22 MED ORDER — METHYLPREDNISOLONE SODIUM SUCC 125 MG IJ SOLR
60.0000 mg | Freq: Two times a day (BID) | INTRAMUSCULAR | Status: DC
  Administered 2015-10-22 – 2015-10-23 (×2): 60 mg via INTRAVENOUS
  Filled 2015-10-22 (×2): qty 2

## 2015-10-22 MED ORDER — IPRATROPIUM-ALBUTEROL 0.5-2.5 (3) MG/3ML IN SOLN
3.0000 mL | Freq: Two times a day (BID) | RESPIRATORY_TRACT | Status: DC
  Administered 2015-10-22 – 2015-10-23 (×2): 3 mL via RESPIRATORY_TRACT
  Filled 2015-10-22: qty 3

## 2015-10-22 NOTE — Progress Notes (Signed)
Occupational Therapy Evaluation Patient Details Name: Brenda Wells MRN: [id number] DOB: Jan 17, 1941 Today's Date: 10/22/2015    History of Present Illness 75 y.o. female with PMH of COPD with 4 Lpm oxygen requirement, chronic atrial fibrillation on Eliquis, and chronic diastolic CHF who presents to the ED with progressive dyspnea and increased nonproductive cough   Clinical Impression   Patient presents at baseline with ADLs at this time. Provided energy conservation handout and educated on energy conservation techniques. No further OT needs. Will sign off.    Follow Up Recommendations  No OT follow up;Supervision/Assistance - 24 hour    Equipment Recommendations  None recommended by OT    Recommendations for Other Services       Precautions / Restrictions Precautions Precautions: Fall Restrictions Weight Bearing Restrictions: No      Mobility Bed Mobility               General bed mobility comments: up in chair  Transfers Overall transfer level: Modified independent Equipment used: None   Sit to Stand: Modified independent (Device/Increase time)              Balance                                            ADL Overall ADL's : At baseline Eating/Feeding: Independent;Sitting   Grooming: Set up   Upper Body Bathing: Minimal assitance;Sitting   Lower Body Bathing: Set up;Sit to/from stand   Upper Body Dressing : Set up;Sitting   Lower Body Dressing: Set up;Sit to/from stand   Toilet Transfer: Supervision/safety;Ambulation;Comfort height toilet   Toileting- Clothing Manipulation and Hygiene: Supervision/safety;Sit to/from stand       Functional mobility during ADLs: Supervision/safety General ADL Comments: Educated patient on energy conservation techniques and provided handout. Patient ambulated within room with O2 and no assistive device S level, reports she can do own bathing and dressing and demonstrated ability to reach  all body parts except her back. No further OT needs at this time.     Vision     Perception     Praxis      Pertinent Vitals/Pain Pain Assessment: No/denies pain     Hand Dominance Right   Extremity/Trunk Assessment Upper Extremity Assessment Upper Extremity Assessment: Overall WFL for tasks assessed   Lower Extremity Assessment Lower Extremity Assessment: Defer to PT evaluation       Communication Communication Communication: No difficulties   Cognition Arousal/Alertness: Awake/alert Behavior During Therapy: WFL for tasks assessed/performed Overall Cognitive Status: Within Functional Limits for tasks assessed                     General Comments       Exercises       Shoulder Instructions      Home Living Family/patient expects to be discharged to:: Private residence Living Arrangements: Children Available Help at Discharge: Family;Available 24 hours/day Type of Home: House Home Access: Stairs to enter [employer] of Steps: 5 Entrance Stairs-Rails: Left;Right Home Layout: One level     Bathroom Shower/Tub: Walk-in shower;Tub/shower unit (pt reports she takes a sponge bath)   Bathroom Toilet: Standard     Home Equipment: Walker - 2 wheels;Bedside commode;Wheelchair - [employer] - standard          Prior Functioning/Environment Level of Independence: Needs assistance  Gait / Transfers Assistance Needed: Ind  w/ ambulating short distance from bed to WC w/o AD.  Leaves her RW in between her WC and bed in case she needs it. ADL's / Homemaking Assistance Needed: sponge bath w/ assist of son's girlfriend to wash her back, family does cooking and cleaning        OT Diagnosis: Generalized weakness   OT Problem List: Decreased strength;Decreased activity tolerance;Cardiopulmonary status limiting activity   OT Treatment/Interventions:      OT Goals(Current goals can be found in the care plan section) Acute Rehab OT Goals Patient  Stated Goal: to go home OT Goal Formulation: All assessment and education complete, DC therapy  OT Frequency:     Barriers to D/C:            Co-evaluation              End of Session Equipment Utilized During Treatment: Oxygen Nurse Communication: Mobility status  Activity Tolerance: Patient tolerated treatment well Patient left: in chair;with call bell/phone within reach;with nursing/sitter in room   Time: 1610-9604 OT Time Calculation (min): 15 min Charges:  OT General Charges $OT Visit: 1 Procedure OT Evaluation $OT Eval Moderate Complexity: 1 Procedure G-Codes:    Eliceo Gladu A 2015/10/23, 11:48 AM

## 2015-10-22 NOTE — Progress Notes (Addendum)
Please write for Oxy IR 5 mg q 6 hours PRN Shortness of Breath at discharge.                                                                                                                                                                                                             Daily Progress Note   Patient Name: Brenda Wells       Date: 10/22/2015 DOB: Feb 02, 1941  Age: 75 y.o. MRN#: [id number] Attending Physician: Cathren Harsh, MD Primary Care Physician: [name], MD Admit Date: 10/20/2015  Reason for Consultation/Follow-up: Establishing goals of care and Hospice Evaluation  Subjective: Feeling better.  Patient reports she is unable to afford her combivent at home.   The cost has risen from $7 to $189.  She feels this is the medicine that helps her the most.  We discussed hospice once again.  She is open to trying a new agency (she heard bad things about the old agency in her area).  Patient also complains that she did not receive a paper prescription for Oxy IR when she was discharged in January (It was ordered for shortness of breath).  Interval Events: Requested case management consults for medication assistance and home hospice services.   Length of Stay: 1 day  Current Medications: Scheduled Meds:  . apixaban  5 mg Oral BID  . arformoterol  15 mcg Nebulization BID  . diltiazem  180 mg Oral Daily  . feeding supplement (GLUCERNA SHAKE)  237 mL Oral TID BM  . furosemide  40 mg Oral Daily  . guaiFENesin  600 mg Oral BID  . insulin aspart  0-15 Units Subcutaneous TID WC  . insulin aspart  0-5 Units Subcutaneous QHS  . insulin aspart  3 Units Subcutaneous TID WC  . ipratropium-albuterol  3 mL Nebulization BID  . levofloxacin (LEVAQUIN) IV  500 mg Intravenous Q24H  . methylPREDNISolone (SOLU-MEDROL) injection  60 mg Intravenous Q6H  . mometasone-formoterol  2 puff Inhalation BID  . sodium chloride flush  3 mL Intravenous Q12H    Continuous Infusions:    PRN  Meds: acetaminophen **OR** acetaminophen, bisacodyl, HYDROmorphone (DILAUDID) injection, ipratropium-albuterol, ondansetron **OR** ondansetron (ZOFRAN) IV, oxyCODONE, senna-docusate   Physical Exam      Very pleasant elderly female, NAD CV RRR Respirations mildly increased work of breathing.  N/C in place Abdomen:  Soft, Nd, ND, +bs Extremities:  No edema, able to move all 4.          Vital Signs: BP 116/74 mmHg  Pulse 99  Temp(Src) 98.3 F (36.8 C) (Oral)  Resp 18  Ht 5\' 5"  (1.651 m)  Wt 59.966 kg (132 lb 3.2 oz)  BMI 22.00 kg/m2  SpO2 99% SpO2: SpO2: 99 % O2 Device: O2 Device: Nasal Cannula O2 Flow Rate: O2 Flow Rate (L/min): 4 L/min  Intake/output summary:  Intake/Output Summary (Last 24 hours) at 10/22/15 1320 Last data filed at 10/22/15 1119  Gross per 24 hour  Intake   1553 ml  Output   3650 ml  Net  -2097 ml   LBM: Last BM Date: 10/19/15 Baseline Weight: Weight: 58.469 kg (128 lb 14.4 oz) Most recent weight: Weight: 59.966 kg (132 lb 3.2 oz) (scale b)       Palliative Assessment/Data: Flowsheet Rows        Most Recent Value   Intake Tab    Referral Department  Hospitalist   Unit at Time of Referral  Cardiac/Telemetry Unit   Palliative Care Primary Diagnosis  Pulmonary   Date Notified  10/21/15   Palliative Care Type  Return patient Palliative Care   Reason for referral  Clarify Goals of Care   Date of Admission  10/20/15   # of days IP prior to Palliative referral  1   Clinical Assessment    Psychosocial & Spiritual Assessment    Palliative Care Outcomes       Additional Data Reviewed: CBC    Component Value Date/Time   WBC 13.7* 10/21/2015 0417   RBC 3.62* 10/21/2015 0417   RBC 3.25* 09/30/2015 1440   HGB 9.2* 10/21/2015 0417   HCT 29.9* 10/21/2015 0417   PLT 231 10/21/2015 0417   MCV 82.6 10/21/2015 0417   MCH 25.4* 10/21/2015 0417   MCHC 30.8 10/21/2015 0417   RDW 23.8* 10/21/2015 0417   LYMPHSABS 1.1 10/21/2015 0417   MONOABS 0.4  10/21/2015 0417   EOSABS 0.0 10/21/2015 0417   BASOSABS 0.0 10/21/2015 0417    CMP     Component Value Date/Time   NA 137 10/21/2015 0417   K 3.3* 10/21/2015 0417   CL 92* 10/21/2015 0417   CO2 35* 10/21/2015 0417   GLUCOSE 174* 10/21/2015 0417   BUN 25* 10/21/2015 0417   CREATININE 0.89 10/21/2015 0417   CALCIUM 8.5* 10/21/2015 0417   PROT 5.2* 10/21/2015 0417   ALBUMIN 2.8* 10/21/2015 0417   AST 15 10/21/2015 0417   ALT 15 10/21/2015 0417   ALKPHOS 57 10/21/2015 0417   BILITOT <0.1* 10/21/2015 0417   GFRNONAA >60 10/21/2015 0417   GFRAA >60 10/21/2015 0417       Problem List:  Patient Active Problem List   Diagnosis Date Noted  . Normocytic anemia 10/21/2015  . Leukocytosis 10/21/2015  . Palliative care encounter 09/30/2015  . Dyspnea 09/30/2015  . DNR (do not resuscitate) 09/30/2015  . Atrial fibrillation with RVR (HCC) 09/29/2015  . Acute respiratory failure (HCC) 09/29/2015  . Steroid-induced hyperglycemia 09/09/2015  . GERD (gastroesophageal reflux disease) 09/02/2015  . Chronic diastolic (congestive) heart failure (HCC) 09/02/2015  . Chronic anticoagulation - Coumadin, CHADS2VASC=4 08/20/2015  . Shortness of breath   . Acute on chronic respiratory failure with hypoxia (HCC) 07/22/2015  . Chronic atrial fibrillation (HCC) 07/22/2015  . History of DVT (deep vein thrombosis) 07/22/2015  . Anemia 07/22/2015  . COPD exacerbation (HCC) 07/21/2015     Palliative Care Assessment & Plan    1.Code Status:  DNR  2. Goals of Care/Additional Recommendations:  - Oxy IR 5 mg every 4 hours prn SOB.  -  Her main goal is staying as independent and functional as possible.  - Also interested in trying to stay out of the hospital.  - Will consider hospice at home.   3. Prognosis: Given atrial fibrillation, end stage COPD, and recurrent hospitalizations she likely has a prognosis of less than 6 months.  4.  Discharge Planning:  Home with hospice.   Care  plan was discussed with Patient.  Thank you for allowing the Palliative Medicine Team to assist in the care of this patient.   Time In: 1230 Time Out: 1255 Total Time 25 Prolonged Time Billed no       Algis Downs, [place] [facility] Pager: [phone]    10/22/2015, 1:20 PM  Please contact Palliative Medicine Team phone at [phone] for questions and concerns.

## 2015-10-22 NOTE — Progress Notes (Signed)
[facility]                                                                              Patient Demographics  Brenda Wells, is a 75 y.o. female, DOB - [birth year], ZOX:[id number]  Admit date - 10/20/2015   Admitting Physician Briscoe Deutscher, MD  Outpatient Primary MD for the patient is [name],[name], MD  LOS - 1   Chief Complaint  Patient presents with  . Respiratory Distress       Brief HPI   Per Dr. Antionette Char on 2/2 Brenda Wells is a 75 y.o. female with PMH of COPD with 4 Lpm oxygen requirement, chronic atrial fibrillation on Eliquis, and chronic diastolic CHF presented to the ED with progressive dyspnea and increased nonproductive cough. This is Ms. Shugrue fifth admission in 3 months, last discharged 10/04/2015 following treatment of COPD exacerbation. She described making a full recovery back to her baseline since the recent discharge, but during the afternoon of her presentation, she developed worsening of her chronic nonproductive cough and worsening dyspnea. She gave herself 2 nebulized breathing treatments at home but remained dyspneic at rest. She checked her home pulse oximeter and found her set to be 83%. She called her son for advice and he activated EMS. Patient denied any recent fevers, chills, chest pain, or palpitations. There is no hemoptysis or leg pain. EMS found the patient to be saturating in the low 80s on 4 L/m.    In ED, patient was found to be afebrile, saturating presently on 4 L, with tachypnea and tachycardia. EKG demonstrated an atrial tachyarrhythmia and chest x-ray was negative for any acute process. Blood work returned with a negative troponin, low BNP, leukocytosis to 16,000, stable normocytic anemia, small bump in serum creatinine to 1.1, and hyperglycemia.   Assessment & Plan    Principal Problem: COPD exacerbation with acute on chronic hypoxic respiratory failure - improving today -  This is 5th admit since November '16  -  Continue duo nebs, IV Solu-Medrol, Levaquin.   - Continue Symbicort, Dulera, Brovana  - Taper IV Solu-Medrol, transition to oral prednisone in a.m. - Palliative medicine consulted. Appreciate recommendations - will place on OxyIR 5 mg every 4 hours as needed for shortness of breath   Chronic atrial fibrillation  - Appears to be in MAT on admission EKG  - Continue Eliquis and Cardizem - CHADS vasc 4    Chronic diastolic CHF  - TTE (07/22/15) EF 50-55%  - BNP 60 on admission and no edema on CXR  - Appeared to be clinically dry, hold home dose of Lasix, follow I's and O's and daily weights.   Normocytic anemia  - Hgb 10.3 on arrival, stable from prior measurements  - No sign of active blood loss    Hyperglycemia  - Likely steroid-induced as was on prednisone outpt  - Continuing systemic steroids with Solu-Medrol - Placed on moderate sliding scale, meal coverage   Leukocytosis  - WBC 16,200 on admission  - Suspected secondary to steroid-induced demargination  - No fever or infectious sxs - Treating with Levaquin as above   Code Status: dnr  Family Communication: Discussed in detail with the patient, all imaging results, lab results explained to the patient    Disposition Plan: Hopefully DC home in a.m.  Time Spent in minutes  25 minutes  Procedures  None   Consults   None   DVT Prophylaxis  eliquis  Medications  Scheduled Meds: . apixaban  5 mg Oral BID  . arformoterol  15 mcg Nebulization BID  . diltiazem  180 mg Oral Daily  . feeding supplement (GLUCERNA SHAKE)  237 mL Oral TID BM  . furosemide  40 mg Oral Daily  . guaiFENesin  600 mg Oral BID  . insulin aspart  0-15 Units Subcutaneous TID WC  . insulin aspart  0-5 Units Subcutaneous QHS  . insulin aspart  3 Units Subcutaneous TID WC  . ipratropium-albuterol  3 mL Nebulization BID  . levofloxacin (LEVAQUIN) IV  500 mg Intravenous Q24H  . methylPREDNISolone (SOLU-MEDROL) injection  60 mg  Intravenous Q6H  . mometasone-formoterol  2 puff Inhalation BID  . sodium chloride flush  3 mL Intravenous Q12H   Continuous Infusions:  PRN Meds:.acetaminophen **OR** acetaminophen, bisacodyl, HYDROmorphone (DILAUDID) injection, ipratropium-albuterol, ondansetron **OR** ondansetron (ZOFRAN) IV, oxyCODONE, senna-docusate   Antibiotics   Anti-infectives    Start     Dose/Rate Route Frequency Ordered Stop   10/21/15 0200  levofloxacin (LEVAQUIN) IVPB 500 mg     500 mg 100 mL/hr over 60 Minutes Intravenous Every 24 hours 10/21/15 0030     10/21/15 0000  levofloxacin (LEVAQUIN) IVPB 500 mg  Status:  Discontinued     500 mg 100 mL/hr over 60 Minutes Intravenous  Once 10/20/15 2347 10/21/15 0030        Subjective:   Brenda Wells was seen and examined today.  Overall improving, no significant wheezing today.  No fevers or chills. Patient denies dizziness, chest pain, abdominal pain, N/V/D/C, new weakness, numbess, tingling. No acute events overnight.    Objective:   Blood pressure 116/74, pulse 99, temperature 98.3 F (36.8 C), temperature source Oral, resp. rate 18, height 5\' 5"  (1.651 m), weight 59.966 kg (132 lb 3.2 oz), SpO2 99 %.  Wt Readings from Last 3 Encounters:  10/22/15 59.966 kg (132 lb 3.2 oz)  10/04/15 61.961 kg (136 lb 9.6 oz)  09/12/15 65.2 kg (143 lb 11.8 oz)     Intake/Output Summary (Last 24 hours) at 10/22/15 1427 Last data filed at 10/22/15 1425  Gross per 24 hour  Intake   2030 ml  Output   4100 ml  Net  -2070 ml    Exam  General: Alert and oriented x 3, NAD  HEENT:  PERRLA, EOMI,   Neck: Supple, no JVD, no masses  CVS: S1 S2 auscultated, no rubs, murmurs or gallops.  Respiratory: CTA B  Abdomen : Soft, nontender, nondistended, + bowel sounds  Ext: no cyanosis clubbing or edema  Neuro: no new deficits  Skin: No rashes  Psych: Normal affect and demeanor, alert and oriented x3    Data Review   Micro Results Recent Results (from the  past 240 hour(s))  MRSA PCR Screening     Status: None   Collection Time: 10/21/15  1:26 AM  Result Value Ref Range Status   MRSA by PCR NEGATIVE NEGATIVE Final    Comment:        The GeneXpert MRSA Assay (FDA approved for NASAL specimens only), is one component of a comprehensive MRSA colonization surveillance program. It is not intended to diagnose MRSA infection nor  to guide or monitor treatment for MRSA infections.     Radiology Reports Dg Chest 2 View  10/20/2015  CLINICAL DATA:  Cough and congestion EXAM: CHEST  2 VIEW COMPARISON:  10/01/2015 FINDINGS: The heart size appears mildly enlarged. Aortic atherosclerosis identified. There is no pleural effusion or edema identified. The lungs are hyperinflated but clear. Coarsened interstitial markings suggestive of COPD noted. IMPRESSION: 1. Suspect emphysema 2. Mild cardiac enlargement and aortic atherosclerosis. Electronically Signed   By: Signa Kell M.D.   On: 10/20/2015 22:54   Dg Chest 2 View  09/28/2015  CLINICAL DATA:  Shortness of breath, weakness and history of COPD. EXAM: CHEST - 2 VIEW COMPARISON:  09/13/2015 FINDINGS: Stable advanced emphysematous lung disease with moderate bilateral hyperinflation. There is no evidence of pulmonary edema, consolidation, pneumothorax or nodule. Tiny left pleural effusion present. Stable top-normal heart size. IMPRESSION: Stable advanced emphysema with hyperinflation. Small left pleural effusion without overt pulmonary edema or focal infiltrate. Electronically Signed   By: Irish Lack M.D.   On: 09/28/2015 20:39   Ct Angio Chest Pe W/cm &/or Wo Cm  09/29/2015  CLINICAL DATA:  Patient with a DVT in the right lower extremity. History of COPD. Worsening shortness of breath. Initial encounter. EXAM: CT ANGIOGRAPHY CHEST WITH CONTRAST TECHNIQUE: Multidetector CT imaging of the chest was performed using the standard protocol during bolus administration of intravenous contrast. Multiplanar CT  image reconstructions and MIPs were obtained to evaluate the vascular anatomy. CONTRAST:  80 mL OMNIPAQUE IOHEXOL 350 MG/ML SOLN COMPARISON:  CT chest 07/22/2015.  PA and lateral chest 09/28/2015. FINDINGS: No pulmonary embolus is identified. Extensive aortic and coronary atherosclerosis is seen. No pathologic lymphadenopathy by CT size criteria in the axilla, hila or mediastinum is identified. Very small bilateral pleural effusions are seen and decreased since the prior CT. Heart size is mildly enlarged. The lungs demonstrate extensive emphysematous change. Chronic atelectasis in the left lower lobe is unchanged. Volume loss in the right middle lobe seen on the prior CT has resolved. The lungs are otherwise clear. Visualized upper abdomen shows an unchanged low-attenuation lesion in the caudate lobe of the liver most consistent with a cyst. No focal bony abnormality is identified. Review of the MIP images confirms the above findings. IMPRESSION: Negative for pulmonary embolus or acute disease. Trace bilateral pleural effusions are decreased since the prior CT. Emphysema. Mild cardiomegaly. Calcific aortic and coronary atherosclerosis. Electronically Signed   By: Drusilla Kanner M.D.   On: 09/29/2015 09:01   Dg Chest Port 1 View  10/01/2015  CLINICAL DATA:  Worsening shortness of breath EXAM: PORTABLE CHEST 1 VIEW COMPARISON:  09/28/2015 FINDINGS: Chronic cardiopericardial enlargement. Stable aortic contours. There is a small left pleural effusion. Interstitial coarsening without edema. No consolidation or pneumothorax. IMPRESSION: 1. Pulmonary venous congestion and small left effusion. 2. COPD. Electronically Signed   By: Marnee Spring M.D.   On: 10/01/2015 13:17    CBC  Recent Labs Lab 10/20/15 2215 10/21/15 0417  WBC 16.2* 13.7*  HGB 10.3* 9.2*  HCT 34.2* 29.9*  PLT 256 231  MCV 83.4 82.6  MCH 25.1* 25.4*  MCHC 30.1 30.8  RDW 24.0* 23.8*  LYMPHSABS 3.6 1.1  MONOABS 1.0 0.4  EOSABS 0.0 0.0   BASOSABS 0.0 0.0    Chemistries   Recent Labs Lab 10/20/15 2215 10/21/15 0417  NA 141 137  K 3.5 3.3*  CL 93* 92*  CO2 34* 35*  GLUCOSE 143* 174*  BUN 29* 25*  CREATININE  1.11* 0.89  CALCIUM 9.0 8.5*  AST  --  15  ALT  --  15  ALKPHOS  --  57  BILITOT  --  <0.1*   ------------------------------------------------------------------------------------------------------------------ estimated creatinine clearance is 49.9 mL/min (by C-G formula based on Cr of 0.89). ------------------------------------------------------------------------------------------------------------------  Recent Labs  10/21/15 0107 10/21/15 1410  HGBA1C 6.8* 6.7*   ------------------------------------------------------------------------------------------------------------------ No results for input(s): CHOL, HDL, LDLCALC, TRIG, CHOLHDL, LDLDIRECT in the last 72 hours. ------------------------------------------------------------------------------------------------------------------ No results for input(s): TSH, T4TOTAL, T3FREE, THYROIDAB in the last 72 hours.  Invalid input(s): FREET3 ------------------------------------------------------------------------------------------------------------------ No results for input(s): VITAMINB12, FOLATE, FERRITIN, TIBC, IRON, RETICCTPCT in the last 72 hours.  Coagulation profile No results for input(s): INR, PROTIME in the last 168 hours.  No results for input(s): DDIMER in the last 72 hours.  Cardiac Enzymes No results for input(s): CKMB, TROPONINI, MYOGLOBIN in the last 168 hours.  Invalid input(s): CK ------------------------------------------------------------------------------------------------------------------ Invalid input(s): POCBNP   Recent Labs  10/21/15 0717 10/21/15 1115 10/21/15 1623 10/21/15 2142 10/22/15 0553 10/22/15 1117  GLUCAP 210* 319* 217* 224* 190* 187*     RAI,RIPUDEEP M.D. [facility] 10/22/2015, 2:27 PM  Pager:  [phone] Between 7am to 7pm - call Pager - [phone]  After 7pm go to [email or web address] - password [email or web address]  Call night coverage person covering after 7pm

## 2015-10-22 NOTE — Progress Notes (Signed)
Pt a/o, no c/o pain, pt resting comfortably, pt on IV Levaquin, VSS, pt stable

## 2015-10-22 NOTE — Progress Notes (Signed)
CM talked to patient about home hospice choice, patient chose [facility]. Referral made as requested. Bambi with [facility] in to see patient. All of her medication / DME will be arranged through Hospice. Abelino Derrick [facility] [phone]

## 2015-10-22 NOTE — Progress Notes (Signed)
Physical Therapy Treatment Patient Details Name: Brenda Wells MRN: [id number] DOB: [birth year] Today's Date: 10/22/2015    History of Present Illness 75 y.o. female with PMH of COPD with 4 Lpm oxygen requirement, chronic atrial fibrillation on Eliquis, and chronic diastolic CHF who presents to the ED with progressive dyspnea and increased nonproductive cough    PT Comments    Patient ambulated on 4 liters without device today. Initially very steady. As distance progressed, HR elevated to 150s with increased WOB and bilateral LE fatigue. Multiple rest breaks. Performed more stair negotiation practice this session with max cues for technique and positioning. Note RLE buckling requiring moderate assist and UE support. Returned to room and educated extensively on energy conservation and activity management. Will continue to see as indicated and progress as tolerated.   Follow Up Recommendations  Home health PT;Supervision for mobility/OOB     Equipment Recommendations  None recommended by PT    Recommendations for Other Services OT consult     Precautions / Restrictions Precautions Precautions: Fall Precaution Comments: watch HR and sats Restrictions Weight Bearing Restrictions: No    Mobility  Bed Mobility               General bed mobility comments: up in chair  Transfers Overall transfer level: Modified independent Equipment used: None   Sit to Stand: Modified independent (Device/Increase time)            Ambulation/Gait Ambulation/Gait assistance: Supervision;Min guard Ambulation Distance (Feet): 280 Feet Assistive device: Rolling walker (2 wheeled) Gait Pattern/deviations: Step-through pattern;Decreased stride length Gait velocity: decreased Gait velocity interpretation: Below normal speed for age/gender General Gait Details: Patient with 3 standing rest breaks, increased HR 150s with increased WOB and DOE. Increased bilateral LE cramping and instability  with fatigue. One seated rest break prior to stair negotiation   Stairs Stairs: Yes Stairs assistance: Mod assist Stair Management: One rail Right;Forwards;Step to pattern Number of Stairs: 3 General stair comments: increased effort, poor ability to power up during ascent. Assist for stability and technique  Wheelchair Mobility    Modified Rankin (Stroke Patients Only)       Balance           Standing balance support: During functional activity Standing balance-Leahy Scale: Fair                      Cognition Arousal/Alertness: Awake/alert Behavior During Therapy: WFL for tasks assessed/performed Overall Cognitive Status: Within Functional Limits for tasks assessed                      Exercises      General Comments        Pertinent Vitals/Pain Pain Assessment: No/denies pain    Home Living Family/patient expects to be discharged to:: Private residence Living Arrangements: Children Available Help at Discharge: Family;Available 24 hours/day Type of Home: House Home Access: Stairs to enter Entrance Stairs-Rails: Left;Right Home Layout: One level Home Equipment: Environmental consultant - 2 wheels;Bedside commode;Wheelchair - [employer] - standard      Prior Function Level of Independence: Needs assistance  Gait / Transfers Assistance Needed: Ind w/ ambulating short distance from bed to WC w/o AD.  Leaves her RW in between her WC and bed in case she needs it. ADL's / Homemaking Assistance Needed: sponge bath w/ assist of son's girlfriend to wash her back, family does cooking and cleaning     PT Goals (current goals can now be found in the  care plan section) Acute Rehab PT Goals Patient Stated Goal: to go home PT Goal Formulation: With patient Time For Goal Achievement: 10/15/15 Potential to Achieve Goals: Good Progress towards PT goals: Progressing toward goals    Frequency  Min 3X/week    PT Plan Current plan remains appropriate     Co-evaluation             End of Session Equipment Utilized During Treatment: Oxygen Activity Tolerance: Patient limited by fatigue Patient left: in chair;with call bell/phone within reach (sitting EOB)     Time: 0950-1010 PT Time Calculation (min) (ACUTE ONLY): 20 min  Charges:  $Gait Training: 8-22 mins                    G CodesFabio Asa 11-05-2015, 1:25 PM Charlotte Crumb, PT DPT  [phone]

## 2015-10-23 DIAGNOSIS — Z515 Encounter for palliative care: Secondary | ICD-10-CM

## 2015-10-23 LAB — GLUCOSE, CAPILLARY
Glucose-Capillary: 169 mg/dL — ABNORMAL HIGH (ref 65–99)
Glucose-Capillary: 219 mg/dL — ABNORMAL HIGH (ref 65–99)

## 2015-10-23 MED ORDER — APIXABAN 5 MG PO TABS: 5.0000 mg | ORAL_TABLET | Freq: Two times a day (BID) | ORAL | Status: AC

## 2015-10-23 MED ORDER — OXYCODONE HCL 5 MG PO TABS: 5.0000 mg | ORAL_TABLET | ORAL | Status: DC | PRN

## 2015-10-23 MED ORDER — PREDNISONE 50 MG PO TABS
60.0000 mg | ORAL_TABLET | Freq: Every day | ORAL | Status: DC
  Administered 2015-10-23: 60 mg via ORAL

## 2015-10-23 MED ORDER — LEVOFLOXACIN 500 MG PO TABS: 500.0000 mg | ORAL_TABLET | Freq: Every day | ORAL | Status: DC

## 2015-10-23 MED ORDER — IPRATROPIUM-ALBUTEROL 0.5-2.5 (3) MG/3ML IN SOLN: 3.0000 mL | Freq: Three times a day (TID) | RESPIRATORY_TRACT | Status: DC

## 2015-10-23 MED ORDER — PREDNISONE 10 MG PO TABS: ORAL_TABLET | ORAL | Status: DC

## 2015-10-23 MED ORDER — LEVOFLOXACIN 500 MG PO TABS
500.0000 mg | ORAL_TABLET | Freq: Every day | ORAL | Status: DC
  Administered 2015-10-23: 500 mg via ORAL
  Filled 2015-10-23: qty 1

## 2015-10-23 MED ORDER — GUAIFENESIN ER 600 MG PO TB12: 600.0000 mg | ORAL_TABLET | Freq: Two times a day (BID) | ORAL | Status: AC

## 2015-10-23 MED ORDER — METFORMIN HCL 500 MG PO TABS: 500.0000 mg | ORAL_TABLET | Freq: Every day | ORAL | Status: DC

## 2015-10-23 NOTE — Discharge Summary (Signed)
Physician Discharge Summary   Patient ID: Brenda Wells MRN: [id number] DOB/AGE: 01/02/1941 75 y.o.  Admit date: 10/20/2015 Discharge date: 10/23/2015  Primary Care Physician:  Brenda Rakes, MD  Discharge Diagnoses:    . COPD exacerbation (HCC) . Chronic diastolic (congestive) heart failure (HCC) . Chronic atrial fibrillation (HCC) . Acute on chronic respiratory failure with hypoxia (HCC) . Normocytic anemia . Leukocytosis  Consults:  Palliative medicine  Recommendations for Outpatient Follow-up:  1. Referral were made for home hospice, patient [facility] 2. Please repeat CBC/BMET at next visit 3. Patient was placed on low-dose metformin due to steroid-induced hyperglycemia, hemoglobin A1c 6.7. Please follow closely, metformin may need to be discontinued once off the steroids.   DIET: Heart healthy diet    Allergies:  No Known Allergies   DISCHARGE MEDICATIONS: Current Discharge Medication List    START taking these medications   Details  guaiFENesin (MUCINEX) 600 MG 12 hr tablet Take 1 tablet (600 mg total) by mouth 2 (two) times daily. Qty: 60 tablet, Refills: 0    ipratropium-albuterol (DUONEB) 0.5-2.5 (3) MG/3ML SOLN Take 3 mLs by nebulization 3 (three) times daily. Qty: 360 mL, Refills: 3    levofloxacin (LEVAQUIN) 500 MG tablet Take 1 tablet (500 mg total) by mouth daily. X 7days Qty: 7 tablet, Refills: 0    metFORMIN (GLUCOPHAGE) 500 MG tablet Take 1 tablet (500 mg total) by mouth daily with breakfast. Qty: 30 tablet, Refills: 3    oxyCODONE (OXY IR/ROXICODONE) 5 MG immediate release tablet Take 1 tablet (5 mg total) by mouth every 4 (four) hours as needed for moderate pain (dyspnea, SOB). Qty: 30 tablet, Refills: 0      CONTINUE these medications which have CHANGED   Details  apixaban (ELIQUIS) 5 MG TABS tablet Take 1 tablet (5 mg total) by mouth 2 (two) times daily. Qty: 60 tablet, Refills: 2    predniSONE (DELTASONE) 10 MG tablet  Prednisone dosing: Take  Prednisone  (4 tabs) x 3 days, then taper to  (3 tabs) x 3 days, then  (2 tabs) x 3days, then  (1 tab) x 3days, then OFF.  Dispense:  30 tabs, refills: None Qty: 30 tablet, Refills: 0      CONTINUE these medications which have NOT CHANGED   Details  albuterol (PROVENTIL) (2.5 MG/3ML) 0.083% nebulizer solution Take 3 mLs (2.5 mg total) by nebulization every 2 (two) hours as needed for wheezing or shortness of breath. Qty: 75 mL, Refills: 12    arformoterol (BROVANA) 15 MCG/2ML NEBU Take 2 mLs (15 mcg total) by nebulization 2 (two) times daily. Qty: 120 mL, Refills: 0    diltiazem (CARDIZEM CD) 180 MG 24 hr capsule Take 1 capsule (180 mg total) by mouth daily. Qty: 30 capsule, Refills: 0    Fluticasone-Salmeterol (ADVAIR) 500-50 MCG/DOSE AEPB Inhale 1 puff into the lungs 2 (two) times daily.    furosemide (LASIX) 40 MG tablet Take 1 tablet (40 mg total) by mouth daily. Qty: 30 tablet, Refills: 0    SPIRIVA HANDIHALER 18 MCG inhalation capsule Place 18 mcg into inhaler and inhale daily.  Refills: 2         Brief H and P: For complete details please refer to admission H and P, but in brief Per Dr. Antionette Wells on 2/2 Brenda Wells is a 75 y.o. female with PMH of COPD with 4 Lpm oxygen requirement, chronic atrial fibrillation on Eliquis, and chronic diastolic CHF presented to the ED with progressive dyspnea  and increased nonproductive cough. This is Brenda Wells fifth admission in 3 months, last discharged 10/04/2015 following treatment of COPD exacerbation. She described making a full recovery back to her baseline since the recent discharge, but during the afternoon of her presentation, she developed worsening of her chronic nonproductive cough and worsening dyspnea. She gave herself 2 nebulized breathing treatments at home but remained dyspneic at rest. She checked her home pulse oximeter and found her set to be 83%. She called her son for advice and he  activated EMS. Patient denied any recent fevers, chills, chest pain, or palpitations. There is no hemoptysis or leg pain. EMS found the patient to be saturating in the low 80s on 4 L/m.   In ED, patient was found to be afebrile, saturating presently on 4 L, with tachypnea and tachycardia. EKG demonstrated an atrial tachyarrhythmia and chest x-ray was negative for any acute process. Blood work returned with a negative troponin, low BNP, leukocytosis to 16,000, stable normocytic anemia, small bump in serum creatinine to 1.1, and hyperglycemia.  Hospital Course:  COPD exacerbation with acute on chronic hypoxic respiratory failure - improving today - This is 5th admit since November '16  - Patient was placed on scheduled nebs, IV Solu-Medrol and IV Levaquin. Continue Symbicort, Brenda Wells  - Patient was transitioned to oral prednisone with a taper and oral Levaquin at the time of discharge. - Palliative medicine was consulted and recommended OxyCodone IR for shortness of breath, pain. Home hospice was arranged for the patient  Chronic atrial fibrillation  - Appears to be in MAT on admission EKG  - Continue Eliquis and Cardizem - CHADS vasc 4   Chronic diastolic CHF  - TTE (07/22/15) EF 50-55%  - BNP 60 on admission and no edema on CXR  - Appeared to be clinically dry, hold home dose of Lasix, follow I's and O's and daily weights.  Normocytic anemia  - Hgb 10.3 on arrival, stable from prior measurements  - No sign of active blood loss   Hyperglycemia  - Likely steroid-induced as was on prednisone outpt  - Hemoglobin A1c was 6.7, patient was placed on low-dose metformin 500 mg daily as she is on prednisone with taper -Please follow closely, metformin may need to be discontinued once her blood sugars are improved and off steroids    Leukocytosis  - WBC 16,200 on admission  - Suspected secondary to steroid-induced demargination  - No fever or infectious sxs -  Treating with Levaquin as above     Day of Discharge BP 127/61 mmHg  Pulse 101  Temp(Src) 98 F (36.7 C) (Oral)  Resp 18  Ht  (1.651 m)  Wt 61.598 kg (135 lb 12.8 oz)  BMI 22.60 kg/m2  SpO2 97%  Physical Exam: General: Alert and awake oriented x3 not in any acute distress. HEENT: anicteric sclera, pupils reactive to light and accommodation CVS: S1-S2 clear no murmur rubs or gallops Chest: clear to auscultation bilaterally, no wheezing rales or rhonchi Abdomen: soft nontender, nondistended, normal bowel sounds Extremities: no cyanosis, clubbing or edema noted bilaterally Neuro: Cranial nerves II-XII intact, no focal neurological deficits   The results of significant diagnostics from this hospitalization (including imaging, microbiology, ancillary and laboratory) are listed below for reference.    LAB RESULTS: Basic Metabolic Panel:  Recent Labs Lab 10/20/15 2215 10/21/15 0417  NA 141 137  K 3.5 3.3*  CL 93* 92*  CO2 34* 35*  GLUCOSE 143* 174*  BUN 29* 25*  CREATININE 1.11* 0.89  CALCIUM 9.0 8.5*   Liver Function Tests:  Recent Labs Lab 10/21/15 0417  AST 15  ALT 15  ALKPHOS 57  BILITOT <0.1*  PROT 5.2*  ALBUMIN 2.8*   No results for input(s): LIPASE, AMYLASE in the last 168 hours. No results for input(s): AMMONIA in the last 168 hours. CBC:  Recent Labs Lab 10/20/15 2215 10/21/15 0417  WBC 16.2* 13.7*  NEUTROABS 11.6* 12.2*  HGB 10.3* 9.2*  HCT 34.2* 29.9*  MCV 83.4 82.6  PLT 256 231   Cardiac Enzymes: No results for input(s): CKTOTAL, CKMB, CKMBINDEX, TROPONINI in the last 168 hours. BNP: Invalid input(s): POCBNP CBG:  Recent Labs Lab 10/23/15 0618 10/23/15 1135  GLUCAP 169* 219*    Significant Diagnostic Studies:  Dg Chest 2 View  10/20/2015  CLINICAL DATA:  Cough and congestion EXAM: CHEST  2 VIEW COMPARISON:  10/01/2015 FINDINGS: The heart size appears mildly enlarged. Aortic atherosclerosis identified. There is no pleural  effusion or edema identified. The lungs are hyperinflated but clear. Coarsened interstitial markings suggestive of COPD noted. IMPRESSION: 1. Suspect emphysema 2. Mild cardiac enlargement and aortic atherosclerosis. Electronically Signed   By: Signa Kell M.D.   On: 10/20/2015 22:54    2D ECHO:   Disposition and Follow-up: Discharge Instructions    Diet Carb Modified    Complete by:  As directed      Discharge instructions    Complete by:  As directed   It is VERY IMPORTANT that you follow up with a PCP on a regular basis.  Check your blood glucoses before each meal and at bedtime and maintain a log of your readings.  Bring this log with you when you follow up with your PCP so that he or she can adjust your medications at your follow up visit.     Increase activity slowly    Complete by:  As directed             DISPOSITION:Home  DISCHARGE FOLLOW-UP Follow-up Information    Follow up with [facility].   Specialty:  Hospice Services   Why:  They will do your hospice care at your home   Contact information:   [address] [place] [address] [phone]       Follow up with [name],[name], MD. Schedule an appointment as soon as possible for a visit in 10 days.   Specialty:  Family Medicine   Why:  for hospital follow-up       Time spent on Discharge:    Signed:   Salaam Battershell M.D. [facility] 10/23/2015, 12:47 PM Pager: [phone]

## 2015-11-08 DIAGNOSIS — J440 Chronic obstructive pulmonary disease with acute lower respiratory infection: Secondary | ICD-10-CM | POA: Diagnosis not present

## 2015-11-17 DIAGNOSIS — J449 Chronic obstructive pulmonary disease, unspecified: Secondary | ICD-10-CM | POA: Diagnosis not present

## 2015-11-18 ENCOUNTER — Emergency Department (HOSPITAL_COMMUNITY): Payer: Medicare Other

## 2015-11-18 ENCOUNTER — Encounter (HOSPITAL_COMMUNITY): Payer: Self-pay | Admitting: Emergency Medicine

## 2015-11-18 ENCOUNTER — Inpatient Hospital Stay (HOSPITAL_COMMUNITY)
Admission: EM | Admit: 2015-11-18 | Discharge: 2015-11-25 | DRG: 199 | Disposition: A | Discharge status: Hospice | Payer: Medicare Other | Attending: Internal Medicine | Admitting: Internal Medicine

## 2015-11-18 DIAGNOSIS — I5032 Chronic diastolic (congestive) heart failure: Secondary | ICD-10-CM | POA: Diagnosis not present

## 2015-11-18 DIAGNOSIS — I110 Hypertensive heart disease with heart failure: Secondary | ICD-10-CM | POA: Diagnosis not present

## 2015-11-18 DIAGNOSIS — I482 Chronic atrial fibrillation, unspecified: Secondary | ICD-10-CM | POA: Diagnosis present

## 2015-11-18 DIAGNOSIS — J9620 Acute and chronic respiratory failure, unspecified whether with hypoxia or hypercapnia: Secondary | ICD-10-CM

## 2015-11-18 DIAGNOSIS — E872 Acidosis, unspecified: Secondary | ICD-10-CM | POA: Diagnosis present

## 2015-11-18 DIAGNOSIS — Z833 Family history of diabetes mellitus: Secondary | ICD-10-CM | POA: Diagnosis not present

## 2015-11-18 DIAGNOSIS — D72829 Elevated white blood cell count, unspecified: Secondary | ICD-10-CM | POA: Diagnosis not present

## 2015-11-18 DIAGNOSIS — Z7951 Long term (current) use of inhaled steroids: Secondary | ICD-10-CM | POA: Diagnosis not present

## 2015-11-18 DIAGNOSIS — J441 Chronic obstructive pulmonary disease with (acute) exacerbation: Secondary | ICD-10-CM | POA: Diagnosis present

## 2015-11-18 DIAGNOSIS — Z7984 Long term (current) use of oral hypoglycemic drugs: Secondary | ICD-10-CM

## 2015-11-18 DIAGNOSIS — Z9889 Other specified postprocedural states: Secondary | ICD-10-CM

## 2015-11-18 DIAGNOSIS — G8929 Other chronic pain: Secondary | ICD-10-CM | POA: Diagnosis present

## 2015-11-18 DIAGNOSIS — J9621 Acute and chronic respiratory failure with hypoxia: Secondary | ICD-10-CM | POA: Diagnosis present

## 2015-11-18 DIAGNOSIS — J9383 Other pneumothorax: Secondary | ICD-10-CM | POA: Diagnosis not present

## 2015-11-18 DIAGNOSIS — R05 Cough: Secondary | ICD-10-CM | POA: Diagnosis not present

## 2015-11-18 DIAGNOSIS — F17210 Nicotine dependence, cigarettes, uncomplicated: Secondary | ICD-10-CM | POA: Diagnosis present

## 2015-11-18 DIAGNOSIS — Z79891 Long term (current) use of opiate analgesic: Secondary | ICD-10-CM | POA: Diagnosis not present

## 2015-11-18 DIAGNOSIS — Z9981 Dependence on supplemental oxygen: Secondary | ICD-10-CM

## 2015-11-18 DIAGNOSIS — J9811 Atelectasis: Secondary | ICD-10-CM | POA: Diagnosis not present

## 2015-11-18 DIAGNOSIS — D649 Anemia, unspecified: Secondary | ICD-10-CM | POA: Diagnosis present

## 2015-11-18 DIAGNOSIS — D638 Anemia in other chronic diseases classified elsewhere: Secondary | ICD-10-CM | POA: Diagnosis present

## 2015-11-18 DIAGNOSIS — J939 Pneumothorax, unspecified: Secondary | ICD-10-CM | POA: Diagnosis not present

## 2015-11-18 DIAGNOSIS — T383X5A Adverse effect of insulin and oral hypoglycemic [antidiabetic] drugs, initial encounter: Secondary | ICD-10-CM | POA: Diagnosis not present

## 2015-11-18 DIAGNOSIS — R739 Hyperglycemia, unspecified: Secondary | ICD-10-CM

## 2015-11-18 DIAGNOSIS — Z66 Do not resuscitate: Secondary | ICD-10-CM | POA: Diagnosis not present

## 2015-11-18 DIAGNOSIS — E1165 Type 2 diabetes mellitus with hyperglycemia: Secondary | ICD-10-CM | POA: Diagnosis not present

## 2015-11-18 DIAGNOSIS — Z8249 Family history of ischemic heart disease and other diseases of the circulatory system: Secondary | ICD-10-CM

## 2015-11-18 DIAGNOSIS — Z8701 Personal history of pneumonia (recurrent): Secondary | ICD-10-CM

## 2015-11-18 DIAGNOSIS — Z7952 Long term (current) use of systemic steroids: Secondary | ICD-10-CM | POA: Diagnosis not present

## 2015-11-18 DIAGNOSIS — Z7901 Long term (current) use of anticoagulants: Secondary | ICD-10-CM | POA: Diagnosis not present

## 2015-11-18 DIAGNOSIS — T380X5A Adverse effect of glucocorticoids and synthetic analogues, initial encounter: Secondary | ICD-10-CM | POA: Diagnosis present

## 2015-11-18 DIAGNOSIS — Z4682 Encounter for fitting and adjustment of non-vascular catheter: Secondary | ICD-10-CM | POA: Diagnosis not present

## 2015-11-18 DIAGNOSIS — Z789 Other specified health status: Secondary | ICD-10-CM

## 2015-11-18 DIAGNOSIS — R0602 Shortness of breath: Secondary | ICD-10-CM | POA: Diagnosis present

## 2015-11-18 DIAGNOSIS — Z9689 Presence of other specified functional implants: Secondary | ICD-10-CM

## 2015-11-18 DIAGNOSIS — J9311 Primary spontaneous pneumothorax: Secondary | ICD-10-CM | POA: Diagnosis not present

## 2015-11-18 DIAGNOSIS — R069 Unspecified abnormalities of breathing: Secondary | ICD-10-CM | POA: Diagnosis not present

## 2015-11-18 DIAGNOSIS — J9690 Respiratory failure, unspecified, unspecified whether with hypoxia or hypercapnia: Secondary | ICD-10-CM | POA: Diagnosis not present

## 2015-11-18 DIAGNOSIS — J9622 Acute and chronic respiratory failure with hypercapnia: Secondary | ICD-10-CM | POA: Diagnosis not present

## 2015-11-18 LAB — CBC WITH DIFFERENTIAL/PLATELET
BASOS PCT: 0 %
Basophils Absolute: 0 10*3/uL (ref 0.0–0.1)
EOS ABS: 0 10*3/uL (ref 0.0–0.7)
Eosinophils Relative: 0 %
HEMATOCRIT: 31.7 % — AB (ref 36.0–46.0)
Hemoglobin: 9.2 g/dL — ABNORMAL LOW (ref 12.0–15.0)
Lymphocytes Relative: 7 %
Lymphs Abs: 1 10*3/uL (ref 0.7–4.0)
MCH: 26.1 pg (ref 26.0–34.0)
MCHC: 29 g/dL — ABNORMAL LOW (ref 30.0–36.0)
MCV: 89.8 fL (ref 78.0–100.0)
MONO ABS: 0.4 10*3/uL (ref 0.1–1.0)
Monocytes Relative: 3 %
NEUTROS ABS: 13 10*3/uL — AB (ref 1.7–7.7)
Neutrophils Relative %: 90 %
PLATELETS: 388 10*3/uL (ref 150–400)
RBC: 3.53 MIL/uL — ABNORMAL LOW (ref 3.87–5.11)
RDW: 22.5 % — AB (ref 11.5–15.5)
WBC: 14.4 10*3/uL — ABNORMAL HIGH (ref 4.0–10.5)

## 2015-11-18 LAB — COMPREHENSIVE METABOLIC PANEL
ALBUMIN: 3.1 g/dL — AB (ref 3.5–5.0)
ALK PHOS: 60 U/L (ref 38–126)
ALT: 19 U/L (ref 14–54)
AST: 23 U/L (ref 15–41)
Anion gap: 15 (ref 5–15)
BUN: 23 mg/dL — AB (ref 6–20)
CALCIUM: 8.8 mg/dL — AB (ref 8.9–10.3)
CO2: 30 mmol/L (ref 22–32)
CREATININE: 0.96 mg/dL (ref 0.44–1.00)
Chloride: 95 mmol/L — ABNORMAL LOW (ref 101–111)
GFR calc non Af Amer: 57 mL/min — ABNORMAL LOW (ref >60)
GLUCOSE: 185 mg/dL — AB (ref 65–99)
Potassium: 4.8 mmol/L (ref 3.5–5.1)
SODIUM: 140 mmol/L (ref 135–145)
Total Bilirubin: 0.2 mg/dL — ABNORMAL LOW (ref 0.3–1.2)
Total Protein: 5.6 g/dL — ABNORMAL LOW (ref 6.5–8.1)

## 2015-11-18 LAB — I-STAT TROPONIN, ED: Troponin i, poc: 0.01 ng/mL (ref 0.00–0.08)

## 2015-11-18 LAB — LACTIC ACID, PLASMA: LACTIC ACID, VENOUS: 2.4 mmol/L — AB (ref 0.5–2.0)

## 2015-11-18 LAB — I-STAT CG4 LACTIC ACID, ED: Lactic Acid, Venous: 3.54 mmol/L (ref 0.5–2.0)

## 2015-11-18 MED ORDER — MIDAZOLAM HCL 2 MG/2ML IJ SOLN
2.0000 mg | Freq: Once | INTRAMUSCULAR | Status: AC
  Administered 2015-11-18: 1 mg via INTRAVENOUS
  Filled 2015-11-18: qty 2

## 2015-11-18 MED ORDER — PANTOPRAZOLE SODIUM 40 MG PO TBEC
40.0000 mg | DELAYED_RELEASE_TABLET | Freq: Every day | ORAL | Status: DC
  Administered 2015-11-19 – 2015-11-25 (×7): 40 mg via ORAL
  Filled 2015-11-18 (×7): qty 1

## 2015-11-18 MED ORDER — METHYLPREDNISOLONE SODIUM SUCC 125 MG IJ SOLR
125.0000 mg | Freq: Once | INTRAMUSCULAR | Status: AC
  Administered 2015-11-18: 125 mg via INTRAVENOUS
  Filled 2015-11-18: qty 2

## 2015-11-18 MED ORDER — ALBUTEROL (5 MG/ML) CONTINUOUS INHALATION SOLN
10.0000 mg/h | INHALATION_SOLUTION | Freq: Once | RESPIRATORY_TRACT | Status: AC
  Administered 2015-11-18: 10 mg/h via RESPIRATORY_TRACT
  Filled 2015-11-18: qty 20

## 2015-11-18 MED ORDER — MIDAZOLAM HCL 10 MG/2ML IJ SOLN: 2.0000 mg | Freq: Once | INTRAMUSCULAR | Status: DC

## 2015-11-18 MED ORDER — SODIUM CHLORIDE 0.9 % IV BOLUS (SEPSIS)
500.0000 mL | Freq: Once | INTRAVENOUS | Status: AC
Start: 2015-11-18 — End: 2015-11-19
  Administered 2015-11-19: 500 mL via INTRAVENOUS

## 2015-11-18 MED ORDER — FENTANYL CITRATE (PF) 100 MCG/2ML IJ SOLN
12.5000 ug | INTRAMUSCULAR | Status: DC | PRN
  Administered 2015-11-18 – 2015-11-19 (×3): 12.5 ug via INTRAVENOUS
  Filled 2015-11-18 (×3): qty 2

## 2015-11-18 MED ORDER — FENTANYL CITRATE (PF) 100 MCG/2ML IJ SOLN
50.0000 ug | Freq: Once | INTRAMUSCULAR | Status: AC
  Administered 2015-11-18: 25 ug via INTRAVENOUS
  Filled 2015-11-18: qty 2

## 2015-11-18 MED ORDER — INSULIN ASPART 100 UNIT/ML ~~LOC~~ SOLN
0.0000 [IU] | Freq: Three times a day (TID) | SUBCUTANEOUS | Status: DC
  Administered 2015-11-19: 3 [IU] via SUBCUTANEOUS
  Administered 2015-11-19: 2 [IU] via SUBCUTANEOUS
  Administered 2015-11-19 – 2015-11-20 (×3): 3 [IU] via SUBCUTANEOUS
  Administered 2015-11-20 – 2015-11-21 (×2): 2 [IU] via SUBCUTANEOUS
  Administered 2015-11-21: 3 [IU] via SUBCUTANEOUS
  Administered 2015-11-21: 1 [IU] via SUBCUTANEOUS
  Administered 2015-11-22: 3 [IU] via SUBCUTANEOUS
  Administered 2015-11-22: 5 [IU] via SUBCUTANEOUS
  Administered 2015-11-22 – 2015-11-23 (×3): 3 [IU] via SUBCUTANEOUS
  Administered 2015-11-23 – 2015-11-24 (×2): 2 [IU] via SUBCUTANEOUS
  Administered 2015-11-24 (×2): 3 [IU] via SUBCUTANEOUS
  Administered 2015-11-25: 2 [IU] via SUBCUTANEOUS
  Administered 2015-11-25: 1 [IU] via SUBCUTANEOUS

## 2015-11-18 MED ORDER — GUAIFENESIN ER 600 MG PO TB12
600.0000 mg | ORAL_TABLET | Freq: Two times a day (BID) | ORAL | Status: DC
  Administered 2015-11-18 – 2015-11-25 (×14): 600 mg via ORAL
  Filled 2015-11-18 (×14): qty 1

## 2015-11-18 MED ORDER — IPRATROPIUM-ALBUTEROL 0.5-2.5 (3) MG/3ML IN SOLN
3.0000 mL | Freq: Four times a day (QID) | RESPIRATORY_TRACT | Status: DC
  Administered 2015-11-19 (×2): 3 mL via RESPIRATORY_TRACT
  Filled 2015-11-18 (×4): qty 3

## 2015-11-18 MED ORDER — SODIUM CHLORIDE 0.9 % IV BOLUS (SEPSIS)
500.0000 mL | Freq: Once | INTRAVENOUS | Status: AC
  Administered 2015-11-18: 500 mL via INTRAVENOUS

## 2015-11-18 MED ORDER — DILTIAZEM HCL ER COATED BEADS 180 MG PO CP24
180.0000 mg | ORAL_CAPSULE | Freq: Every day | ORAL | Status: DC
  Administered 2015-11-19: 180 mg via ORAL
  Filled 2015-11-18: qty 1

## 2015-11-18 MED ORDER — ONDANSETRON HCL 4 MG PO TABS: 4.0000 mg | ORAL_TABLET | Freq: Four times a day (QID) | ORAL | Status: DC | PRN

## 2015-11-18 MED ORDER — FUROSEMIDE 40 MG PO TABS
40.0000 mg | ORAL_TABLET | Freq: Every day | ORAL | Status: DC
  Administered 2015-11-19 – 2015-11-25 (×7): 40 mg via ORAL
  Filled 2015-11-18 (×7): qty 1

## 2015-11-18 MED ORDER — APIXABAN 5 MG PO TABS
5.0000 mg | ORAL_TABLET | Freq: Two times a day (BID) | ORAL | Status: DC
  Administered 2015-11-19 – 2015-11-25 (×13): 5 mg via ORAL
  Filled 2015-11-18 (×13): qty 1

## 2015-11-18 MED ORDER — SODIUM CHLORIDE 0.9% FLUSH: 3.0000 mL | INTRAVENOUS | Status: DC | PRN

## 2015-11-18 MED ORDER — SENNOSIDES-DOCUSATE SODIUM 8.6-50 MG PO TABS
2.0000 | ORAL_TABLET | Freq: Two times a day (BID) | ORAL | Status: DC
  Administered 2015-11-19 – 2015-11-25 (×11): 2 via ORAL
  Filled 2015-11-18 (×14): qty 2

## 2015-11-18 MED ORDER — METHYLPREDNISOLONE SODIUM SUCC 125 MG IJ SOLR
60.0000 mg | Freq: Four times a day (QID) | INTRAMUSCULAR | Status: DC
  Administered 2015-11-19 (×2): 60 mg via INTRAVENOUS
  Filled 2015-11-18 (×2): qty 2

## 2015-11-18 MED ORDER — LIDOCAINE HCL (PF) 2 % IJ SOLN
20.0000 mL | Freq: Once | INTRAMUSCULAR | Status: AC
  Administered 2015-11-18: 10 mL
  Filled 2015-11-18: qty 20

## 2015-11-18 MED ORDER — POLYETHYLENE GLYCOL 3350 17 G PO PACK: 17.0000 g | PACK | Freq: Every day | ORAL | Status: DC | PRN

## 2015-11-18 MED ORDER — ARFORMOTEROL TARTRATE 15 MCG/2ML IN NEBU
15.0000 ug | INHALATION_SOLUTION | Freq: Two times a day (BID) | RESPIRATORY_TRACT | Status: DC
  Administered 2015-11-19 – 2015-11-25 (×12): 15 ug via RESPIRATORY_TRACT
  Filled 2015-11-18 (×11): qty 2

## 2015-11-18 MED ORDER — IPRATROPIUM BROMIDE 0.02 % IN SOLN
0.5000 mg | Freq: Once | RESPIRATORY_TRACT | Status: AC
  Administered 2015-11-18: 0.5 mg via RESPIRATORY_TRACT
  Filled 2015-11-18: qty 2.5

## 2015-11-18 MED ORDER — MOMETASONE FURO-FORMOTEROL FUM 200-5 MCG/ACT IN AERO
2.0000 | INHALATION_SPRAY | Freq: Two times a day (BID) | RESPIRATORY_TRACT | Status: DC
  Administered 2015-11-19 – 2015-11-25 (×13): 2 via RESPIRATORY_TRACT
  Filled 2015-11-18: qty 8.8

## 2015-11-18 MED ORDER — TIOTROPIUM BROMIDE MONOHYDRATE 18 MCG IN CAPS
18.0000 ug | ORAL_CAPSULE | Freq: Every day | RESPIRATORY_TRACT | Status: DC
  Administered 2015-11-19 – 2015-11-25 (×7): 18 ug via RESPIRATORY_TRACT
  Filled 2015-11-18 (×2): qty 5

## 2015-11-18 MED ORDER — LORATADINE 10 MG PO TABS
10.0000 mg | ORAL_TABLET | Freq: Every day | ORAL | Status: DC
  Administered 2015-11-19 – 2015-11-25 (×7): 10 mg via ORAL
  Filled 2015-11-18 (×7): qty 1

## 2015-11-18 MED ORDER — OXYCODONE HCL 5 MG PO TABS
5.0000 mg | ORAL_TABLET | ORAL | Status: DC | PRN
  Administered 2015-11-19 – 2015-11-24 (×4): 5 mg via ORAL
  Filled 2015-11-18 (×4): qty 1

## 2015-11-18 MED ORDER — IPRATROPIUM-ALBUTEROL 0.5-2.5 (3) MG/3ML IN SOLN
3.0000 mL | RESPIRATORY_TRACT | Status: DC | PRN
  Administered 2015-11-19: 3 mL via RESPIRATORY_TRACT
  Filled 2015-11-18: qty 3

## 2015-11-18 MED ORDER — ONDANSETRON HCL 4 MG/2ML IJ SOLN: 4.0000 mg | Freq: Four times a day (QID) | INTRAMUSCULAR | Status: DC | PRN

## 2015-11-18 MED ORDER — SODIUM CHLORIDE 0.9 % IV SOLN: 250.0000 mL | INTRAVENOUS | Status: DC | PRN

## 2015-11-18 MED ORDER — SODIUM CHLORIDE 0.9% FLUSH
3.0000 mL | Freq: Two times a day (BID) | INTRAVENOUS | Status: DC
  Administered 2015-11-18 – 2015-11-25 (×13): 3 mL via INTRAVENOUS

## 2015-11-18 NOTE — ED Notes (Signed)
Per [facility] patient from home where she lives with her son. Patient states she is followed by hospice due to COPD.  Patient complains of difficulty breathing.  Patient 98% on 3L at this time and does not appear to be in distress at this time.

## 2015-11-18 NOTE — H&P (Signed)
[facility] History and Physical  Brenda Wells ZOX:[id number] DOB: 03/16/1941 DOA: 11/18/2015  Referring physician: ED physician PCP: [name], MD  Specialists: Dr. Delford Field (pulmonology)   Chief Complaint:  Acute dyspnea   HPI: Brenda Wells is a 75 y.o. female with PMH of COPD with 4 L/m supplemental oxygen requirement around the clock, chronic atrial fibrillation on request, and chronic diastolic CHF who presents to the ED with acute onset dyspnea at rest. Patient is chronically ill, receiving hospice services for advanced COPD, but was in her usual state while walking from her living room to the kitchen this evening when she experienced acute and severe dyspnea. There was no associated chest pain or palpitations. Brenda Wells sat down, hoping the sensation would pass, but when it failed to improve, she activated EMS for transport to the hospital. She had just been discharged from this facility less than 1 month ago. That was her fifth admission for COPD complications over a 3 month span. She had recovered to her baseline respiratory status prior to this evening's event. There have been no increased cough and no chest trauma.   In ED, patient was found to be afebrile, saturating well on 3 L/m nasal cannula, tachycardic to 120, and with stable blood pressure. Chest x-ray was obtained and demonstrates a 90% pneumothorax on the left side. Cardiothoracic surgery consultation was requested and Dr. Donata Clay evaluated the patient in the ED and placed a chest tube with successful reexpansion of the lung. Blood work obtained in the ED is notable for a leukocytosis to 14,400, hemoglobin of 9.2, and lactic acid of 3.54. Patient was given a 125 mg IV push of Solu-Medrol and nebulized breathing treatment in the emergency department, remained hemodynamically stable, and will be admitted for ongoing evaluation and management of spontaneous left-sided pneumothorax.  Where does patient live?   At home      Can patient participate in ADLs?  Yes        Review of Systems:   General: no fevers, chills, sweats, weight change, poor appetite, or fatigue HEENT: no blurry vision, hearing changes or sore throat Pulm:  Dyspnea at rest, non-productive cough, improved since arrival  CV: no chest pain or palpitations Abd: no nausea, vomiting, abdominal pain, diarrhea, or constipation GU: no dysuria, hematuria, increased urinary frequency, or urgency  Ext: no leg edema Neuro: no focal weakness, numbness, or tingling, no vision change or hearing loss Skin: no rash, no wounds MSK: No muscle spasm, no deformity, no red, hot, or swollen joint Heme: No easy bruising or bleeding Travel history: No recent long distant travel    Allergy: No Known Allergies  Past Medical History  Diagnosis Date  . Acute respiratory failure (HCC)   . Community acquired pneumonia   . COPD with acute exacerbation (HCC)   . Anemia   . Hypokalemia   . Hyponatremia   . CHF (congestive heart failure) (HCC)   . Hypertension   . Shortness of breath dyspnea   . History of kidney stones     Past Surgical History  Procedure Laterality Date  . Back surgery    . Cyst excision      base of tongue  . Cholecystectomy    . Abdominal hysterectomy      Social History:  reports that she has been smoking Cigarettes.  She has a 6 pack-year smoking history. She has never used smokeless tobacco. She reports that she does not drink alcohol or use illicit drugs.  Family  History:  Family History  Problem Relation Age of Onset  . Diabetes Mellitus II Mother   . CAD Mother   . CAD Brother      Prior to Admission medications   Medication Sig Start Date End Date Taking? Authorizing Provider  albuterol (PROVENTIL) (2.5 MG/3ML) 0.083% nebulizer solution Take 3 mLs (2.5 mg total) by nebulization every 2 (two) hours as needed for wheezing or shortness of breath. 09/07/15  Yes Richarda Overlie, MD  apixaban (ELIQUIS) 5 MG TABS tablet Take 1  tablet (5 mg total) by mouth 2 (two) times daily. 10/23/15  Yes Ripudeep Jenna Luo, MD  arformoterol (BROVANA) 15 MCG/2ML NEBU Take 2 mLs (15 mcg total) by nebulization 2 (two) times daily. 09/07/15  Yes Richarda Overlie, MD  diltiazem (CARDIZEM) 120 MG tablet Take 180 mg by mouth daily.   Yes Historical Provider, MD  Fluticasone-Salmeterol (ADVAIR) 500-50 MCG/DOSE AEPB Inhale 1 puff into the lungs 2 (two) times daily.   Yes Historical Provider, MD  furosemide (LASIX) 40 MG tablet Take 1 tablet (40 mg total) by mouth daily. 10/04/15  Yes Maryann Mikhail, DO  guaiFENesin (MUCINEX) 600 MG 12 hr tablet Take 1 tablet (600 mg total) by mouth 2 (two) times daily. 10/23/15  Yes Ripudeep K Rai, MD  ipratropium-albuterol (DUONEB) 0.5-2.5 (3) MG/3ML SOLN Take 3 mLs by nebulization 3 (three) times daily. 10/23/15  Yes Ripudeep Jenna Luo, MD  loratadine (CLARITIN) 10 MG tablet Take 10 mg by mouth daily.   Yes Historical Provider, MD  metFORMIN (GLUCOPHAGE) 500 MG tablet Take 1 tablet (500 mg total) by mouth daily with breakfast. 10/23/15  Yes Ripudeep K Rai, MD  omeprazole (PRILOSEC) 20 MG capsule Take 20 mg by mouth daily.   Yes Historical Provider, MD  ondansetron (ZOFRAN) 4 MG tablet Take 4 mg by mouth every 6 (six) hours as needed for nausea or vomiting.  11/14/15  Yes Historical Provider, MD  predniSONE (DELTASONE) 10 MG tablet Prednisone dosing: Take  Prednisone  (4 tabs) x 3 days, then taper to  (3 tabs) x 3 days, then  (2 tabs) x 3days, then  (1 tab) x 3days, then OFF.  Dispense:  30 tabs, refills: None Patient taking differently: Take 10 mg by mouth daily with breakfast.  10/23/15  Yes Ripudeep Jenna Luo, MD  sennosides-docusate sodium (SENOKOT-S) 8.6-50 MG tablet Take 2 tablets by mouth 2 (two) times daily.   Yes Historical Provider, MD  SPIRIVA HANDIHALER 18 MCG inhalation capsule Place 18 mcg into inhaler and inhale daily.  08/24/15  Yes Historical Provider, MD  diltiazem (CARDIZEM CD) 180 MG 24 hr capsule Take  1 capsule (180 mg total) by mouth daily. Patient not taking: Reported on 11/18/2015 09/07/15   Richarda Overlie, MD  levofloxacin (LEVAQUIN) 500 MG tablet Take 1 tablet (500 mg total) by mouth daily. X 7days Patient not taking: Reported on 11/18/2015 10/23/15   Ripudeep Jenna Luo, MD  oxyCODONE (OXY IR/ROXICODONE) 5 MG immediate release tablet Take 1 tablet (5 mg total) by mouth every 4 (four) hours as needed for moderate pain (dyspnea, SOB). Patient not taking: Reported on 11/18/2015 10/23/15   Ripudeep Jenna Luo, MD    Physical Exam: Filed Vitals:   11/18/15 2030 11/18/15 2034 11/18/15 2045 11/18/15 2148  BP: 127/74  135/95 132/95  Pulse: 103   130  Temp:  98 F (36.7 C)  98.2 F (36.8 C)  TempSrc:  Oral  Oral  Resp: Height:  5\' 5"  (1.651 m)  Weight:    61.372 kg (135 lb 4.8 oz)  SpO2: 97%   97%   General:  In acute respiratory distress with tachypnea and accessory muscle use  HEENT:       Eyes: PERRL, EOMI, no scleral icterus or conjunctival pallor.       ENT: No discharge from the ears or nose, no pharyngeal ulcers, petechiae or exudate, no tonsillar enlargement.        Neck: No JVD, no bruit, no appreciable mass Heme: No cervical adenopathy, no pallor Cardiac:  Rate ~100 and regular with soft systolic murmur at lower LSB, No gallops or rubs. Pulm: Diminished bilaterally, wheezes throughout   Abd: Soft, nondistended, nontender, no rebound pain or gaurding, no mass or organomegaly, BS present. Ext:  Trace LE edema bilaterally. 2+DP/PT pulse bilaterally. Musculoskeletal: No gross deformity, no red, hot, swollen joints, no limitation in ROM  Skin: No rashes or wounds on exposed surfaces  Neuro: Alert, oriented X3, cranial nerves II-XII grossly intact. No focal findings Psych: Patient is not overtly psychotic, appropriate mood and affect.   Labs on Admission:  Basic Metabolic Panel:  Recent Labs Lab 11/18/15 1827  NA 140  K 4.8  CL 95*  CO2 30  GLUCOSE 185*  BUN 23*   CREATININE 0.96  CALCIUM 8.8*   Liver Function Tests:  Recent Labs Lab 11/18/15 1827  AST 23  ALT 19  ALKPHOS 60  BILITOT 0.2*  PROT 5.6*  ALBUMIN 3.1*   No results for input(s): LIPASE, AMYLASE in the last 168 hours. No results for input(s): AMMONIA in the last 168 hours. CBC:  Recent Labs Lab 11/18/15 1827  WBC 14.4*  NEUTROABS 13.0*  HGB 9.2*  HCT 31.7*  MCV 89.8  PLT 388   Cardiac Enzymes: No results for input(s): CKTOTAL, CKMB, CKMBINDEX, TROPONINI in the last 168 hours.  BNP (last 3 results)  Recent Labs  09/02/15 0124 09/09/15 1350 10/20/15 2215  BNP 64.9 102.1* 60.1    ProBNP (last 3 results) No results for input(s): PROBNP in the last 8760 hours.  CBG: No results for input(s): GLUCAP in the last 168 hours.  Radiological Exams on Admission: Dg Chest Port 1 View  11/18/2015  CLINICAL DATA:  75 year old female status post placement of a left-sided chest tube. EXAM: PORTABLE CHEST 1 VIEW COMPARISON:  Radiograph dated 11/18/2015 FINDINGS: There has been interval placement of a left-sided chest tube the tip over the left apical pleural surface. There has been interval re-expansion of the left lung. No definite pneumothorax identified on the current study. There is a focal area of opacity in the left upper lung field abutting the aortic arch compatible with an area of atelectasis. The right lung is clear. There is coarsened interstitial markings which may represent underlying COPD. There is no pleural effusion. Mild cardiac enlargement. No acute osseous pathology. IMPRESSION: Interval placement of a left-sided chest tube with insert interval re-expansion of the left lung. No definite residual pneumothorax identified. Electronically Signed   By: Elgie Collard M.D.   On: 11/18/2015 19:41   Dg Chest Portable 1 View  11/18/2015  CLINICAL DATA:  Dyspnea today. Pt c/o not being able to catch her breath at all. Hx of CHF, HTN, COPD with acute exacerbation, acute  respiratory failure. EXAM: PORTABLE CHEST 1 VIEW COMPARISON:  10/20/2015 FINDINGS: Large left pneumothorax.  There is no mediastinal shift. Right lung is clear. Cardiac silhouette is mildly enlarged. No mediastinal or hilar masses. IMPRESSION:  1. Large left pneumothorax. Critical Value/emergent results were called by telephone at the time of interpretation on 11/18/2015 at 6:12 pm to Dr. Pricilla Loveless , who verbally acknowledged these results. Electronically Signed   By: Amie Portland M.D.   On: 11/18/2015 18:12    EKG: Independently reviewed.  Abnormal findings:  Sinus tachycardia (rate 122), low-voltage QRS, poor R-wave progression   Assessment/Plan  1. Spontaneous left-sided pneumothorax - Likely secondary to bleb rupture  - Successfully re-expanded with chest tube placement in ED by Dr. Donata Clay - Chest tube placed to water seal, management per CTS    2. COPD with acute exacerbation  - Improved since placement of CT, but still dyspneic beyond baseline  - Solu-Medrol 125 mg IVP given in ED, will continue systemic steroids with Solu-Medrol 60 mg IV q6h  - DuoNeb q2h prn SOB or wheezing  - Continue home ICS/LABA with Dulera (formulary replacement for Advair), Spiriva - Continuous pulse oximetry with titration of FiO2 to maintain sat >92%    3. Chronic atrial fibrillation  - Appears to be sinus tachycardia currently  - Continue Eliquis, CHADS-VASc 4   - Monitor on telemetry  - Continue home-dose Cardizem   4. Chronic diastolic CHF  - Appears slightly volume-up  - SLIV, strict I/Os, fluid-restrict diet, daily wts  - Continue home-dose Lasix   5.  Anemia  - Secondary to chronic disease  - Hgb 9.2 on arrival, consistent with her baseline  - No sign of active blood loss  - Monitor    6. Leukocytosis  - Suspected to be steroid-induced  - No other suggestion of infectious process identified  - Monitoring    7. Hyperglycemia  - Likely steroid-induced  - Taking metformin at home,  will hold that here and institute a low-intensity SSI correctional  - CBG with meals and qHS    DVT ppx: Continue Eliquis  Code Status: Full code Family Communication: None at bed side.               Disposition Plan: Admit to inpatient   Date of Service 11/18/2015    Briscoe Deutscher, MD [facility] Pager [phone]  If 7PM-7AM, please contact night-coverage [email or web address] Password [facility] 11/18/2015, 10:29 PM

## 2015-11-18 NOTE — ED Notes (Signed)
Pt placed on bi-pap by RT; appears comfortable at this time; reports improvement of pain with medications

## 2015-11-18 NOTE — ED Provider Notes (Signed)
CSN: [id number]     Arrival date & time 11/18/15  1713 History   First MD Initiated Contact with Patient 11/18/15 1737     Chief Complaint  Patient presents with  . Shortness of Breath     (Consider location/radiation/quality/duration/timing/severity/associated sxs/prior Treatment) HPI  75 year old female with a history of COPD on chronic home oxygen as well as CHF presents with acute shortness of breath. She typically goes around in a wheelchair and while she was coming back from the kitchen she noticed acute shortness of breath. Feels somewhat similar to prior COPD exacerbations. Has noticed trace feet swelling but no significant leg edema. No chest pain. Has been having a cough worse than normal for about one week. No fevers. Has tried 3 of year-old treatments with minimal relief. Patient also has home hospice and tried in nebulized morphine with no significant relief either. Has chronic pain but no new pain such as abdominal pain, abdominal swelling, or chest pain.  Past Medical History  Diagnosis Date  . Acute respiratory failure (HCC)   . Community acquired pneumonia   . COPD with acute exacerbation (HCC)   . Anemia   . Hypokalemia   . Hyponatremia   . CHF (congestive heart failure) (HCC)   . Hypertension   . Shortness of breath dyspnea   . History of kidney stones    Past Surgical History  Procedure Laterality Date  . Back surgery    . Cyst excision      base of tongue  . Cholecystectomy    . Abdominal hysterectomy     Family History  Problem Relation Age of Onset  . Diabetes Mellitus II Mother   . CAD Mother   . CAD Brother    Social History  Substance Use Topics  . Smoking status: Current Every Day Smoker -- 0.10 packs/day for 60 years    Types: Cigarettes  . Smokeless tobacco: Never Used     Comment: I smoke 1 cigarette a day "  . Alcohol Use: No   OB History    No data available     Review of Systems  Constitutional: Negative for fever.  Respiratory:  Positive for cough and shortness of breath.   Cardiovascular: Positive for leg swelling. Negative for chest pain.  All other systems reviewed and are negative.     Allergies  Review of patient's allergies indicates no known allergies.  Home Medications   Prior to Admission medications   Medication Sig Start Date End Date Taking? Authorizing Provider  albuterol (PROVENTIL) (2.5 MG/3ML) 0.083% nebulizer solution Take 3 mLs (2.5 mg total) by nebulization every 2 (two) hours as needed for wheezing or shortness of breath. 09/07/15   Richarda Overlie, MD  apixaban (ELIQUIS) 5 MG TABS tablet Take 1 tablet (5 mg total) by mouth 2 (two) times daily. 10/23/15   Ripudeep Jenna Luo, MD  arformoterol (BROVANA) 15 MCG/2ML NEBU Take 2 mLs (15 mcg total) by nebulization 2 (two) times daily. 09/07/15   Richarda Overlie, MD  diltiazem (CARDIZEM CD) 180 MG 24 hr capsule Take 1 capsule (180 mg total) by mouth daily. 09/07/15   Richarda Overlie, MD  Fluticasone-Salmeterol (ADVAIR) 500-50 MCG/DOSE AEPB Inhale 1 puff into the lungs 2 (two) times daily.    Historical Provider, MD  furosemide (LASIX) 40 MG tablet Take 1 tablet (40 mg total) by mouth daily. 10/04/15   Maryann Mikhail, DO  guaiFENesin (MUCINEX) 600 MG 12 hr tablet Take 1 tablet (600 mg total) by mouth 2 (  two) times daily. 10/23/15   Ripudeep Jenna Luo, MD  ipratropium-albuterol (DUONEB) 0.5-2.5 (3) MG/3ML SOLN Take 3 mLs by nebulization 3 (three) times daily. 10/23/15   Ripudeep Jenna Luo, MD  levofloxacin (LEVAQUIN) 500 MG tablet Take 1 tablet (500 mg total) by mouth daily. X 7days 10/23/15   Ripudeep Jenna Luo, MD  metFORMIN (GLUCOPHAGE) 500 MG tablet Take 1 tablet (500 mg total) by mouth daily with breakfast. 10/23/15   Ripudeep Jenna Luo, MD  oxyCODONE (OXY IR/ROXICODONE) 5 MG immediate release tablet Take 1 tablet (5 mg total) by mouth every 4 (four) hours as needed for moderate pain (dyspnea, SOB). 10/23/15   Ripudeep Jenna Luo, MD  predniSONE (DELTASONE) 10 MG tablet Prednisone dosing: Take   Prednisone  (4 tabs) x 3 days, then taper to  (3 tabs) x 3 days, then  (2 tabs) x 3days, then  (1 tab) x 3days, then OFF.  Dispense:  30 tabs, refills: None 10/23/15   Ripudeep Jenna Luo, MD  SPIRIVA HANDIHALER 18 MCG inhalation capsule Place 18 mcg into inhaler and inhale daily.  08/24/15   Historical Provider, MD   BP 125/72 mmHg  Pulse 58  Resp 22  SpO2 97% Physical Exam  Constitutional: She is oriented to person, place, and time. She appears well-developed and well-nourished.  HENT:  Head: Normocephalic and atraumatic.  Right Ear: External ear normal.  Left Ear: External ear normal.  Nose: Nose normal.  Eyes: Right eye exhibits no discharge. Left eye exhibits no discharge.  Cardiovascular: Normal heart sounds.  An irregular rhythm present. Tachycardia present.   Pulmonary/Chest: Tachypnea noted. No respiratory distress. She has decreased breath sounds. She has wheezes.  RR in low 30s Diffuse decreased breath sounds Minimal expiratory wheezes  Abdominal: Soft. There is no tenderness.  Musculoskeletal: She exhibits edema (trace pedal edema, L>R).  Neurological: She is alert and oriented to person, place, and time.  Skin: Skin is warm and dry.  Nursing note and vitals reviewed.   ED Course  Procedures (including critical care time) Labs Review Labs Reviewed  I-STAT CG4 LACTIC ACID, ED - Abnormal; Notable for the following:    Lactic Acid, Venous 3.54 (*)    All other components within normal limits  COMPREHENSIVE METABOLIC PANEL  CBC WITH DIFFERENTIAL/PLATELET  BRAIN NATRIURETIC PEPTIDE  I-STAT TROPOININ, ED    Imaging Review Dg Chest Portable 1 View  11/18/2015  CLINICAL DATA:  Dyspnea today. Pt c/o not being able to catch her breath at all. Hx of CHF, HTN, COPD with acute exacerbation, acute respiratory failure. EXAM: PORTABLE CHEST 1 VIEW COMPARISON:  10/20/2015 FINDINGS: Large left pneumothorax.  There is no mediastinal shift. Right lung is clear. Cardiac  silhouette is mildly enlarged. No mediastinal or hilar masses. IMPRESSION: 1. Large left pneumothorax. Critical Value/emergent results were called by telephone at the time of interpretation on 11/18/2015 at 6:12 pm to Dr. Pricilla Loveless , who verbally acknowledged these results. Electronically Signed   By: Amie Portland M.D.   On: 11/18/2015 18:12   I have personally reviewed and evaluated these images and lab results as part of my medical decision-making.   EKG Interpretation   Date/Time:  Thursday November 18 2015 17:35:10 EST Ventricular Rate:  122 PR Interval:  158 QRS Duration: 73 QT Interval:  290 QTC Calculation: 413 R Axis:   73 Text Interpretation:  Sinus tachycardia Multiform ventricular premature  complexes Low voltage, extremity leads Abnormal R-wave progression, early  transition Borderline repolarization abnormality Confirmed by Jaterrius Ricketson  MD, Shiraz Bastyr [phone]) on 11/18/2015 6:03:22 PM      MDM   Final diagnoses:  Acute on chronic respiratory failure, unspecified whether with hypoxia or hypercapnia (HCC)  Pneumothorax on left    Patient appears to have a large pneumothorax on the left. Likely the acute worsening of her pain was from popping a bleb. She is not hypoxic on her baseline oxygen. She seemed to have some improvement with albuterol. Initially it was unclear that she would have a pneumothorax and so she was put on BiPAP as this is upper in the past. When pneumothorax was seen she was taken off of this (only on it for a couple minutes). CT surgery was consulted and has placed a chest tube with good relief of the pneumothorax. Patient to be admitted to the hospitalist.    Pricilla Loveless, MD 11/18/15 2359

## 2015-11-18 NOTE — Progress Notes (Signed)
CT Surgery  20 F chest tube placed for large spontaneous pntx with respiratory distress Large expression of air with tube placement Improved breathsounds post tube placement CXR pending  OK to continue Eliquis with chest tube Will follow patients chest tube on [facility]  Kerin Perna, MD TCTS

## 2015-11-18 NOTE — ED Notes (Signed)
Pt taken off of Bi-pap per Dr.Goldston; placed pt on 3L [place]

## 2015-11-18 NOTE — Progress Notes (Signed)
Per Dr Criss Alvine, pt off bipap at this time due to Left Pneumothorax on CXR. Pt will continue with  Albuterol/0.5 mg Atrovent Continuous nebulizer. Pt could benefit from bipap in the future if Pneumo is resolved. RT will continue to monitor.

## 2015-11-18 NOTE — Op Note (Signed)
[name], Brenda Wells NO.:  [id number]  MEDICAL RECORD NO.:  [id number]  LOCATION:  A04C                         FACILITY:  [facility]  PHYSICIAN:  Kerin Perna, M.D.  DATE OF BIRTH:  09-18-1941  DATE OF PROCEDURE:  11/18/2015 DATE OF DISCHARGE:                              OPERATIVE REPORT   OPERATION:  Placement of left chest tube.  PREPROCEDURE DIAGNOSIS:  90% left pneumothorax, respiratory distress.  POSTOPERATIVE DIAGNOSIS:  90% left pneumothorax, respiratory distress.  SURGEON:  Kerin Perna, MD  ANESTHESIA:  Local with 1% lidocaine with IV monitored conscious sedation.  CLINICAL NOTE:  The patient is a 75 year old, chronically ill female with recent admission for COPD flare-up.  Earlier today, she had sudden onset of shortness of breath without chest pain.  She waited until she was sure that it would not resolve before she presented to the emergency department.  Her oxygen saturation was low and she was given oxygen and a chest x-ray showed a large 90% left pneumothorax.  Thoracic surgical evaluation was requested.  I examined the patient in her emergency department room, reviewed her x-ray and recommended left chest tube placement.  Informed consent was obtained prior to tube placement.  OPERATIVE PROCEDURE:  The left chest was prepped and draped as a sterile field.  A proper time-out was performed.  A 1% local lidocaine was infiltrated in the 5th interspace in the anterior axillary line. Further lidocaine was infiltrated down to the intercostal muscle.  A small incision was made and a skin suture was placed.  A hemostat was passed into the left pleural space and a rush of air exited through the incision.  A 20-French chest tube was then directed anteriorly into the apex, secured to the skin, connected to a Pleur-Evac underwater seal drainage system, and covered with a sterile dressing.  Followup chest x-ray showed the chest tube to be in good  position with re-expansion of the lung without residual pneumothorax.  The patient will be admitted to the [facility] for care of her multiple medical problems, and I will provide care for her chest tube management of her spontaneous left pneumothorax.     Kerin Perna, M.D.     PV/MEDQ  D:  11/18/2015  T:  11/18/2015  Job:  [id number]

## 2015-11-19 ENCOUNTER — Inpatient Hospital Stay (HOSPITAL_COMMUNITY): Payer: Medicare Other

## 2015-11-19 DIAGNOSIS — E872 Acidosis, unspecified: Secondary | ICD-10-CM | POA: Diagnosis present

## 2015-11-19 DIAGNOSIS — J9311 Primary spontaneous pneumothorax: Secondary | ICD-10-CM

## 2015-11-19 DIAGNOSIS — J441 Chronic obstructive pulmonary disease with (acute) exacerbation: Secondary | ICD-10-CM

## 2015-11-19 DIAGNOSIS — I482 Chronic atrial fibrillation: Secondary | ICD-10-CM

## 2015-11-19 LAB — BASIC METABOLIC PANEL
ANION GAP: 14 (ref 5–15)
BUN: 22 mg/dL — ABNORMAL HIGH (ref 6–20)
CALCIUM: 8.3 mg/dL — AB (ref 8.9–10.3)
CO2: 28 mmol/L (ref 22–32)
Chloride: 94 mmol/L — ABNORMAL LOW (ref 101–111)
Creatinine, Ser: 0.88 mg/dL (ref 0.44–1.00)
GFR calc Af Amer: >60 mL/min (ref >60)
GFR calc non Af Amer: >60 mL/min (ref >60)
GLUCOSE: 260 mg/dL — AB (ref 65–99)
Potassium: 4.8 mmol/L (ref 3.5–5.1)
Sodium: 136 mmol/L (ref 135–145)

## 2015-11-19 LAB — GLUCOSE, CAPILLARY
GLUCOSE-CAPILLARY: 218 mg/dL — AB (ref 65–99)
Glucose-Capillary: 179 mg/dL — ABNORMAL HIGH (ref 65–99)
Glucose-Capillary: 205 mg/dL — ABNORMAL HIGH (ref 65–99)
Glucose-Capillary: 183 mg/dL — ABNORMAL HIGH (ref 65–99)

## 2015-11-19 LAB — CBC WITH DIFFERENTIAL/PLATELET
BASOS ABS: 0 10*3/uL (ref 0.0–0.1)
BASOS PCT: 0 %
EOS ABS: 0 10*3/uL (ref 0.0–0.7)
Eosinophils Relative: 0 %
HCT: 28.7 % — ABNORMAL LOW (ref 36.0–46.0)
HEMOGLOBIN: 8.4 g/dL — AB (ref 12.0–15.0)
LYMPHS ABS: 0.4 10*3/uL — AB (ref 0.7–4.0)
LYMPHS PCT: 2 %
MCH: 25.9 pg — AB (ref 26.0–34.0)
MCHC: 29.3 g/dL — AB (ref 30.0–36.0)
MCV: 88.6 fL (ref 78.0–100.0)
MONOS PCT: 1 %
Monocytes Absolute: 0.2 10*3/uL (ref 0.1–1.0)
NEUTROS ABS: 20.1 10*3/uL — AB (ref 1.7–7.7)
Neutrophils Relative %: 97 %
Platelets: 375 10*3/uL (ref 150–400)
RBC: 3.24 MIL/uL — ABNORMAL LOW (ref 3.87–5.11)
RDW: 22.1 % — ABNORMAL HIGH (ref 11.5–15.5)
WBC: 20.7 10*3/uL — ABNORMAL HIGH (ref 4.0–10.5)

## 2015-11-19 LAB — MRSA PCR SCREENING: MRSA by PCR: NEGATIVE

## 2015-11-19 LAB — LACTIC ACID, PLASMA
LACTIC ACID, VENOUS: 3.7 mmol/L — AB (ref 0.5–2.0)
Lactic Acid, Venous: 4.8 mmol/L (ref 0.5–2.0)

## 2015-11-19 LAB — BRAIN NATRIURETIC PEPTIDE: B Natriuretic Peptide: 90 pg/mL (ref 0.0–100.0)

## 2015-11-19 MED ORDER — GLUCERNA SHAKE PO LIQD
237.0000 mL | Freq: Three times a day (TID) | ORAL | Status: DC
  Administered 2015-11-19 – 2015-11-25 (×20): 237 mL via ORAL

## 2015-11-19 MED ORDER — PREDNISONE 20 MG PO TABS
40.0000 mg | ORAL_TABLET | Freq: Two times a day (BID) | ORAL | Status: DC
  Administered 2015-11-19 – 2015-11-21 (×4): 40 mg via ORAL
  Filled 2015-11-19 (×4): qty 2

## 2015-11-19 MED ORDER — DILTIAZEM HCL ER COATED BEADS 240 MG PO CP24
240.0000 mg | ORAL_CAPSULE | Freq: Every day | ORAL | Status: DC
  Administered 2015-11-20 – 2015-11-21 (×2): 240 mg via ORAL
  Filled 2015-11-19 (×2): qty 1

## 2015-11-19 MED ORDER — SODIUM CHLORIDE 0.9 % IV BOLUS (SEPSIS)
500.0000 mL | Freq: Once | INTRAVENOUS | Status: AC
  Administered 2015-11-19: 500 mL via INTRAVENOUS

## 2015-11-19 MED ORDER — MORPHINE SULFATE (PF) 2 MG/ML IV SOLN
2.0000 mg | INTRAVENOUS | Status: DC | PRN
  Administered 2015-11-21 – 2015-11-23 (×2): 2 mg via INTRAVENOUS
  Filled 2015-11-19 (×2): qty 1

## 2015-11-19 MED ORDER — DILTIAZEM HCL 30 MG PO TABS
30.0000 mg | ORAL_TABLET | Freq: Four times a day (QID) | ORAL | Status: AC
  Administered 2015-11-19 (×2): 30 mg via ORAL
  Filled 2015-11-19 (×2): qty 1

## 2015-11-19 MED ORDER — SODIUM CHLORIDE 0.9 % IV BOLUS (SEPSIS)
250.0000 mL | Freq: Once | INTRAVENOUS | Status: AC
Start: 2015-11-19 — End: 2015-11-19
  Administered 2015-11-19: 250 mL via INTRAVENOUS

## 2015-11-19 NOTE — Progress Notes (Signed)
  Subjective: Large left spontaneous pneumothorax-chest tube placed March 2 Severe COPD on home oxygen Chest x-ray today reviewed showing full reexpansion as of lung  Objective: Vital signs in last 24 hours: Temp:  [97.9 F (36.6 C)-98.2 F (36.8 C)] 97.9 F (36.6 C) (03/03 1417) Pulse Rate:  [58-133] 133 (03/03 1417) Cardiac Rhythm:  [-] Sinus tachycardia (03/03 0700) Resp:  [22-34] 22 (03/03 1417) BP: (100-135)/(57-95) 126/77 mmHg (03/03 1417) SpO2:  [95 %-100 %] 100 % (03/03 1417) FiO2 (%):  [30 %] 30 % (03/02 1804) Weight:  [135 lb 4.8 oz (61.372 kg)-135 lb 8 oz (61.462 kg)] 135 lb 8 oz (61.462 kg) (03/03 0431)  Hemodynamic parameters for last 24 hours:  stable  Intake/Output from previous day: 03/02 0701 - 03/03 0700 In: -  Out: 800 [Urine:800] Intake/Output this shift: Total I/O In: 600 [P.O.:600] Out: 1850 [Urine:1850]  No air leak noted from Pleur-evac  Lab Results:  Recent Labs  11/18/15 1827 11/19/15 0058  WBC 14.4* 20.7*  HGB 9.2* 8.4*  HCT 31.7* 28.7*  PLT 388 375   BMET:  Recent Labs  11/18/15 1827 11/19/15 0058  NA 140 136  K 4.8 4.8  CL 95* 94*  CO2 30 28  GLUCOSE 185* 260*  BUN 23* 22*  CREATININE 0.96 0.88  CALCIUM 8.8* 8.3*    PT/INR: No results for input(s): LABPROT, INR in the last 72 hours. ABG    Component Value Date/Time   PHART 7.457* 10/20/2015 2348   HCO3 38.5* 10/20/2015 2348   TCO2 40 10/20/2015 2348   O2SAT 99.0 10/20/2015 2348   CBG (last 3)   Recent Labs  11/19/15 0625 11/19/15 1122 11/19/15 1552  GLUCAP 179* 218* 205*    Assessment/Plan: S/P   Continue chest tube to suction since the patient had a significant pneumothorax on presentation Chest x-ray ordered for a.m.  LOS: 1 day    Brenda Wells 11/19/2015

## 2015-11-19 NOTE — Progress Notes (Signed)
Critical lab: Lactic acid 3.7

## 2015-11-19 NOTE — Progress Notes (Signed)
Critical Lab: Lactic Acid 4.8

## 2015-11-19 NOTE — Progress Notes (Signed)
[facility] PROGRESS NOTE  Brenda Wells MVH:[id number] DOB: June 11, 1941 DOA: 11/18/2015 PCP: [name], MD  Assessment/Plan:  Principal Problem:   Pneumothorax on left s/p CT. Management per TCTS. Adjust pain regimen Active Problems:   COPD exacerbation (HCC): no wheeze now. Change to po steroid   Chronic atrial fibrillation (HCC) with periods of rvr with exertion. Will increase cardizem as bp tolerates   Chronic anticoagulation - Coumadin, CHADS2VASC=4   Chronic diastolic (congestive) heart failure (HCC)   Steroid-induced hyperglycemia   DNR (do not resuscitate): hospice at home   Normocytic anemia   Leukocytosis   Acute on chronic respiratory failure (HCC)   Lactic acidosis: no evidence of infection. Got ivf, but want to avoid chf exac. Could be from metformin. Would not resume.   Code Status:  dnr Family Communication:  None avail Disposition Plan:  Home eventually   HPI/Subjective: C/o severe doe. Pain uncontrolled.  Objective: Filed Vitals:   11/19/15 0431 11/19/15 1000  BP: 118/61 110/60  Pulse: 118   Temp: 97.9 F (36.6 C)   Resp: 23     Intake/Output Summary (Last 24 hours) at 11/19/15 1229 Last data filed at 11/19/15 1021  Gross per 24 hour  Intake    240 ml  Output   2000 ml  Net  -1760 ml   Filed Weights   11/18/15 2148 11/19/15 0431  Weight: 61.372 kg (135 lb 4.8 oz) 61.462 kg (135 lb 8 oz)   Tele a fib rate 100. Above 150 with exertion Exam:   General:  Comfortable at rest. Very winded with repositioning in bed  Cardiovascular: irreg irreg  Respiratory: CTA without WRR  Abdomen: s, nt, nd  Ext: no CCE  Basic Metabolic Panel:  Recent Labs Lab 11/18/15 1827 11/19/15 0058  NA 140 136  K 4.8 4.8  CL 95* 94*  CO2 30 28  GLUCOSE 185* 260*  BUN 23* 22*  CREATININE 0.96 0.88  CALCIUM 8.8* 8.3*   Liver Function Tests:  Recent Labs Lab 11/18/15 1827  AST 23  ALT 19  ALKPHOS 60  BILITOT 0.2*  PROT 5.6*  ALBUMIN  3.1*   No results for input(s): LIPASE, AMYLASE in the last 168 hours. No results for input(s): AMMONIA in the last 168 hours. CBC:  Recent Labs Lab 11/18/15 1827 11/19/15 0058  WBC 14.4* 20.7*  NEUTROABS 13.0* 20.1*  HGB 9.2* 8.4*  HCT 31.7* 28.7*  MCV 89.8 88.6  PLT 388 375   Cardiac Enzymes: No results for input(s): CKTOTAL, CKMB, CKMBINDEX, TROPONINI in the last 168 hours. BNP (last 3 results)  Recent Labs  09/09/15 1350 10/20/15 2215 11/19/15 0058  BNP 102.1* 60.1 90.0    ProBNP (last 3 results) No results for input(s): PROBNP in the last 8760 hours.  CBG:  Recent Labs Lab 11/19/15 0625 11/19/15 1122  GLUCAP 179* 218*    Recent Results (from the past 240 hour(s))  MRSA PCR Screening     Status: None   Collection Time: 11/19/15 12:47 AM  Result Value Ref Range Status   MRSA by PCR NEGATIVE NEGATIVE Final    Comment:        The GeneXpert MRSA Assay (FDA approved for NASAL specimens only), is one component of a comprehensive MRSA colonization surveillance program. It is not intended to diagnose MRSA infection nor to guide or monitor treatment for MRSA infections.      Studies: Dg Chest Port 1 View  11/18/2015  CLINICAL DATA:  75 year old female status post placement  of a left-sided chest tube. EXAM: PORTABLE CHEST 1 VIEW COMPARISON:  Radiograph dated 11/18/2015 FINDINGS: There has been interval placement of a left-sided chest tube the tip over the left apical pleural surface. There has been interval re-expansion of the left lung. No definite pneumothorax identified on the current study. There is a focal area of opacity in the left upper lung field abutting the aortic arch compatible with an area of atelectasis. The right lung is clear. There is coarsened interstitial markings which may represent underlying COPD. There is no pleural effusion. Mild cardiac enlargement. No acute osseous pathology. IMPRESSION: Interval placement of a left-sided chest tube  with insert interval re-expansion of the left lung. No definite residual pneumothorax identified. Electronically Signed   By: Elgie CollardArash  Radparvar M.D.   On: 11/18/2015 19:41   Dg Chest Portable 1 View  11/18/2015  CLINICAL DATA:  Dyspnea today. Pt c/o not being able to catch her breath at all. Hx of CHF, HTN, COPD with acute exacerbation, acute respiratory failure. EXAM: PORTABLE CHEST 1 VIEW COMPARISON:  10/20/2015 FINDINGS: Large left pneumothorax.  There is no mediastinal shift. Right lung is clear. Cardiac silhouette is mildly enlarged. No mediastinal or hilar masses. IMPRESSION: 1. Large left pneumothorax. Critical Value/emergent results were called by telephone at the time of interpretation on 11/18/2015 at 6:12 pm to Dr. Pricilla LovelessSCOTT GOLDSTON , who verbally acknowledged these results. Electronically Signed   By: Amie Portlandavid  Ormond M.D.   On: 11/18/2015 18:12    Scheduled Meds: . apixaban  5 mg Oral BID  . arformoterol  15 mcg Nebulization BID  . diltiazem  180 mg Oral Daily  . feeding supplement (GLUCERNA SHAKE)  237 mL Oral TID BM  . furosemide  40 mg Oral Daily  . guaiFENesin  600 mg Oral BID  . insulin aspart  0-9 Units Subcutaneous TID WC  . ipratropium-albuterol  3 mL Nebulization Q6H  . loratadine  10 mg Oral Daily  . methylPREDNISolone (SOLU-MEDROL) injection  60 mg Intravenous Q6H  . mometasone-formoterol  2 puff Inhalation BID  . pantoprazole  40 mg Oral Daily  . senna-docusate  2 tablet Oral BID  . sodium chloride flush  3 mL Intravenous Q12H  . tiotropium  18 mcg Inhalation Daily   Continuous Infusions:   Time spent: 35 minutes  Tashaya Ancrum L  [facility] [email or web address], password [email or web address] 11/19/2015, 12:29 PM  LOS: 1 day

## 2015-11-19 NOTE — Progress Notes (Signed)
Pt is SOB with minimal activity. Also, pt. HR goes up to 178 when getting up to use the bedside commode.

## 2015-11-19 NOTE — Progress Notes (Signed)
Utilization review completed.  

## 2015-11-19 NOTE — Progress Notes (Signed)
Multiple repeat tx messages from CCMD - notified CCMD after checking on patient that RN in room with patient [facility] RN is at her side.

## 2015-11-19 NOTE — Care Management Note (Signed)
Case Management Note Donn PieriniKristi Breven Guidroz RN, BSN Unit 2W-Case Manager [phone]  Patient Details  Name: Brenda Wells MRN: [id number] Date of Birth: 16-Nov-1940  Subjective/Objective:        Pt admitted with Pntx            Action/Plan: PTA pt lived at home with family- [facility]- [facility] they are aware of pt's admission and will follow for return home with hospice services  Expected Discharge Date:                  Expected Discharge Plan:  Home w Hospice Care  In-House Referral:     Discharge planning Services  CM Consult  Post Acute Care Choice:  Resumption of Svcs/PTA Provider, Hospice Choice offered to:     DME Arranged:    DME Agency:     HH Arranged:    HH Agency:     Status of Service:  In process, will continue to follow  Medicare Important Message Given:    Date Medicare IM Given:    Medicare IM give by:    Date Additional Medicare IM Given:    Additional Medicare Important Message give by:     If discussed at Long Length of Stay Meetings, dates discussed:    Additional Comments:  Darrold SpanWebster, Marni Franzoni Hall, RN 11/19/2015, 11:07 AM

## 2015-11-19 NOTE — Evaluation (Signed)
Physical Therapy Evaluation Patient Details Name: Brenda Wells MRN: [id number] DOB: 02-28-1941 Today's Date: 11/19/2015   History of Present Illness  Patient is a 75 y/o female with hx of COPD on 4L 02, chronic A-fib, chronic diastolic CHF and HTN presents with dyspnea at rest. Pt is chronically ill, receiving hospice services for advanced COPD. CXR- demonstrates a 90% pneumothorax on the left side s/p chest tube placement and successful reexpansion of the lung.   Clinical Impression  Mobility limited today secondary to tachycardia with HR ranging from 108-155 bpm A-fib with minimal mobility. Tolerated multiple SPT within room. Encouraged sitting in chair as much as tolerated. Pt has support from son at home. Ambulating household distances with SPC PTA. Pt has home hospice services. Will need to follow up for gait assessment and stair training so pt can safely get into home at d/c. Will follow acutely.      Follow Up Recommendations No PT follow up;Supervision/Assistance - 24 hour    Equipment Recommendations  None recommended by PT    Recommendations for Other Services       Precautions / Restrictions Precautions Precautions: Fall Precaution Comments: watch HR; Chest tube Restrictions Weight Bearing Restrictions: No      Mobility  Bed Mobility               General bed mobility comments: Sitting EOB upon PT arrival.   Transfers Overall transfer level: Needs assistance Equipment used: None Transfers: Sit to/from [employer] Sit to Stand: Supervision Stand pivot transfers: Supervision       General transfer comment: Supervision to stand from all surfaces. SPT bed to/from [facility] and bed to chair. HR up to 150s bpm A-fib.  Ambulation/Gait Ambulation/Gait assistance:  (Deferred secondary to elevated HR.)              Stairs            Wheelchair Mobility    Modified Rankin (Stroke Patients Only)       Balance Overall balance  assessment: Needs assistance Sitting-balance support: Feet supported;No upper extremity supported Sitting balance-Leahy Scale: Good     Standing balance support: During functional activity Standing balance-Leahy Scale: Fair                               Pertinent Vitals/Pain Pain Assessment: No/denies pain    Home Living Family/patient expects to be discharged to:: Private residence Living Arrangements: Children Available Help at Discharge: Family;Available 24 hours/day Type of Home: House Home Access: Stairs to enter Entrance Stairs-Rails: Lawyer of Steps: 6 Home Layout: One level Home Equipment: Environmental consultant - 2 wheels;Bedside commode;Wheelchair - manual;Walker - standard;Cane - single point      Prior Function Level of Independence: Needs assistance   Gait / Transfers Assistance Needed: Uses SPC for household ambulation, sometimes uses w/c when LEs are hurting or weak. Pt does not leave house.  ADL's / Homemaking Assistance Needed: Sponge bath. Son does IADLs.         Hand Dominance   Dominant Hand: Right    Extremity/Trunk Assessment   Upper Extremity Assessment: Defer to OT evaluation           Lower Extremity Assessment: Generalized weakness         Communication   Communication: No difficulties  Cognition Arousal/Alertness: Awake/alert Behavior During Therapy: WFL for tasks assessed/performed Overall Cognitive Status: Within Functional Limits for tasks assessed  General Comments General comments (skin integrity, edema, etc.): Swelling present in bil feet.     Exercises        Assessment/Plan    PT Assessment Patient needs continued PT services  PT Diagnosis Difficulty walking   PT Problem List Cardiopulmonary status limiting activity;Decreased strength;Decreased balance;Decreased activity tolerance;Decreased mobility  PT Treatment Interventions Balance training;Gait  training;Stair training;Functional mobility training;Therapeutic activities;Therapeutic exercise;Patient/family education   PT Goals (Current goals can be found in the Care Plan section) Acute Rehab PT Goals Patient Stated Goal: to feel better and be able to breathe PT Goal Formulation: With patient Time For Goal Achievement: 12/03/15 Potential to Achieve Goals: Good    Frequency Min 3X/week   Barriers to discharge Inaccessible home environment 6 steps to enter home    Co-evaluation               End of Session Equipment Utilized During Treatment: Oxygen Activity Tolerance: Treatment limited secondary to medical complications (Comment) (tachycardia) Patient left: in chair;with call bell/phone within reach Nurse Communication: Mobility status         Time: [id number] PT Time Calculation (min) (ACUTE ONLY): 20 min   Charges:   PT Evaluation $PT Eval Moderate Complexity: 1 Procedure     PT G Codes:        Mercede Rollo A Yulitza Shorts 11/19/2015, 3:08 PM Mylo RedShauna Markayla Reichart, PT, DPT [phone]

## 2015-11-19 NOTE — Progress Notes (Signed)
Initial Nutrition Assessment  DOCUMENTATION CODES:   Non-severe (moderate) malnutrition in context of chronic illness  INTERVENTION:   Glucerna Shake po TID, each supplement provides 220 kcal and 10 grams of protein  NUTRITION DIAGNOSIS:   Increased nutrient needs related to chronic illness as evidenced by estimated needs  GOAL:   Patient will meet greater than or equal to 90% of their needs  MONITOR:   PO intake, Supplement acceptance, Labs, Weight trends, I & O's  REASON FOR ASSESSMENT:   Consult COPD Protocol  ASSESSMENT:   75 yo Female with a history of COPD on chronic home oxygen as well as CHF presents with acute shortness of breath. She typically goes around in a wheelchair and while she was coming back from the kitchen she noticed acute shortness of breath. Feels somewhat similar to prior COPD exacerbations. Has noticed trace feet swelling but no significant leg edema. No chest pain. Has been having a cough worse than normal for about one week. No fevers. Has tried 502 of year-old treatments with minimal relief. Patient also has home hospice and tried in nebulized morphine with no significant relief either. Has chronic pain but no new pain such as abdominal pain, abdominal swelling, or chest pain.  RD unable to obtain nutrition at time of visit. Seen per Clinical Nutrition early in February 2016. Pt identified with malnutrition which is ongoing. Nutrient needs increased given COPD. RD to add oral nutrition supplements.  Nutrition-Focused physical exam completed 10/21/15.  Findings were moderate fat depletion, moderate muscle depletion, and no edema.   Diet Order:  Diet Heart Room service appropriate?: Yes; Fluid consistency:: Thin; Fluid restriction:: 1200 mL Fluid  Skin:  Reviewed, no issues  Last BM:  3/2   CBG (last 3)   Recent Labs  11/19/15 0625  GLUCAP 179*    Height:   Ht Readings from Last 1 Encounters:  11/18/15 5\' 5"  (1.651 m)    Weight:   Wt  Readings from Last 1 Encounters:  11/19/15 135 lb 8 oz (61.462 kg)    Ideal Body Weight:  56.8 kg  BMI:  Body mass index is 22.55 kg/(m^2).  Estimated Nutritional Needs:   Kcal:  1600-1800  Protein:  70-80 gm  Fluid:  1.6-1.8 L  EDUCATION NEEDS:   No education needs identified at this time  Maureen ChattersKatie Enyla Lisbon, RD, LDN Pager #: [phone] After-Hours Pager #: [phone]

## 2015-11-19 NOTE — Progress Notes (Signed)
Critical Lab: Lactic Acid 2.4 °

## 2015-11-20 ENCOUNTER — Inpatient Hospital Stay (HOSPITAL_COMMUNITY): Payer: Medicare Other

## 2015-11-20 DIAGNOSIS — J939 Pneumothorax, unspecified: Secondary | ICD-10-CM

## 2015-11-20 LAB — GLUCOSE, CAPILLARY
GLUCOSE-CAPILLARY: 157 mg/dL — AB (ref 65–99)
GLUCOSE-CAPILLARY: 219 mg/dL — AB (ref 65–99)
GLUCOSE-CAPILLARY: 217 mg/dL — AB (ref 65–99)
Glucose-Capillary: 195 mg/dL — ABNORMAL HIGH (ref 65–99)

## 2015-11-20 LAB — LACTIC ACID, PLASMA: LACTIC ACID, VENOUS: 1.8 mmol/L (ref 0.5–2.0)

## 2015-11-20 MED ORDER — IPRATROPIUM-ALBUTEROL 0.5-2.5 (3) MG/3ML IN SOLN
3.0000 mL | Freq: Three times a day (TID) | RESPIRATORY_TRACT | Status: DC
  Administered 2015-11-20 – 2015-11-21 (×4): 3 mL via RESPIRATORY_TRACT
  Filled 2015-11-20 (×4): qty 3

## 2015-11-20 MED ORDER — FUROSEMIDE 40 MG PO TABS: 40.0000 mg | ORAL_TABLET | Freq: Every day | ORAL | Status: DC

## 2015-11-20 MED ORDER — ZOLPIDEM TARTRATE 5 MG PO TABS
5.0000 mg | ORAL_TABLET | Freq: Every evening | ORAL | Status: DC | PRN
  Administered 2015-11-21 – 2015-11-24 (×3): 5 mg via ORAL
  Filled 2015-11-20 (×4): qty 1

## 2015-11-20 NOTE — Progress Notes (Signed)
[facility] PROGRESS NOTE  Brenda Wells ZOX:[id number] DOB: 11/25/1940 DOA: 11/18/2015 PCP: [name], MD  Assessment/Plan:  Principal Problem:   Pneumothorax on left s/p CT. Management per TCTS. Adjust pain regimen Active Problems:   COPD exacerbation (HCC): cont pred, nebs.    Chronic atrial fibrillation (HCC) rate better controlled on increased cardizem dose   Chronic anticoagulation - Coumadin, CHADS2VASC=4   Chronic diastolic (congestive) heart failure (HCC): resume metformin   Steroid-induced hyperglycemia   DNR (do not resuscitate): hospice at home   Normocytic anemia   Acute on chronic respiratory failure (HCC)   Lactic acidosis: no evidence of infection. resolved. Could be from metformin. Would not resume.   Code Status:  dnr Family Communication:  None avail Disposition Plan:  Home eventually   HPI/Subjective: DOE worse today. Pain better controlled  Objective: Filed Vitals:   11/20/15 0444 11/20/15 0830  BP: 121/69 112/54  Pulse: 112 96  Temp: 97.7 F (36.5 C) 97.5 F (36.4 C)  Resp: 20 22    Intake/Output Summary (Last 24 hours) at 11/20/15 1358 Last data filed at 11/20/15 1153  Gross per 24 hour  Intake    720 ml  Output   4500 ml  Net  -3780 ml   Filed Weights   11/18/15 2148 11/19/15 0431 11/20/15 0444  Weight: 61.372 kg (135 lb 4.8 oz) 61.462 kg (135 lb 8 oz) 62.506 kg (137 lb 12.8 oz)   Tele a fib rate 100.  Exam:   General:  Comfortable at rest. Very winded with repositioning in bed  Cardiovascular: irreg irreg  Respiratory: CTA without WRR  Abdomen: s, nt, nd  Ext: no CCE  Basic Metabolic Panel:  Recent Labs Lab 11/18/15 1827 11/19/15 0058  NA 140 136  K 4.8 4.8  CL 95* 94*  CO2 30 28  GLUCOSE 185* 260*  BUN 23* 22*  CREATININE 0.96 0.88  CALCIUM 8.8* 8.3*   Liver Function Tests:  Recent Labs Lab 11/18/15 1827  AST 23  ALT 19  ALKPHOS 60  BILITOT 0.2*  PROT 5.6*  ALBUMIN 3.1*   No results for  input(s): LIPASE, AMYLASE in the last 168 hours. No results for input(s): AMMONIA in the last 168 hours. CBC:  Recent Labs Lab 11/18/15 1827 11/19/15 0058  WBC 14.4* 20.7*  NEUTROABS 13.0* 20.1*  HGB 9.2* 8.4*  HCT 31.7* 28.7*  MCV 89.8 88.6  PLT 388 375   Cardiac Enzymes: No results for input(s): CKTOTAL, CKMB, CKMBINDEX, TROPONINI in the last 168 hours. BNP (last 3 results)  Recent Labs  09/09/15 1350 10/20/15 2215 11/19/15 0058  BNP 102.1* 60.1 90.0    ProBNP (last 3 results) No results for input(s): PROBNP in the last 8760 hours.  CBG:  Recent Labs Lab 11/19/15 1122 11/19/15 1552 11/19/15 2117 11/20/15 0622 11/20/15 1131  GLUCAP 218* 205* 183* 157* 219*    Recent Results (from the past 240 hour(s))  MRSA PCR Screening     Status: None   Collection Time: 11/19/15 12:47 AM  Result Value Ref Range Status   MRSA by PCR NEGATIVE NEGATIVE Final    Comment:        The GeneXpert MRSA Assay (FDA approved for NASAL specimens only), is one component of a comprehensive MRSA colonization surveillance program. It is not intended to diagnose MRSA infection nor to guide or monitor treatment for MRSA infections.      Studies: Dg Chest Port 1 View  11/20/2015  CLINICAL DATA:  Left sided chest  tube placement Hx of CHF, HTN, COPD, anemia EXAM: PORTABLE CHEST - 1 VIEW COMPARISON:  the previous day's study FINDINGS: Left chest tube remains in place, directed towards the suprahilar region. Trace apical pneumothorax, lung apex projecting just inferior to the posterior aspect of the second rib. Lateral subcutaneous emphysema persists. Stable moderate cardiomegaly. Atheromatous aorta. Mild perihilar scratch the coarse perihilar interstitial markings as before. Probable small left pleural effusion. Stable patchy atelectasis or infiltrate at the left lung base. Visualized skeletal structures are unremarkable. IMPRESSION: 1. Stable left chest tube with tiny apical pneumothorax.  Electronically Signed   By: Corlis Leak  Hassell M.D.   On: 11/20/2015 09:44   Dg Chest Port 1 View  11/19/2015  CLINICAL DATA:  Respiratory failure EXAM: PORTABLE CHEST 1 VIEW COMPARISON:  11/26/2015 FINDINGS: Stable left chest tube. Stable cardiomegaly. Small left pleural effusion and a stable. Subsegmental atelectasis has increased at the left base. Opacity at the medial left apex has improved. Right lung remains hyperaerated and clear. IMPRESSION: Improved opacity at the left apex. Small left pleural effusion and basilar atelectasis increased. No pneumothorax. Electronically Signed   By: Jolaine ClickArthur  Hoss M.D.   On: 11/19/2015 16:32   Dg Chest Port 1 View  11/18/2015  CLINICAL DATA:  75 year old female status post placement of a left-sided chest tube. EXAM: PORTABLE CHEST 1 VIEW COMPARISON:  Radiograph dated 11/18/2015 FINDINGS: There has been interval placement of a left-sided chest tube the tip over the left apical pleural surface. There has been interval re-expansion of the left lung. No definite pneumothorax identified on the current study. There is a focal area of opacity in the left upper lung field abutting the aortic arch compatible with an area of atelectasis. The right lung is clear. There is coarsened interstitial markings which may represent underlying COPD. There is no pleural effusion. Mild cardiac enlargement. No acute osseous pathology. IMPRESSION: Interval placement of a left-sided chest tube with insert interval re-expansion of the left lung. No definite residual pneumothorax identified. Electronically Signed   By: Elgie CollardArash  Radparvar M.D.   On: 11/18/2015 19:41   Dg Chest Portable 1 View  11/18/2015  CLINICAL DATA:  Dyspnea today. Pt c/o not being able to catch her breath at all. Hx of CHF, HTN, COPD with acute exacerbation, acute respiratory failure. EXAM: PORTABLE CHEST 1 VIEW COMPARISON:  10/20/2015 FINDINGS: Large left pneumothorax.  There is no mediastinal shift. Right lung is clear. Cardiac  silhouette is mildly enlarged. No mediastinal or hilar masses. IMPRESSION: 1. Large left pneumothorax. Critical Value/emergent results were called by telephone at the time of interpretation on 11/18/2015 at 6:12 pm to Dr. Pricilla LovelessSCOTT GOLDSTON , who verbally acknowledged these results. Electronically Signed   By: Amie Portlandavid  Ormond M.D.   On: 11/18/2015 18:12    Scheduled Meds: . apixaban  5 mg Oral BID  . arformoterol  15 mcg Nebulization BID  . diltiazem  240 mg Oral Daily  . feeding supplement (GLUCERNA SHAKE)  237 mL Oral TID BM  . furosemide  40 mg Oral Daily  . guaiFENesin  600 mg Oral BID  . insulin aspart  0-9 Units Subcutaneous TID WC  . ipratropium-albuterol  3 mL Nebulization TID  . loratadine  10 mg Oral Daily  . mometasone-formoterol  2 puff Inhalation BID  . pantoprazole  40 mg Oral Daily  . predniSONE  40 mg Oral BID WC  . senna-docusate  2 tablet Oral BID  . sodium chloride flush  3 mL Intravenous Q12H  . tiotropium  18 mcg Inhalation Daily   Continuous Infusions:   Time spent: 15 minutes  Brenda Wells  [facility] [email or web address], password [facility] 11/20/2015, 1:58 PM  LOS: 2 days

## 2015-11-20 NOTE — Progress Notes (Addendum)
      [address]       [place] [id number]             [phone]            Subjective: Patient without specific complaints this am  Objective: Vital signs in last 24 hours: Temp:  [97.7 F (36.5 C)-97.9 F (36.6 C)] 97.7 F (36.5 C) (03/04 0444) Pulse Rate:  [58-133] 112 (03/04 0444) Cardiac Rhythm:  [-] Atrial fibrillation (03/03 2024) Resp:  [20-24] 20 (03/04 0444) BP: (110-127)/(48-77) 121/69 mmHg (03/04 0444) SpO2:  [95 %-100 %] 100 % (03/04 0649) Weight:  [137 lb 12.8 oz (62.506 kg)] 137 lb 12.8 oz (62.506 kg) (03/04 0444)     Intake/Output from previous day: 03/03 0701 - 03/04 0700 In: 960 [P.O.:960] Out: 4900 [Urine:4900]   Physical Exam:  Cardiovascular: IRRR IRRR Pulmonary: Coarse on left, some subcutaneous emphysema left lateral chest wall Wounds: Dressing is clean and dry.   Chest Tube: to water seal and NOT on suction this am, no air leak  Lab Results: CBC: Recent Labs  11/18/15 1827 11/19/15 0058  WBC 14.4* 20.7*  HGB 9.2* 8.4*  HCT 31.7* 28.7*  PLT 388 375   BMET:  Recent Labs  11/18/15 1827 11/19/15 0058  NA 140 136  K 4.8 4.8  CL 95* 94*  CO2 30 28  GLUCOSE 185* 260*  BUN 23* 22*  CREATININE 0.96 0.88  CALCIUM 8.8* 8.3*    PT/INR: No results for input(s): LABPROT, INR in the last 72 hours. ABG:  INR: Will add last result for INR, ABG once components are confirmed Will add last 4 CBG results once components are confirmed  Assessment/Plan:  1. CV - A fib with HR 80's (has chronic a fib). On Cardizem CD 240 mg daily and Eliquis 5 mg bid. Per medicine. 2.  Pulmonary - On 3 liters via [place] (was on oxygen at home). Chest tube is on water seal NOT suction this am. Chest x ray appears to show questionable trace left apical pneumothorax, subcutaneous emphysema left lateral chest wall. Will continue chest tube to water seal for today. Hope to remove in a couple of days. Encourage incentive spriometer 3. ABL anemia-H  and H yesterday 8.4 and 28.7  ZIMMERMAN,DONIELLE MPA-C 11/20/2015,8:14 AM   Chart reviewed, patient examined, agree with above. Chest tube to water seal and plan to remove Monday.

## 2015-11-21 ENCOUNTER — Inpatient Hospital Stay (HOSPITAL_COMMUNITY): Payer: Medicare Other

## 2015-11-21 DIAGNOSIS — D649 Anemia, unspecified: Secondary | ICD-10-CM

## 2015-11-21 LAB — GLUCOSE, CAPILLARY
GLUCOSE-CAPILLARY: 145 mg/dL — AB (ref 65–99)
Glucose-Capillary: 194 mg/dL — ABNORMAL HIGH (ref 65–99)
Glucose-Capillary: 250 mg/dL — ABNORMAL HIGH (ref 65–99)
Glucose-Capillary: 244 mg/dL — ABNORMAL HIGH (ref 65–99)

## 2015-11-21 LAB — CBC
HCT: 27.2 % — ABNORMAL LOW (ref 36.0–46.0)
HEMOGLOBIN: 8.2 g/dL — AB (ref 12.0–15.0)
MCH: 26.5 pg (ref 26.0–34.0)
MCHC: 30.1 g/dL (ref 30.0–36.0)
MCV: 87.7 fL (ref 78.0–100.0)
Platelets: 385 10*3/uL (ref 150–400)
RBC: 3.1 MIL/uL — AB (ref 3.87–5.11)
RDW: 22.5 % — ABNORMAL HIGH (ref 11.5–15.5)
WBC: 15.7 10*3/uL — AB (ref 4.0–10.5)

## 2015-11-21 LAB — BASIC METABOLIC PANEL
Anion gap: 12 (ref 5–15)
BUN: 30 mg/dL — ABNORMAL HIGH (ref 6–20)
CHLORIDE: 93 mmol/L — AB (ref 101–111)
CO2: 32 mmol/L (ref 22–32)
CREATININE: 0.9 mg/dL (ref 0.44–1.00)
Calcium: 8.9 mg/dL (ref 8.9–10.3)
GFR calc non Af Amer: >60 mL/min (ref >60)
Glucose, Bld: 229 mg/dL — ABNORMAL HIGH (ref 65–99)
POTASSIUM: 4.7 mmol/L (ref 3.5–5.1)
SODIUM: 137 mmol/L (ref 135–145)

## 2015-11-21 LAB — BRAIN NATRIURETIC PEPTIDE: B NATRIURETIC PEPTIDE 5: 284.2 pg/mL — AB (ref 0.0–100.0)

## 2015-11-21 LAB — PREPARE RBC (CROSSMATCH)

## 2015-11-21 MED ORDER — FUROSEMIDE 10 MG/ML IJ SOLN
20.0000 mg | Freq: Once | INTRAMUSCULAR | Status: AC
  Administered 2015-11-21: 20 mg via INTRAVENOUS
  Filled 2015-11-21: qty 2

## 2015-11-21 MED ORDER — METHYLPREDNISOLONE SODIUM SUCC 125 MG IJ SOLR
60.0000 mg | Freq: Two times a day (BID) | INTRAMUSCULAR | Status: DC
  Administered 2015-11-21 – 2015-11-22 (×3): 60 mg via INTRAVENOUS
  Filled 2015-11-21 (×3): qty 2

## 2015-11-21 MED ORDER — DILTIAZEM HCL ER COATED BEADS 180 MG PO CP24
300.0000 mg | ORAL_CAPSULE | Freq: Every day | ORAL | Status: DC
  Administered 2015-11-22 – 2015-11-24 (×3): 300 mg via ORAL
  Filled 2015-11-21 (×4): qty 1

## 2015-11-21 MED ORDER — IPRATROPIUM-ALBUTEROL 0.5-2.5 (3) MG/3ML IN SOLN
3.0000 mL | Freq: Four times a day (QID) | RESPIRATORY_TRACT | Status: DC
  Administered 2015-11-21 – 2015-11-25 (×16): 3 mL via RESPIRATORY_TRACT
  Filled 2015-11-21 (×16): qty 3

## 2015-11-21 MED ORDER — DILTIAZEM HCL 30 MG PO TABS
30.0000 mg | ORAL_TABLET | Freq: Four times a day (QID) | ORAL | Status: AC
  Administered 2015-11-21 – 2015-11-22 (×3): 30 mg via ORAL
  Filled 2015-11-21 (×3): qty 1

## 2015-11-21 NOTE — Progress Notes (Signed)
Blood has been started. Vitals stable. Will continue to monitor.  Berdine DanceLauren Moffitt RN, BSN

## 2015-11-21 NOTE — Evaluation (Signed)
Occupational Therapy Evaluation Patient Details Name: Brenda Wells MRN: [id number] DOB: 1941/02/16 Today's Date: 11/21/2015    History of Present Illness Patient is a 75 y.o. female with hx of COPD on 4L 02, chronic A-fib, acute respiratory failure, anemia, back surgery, hypokalemia, hyponatremia, chronic diastolic CHF and HTN presented with dyspnea at rest. Pt is chronically ill, receiving hospice services for advanced COPD. CXR- demonstrates a 90% pneumothorax on the left side s/p chest tube placement and successful reexpansion of the lung.    Clinical Impression   Pt s/p above. Pt independent with ADLs, PTA. Feel pt will benefit from acute OT to increase independence and reinforce energy conservation techniques prior to d/c. Pt getting hospice services at home and not wanting HHOT.    Follow Up Recommendations  No OT follow up;Supervision/Assistance - 24 hour    Equipment Recommendations  None recommended by OT    Recommendations for Other Services       Precautions / Restrictions Precautions Precautions: Fall Precaution Comments: watch HR; Chest tube Restrictions Weight Bearing Restrictions: No      Mobility Bed Mobility    Needs assistance: Supine to sit     Supine to sit: Supervision        Transfers Overall transfer level: Needs assistance   Transfers: Sit to/from Stand Sit to Stand: Min guard              Balance    No LOB in session. Balance not formally assessed.                                         ADL Overall ADL's : Needs assistance/impaired     Grooming: Brushing hair;Sitting;Set up;Supervision/safety               Lower Body Dressing: Min guard;Sit to/from stand   Toilet Transfer: Min guard (sit to stand from bed)             General ADL Comments: Educated on energy conservation and deep breathing technique.  Mentioned to her that AE is available.     Vision     Perception     Praxis       Pertinent Vitals/Pain Pain Assessment: 0-10 Pain Score: 8  Pain Location: left side and back Pain Intervention(s): Monitored during session  HR up to 150 in session but trended down. Nurse notified.     Hand Dominance     Extremity/Trunk Assessment Upper Extremity Assessment Upper Extremity Assessment: Generalized weakness   Lower Extremity Assessment Lower Extremity Assessment: Defer to PT evaluation       Communication Communication Communication: No difficulties   Cognition Arousal/Alertness: Awake/alert Behavior During Therapy: WFL for tasks assessed/performed Overall Cognitive Status: No family/caregiver present to determine baseline cognitive functioning (decreased safety awareness)                     General Comments       Exercises       Shoulder Instructions      Home Living Family/patient expects to be discharged to:: Private residence Living Arrangements: Children Available Help at Discharge: Family;Available 24 hours/day Type of Home: House Home Access: Stairs to enter Enter[employer] CorporationEntrance Stairs-Number of Steps: 6 Entrance Stairs-Rails: Left;Right Home Layout: One level     Bathroom Shower/Tub: Walk-in shower;Tub/shower unit (per PT eval, pt does a sponge bath)   Bathroom Toilet: Standard  Home Equipment: Walker - 2 wheels;Wheelchair - manual;Walker - standard;Cane - single point;Shower seat;Bedside commode          Prior Functioning/Environment Level of Independence: Needs assistance  Gait / Transfers Assistance Needed: Uses SPC for household ambulation, sometimes uses wheelchair when LEs are hurting or weak. Pt does not leave house. ADL's / Homemaking Assistance Needed: Sponge bath. Son assists with cooking and cleaning        OT Diagnosis: Generalized weakness;Other (comment) (deceased activity tolerance)   OT Problem List: Decreased strength;Decreased activity tolerance;Decreased safety awareness;Pain;Cardiopulmonary status  limiting activity;Decreased knowledge of use of DME or AE;Decreased knowledge of precautions   OT Treatment/Interventions: Self-care/ADL training;DME and/or AE instruction;Energy conservation;Balance training;Patient/family education;Therapeutic activities;Cognitive remediation/compensation    OT Goals(Current goals can be found in the care plan section) Acute Rehab OT Goals Patient Stated Goal: to do what I can do OT Goal Formulation: With patient Time For Goal Achievement: 11/28/15 Potential to Achieve Goals: Good ADL Goals Pt Will Transfer to Toilet: with set-up;ambulating;stand pivot transfer;bedside commode Additional ADL Goal #1: Pt will independently state 3 energy conservation techniques and utilize in session during functional activities.  OT Frequency: Min 2X/week   Barriers to D/C:            Co-evaluation              End of Session Equipment Utilized During Treatment: Oxygen  Activity Tolerance: Patient limited by fatigue;Other (comment) (high HR) Patient left: in bed;with nursing/sitter in room (sitting EOB)  Nurse communication: may want to set bed alarm; HR   Time: 1352-1409 OT Time Calculation (min): 17 min Charges:  OT General Charges $OT Visit: 1 Procedure OT Evaluation $OT Eval Moderate Complexity: 1 Procedure G-CodesEarlie Raveling OTR/L [phone] 11/21/2015, 3:12 PM

## 2015-11-21 NOTE — Progress Notes (Signed)
Pt is tolerating blood transfusion well. Will continue to monitor.   Berdine DanceLauren Moffitt RN, BSN

## 2015-11-21 NOTE — Progress Notes (Signed)
[facility] PROGRESS NOTE  Brenda Wells:[id number] DOB: 1941/05/23 DOA: 11/18/2015 PCP: [name], MD  Assessment/Plan:  Principal Problem:   Pneumothorax on left s/p CT. Possibly d/c CT tomorrow per TCTS Active Problems:   COPD exacerbation (HCC): wheezing worse today. Change duonebs to qid. Change pred to solumedrol   Chronic atrial fibrillation (HCC) rate still intermittently above 180, but on average about 100. Blood pressure can tolerate an increase in Cardizem. Will increase to 300 mg daily. Avoid beta blockers due to severe COPD. Have asked nursing staff to give a dose of morphine to help with dyspnea.   Chronic anticoagulation - Coumadin, CHADS2VASC=4   Chronic diastolic (congestive) heart failure (HCC): Lasix resumed. BNP only 200 and chest x-ray without pulmonary edema   Steroid-induced hyperglycemia   DNR (do not resuscitate): hospice at home   Normocytic anemia: No evidence of bleeding. With severe dyspnea, tachycardia, heart failure and severe COPD, would likely do better with a hemoglobin closer to 10. 8.2 today. Will give a unit of blood and this may help her dyspnea, tachycardia   Acute on chronic respiratory failure (HCC)   Lactic acidosis: no evidence of infection. resolved. Could be from metformin. Would not resume.   Code Status:  dnr Family Communication:  None avail Disposition Plan:  Home eventually   HPI/Subjective: DOE still very severe. Wheezing more today. Doesn't feel well. Denies orthopnea. Did have an episode of paroxysmal nocturnal dyspnea last night.  Objective: Filed Vitals:   11/21/15 0554 11/21/15 1108  BP: 124/63 128/70  Pulse: 94 101  Temp: 98 F (36.7 C)   Resp: 20     Intake/Output Summary (Last 24 hours) at 11/21/15 1327 Last data filed at 11/21/15 1300  Gross per 24 hour  Intake    840 ml  Output   5060 ml  Net  -4220 ml   Filed Weights   11/19/15 0431 11/20/15 0444 11/21/15 0554  Weight: 61.462 kg (135 lb 8  oz) 62.506 kg (137 lb 12.8 oz) 63.413 kg (139 lb 12.8 oz)   Tele a fib rate 100, with periods up to about 180 Exam:   General:  Appears less comfortable today with mild to moderate respiratory distress  Cardiovascular: irreg irreg  Respiratory: Bilateral wheeze and prolonged expiratory phase.   Abdomen: s, nt, nd  Ext: no CCE  Basic Metabolic Panel:  Recent Labs Lab 11/18/15 1827 11/19/15 0058 11/21/15 0402  NA 140 136 137  K 4.8 4.8 4.7  CL 95* 94* 93*  CO2 30 28 32  GLUCOSE 185* 260* 229*  BUN 23* 22* 30*  CREATININE 0.96 0.88 0.90  CALCIUM 8.8* 8.3* 8.9   Liver Function Tests:  Recent Labs Lab 11/18/15 1827  AST 23  ALT 19  ALKPHOS 60  BILITOT 0.2*  PROT 5.6*  ALBUMIN 3.1*   No results for input(s): LIPASE, AMYLASE in the last 168 hours. No results for input(s): AMMONIA in the last 168 hours. CBC:  Recent Labs Lab 11/18/15 1827 11/19/15 0058 11/21/15 0402  WBC 14.4* 20.7* 15.7*  NEUTROABS 13.0* 20.1*  --   HGB 9.2* 8.4* 8.2*  HCT 31.7* 28.7* 27.2*  MCV 89.8 88.6 87.7  PLT 388 375 385   Cardiac Enzymes: No results for input(s): CKTOTAL, CKMB, CKMBINDEX, TROPONINI in the last 168 hours. BNP (last 3 results)  Recent Labs  10/20/15 2215 11/19/15 0058 11/21/15 0402  BNP 60.1 90.0 284.2*    ProBNP (last 3 results) No results for input(s): PROBNP in the  last 8760 hours.  CBG:  Recent Labs Lab 11/20/15 1131 11/20/15 1609 11/20/15 2131 11/21/15 0615 11/21/15 1156  GLUCAP 219* 217* 195* 145* 194*    Recent Results (from the past 240 hour(s))  MRSA PCR Screening     Status: None   Collection Time: 11/19/15 12:47 AM  Result Value Ref Range Status   MRSA by PCR NEGATIVE NEGATIVE Final    Comment:        The GeneXpert MRSA Assay (FDA approved for NASAL specimens only), is one component of a comprehensive MRSA colonization surveillance program. It is not intended to diagnose MRSA infection nor to guide or monitor treatment  for MRSA infections.      Studies: Dg Chest Port 1 View  11/21/2015  CLINICAL DATA:  Pneumothorax, history acute respiratory failure, COPD, CHF, hypertension, smoking EXAM: PORTABLE CHEST 1 VIEW COMPARISON:  Portable exam 0752 hours compared to 11/20/2015 FINDINGS: LEFT thoracostomy tube stable. Enlargement of cardiac silhouette. Atherosclerotic calcification aorta. Mediastinal contours and pulmonary vascularity normal. Emphysematous changes without infiltrate, pleural effusion or pneumothorax. Minimal bibasilar atelectasis. No acute osseous findings. Emphysema lower lateral LEFT chest wall again noted. IMPRESSION: COPD changes with minimal bibasilar atelectasis. Enlargement of cardiac silhouette. Mild LEFT thoracostomy tube without pneumothorax. Electronically Signed   By: Ulyses Southward M.D.   On: 11/21/2015 09:03   Dg Chest Port 1 View  11/20/2015  CLINICAL DATA:  Left sided chest tube placement Hx of CHF, HTN, COPD, anemia EXAM: PORTABLE CHEST - 1 VIEW COMPARISON:  the previous day's study FINDINGS: Left chest tube remains in place, directed towards the suprahilar region. Trace apical pneumothorax, lung apex projecting just inferior to the posterior aspect of the second rib. Lateral subcutaneous emphysema persists. Stable moderate cardiomegaly. Atheromatous aorta. Mild perihilar scratch the coarse perihilar interstitial markings as before. Probable small left pleural effusion. Stable patchy atelectasis or infiltrate at the left lung base. Visualized skeletal structures are unremarkable. IMPRESSION: 1. Stable left chest tube with tiny apical pneumothorax. Electronically Signed   By: Corlis Leak M.D.   On: 11/20/2015 09:44   Dg Chest Port 1 View  11/19/2015  CLINICAL DATA:  Respiratory failure EXAM: PORTABLE CHEST 1 VIEW COMPARISON:  11/26/2015 FINDINGS: Stable left chest tube. Stable cardiomegaly. Small left pleural effusion and a stable. Subsegmental atelectasis has increased at the left base. Opacity at  the medial left apex has improved. Right lung remains hyperaerated and clear. IMPRESSION: Improved opacity at the left apex. Small left pleural effusion and basilar atelectasis increased. No pneumothorax. Electronically Signed   By: Jolaine Click M.D.   On: 11/19/2015 16:32    Scheduled Meds: . apixaban  5 mg Oral BID  . arformoterol  15 mcg Nebulization BID  . diltiazem  240 mg Oral Daily  . feeding supplement (GLUCERNA SHAKE)  237 mL Oral TID BM  . furosemide  40 mg Oral Daily  . guaiFENesin  600 mg Oral BID  . insulin aspart  0-9 Units Subcutaneous TID WC  . ipratropium-albuterol  3 mL Nebulization QID  . loratadine  10 mg Oral Daily  . mometasone-formoterol  2 puff Inhalation BID  . pantoprazole  40 mg Oral Daily  . predniSONE  40 mg Oral BID WC  . senna-docusate  2 tablet Oral BID  . sodium chloride flush  3 mL Intravenous Q12H  . tiotropium  18 mcg Inhalation Daily   Continuous Infusions:   Time spent: 25 minutes  Tenise Stetler L  [facility] [email or web address], password [facility] 11/21/2015, 1:27  PM  LOS: 3 days

## 2015-11-21 NOTE — Progress Notes (Addendum)
      [address]       [place] [id number]             [phone]            Subjective: Patient eating breakfast this am. She states her breathing does not seem as good this am  Objective: Vital signs in last 24 hours: Temp:  [97.5 F (36.4 C)-98.2 F (36.8 C)] 98 F (36.7 C) (03/05 0554) Pulse Rate:  [60-125] 94 (03/05 0554) Cardiac Rhythm:  [-] Other (Comment) (03/04 1900) Resp:  [20-24] 20 (03/05 0554) BP: (112-130)/(53-63) 124/63 mmHg (03/05 0554) SpO2:  [95 %-100 %] 97 % (03/05 0554) Weight:  [139 lb 12.8 oz (63.413 kg)] 139 lb 12.8 oz (63.413 kg) (03/05 0554)     Intake/Output from previous day: 03/04 0701 - 03/05 0700 In: 1320 [P.O.:1320] Out: 5700 [Urine:5700]   Physical Exam:  Cardiovascular: IRRR IRRR Pulmonary: Coarse on left, some subcutaneous emphysema left lateral chest wall Wounds: Dressing is clean and dry.   Chest Tube: to water seal , no air leak  Lab Results: CBC:  Recent Labs  11/19/15 0058 11/21/15 0402  WBC 20.7* 15.7*  HGB 8.4* 8.2*  HCT 28.7* 27.2*  PLT 375 385   BMET:   Recent Labs  11/19/15 0058 11/21/15 0402  NA 136 137  K 4.8 4.7  CL 94* 93*  CO2 28 32  GLUCOSE 260* 229*  BUN 22* 30*  CREATININE 0.88 0.90  CALCIUM 8.3* 8.9    PT/INR: No results for input(s): LABPROT, INR in the last 72 hours. ABG:  INR: Will add last result for INR, ABG once components are confirmed Will add last 4 CBG results once components are confirmed  Assessment/Plan:  1. CV - A fib with HR 80's (has chronic a fib). On Cardizem CD 240 mg daily and Eliquis 5 mg bid. Per medicine. 2.  Pulmonary - On 3 liters via [place] (was on oxygen at home). Chest tube is on water seal and there is no air leak. CXR appears stable. Chest tube to remain today and hope to remove in am. Encourage incentive spriometer 3. ABL anemia-H and H yesterday 8.2 and 27.2  ZIMMERMAN,DONIELLE MPA-C 11/21/2015,8:01 AM   Chart reviewed, patient examined,  agree with above. No air leak from chest tube. CXR in am and plan to remove tube then if no ptx.

## 2015-11-22 ENCOUNTER — Inpatient Hospital Stay (HOSPITAL_COMMUNITY): Payer: Medicare Other

## 2015-11-22 LAB — TYPE AND SCREEN
ABO/RH(D): A POS
Antibody Screen: NEGATIVE
Unit division: 0

## 2015-11-22 LAB — HEMOGLOBIN AND HEMATOCRIT, BLOOD
HEMATOCRIT: 32.8 % — AB (ref 36.0–46.0)
Hemoglobin: 10 g/dL — ABNORMAL LOW (ref 12.0–15.0)

## 2015-11-22 LAB — GLUCOSE, CAPILLARY
GLUCOSE-CAPILLARY: 252 mg/dL — AB (ref 65–99)
Glucose-Capillary: 207 mg/dL — ABNORMAL HIGH (ref 65–99)
Glucose-Capillary: 248 mg/dL — ABNORMAL HIGH (ref 65–99)

## 2015-11-22 MED ORDER — PREDNISONE 20 MG PO TABS
40.0000 mg | ORAL_TABLET | Freq: Two times a day (BID) | ORAL | Status: DC
  Administered 2015-11-22 – 2015-11-23 (×2): 40 mg via ORAL
  Filled 2015-11-22 (×2): qty 2

## 2015-11-22 NOTE — Progress Notes (Signed)
Inpatient Diabetes Program Recommendations  AACE/ADA: New Consensus Statement on Inpatient Glycemic Control (2015)  Target Ranges:  Prepandial:   less than 140 mg/dL      Peak postprandial:   less than 180 mg/dL (1-2 hours)      Critically ill patients:  140 - 180 mg/dL   Review of Glycemic Control:  Results for Brenda Wells, Brenda Wells (MRN [id number]) as of 11/22/2015 13:10  Ref. Range 11/21/2015 11:56 11/21/2015 16:37 11/21/2015 21:54 11/22/2015 06:54 11/22/2015 11:43  Glucose-Capillary Latest Ref Range: 65-99 mg/dL 914194 (H) 782250 (H) 956244 (H) 207 (H) 252 (H)   Diabetes history: Type 2 diabetes Outpatient Diabetes medications: Metformin 500 mg daily Current orders for Inpatient glycemic control:  Novolog sensitive tid with meals, Solumedrol 60 mg bid  Inpatient Diabetes Program Recommendations:    Note CBG's increased likely from IV steroids.  May consider adding Novolog meal coverage 3 units tid with meals while patient is on steroids.  May also benefit from low dose basal insulin such as Levemir 8 units daily.  Thanks, Beryl MeagerJenny Marcheta Horsey, RN, BC-ADM Inpatient Diabetes Coordinator Pager [phone] (8a-5p)

## 2015-11-22 NOTE — Progress Notes (Signed)
Physical Therapy Treatment Patient Details Name: Brenda Wells MRN: [id number] DOB: [birth year] Today's Date: 11/22/2015    History of Present Illness Patient is a 75 y.o. female with hx of COPD on 4L 02, chronic A-fib, acute respiratory failure, anemia, back surgery, hypokalemia, hyponatremia, chronic diastolic CHF and HTN presented with dyspnea at rest. Pt is chronically ill, receiving hospice services for advanced COPD. CXR- demonstrates a 90% pneumothorax on the left side s/p chest tube placement and successful reexpansion of the lung.     PT Comments    Patient progressing well towards PT goals. Pt with dyspnea on exertion and requires longer seated rest breaks due to fatigue and SOB. Tolerated gait training with Min guard assist for safety. Sp02 remained in 90s during ambulation on 4L/min 02. Reviewed pursed lip breathing. Discussed energy conservation techniques. Pt reports her son and neighbor will be boosting her up the steps in w/c. Will follow acutely.   Follow Up Recommendations  No PT follow up;Supervision/Assistance - 24 hour     Equipment Recommendations  None recommended by PT    Recommendations for Other Services       Precautions / Restrictions Precautions Precautions: Fall Precaution Comments: watch HR Restrictions Weight Bearing Restrictions: No    Mobility  Bed Mobility Overal bed mobility: Needs Assistance Bed Mobility: Supine to Sit     Supine to sit: Modified independent (Device/Increase time);HOB elevated     General bed mobility comments: No assist needed. Use of rail.  Transfers Overall transfer level: Needs assistance Equipment used: Rolling walker (2 wheeled) Transfers: Sit to/from Stand Sit to Stand: Supervision         General transfer comment: Supervision for safety. Stood from [employer]stateEOB x1, from chair x1. Transferred to chair post ambulation bout.  Ambulation/Gait Ambulation/Gait assistance: Min guard Ambulation Distance (Feet): 75 Feet  (+ 100' + 50') Assistive device: Rolling walker (2 wheeled) Gait Pattern/deviations: Step-through pattern;Decreased stride length;Trunk flexed Gait velocity: decreased   General Gait Details: Slow, steady gait. 2 seated rest breaks due to fatigue/SOB. 3/4 DOE. HR up to 142 bpm.    Stairs            Wheelchair Mobility    Modified Rankin (Stroke Patients Only)       Balance Overall balance assessment: Needs assistance Sitting-balance support: Feet supported;No upper extremity supported Sitting balance-Leahy Scale: Good     Standing balance support: During functional activity Standing balance-Leahy Scale: Fair                      Cognition Arousal/Alertness: Awake/alert Behavior During Therapy: WFL for tasks assessed/performed Overall Cognitive Status: Within Functional Limits for tasks assessed                      Exercises      General Comments General comments (skin integrity, edema, etc.): Reviewed pursed lip breathing.      Pertinent Vitals/Pain Pain Assessment: No/denies pain    Home Living                      Prior Function            PT Goals (current goals can now be found in the care plan section) Progress towards PT goals: Progressing toward goals    Frequency  Min 3X/week    PT Plan Current plan remains appropriate    Co-evaluation  End of Session Equipment Utilized During Treatment: Oxygen;Gait belt Activity Tolerance: Patient tolerated treatment well Patient left: in chair;with call bell/phone within reach     Time: 1444-1515 PT Time Calculation (min) (ACUTE ONLY): 31 min  Charges:  $Gait Training: 23-37 mins                    G Codes:      Matheson Vandehei A Leverne Amrhein 11/22/2015, 3:32 PM  Mylo Red, PT, DPT [phone]

## 2015-11-22 NOTE — Discharge Instructions (Signed)
Pneumothorax °A pneumothorax, commonly called a collapsed lung, is a condition in which air leaks from a lung and builds up in the space between the lung and the chest wall (pleural space). The air in a pneumothorax is trapped outside the lung and takes up space, preventing the lung from fully expanding. This is a condition that usually occurs suddenly. The buildup of air may be small or large. A small pneumothorax may go away on its own. When a pneumothorax is larger, it will often require medical treatment and hospitalization.  °CAUSES  °A pneumothorax can sometimes happen quickly with no apparent cause. People with underlying lung problems, particularly COPD or emphysema, are at higher risk of pneumothorax. However, pneumothorax can happen quickly even in people with no prior known lung problems. Trauma, surgery, medical procedures, or injury to the chest wall can also cause a pneumothorax. °SIGNS AND SYMPTOMS  °Sometimes a pneumothorax will have no symptoms. When symptoms are present, they can include: °· Chest pain. °· Shortness of breath. °· Increased rate of breathing. °· Bluish color to your lips or skin (cyanosis). °DIAGNOSIS  °Pneumothorax is usually diagnosed by a chest X-ray or chest CT scan. Your health care provider will also take a medical history and perform a physical exam to determine why you may have a pneumothorax. °TREATMENT  °A small pneumothorax may go away on its own without treatment. Extra oxygen can sometimes help a small pneumothorax go away more quickly. For a larger pneumothorax or a pneumothorax that is causing symptoms, a procedure is usually needed to drain the air. In some cases, the health care provider may drain the air using a needle. In other cases, a chest tube may be inserted into the pleural space. A chest tube is a small tube placed between the ribs and into the pleural space. This removes the extra air and allows the lung to expand back to its normal size. A large  pneumothorax will usually require a hospital stay. If there is ongoing air leakage into the pleural space, then the chest tube may need to remain in place for several days until the air leak has healed. In some cases, surgery may be needed.  °HOME CARE INSTRUCTIONS  °· Only take over-the-counter or prescription medicines as directed by your health care provider. °· If a cough or pain makes it difficult for you to sleep at night, try sleeping in a semi-upright position in a recliner or by using 2 or 3 pillows. °· Rest and limit activity as directed by your health care provider. °· If you had a chest tube and it was removed, ask your health care provider when it is okay to remove the dressing. Until your health care provider says you can remove the dressing, do not allow it to get wet. °· Do not smoke. Smoking is a risk factor for pneumothorax. °· Do not fly in an airplane or scuba dive until your health care provider says it is okay. °· Follow up with your health care provider as directed. °SEEK IMMEDIATE MEDICAL CARE IF:  °· You have increasing chest pain or shortness of breath. °· You have a cough that is not controlled with suppressants. °· You begin coughing up blood. °· You have pain that is getting worse or is not controlled with medicines. °· You cough up thick, discolored mucus (sputum) that is yellow to green in color. °· You have redness, increasing pain, or discharge at the site where a chest tube had been in place (if   your pneumothorax was treated with a chest tube). °· The site where your chest tube was located opens up. °· You feel air coming out of the site where the chest tube was placed. °· You have a fever or persistent symptoms for more than 2-3 days. °· You have a fever and your symptoms suddenly get worse. °MAKE SURE YOU:  °· Understand these instructions. °· Will watch your condition. °· Will get help right away if you are not doing well or get worse. °  °This information is not intended to  replace advice given to you by your health care provider. Make sure you discuss any questions you have with your health care provider. °  °Document Released: 09/04/2005 Document Revised: 06/25/2013 Document Reviewed: 04/03/2013 °Elsevier Interactive Patient Education ©2016 [employer] ° °

## 2015-11-22 NOTE — Care Management Important Message (Signed)
Important Message  Patient Details  Name: Brenda Wells MRN: [id number] Date of Birth: May 31, 1941   Medicare Important Message Given:  Yes    [name], [name] [name] 11/22/2015, 2:47 PM

## 2015-11-22 NOTE — Progress Notes (Signed)
PT Cancellation Note  Patient Details Name: Brenda Wells MRN: [id number] DOB: October 24, 1940   Cancelled Treatment:    Reason Eval/Treat Not Completed: Patient declined, no reason specified Pt told OT she declined getting OOB until chest tube was out. Spoke with RN who plans to take it out soon and will await chest XRAY prior to PT tx. Will follow up.   Blake DivineShauna A Willem Klingensmith 11/22/2015, 10:25 AM  Mylo RedShauna Jase Himmelberger, PT, DPT [phone]

## 2015-11-22 NOTE — Consult Note (Signed)
   [facility]   11/22/2015  Brenda Wells 07-15-1941 [id number]   [facility]. Confirmed with inpatient RNCM that patient is [facility]. [facility] not appropriate at this time since patient is active with home hospice agency.   Raiford NobleAtika Lillyahna Hemberger, MSN-Ed, RN,BSN [facility] Hospital Liaison [phone]

## 2015-11-22 NOTE — Progress Notes (Addendum)
Nursing note- 11/21/15 20:15  RN in to assess pt due to blood transfusion completed. RN went to put post transfusion vitals in EPIC and noted blood was not scanned into the computer. Empty blood bag scanned and verified by second RN Delaney Meigsamara. VSS, pt resting in bed. Transfusion completed in EPIC. RN will continue to monitor pt.  Roselie AwkwardMegan Devonia Farro, RN

## 2015-11-22 NOTE — Progress Notes (Signed)
Occupational Therapy Treatment Patient Details Name: Brenda Wells MRN: [id number] DOB: Nov 05, 1940 Today's Date: 11/22/2015    History of present illness Patient is a 75 y.o. female with hx of COPD on 4L 02, chronic A-fib, acute respiratory failure, anemia, back surgery, hypokalemia, hyponatremia, chronic diastolic CHF and HTN presented with dyspnea at rest. Pt is chronically ill, receiving hospice services for advanced COPD. CXR- demonstrates a 90% pneumothorax on the left side s/p chest tube placement and successful reexpansion of the lung.    OT comments  Pt. Seen for energy conservation education.  Provided handout and reviewed techniques for implementing at home.  Pt. Reports she lives with son and his fiance and they both provide 24/7 as needed.    Follow Up Recommendations  No OT follow up;Supervision/Assistance - 24 hour    Equipment Recommendations  None recommended by OT    Recommendations for Other Services      Precautions / Restrictions Precautions Precautions: Fall Precaution Comments: watch HR; Chest tube       Mobility Bed Mobility               General bed mobility comments: pt. declined oob this session  Transfers                 General transfer comment: pt. declined oob this session    Balance                                   ADL Overall ADL's : Needs assistance/impaired                                       General ADL Comments: Educated on energy conservation techniques.  pt. provided detailed steps of how she performs her morning routine.  verbalized that she requires rest breaks after each task.  also that eating strains her energy as well.  uses bsc at night for safety but states she is able to amb. to b.room during the day.  pt. reports she lives with son and his fiance who provide full assistance as needed.  provided energy conservation handout and reviewed with pt.  she laughed and said "so i dont forget  right".  declined out of bed until her chest tube is removed. states should be sometime this am      Vision                     Perception     Praxis      Cognition   Behavior During Therapy: [facility] for tasks assessed/performed Overall Cognitive Status: No family/caregiver present to determine baseline cognitive functioning                       Extremity/Trunk Assessment               Exercises     Shoulder Instructions       General Comments      Pertinent Vitals/ Pain          Home Living                                          Prior Functioning/Environment  Frequency Min 2X/week     Progress Toward Goals  OT Goals(current goals can now be found in the care plan section)  Progress towards OT goals: Progressing toward goals     Plan Discharge plan remains appropriate    Co-evaluation                 End of Session Equipment Utilized During Treatment: Oxygen   Activity Tolerance Patient tolerated treatment well   Patient Left in bed;with call bell/phone within reach   Nurse Communication          Time: 0902-0913 OT Time Calculation (min): 11 min  Charges: OT General Charges $OT Visit: 1 Procedure OT Treatments $Self Care/Home Management : 8-22 mins  Robet LeuMorris, Harriett Azar [place], COTA/L 11/22/2015, 9:31 AM

## 2015-11-22 NOTE — Progress Notes (Signed)
      [address]       [place] [id number]             [phone]      Subjective:  Ms. Brenda Wells is feeling much better than she did yesterday.  She has no complaints.  Objective: Vital signs in last 24 hours: Temp:  [97.7 F (36.5 C)-98.4 F (36.9 C)] 98.3 F (36.8 C) (03/06 0518) Pulse Rate:  [72-128] 92 (03/06 0518) Cardiac Rhythm:  [-] Other (Comment) (03/05 1900) Resp:  [20-25] 20 (03/06 0518) BP: (116-150)/(56-85) 117/56 mmHg (03/06 0518) SpO2:  [94 %-98 %] 98 % (03/06 0518) Weight:  [138 lb 3.2 oz (62.687 kg)] 138 lb 3.2 oz (62.687 kg) (03/06 0518)  Intake/Output from previous day: 03/05 0701 - 03/06 0700 In: 720 [P.O.:720] Out: 5885 [Urine:5875; Chest Tube:10]  General appearance: alert, cooperative and no distress Heart: irregularly irregular rhythm Lungs: coarse on left, continued sub q emphysema Wound: clean and dry  Lab Results:  Recent Labs  11/21/15 0402 11/22/15 0213  WBC 15.7*  --   HGB 8.2* 10.0*  HCT 27.2* 32.8*  PLT 385  --    BMET:  Recent Labs  11/21/15 0402  NA 137  K 4.7  CL 93*  CO2 32  GLUCOSE 229*  BUN 30*  CREATININE 0.90  CALCIUM 8.9    PT/INR: No results for input(s): LABPROT, INR in the last 72 hours. ABG    Component Value Date/Time   PHART 7.457* 10/20/2015 2348   HCO3 38.5* 10/20/2015 2348   TCO2 40 10/20/2015 2348   O2SAT 99.0 10/20/2015 2348   CBG (last 3)   Recent Labs  11/21/15 1637 11/21/15 2154 11/22/15 0654  GLUCAP 250* 244* 207*    Assessment/Plan:  1. Chest tube- no air leak,  CXR is free from pneumothorax 2. Dispo- will d/c chest tube today, repeat CXR in AM, care per primary   LOS: 4 days    Obert Espindola 11/22/2015

## 2015-11-22 NOTE — Progress Notes (Signed)
[facility] PROGRESS NOTE  BIANEY TESORO ZOX:[id number] DOB: [birth year] DOA: 11/18/2015 PCP: [name], MD  Assessment/Plan:  Principal Problem:   Pneumothorax on left: chest tube out. Repeat CXR in am Active Problems:   COPD exacerbation (HCC): wheezing and dyspnea improved today change steroid back to PO. Transfusion likely helped   Chronic atrial fibrillation (HCC) rate improved on higher cardizem dose   Chronic anticoagulation - Coumadin, CHADS2VASC=4   Chronic diastolic (congestive) heart failure (HCC): Lasix resumed. BNP only 200 and chest x-ray without pulmonary edema   Steroid-induced hyperglycemia   DNR (do not resuscitate): hospice at home   Normocytic anemia: dyspnea, tachycardia improved after 1 unit prbc   Acute on chronic respiratory failure (HCC)   Lactic acidosis: no evidence of infection. resolved. Could be from metformin. Would not resume.   Code Status:  dnr Family Communication:  None avail Disposition Plan:  Home 1-2 days if stable   HPI/Subjective: Breathing better today. At baseline, only able to walk about 10 feet  Objective: Filed Vitals:   11/22/15 1034 11/22/15 1412  BP: 130/68 115/48  Pulse:  99  Temp:  98.1 F (36.7 C)  Resp:  18    Intake/Output Summary (Last 24 hours) at 11/22/15 1435 Last data filed at 11/22/15 1231  Gross per 24 hour  Intake    963 ml  Output   4576 ml  Net  -3613 ml   Filed Weights   11/20/15 0444 11/21/15 0554 11/22/15 0518  Weight: 62.506 kg (137 lb 12.8 oz) 63.413 kg (139 lb 12.8 oz) 62.687 kg (138 lb 3.2 oz)   Tele a fib rate 100,  Exam:   General:  Asleep. Arousable. Lying flat. More comfortable  Cardiovascular: irreg irreg  Respiratory: diminished. No WRR   Abdomen: s, nt, nd  Ext: no CCE  Basic Metabolic Panel:  Recent Labs Lab 11/18/15 1827 11/19/15 0058 11/21/15 0402  NA 140 136 137  K 4.8 4.8 4.7  CL 95* 94* 93*  CO2 30 28 32  GLUCOSE 185* 260* 229*  BUN 23* 22* 30*   CREATININE 0.96 0.88 0.90  CALCIUM 8.8* 8.3* 8.9   Liver Function Tests:  Recent Labs Lab 11/18/15 1827  AST 23  ALT 19  ALKPHOS 60  BILITOT 0.2*  PROT 5.6*  ALBUMIN 3.1*   No results for input(s): LIPASE, AMYLASE in the last 168 hours. No results for input(s): AMMONIA in the last 168 hours. CBC:  Recent Labs Lab 11/18/15 1827 11/19/15 0058 11/21/15 0402 11/22/15 0213  WBC 14.4* 20.7* 15.7*  --   NEUTROABS 13.0* 20.1*  --   --   HGB 9.2* 8.4* 8.2* 10.0*  HCT 31.7* 28.7* 27.2* 32.8*  MCV 89.8 88.6 87.7  --   PLT 388 375 385  --    Cardiac Enzymes: No results for input(s): CKTOTAL, CKMB, CKMBINDEX, TROPONINI in the last 168 hours. BNP (last 3 results)  Recent Labs  10/20/15 2215 11/19/15 0058 11/21/15 0402  BNP 60.1 90.0 284.2*    ProBNP (last 3 results) No results for input(s): PROBNP in the last 8760 hours.  CBG:  Recent Labs Lab 11/21/15 1156 11/21/15 1637 11/21/15 2154 11/22/15 0654 11/22/15 1143  GLUCAP 194* 250* 244* 207* 252*    Recent Results (from the past 240 hour(s))  MRSA PCR Screening     Status: None   Collection Time: 11/19/15 12:47 AM  Result Value Ref Range Status   MRSA by PCR NEGATIVE NEGATIVE Final    Comment:  The GeneXpert MRSA Assay (FDA approved for NASAL specimens only), is one component of a comprehensive MRSA colonization surveillance program. It is not intended to diagnose MRSA infection nor to guide or monitor treatment for MRSA infections.      Studies: Dg Chest Port 1 View  11/22/2015  CLINICAL DATA:  Chest tube. EXAM: PORTABLE CHEST 1 VIEW COMPARISON:  11/21/2015.  11/20/2015. FINDINGS: Left chest tube in stable position. No pneumothorax. Mediastinum and hilar structures normal. Stable cardiomegaly. Low lung volumes with mild bibasilar atelectasis. Small left pleural effusion. Stable left chest wall subcutaneous emphysema. IMPRESSION: 1. Left chest tube in stable position. No pneumothorax. Stable small  left pleural effusion. Stable left chest wall subcutaneous emphysema. 2. Low lung volumes with bibasilar atelectasis. 3. Stable cardiomegaly. Electronically Signed   By: Maisie Fushomas  Register   On: 11/22/2015 07:33   Dg Chest Port 1 View  11/21/2015  CLINICAL DATA:  Pneumothorax, history acute respiratory failure, COPD, CHF, hypertension, smoking EXAM: PORTABLE CHEST 1 VIEW COMPARISON:  Portable exam 0752 hours compared to 11/20/2015 FINDINGS: LEFT thoracostomy tube stable. Enlargement of cardiac silhouette. Atherosclerotic calcification aorta. Mediastinal contours and pulmonary vascularity normal. Emphysematous changes without infiltrate, pleural effusion or pneumothorax. Minimal bibasilar atelectasis. No acute osseous findings. Emphysema lower lateral LEFT chest wall again noted. IMPRESSION: COPD changes with minimal bibasilar atelectasis. Enlargement of cardiac silhouette. Mild LEFT thoracostomy tube without pneumothorax. Electronically Signed   By: Ulyses SouthwardMark  Boles M.D.   On: 11/21/2015 09:03    Scheduled Meds: . apixaban  5 mg Oral BID  . arformoterol  15 mcg Nebulization BID  . diltiazem  300 mg Oral Daily  . feeding supplement (GLUCERNA SHAKE)  237 mL Oral TID BM  . furosemide  40 mg Oral Daily  . guaiFENesin  600 mg Oral BID  . insulin aspart  0-9 Units Subcutaneous TID WC  . ipratropium-albuterol  3 mL Nebulization QID  . loratadine  10 mg Oral Daily  . methylPREDNISolone (SOLU-MEDROL) injection  60 mg Intravenous BID  . mometasone-formoterol  2 puff Inhalation BID  . pantoprazole  40 mg Oral Daily  . senna-docusate  2 tablet Oral BID  . sodium chloride flush  3 mL Intravenous Q12H  . tiotropium  18 mcg Inhalation Daily   Continuous Infusions:   Time spent: 25 minutes  Amond Speranza L  [facility] [email or web address], password [email or web address] 11/22/2015, 2:35 PM  LOS: 4 days

## 2015-11-23 ENCOUNTER — Inpatient Hospital Stay (HOSPITAL_COMMUNITY): Payer: Medicare Other

## 2015-11-23 LAB — GLUCOSE, CAPILLARY
GLUCOSE-CAPILLARY: 177 mg/dL — AB (ref 65–99)
GLUCOSE-CAPILLARY: 258 mg/dL — AB (ref 65–99)
Glucose-Capillary: 230 mg/dL — ABNORMAL HIGH (ref 65–99)
Glucose-Capillary: 318 mg/dL — ABNORMAL HIGH (ref 65–99)

## 2015-11-23 MED ORDER — DIGOXIN 0.25 MG/ML IJ SOLN
0.2500 mg | Freq: Four times a day (QID) | INTRAMUSCULAR | Status: AC
  Administered 2015-11-23 (×3): 0.25 mg via INTRAVENOUS
  Filled 2015-11-23 (×3): qty 2

## 2015-11-23 MED ORDER — METHYLPREDNISOLONE SODIUM SUCC 125 MG IJ SOLR
60.0000 mg | Freq: Two times a day (BID) | INTRAMUSCULAR | Status: DC
  Administered 2015-11-23 – 2015-11-25 (×5): 60 mg via INTRAVENOUS
  Filled 2015-11-23 (×5): qty 2

## 2015-11-23 MED ORDER — DIGOXIN 125 MCG PO TABS
0.1250 mg | ORAL_TABLET | Freq: Every day | ORAL | Status: DC
  Administered 2015-11-24 – 2015-11-25 (×2): 0.125 mg via ORAL
  Filled 2015-11-23 (×2): qty 1

## 2015-11-23 MED ORDER — INSULIN GLARGINE 100 UNIT/ML ~~LOC~~ SOLN
5.0000 [IU] | Freq: Every day | SUBCUTANEOUS | Status: DC
  Administered 2015-11-23: 5 [IU] via SUBCUTANEOUS
  Filled 2015-11-23 (×2): qty 0.05

## 2015-11-23 NOTE — Progress Notes (Signed)
      [address]       [place] [id number]             [phone]      Subjective:  Ms. Brenda Wells is receiving a breathing treatment this morning.  She is short of breath.  Objective: Vital signs in last 24 hours: Temp:  [97.9 F (36.6 C)-98.1 F (36.7 C)] 97.9 F (36.6 C) (03/07 0518) Pulse Rate:  [83-101] 83 (03/07 0726) Cardiac Rhythm:  [-] Normal sinus rhythm (03/06 1900) Resp:  [13-21] 20 (03/07 0726) BP: (108-130)/(48-68) 108/60 mmHg (03/07 0518) SpO2:  [93 %-98 %] 97 % (03/07 0726) Weight:  [141 lb 9.6 oz (64.229 kg)] 141 lb 9.6 oz (64.229 kg) (03/07 0649)  Intake/Output from previous day: 03/06 0701 - 03/07 0700 In: 483 [P.O.:480; I.V.:3] Out: 4501 [Urine:4500; Stool:1]  General appearance: alert, cooperative and no distress Heart: irregularly irregular rhythm Lungs: wheezes mild scattered Wound: clean and dry  Lab Results:  Recent Labs  11/21/15 0402 11/22/15 0213  WBC 15.7*  --   HGB 8.2* 10.0*  HCT 27.2* 32.8*  PLT 385  --    BMET:  Recent Labs  11/21/15 0402  NA 137  K 4.7  CL 93*  CO2 32  GLUCOSE 229*  BUN 30*  CREATININE 0.90  CALCIUM 8.9    PT/INR: No results for input(s): LABPROT, INR in the last 72 hours. ABG    Component Value Date/Time   PHART 7.457* 10/20/2015 2348   HCO3 38.5* 10/20/2015 2348   TCO2 40 10/20/2015 2348   O2SAT 99.0 10/20/2015 2348   CBG (last 3)   Recent Labs  11/22/15 1143 11/22/15 1642 11/23/15 0648  GLUCAP 252* 248* 177*    Assessment/Plan:  1. Chest tube- removed yesterday, CXR shows no pneumothorax 2. Dispo- patient stable, no pneumothorax, f/u appointment made... dispo per primary   LOS: 5 days    Wylie Russon, Denny PeonRIN 11/23/2015

## 2015-11-23 NOTE — Progress Notes (Signed)
[facility] PROGRESS NOTE  Brenda Wells Brenda Wells DOB: [birth year] DOA: 11/18/2015 PCP: [name],[name], MD  Summary 75 year old female with severe COPD on hospice at home, atrial fibrillation, DM admitted with pneumothorax. Chest tube placed.  Assessment/Plan:  Principal Problem:   Pneumothorax on left: chest tube out.  Active Problems:   COPD exacerbation (HCC): extremely dyspneic after working with PT. Will change pred to solumedrol. Not stable for discharge. Morphine prn severe dyspnea   Chronic atrial fibrillation (HCC) RVR: have increased cardizem. Transfused a unit of blood. Still tachycardic. Add digoxin. No amiodaone due to lung disease   Chronic anticoagulation - Coumadin, CHADS2VASC=4 on eliquis   Chronic diastolic (congestive) heart failure (HCC): Lasix resumed. BNP only 200 and chest x-ray without pulmonary edema   DM. SSI   DNR (do not resuscitate): hospice at home   Normocytic anemia: received 1 unit prbc   Acute on chronic respiratory failure (HCC)   Lactic acidosis: no evidence of infection. resolved. Could be from metformin. Would not resume at discharge   Code Status:  dnr Family Communication:  None avail Disposition Plan:  Home 1-2 days if dyspnea improved   HPI/Subjective: C/o severe dyspnea after working with PT  Objective: Filed Vitals:   11/23/15 1021 11/23/15 1024  BP:  132/50  Pulse: 180   Temp:    Resp:      Intake/Output Summary (Last 24 hours) at 11/23/15 1029 Last data filed at 11/23/15 0650  Gross per 24 hour  Intake    243 ml  Output   4501 ml  Net  -4258 ml   Filed Weights   11/21/15 0554 11/22/15 0518 11/23/15 0649  Weight: 63.413 kg (139 lb 12.8 oz) 62.687 kg (138 lb 3.2 oz) 64.229 kg (141 lb 9.6 oz)   Tele a fib rate periodically up to 180,  Exam:   General:  Flushed, uncomfortable, respiratory distress  Cardiovascular: irreg irreg, fast  Respiratory: tight. No WRR  Abdomen: s, nt, nd  Ext: no  CCE  Basic Metabolic Panel:  Recent Labs Lab 11/18/15 1827 11/19/15 0058 11/21/15 0402  NA 140 136 137  K 4.8 4.8 4.7  CL 95* 94* 93*  CO2 30 28 32  GLUCOSE 185* 260* 229*  BUN 23* 22* 30*  CREATININE 0.96 0.88 0.90  CALCIUM 8.8* 8.3* 8.9   Liver Function Tests:  Recent Labs Lab 11/18/15 1827  AST 23  ALT 19  ALKPHOS 60  BILITOT 0.2*  PROT 5.6*  ALBUMIN 3.1*   No results for input(s): LIPASE, AMYLASE in the last 168 hours. No results for input(s): AMMONIA in the last 168 hours. CBC:  Recent Labs Lab 11/18/15 1827 11/19/15 0058 11/21/15 0402 11/22/15 0213  WBC 14.4* 20.7* 15.7*  --   NEUTROABS 13.0* 20.1*  --   --   HGB 9.2* 8.4* 8.2* 10.0*  HCT 31.7* 28.7* 27.2* 32.8*  MCV 89.8 88.6 87.7  --   PLT 388 375 385  --    Cardiac Enzymes: No results for input(s): CKTOTAL, CKMB, CKMBINDEX, TROPONINI in the last 168 hours. BNP (last 3 results)  Recent Labs  10/20/15 2215 11/19/15 0058 11/21/15 0402  BNP 60.1 90.0 284.2*    ProBNP (last 3 results) No results for input(s): PROBNP in the last 8760 hours.  CBG:  Recent Labs Lab 11/21/15 2154 11/22/15 0654 11/22/15 1143 11/22/15 1642 11/23/15 0648  GLUCAP 244* 207* 252* 248* 177*    Recent Results (from the past 240 hour(s))  MRSA PCR  Screening     Status: None   Collection Time: 11/19/15 12:47 AM  Result Value Ref Range Status   MRSA by PCR NEGATIVE NEGATIVE Final    Comment:        The GeneXpert MRSA Assay (FDA approved for NASAL specimens only), is one component of a comprehensive MRSA colonization surveillance program. It is not intended to diagnose MRSA infection nor to guide or monitor treatment for MRSA infections.      Studies: Dg Chest 2 View  11/23/2015  CLINICAL DATA:  Cough, shortness of breath, pneumonia for a few days, history of chest tube placement, COPD with acute exacerbation, CHF, hypertension, smoker EXAM: CHEST  2 VIEW COMPARISON:  11/22/2015 FINDINGS: Interval  removal of LEFT thoracostomy tube. Enlargement of cardiac silhouette. Atherosclerotic calcification aorta. Minimal pulmonary vascular congestion. Emphysematous and minimal bronchitic changes consistent with COPD. Subsegmental atelectasis LEFT upper lobe. Mild atelectasis versus infiltrate LEFT lower lobe. Minimal RIGHT basilar atelectasis. Calcified granuloma LEFT upper lobe. Skin fold projects over LEFT chest. Questionable tiny LEFT pleural effusion. No pneumothorax. Mild chest wall emphysema lower lateral LEFT chest. IMPRESSION: COPD changes with scattered atelectasis and question minimal LEFT lower lobe infiltrate as above. No pneumothorax following LEFT thoracostomy tube removal. Electronically Signed   By: Ulyses Southward M.D.   On: 11/23/2015 08:08   Dg Chest Port 1 View  11/22/2015  CLINICAL DATA:  Chest tube. EXAM: PORTABLE CHEST 1 VIEW COMPARISON:  11/21/2015.  11/20/2015. FINDINGS: Left chest tube in stable position. No pneumothorax. Mediastinum and hilar structures normal. Stable cardiomegaly. Low lung volumes with mild bibasilar atelectasis. Small left pleural effusion. Stable left chest wall subcutaneous emphysema. IMPRESSION: 1. Left chest tube in stable position. No pneumothorax. Stable small left pleural effusion. Stable left chest wall subcutaneous emphysema. 2. Low lung volumes with bibasilar atelectasis. 3. Stable cardiomegaly. Electronically Signed   By: Maisie Fus  Register   On: 11/22/2015 07:33    Scheduled Meds: . apixaban  5 mg Oral BID  . arformoterol  15 mcg Nebulization BID  . digoxin  0.25 mg Intravenous Q6H  . [START ON 11/24/2015] digoxin  0.125 mg Oral Daily  . diltiazem  300 mg Oral Daily  . feeding supplement (GLUCERNA SHAKE)  237 mL Oral TID BM  . furosemide  40 mg Oral Daily  . guaiFENesin  600 mg Oral BID  . insulin aspart  0-9 Units Subcutaneous TID WC  . ipratropium-albuterol  3 mL Nebulization QID  . loratadine  10 mg Oral Daily  . methylPREDNISolone (SOLU-MEDROL)  injection  60 mg Intravenous Q12H  . mometasone-formoterol  2 puff Inhalation BID  . pantoprazole  40 mg Oral Daily  . senna-docusate  2 tablet Oral BID  . sodium chloride flush  3 mL Intravenous Q12H  . tiotropium  18 mcg Inhalation Daily   Continuous Infusions:   Time spent: 25 minutes  Gwynevere Lizana L  [facility] [email or web address], password [facility] 11/23/2015, 10:29 AM  LOS: 5 days

## 2015-11-23 NOTE — Progress Notes (Signed)
Physical Therapy Treatment Patient Details Name: Brenda Wells MRN: [id number] DOB: 06-07-1941 Today's Date: 11/23/2015    History of Present Illness Patient is a 75 y.o. female with hx of COPD on 4L 02, chronic A-fib, acute respiratory failure, anemia, back surgery, hypokalemia, hyponatremia, chronic diastolic CHF and HTN presented with dyspnea at rest. Pt is chronically ill, receiving hospice services for advanced COPD. CXR- demonstrates a 90% pneumothorax on the left side s/p chest tube placement and successful reexpansion of the lung.     PT Comments    Pt performed increased gait distance.  SPO2 remains 97% on 3L with activity.  HR during gait 122-144 bpm, post ambulation HR elevated to 160s-180 bpm.  RN informed.    Follow Up Recommendations  No PT follow up;Supervision/Assistance - 24 hour     Equipment Recommendations  None recommended by PT    Recommendations for Other Services       Precautions / Restrictions Precautions Precautions: Fall Precaution Comments: watch HR Restrictions Weight Bearing Restrictions: No    Mobility  Bed Mobility Overal bed mobility: Modified Independent Bed Mobility: Supine to Sit     Supine to sit: Modified independent (Device/Increase time);HOB elevated     General bed mobility comments: No assist needed. Use of rail.  Transfers Overall transfer level: Modified independent Equipment used: Rolling walker (2 wheeled) Transfers: Sit to/from Stand Sit to Stand: Supervision Stand pivot transfers: Supervision       General transfer comment: Supervision for safety, cues for hand placement to and from seated surface.    Ambulation/Gait Ambulation/Gait assistance: Min guard;Supervision Ambulation Distance (Feet): 160 Feet Assistive device: Rolling walker (2 wheeled) Gait Pattern/deviations: WFL(Within Functional Limits);Step-through pattern;Decreased stride length;Trunk flexed Gait velocity: decreased   General Gait Details: Gait  remains slow and steady HR 123-144 during gait training, post gait training HR elevated to 160s-180s.     Stairs            Wheelchair Mobility    Modified Rankin (Stroke Patients Only)       Balance Overall balance assessment: Needs assistance   Sitting balance-Leahy Scale: Good       Standing balance-Leahy Scale: Fair                      Cognition Arousal/Alertness: Awake/alert Behavior During Therapy: WFL for tasks assessed/performed Overall Cognitive Status: Within Functional Limits for tasks assessed                      Exercises      General Comments        Pertinent Vitals/Pain Pain Assessment: No/denies pain    Home Living                      Prior Function            PT Goals (current goals can now be found in the care plan section) Acute Rehab PT Goals Patient Stated Goal: to do what I can do Potential to Achieve Goals: Good Progress towards PT goals: Progressing toward goals    Frequency  Min 3X/week    PT Plan Current plan remains appropriate    Co-evaluation             End of Session Equipment Utilized During Treatment: Oxygen;Gait belt Activity Tolerance: Patient tolerated treatment well Patient left: in chair;with call bell/phone within reach     Time: [id number] PT Time Calculation (min) (ACUTE ONLY): 15  min  Charges:  $Gait Training: 8-22 mins                    G Codes:      Florestine Avers December 08, 2015, 10:27 AM Joycelyn Rua, PTA pager [phone]

## 2015-11-24 DIAGNOSIS — D72829 Elevated white blood cell count, unspecified: Secondary | ICD-10-CM

## 2015-11-24 DIAGNOSIS — I5032 Chronic diastolic (congestive) heart failure: Secondary | ICD-10-CM

## 2015-11-24 DIAGNOSIS — E872 Acidosis: Secondary | ICD-10-CM

## 2015-11-24 DIAGNOSIS — J9621 Acute and chronic respiratory failure with hypoxia: Secondary | ICD-10-CM

## 2015-11-24 LAB — BASIC METABOLIC PANEL
ANION GAP: 12 (ref 5–15)
BUN: 34 mg/dL — ABNORMAL HIGH (ref 6–20)
CHLORIDE: 91 mmol/L — AB (ref 101–111)
CO2: 33 mmol/L — ABNORMAL HIGH (ref 22–32)
Calcium: 8.4 mg/dL — ABNORMAL LOW (ref 8.9–10.3)
Creatinine, Ser: 1.04 mg/dL — ABNORMAL HIGH (ref 0.44–1.00)
GFR calc Af Amer: 60 mL/min — ABNORMAL LOW (ref >60)
GFR, EST NON AFRICAN AMERICAN: 52 mL/min — AB (ref >60)
GLUCOSE: 295 mg/dL — AB (ref 65–99)
POTASSIUM: 4.5 mmol/L (ref 3.5–5.1)
Sodium: 136 mmol/L (ref 135–145)

## 2015-11-24 LAB — GLUCOSE, CAPILLARY
GLUCOSE-CAPILLARY: 239 mg/dL — AB (ref 65–99)
GLUCOSE-CAPILLARY: 223 mg/dL — AB (ref 65–99)
Glucose-Capillary: 229 mg/dL — ABNORMAL HIGH (ref 65–99)
Glucose-Capillary: 178 mg/dL — ABNORMAL HIGH (ref 65–99)

## 2015-11-24 LAB — MAGNESIUM: Magnesium: 2.1 mg/dL (ref 1.7–2.4)

## 2015-11-24 MED ORDER — INSULIN GLARGINE 100 UNIT/ML ~~LOC~~ SOLN
10.0000 [IU] | Freq: Every day | SUBCUTANEOUS | Status: DC
  Administered 2015-11-24: 10 [IU] via SUBCUTANEOUS
  Filled 2015-11-24 (×2): qty 0.1

## 2015-11-24 MED ORDER — GLIPIZIDE 5 MG PO TABS
5.0000 mg | ORAL_TABLET | Freq: Every day | ORAL | Status: DC
  Administered 2015-11-24 – 2015-11-25 (×2): 5 mg via ORAL
  Filled 2015-11-24 (×2): qty 1

## 2015-11-24 NOTE — Progress Notes (Signed)
PATIENT DETAILS Name: Brenda Wells Age: 75 y.o. Sex: female Date of Birth: [birth year] Admit Date: 11/18/2015 Admitting Physician Briscoe Deutscherimothy S Opyd, MD UJW:JXBJYN,WGNFAOZHYPCP:[name],[name], MD  Subjective: Less shortness of breath today.  Assessment/Plan: Principal Problem: Spontaneous  Pneumothorax on left: Underwent chest tube placement on admission, chest tube removed on 3/6. Repeat chest x-ray on 3/7, shows no further pneumothorax. Cardiothoracic surgery following.  Active Problems: COPD exacerbation: Feels much better, continue Solu-Medrol, bronchodilators. Suspect if clinically improvement continues, should be able to be discharged home tomorrow. Note-patient has severe COPD on 3 L of oxygen at baseline, being followed at home by hospice.  Acute on chronic hypoxic respiratory failure: Acute respiratory failure secondary to above, on 3 L of oxygen at home.  Atrial fibrillation with RVR: Rate much better at rest, continues to have mild tachycardia with exertion. Continue Cardizem, digoxin added yesterday.CHADS2VASC=4 on eliquis Follow.   Chronic diastolic heart failure: Seems to be compensated, suspect shortness of breath is due to pulmonary issues. Continue Lasix.  Type 2 diabetes: CBGs worsened due to IV steroids, continue SSI and low-dose Lantus while inpatient, will start glipizide-we'll not plan to resume metformin on discharge given lactic acidosis on admission.   Anemia: Suspect secondary to chronic disease, no evidence of blood loss.  Transfused 1 unit of blood so far during this hospitalization.  Leukocytosis: Likely secondary to steroids, no indication of infection at this time.   Lactic acidosis: no evidence of infection. resolved. Could be from metformin. Will not resume at discharge  Disposition: Remain inpatient-home with hospice in the next 1-2 days  Antimicrobial agents  See below  Anti-infectives    None      DVT Prophylaxis: Eliquis  Code Status:  DNR  Family Communication None at bedside  Procedures: None  CONSULTS:  None  Time spent 30 minutes-Greater than 50% of this time was spent in counseling, explanation of diagnosis, planning of further management, and coordination of care.  MEDICATIONS: Scheduled Meds: . apixaban  5 mg Oral BID  . arformoterol  15 mcg Nebulization BID  . digoxin  0.125 mg Oral Daily  . diltiazem  300 mg Oral Daily  . feeding supplement (GLUCERNA SHAKE)  237 mL Oral TID BM  . furosemide  40 mg Oral Daily  . guaiFENesin  600 mg Oral BID  . insulin aspart  0-9 Units Subcutaneous TID WC  . insulin glargine  5 Units Subcutaneous QHS  . ipratropium-albuterol  3 mL Nebulization QID  . loratadine  10 mg Oral Daily  . methylPREDNISolone (SOLU-MEDROL) injection  60 mg Intravenous Q12H  . mometasone-formoterol  2 puff Inhalation BID  . pantoprazole  40 mg Oral Daily  . senna-docusate  2 tablet Oral BID  . sodium chloride flush  3 mL Intravenous Q12H  . tiotropium  18 mcg Inhalation Daily   Continuous Infusions:  PRN Meds:.sodium chloride, ipratropium-albuterol, morphine injection, ondansetron **OR** ondansetron (ZOFRAN) IV, oxyCODONE, polyethylene glycol, sodium chloride flush, zolpidem    PHYSICAL EXAM: Vital signs in last 24 hours: Filed Vitals:   11/23/15 2029 11/24/15 0420 11/24/15 0748 11/24/15 0908  BP: 119/49 129/59    Pulse: 94 79    Temp: 97.6 F (36.4 C) 98.2 F (36.8 C)    TempSrc: Oral Oral    Resp: 18 18    Height:      Weight:  63.05 kg (139 lb)    SpO2: 98% 98% 98% 98%  Weight change: -1.179 kg (-2 lb 9.6 oz) Filed Weights   11/22/15 0518 11/23/15 0649 11/24/15 0420  Weight: 62.687 kg (138 lb 3.2 oz) 64.229 kg (141 lb 9.6 oz) 63.05 kg (139 lb)   Body mass index is 23.13 kg/(m^2).   Gen Exam: Awake and alert with clear speech. Neck: Supple, No JVD.   Chest: Decreased air entry at bases-but moving air well otherwise-a few scattered rhonchi. CVS: S1 S2 Regular, no  murmurs.  Abdomen: soft, BS +, non tender, non distended.  Extremities: trace edema, lower extremities warm to touch. Neurologic: Non Focal-but with generalized weakness.   Skin: No Rash.   Wounds: N/A.    Intake/Output from previous day:  Intake/Output Summary (Last 24 hours) at 11/24/15 1121 Last data filed at 11/24/15 0800  Gross per 24 hour  Intake    480 ml  Output   4101 ml  Net  -3621 ml     LAB RESULTS: CBC  Recent Labs Lab 11/18/15 1827 11/19/15 0058 11/21/15 0402 11/22/15 0213  WBC 14.4* 20.7* 15.7*  --   HGB 9.2* 8.4* 8.2* 10.0*  HCT 31.7* 28.7* 27.2* 32.8*  PLT 388 375 385  --   MCV 89.8 88.6 87.7  --   MCH 26.1 25.9* 26.5  --   MCHC 29.0* 29.3* 30.1  --   RDW 22.5* 22.1* 22.5*  --   LYMPHSABS 1.0 0.4*  --   --   MONOABS 0.4 0.2  --   --   EOSABS 0.0 0.0  --   --   BASOSABS 0.0 0.0  --   --     Chemistries   Recent Labs Lab 11/18/15 1827 11/19/15 0058 11/21/15 0402 11/24/15 0250  NA 140 136 137 136  K 4.8 4.8 4.7 4.5  CL 95* 94* 93* 91*  CO2 30 28 32 33*  GLUCOSE 185* 260* 229* 295*  BUN 23* 22* 30* 34*  CREATININE 0.96 0.88 0.90 1.04*  CALCIUM 8.8* 8.3* 8.9 8.4*  MG  --   --   --  2.1    CBG:  Recent Labs Lab 11/23/15 0648 11/23/15 1112 11/23/15 1645 11/23/15 2054 11/24/15 0554  GLUCAP 177* 258* 230* 318* 229*    GFR Estimated Creatinine Clearance: 42.7 mL/min (by C-G formula based on Cr of 1.04).  Coagulation profile No results for input(s): INR, PROTIME in the last 168 hours.  Cardiac Enzymes No results for input(s): CKMB, TROPONINI, MYOGLOBIN in the last 168 hours.  Invalid input(s): CK  Invalid input(s): POCBNP No results for input(s): DDIMER in the last 72 hours. No results for input(s): HGBA1C in the last 72 hours. No results for input(s): CHOL, HDL, LDLCALC, TRIG, CHOLHDL, LDLDIRECT in the last 72 hours. No results for input(s): TSH, T4TOTAL, T3FREE, THYROIDAB in the last 72 hours.  Invalid input(s):  FREET3 No results for input(s): VITAMINB12, FOLATE, FERRITIN, TIBC, IRON, RETICCTPCT in the last 72 hours. No results for input(s): LIPASE, AMYLASE in the last 72 hours.  Urine Studies No results for input(s): UHGB, CRYS in the last 72 hours.  Invalid input(s): UACOL, UAPR, USPG, UPH, UTP, UGL, UKET, UBIL, UNIT, UROB, ULEU, UEPI, UWBC, URBC, UBAC, CAST, UCOM, BILUA  MICROBIOLOGY: Recent Results (from the past 240 hour(s))  MRSA PCR Screening     Status: None   Collection Time: 11/19/15 12:47 AM  Result Value Ref Range Status   MRSA by PCR NEGATIVE NEGATIVE Final    Comment:        The GeneXpert  MRSA Assay (FDA approved for NASAL specimens only), is one component of a comprehensive MRSA colonization surveillance program. It is not intended to diagnose MRSA infection nor to guide or monitor treatment for MRSA infections.     RADIOLOGY STUDIES/RESULTS: Dg Chest 2 View  11/23/2015  CLINICAL DATA:  Cough, shortness of breath, pneumonia for a few days, history of chest tube placement, COPD with acute exacerbation, CHF, hypertension, smoker EXAM: CHEST  2 VIEW COMPARISON:  11/22/2015 FINDINGS: Interval removal of LEFT thoracostomy tube. Enlargement of cardiac silhouette. Atherosclerotic calcification aorta. Minimal pulmonary vascular congestion. Emphysematous and minimal bronchitic changes consistent with COPD. Subsegmental atelectasis LEFT upper lobe. Mild atelectasis versus infiltrate LEFT lower lobe. Minimal RIGHT basilar atelectasis. Calcified granuloma LEFT upper lobe. Skin fold projects over LEFT chest. Questionable tiny LEFT pleural effusion. No pneumothorax. Mild chest wall emphysema lower lateral LEFT chest. IMPRESSION: COPD changes with scattered atelectasis and question minimal LEFT lower lobe infiltrate as above. No pneumothorax following LEFT thoracostomy tube removal. Electronically Signed   By: Ulyses Southward M.D.   On: 11/23/2015 08:08   Dg Chest Port 1 View  11/22/2015  CLINICAL  DATA:  Chest tube. EXAM: PORTABLE CHEST 1 VIEW COMPARISON:  11/21/2015.  11/20/2015. FINDINGS: Left chest tube in stable position. No pneumothorax. Mediastinum and hilar structures normal. Stable cardiomegaly. Low lung volumes with mild bibasilar atelectasis. Small left pleural effusion. Stable left chest wall subcutaneous emphysema. IMPRESSION: 1. Left chest tube in stable position. No pneumothorax. Stable small left pleural effusion. Stable left chest wall subcutaneous emphysema. 2. Low lung volumes with bibasilar atelectasis. 3. Stable cardiomegaly. Electronically Signed   By: Maisie Fus  Register   On: 11/22/2015 07:33   Dg Chest Port 1 View  11/21/2015  CLINICAL DATA:  Pneumothorax, history acute respiratory failure, COPD, CHF, hypertension, smoking EXAM: PORTABLE CHEST 1 VIEW COMPARISON:  Portable exam 0752 hours compared to 11/20/2015 FINDINGS: LEFT thoracostomy tube stable. Enlargement of cardiac silhouette. Atherosclerotic calcification aorta. Mediastinal contours and pulmonary vascularity normal. Emphysematous changes without infiltrate, pleural effusion or pneumothorax. Minimal bibasilar atelectasis. No acute osseous findings. Emphysema lower lateral LEFT chest wall again noted. IMPRESSION: COPD changes with minimal bibasilar atelectasis. Enlargement of cardiac silhouette. Mild LEFT thoracostomy tube without pneumothorax. Electronically Signed   By: Ulyses Southward M.D.   On: 11/21/2015 09:03   Dg Chest Port 1 View  11/20/2015  CLINICAL DATA:  Left sided chest tube placement Hx of CHF, HTN, COPD, anemia EXAM: PORTABLE CHEST - 1 VIEW COMPARISON:  the previous day's study FINDINGS: Left chest tube remains in place, directed towards the suprahilar region. Trace apical pneumothorax, lung apex projecting just inferior to the posterior aspect of the second rib. Lateral subcutaneous emphysema persists. Stable moderate cardiomegaly. Atheromatous aorta. Mild perihilar scratch the coarse perihilar interstitial markings  as before. Probable small left pleural effusion. Stable patchy atelectasis or infiltrate at the left lung base. Visualized skeletal structures are unremarkable. IMPRESSION: 1. Stable left chest tube with tiny apical pneumothorax. Electronically Signed   By: Corlis Leak M.D.   On: 11/20/2015 09:44   Dg Chest Port 1 View  11/19/2015  CLINICAL DATA:  Respiratory failure EXAM: PORTABLE CHEST 1 VIEW COMPARISON:  11/26/2015 FINDINGS: Stable left chest tube. Stable cardiomegaly. Small left pleural effusion and a stable. Subsegmental atelectasis has increased at the left base. Opacity at the medial left apex has improved. Right lung remains hyperaerated and clear. IMPRESSION: Improved opacity at the left apex. Small left pleural effusion and basilar atelectasis increased. No pneumothorax. Electronically  Signed   By: Jolaine Click M.D.   On: 11/19/2015 16:32   Dg Chest Port 1 View  11/18/2015  CLINICAL DATA:  75 year old female status post placement of a left-sided chest tube. EXAM: PORTABLE CHEST 1 VIEW COMPARISON:  Radiograph dated 11/18/2015 FINDINGS: There has been interval placement of a left-sided chest tube the tip over the left apical pleural surface. There has been interval re-expansion of the left lung. No definite pneumothorax identified on the current study. There is a focal area of opacity in the left upper lung field abutting the aortic arch compatible with an area of atelectasis. The right lung is clear. There is coarsened interstitial markings which may represent underlying COPD. There is no pleural effusion. Mild cardiac enlargement. No acute osseous pathology. IMPRESSION: Interval placement of a left-sided chest tube with insert interval re-expansion of the left lung. No definite residual pneumothorax identified. Electronically Signed   By: Elgie Collard M.D.   On: 11/18/2015 19:41   Dg Chest Portable 1 View  11/18/2015  CLINICAL DATA:  Dyspnea today. Pt c/o not being able to catch her breath at all.  Hx of CHF, HTN, COPD with acute exacerbation, acute respiratory failure. EXAM: PORTABLE CHEST 1 VIEW COMPARISON:  10/20/2015 FINDINGS: Large left pneumothorax.  There is no mediastinal shift. Right lung is clear. Cardiac silhouette is mildly enlarged. No mediastinal or hilar masses. IMPRESSION: 1. Large left pneumothorax. Critical Value/emergent results were called by telephone at the time of interpretation on 11/18/2015 at 6:12 pm to Dr. Pricilla Loveless , who verbally acknowledged these results. Electronically Signed   By: Amie Portland M.D.   On: 11/18/2015 18:12    Jeoffrey Massed, MD  [facility] Pager:[phone]  If 7PM-7AM, please contact night-coverage [email or web address] Password [email or web address] 11/24/2015, 11:21 AM   LOS: 6 days

## 2015-11-25 DIAGNOSIS — I4891 Unspecified atrial fibrillation: Secondary | ICD-10-CM

## 2015-11-25 LAB — GLUCOSE, CAPILLARY
GLUCOSE-CAPILLARY: 146 mg/dL — AB (ref 65–99)
Glucose-Capillary: 192 mg/dL — ABNORMAL HIGH (ref 65–99)

## 2015-11-25 MED ORDER — GLUCERNA SHAKE PO LIQD: 237.0000 mL | Freq: Three times a day (TID) | ORAL | Status: AC

## 2015-11-25 MED ORDER — DILTIAZEM HCL ER COATED BEADS 180 MG PO CP24
360.0000 mg | ORAL_CAPSULE | Freq: Every day | ORAL | Status: DC
  Administered 2015-11-25: 360 mg via ORAL

## 2015-11-25 MED ORDER — ZOLPIDEM TARTRATE 5 MG PO TABS: 5.0000 mg | ORAL_TABLET | Freq: Every evening | ORAL | Status: AC | PRN

## 2015-11-25 MED ORDER — PREDNISONE 10 MG PO TABS: ORAL_TABLET | ORAL | Status: DC

## 2015-11-25 MED ORDER — DILTIAZEM HCL ER COATED BEADS 360 MG PO CP24: 360.0000 mg | ORAL_CAPSULE | Freq: Every day | ORAL | Status: DC

## 2015-11-25 MED ORDER — OXYCODONE HCL 5 MG PO TABS: 5.0000 mg | ORAL_TABLET | ORAL | Status: DC | PRN

## 2015-11-25 MED ORDER — GLIPIZIDE 5 MG PO TABS: 5.0000 mg | ORAL_TABLET | Freq: Every day | ORAL | Status: AC

## 2015-11-25 NOTE — Progress Notes (Signed)
Occupational Therapy Treatment and Discharge Patient Details Name: Brenda Wells MRN: [id number] DOB: 1941-02-03 Today's Date: 11/25/2015    History of present illness Patient is a 75 y.o. female with hx of COPD on 4L 02, chronic A-fib, acute respiratory failure, anemia, back surgery, hypokalemia, hyponatremia, chronic diastolic CHF and HTN presented with dyspnea at rest. Pt is chronically ill, receiving hospice services for advanced COPD. CXR- demonstrates a 90% pneumothorax on the left side s/p chest tube placement and successful reexpansion of the lung.    OT comments  Pt is without concerns about being able to manage at home, feels near her baseline.  Pt demonstrating toileting at a modified independent level and pt employs use of energy conservation strategies in ADL and mobility.  Follow Up Recommendations  No OT follow up;Supervision/Assistance - 24 hour    Equipment Recommendations  None recommended by OT    Recommendations for Other Services      Precautions / Restrictions Precautions Precautions: Fall Precaution Comments: watch HR Restrictions Weight Bearing Restrictions: No       Mobility Bed Mobility Overal bed mobility: Modified Independent             General bed mobility comments: Sitting EOB upon PT arrival.   Transfers Overall transfer level: Modified independent Equipment used: Rolling walker (2 wheeled) Transfers: Sit to/from Stand Sit to Stand: Modified independent (Device/Increase time)         General transfer comment: Stood from [employer], good demo of technique/safety.    Balance Overall balance assessment: Needs assistance Sitting-balance support: Feet supported;No upper extremity supported Sitting balance-Leahy Scale: Good     Standing balance support: During functional activity Standing balance-Leahy Scale: Fair Standing balance comment: Able to perform furniture walking within room with 1 UE support.                    ADL  Overall ADL's : Needs assistance/impaired     Grooming: Wash/dry hands;Brushing hair;Standing;Modified independent                   Toilet Transfer: Modified Independent;Ambulation;RW;Comfort height toilet   Toileting- Clothing Manipulation and Hygiene: Modified independent;Sit to/from stand       Functional mobility during ADLs: Modified independent;Rolling walker (within room) General ADL Comments: Pt uses pacing and sits frequently to conserve energy.      Vision                     Perception     Praxis      Cognition   Behavior During Therapy: WFL for tasks assessed/performed Overall Cognitive Status: Within Functional Limits for tasks assessed                       Extremity/Trunk Assessment               Exercises     Shoulder Instructions       General Comments      Pertinent Vitals/ Pain       Pain Assessment: No/denies pain  Home Living                                          Prior Functioning/Environment              Frequency       Progress Toward Goals  OT  Goals(current goals can now be found in the care plan section)  Progress towards OT goals: Goals met/education completed, patient discharged from OT  Acute Rehab OT Goals Patient Stated Goal: to do what I can do  Plan Discharge plan remains appropriate    Co-evaluation                 End of Session Equipment Utilized During Treatment: Oxygen;Rolling walker   Activity Tolerance Patient tolerated treatment well   Patient Left in bed;with call bell/phone within reach   Nurse Communication          Time: 0910-0930 OT Time Calculation (min): 20 min  Charges: OT General Charges $OT Visit: 1 Procedure OT Treatments $Self Care/Home Management : 8-22 mins  Malka So 11/25/2015, 11:52 AM  [phone]

## 2015-11-25 NOTE — Discharge Summary (Signed)
PATIENT DETAILS Name: Brenda Wells Age: 75 y.o. Sex: female Date of Birth: 1941/08/23 MRN: [id number]. Admitting Physician: Briscoe Deutscher, MD WUJ:[id number], MD  Admit Date: 11/18/2015 Discharge date: 11/25/2015  Recommendations for Outpatient Follow-up:  1.  Continue follow-up with home hospice.  PRIMARY DISCHARGE DIAGNOSIS:  Principal Problem:   Pneumothorax on left Active Problems:   COPD exacerbation (HCC)   Chronic atrial fibrillation (HCC)   Chronic anticoagulation - Coumadin, CHADS2VASC=4   Chronic diastolic (congestive) heart failure (HCC)   Steroid-induced hyperglycemia   DNR (do not resuscitate)   Normocytic anemia   Leukocytosis   Acute on chronic respiratory failure (HCC)   Chest tube in place   Lactic acidosis      PAST MEDICAL HISTORY: Past Medical History  Diagnosis Date  . Acute respiratory failure (HCC)   . Community acquired pneumonia   . COPD with acute exacerbation (HCC)   . Anemia   . Hypokalemia   . Hyponatremia   . CHF (congestive heart failure) (HCC)   . Hypertension   . Shortness of breath dyspnea   . History of kidney stones     DISCHARGE MEDICATIONS: Current Discharge Medication List    START taking these medications   Details  feeding supplement, GLUCERNA SHAKE, (GLUCERNA SHAKE) LIQD Take 237 mLs by mouth 3 (three) times daily between meals. Qty: 90 Can, Refills: 0    glipiZIDE (GLUCOTROL) 5 MG tablet Take 1 tablet (5 mg total) by mouth daily before breakfast. Qty: 30 tablet, Refills: 0    zolpidem (AMBIEN) 5 MG tablet Take 1 tablet (5 mg total) by mouth at bedtime as needed for sleep. Qty: 30 tablet, Refills: 0      CONTINUE these medications which have CHANGED   Details  diltiazem (CARDIZEM CD) 360 MG 24 hr capsule Take 1 capsule (360 mg total) by mouth daily. Qty: 30 capsule, Refills: 0    oxyCODONE (OXY IR/ROXICODONE) 5 MG immediate release tablet Take 1 tablet (5 mg total) by mouth every 4 (four) hours as  needed for moderate pain (dyspnea, SOB). Qty: 30 tablet, Refills: 0    predniSONE (DELTASONE) 10 MG tablet Prednisone dosing: Take  Prednisone  (4 tabs) x 3 days, then taper to  (3 tabs) x 3 days, then  (2 tabs) x 3days, then resume 10 mg daily as before  Dispense:  60 tabs, refills: None Qty: 60 tablet, Refills: 0      CONTINUE these medications which have NOT CHANGED   Details  albuterol (PROVENTIL) (2.5 MG/3ML) 0.083% nebulizer solution Take 3 mLs (2.5 mg total) by nebulization every 2 (two) hours as needed for wheezing or shortness of breath. Qty: 75 mL, Refills: 12    apixaban (ELIQUIS) 5 MG TABS tablet Take 1 tablet (5 mg total) by mouth 2 (two) times daily. Qty: 60 tablet, Refills: 2    arformoterol (BROVANA) 15 MCG/2ML NEBU Take 2 mLs (15 mcg total) by nebulization 2 (two) times daily. Qty: 120 mL, Refills: 0    Fluticasone-Salmeterol (ADVAIR) 500-50 MCG/DOSE AEPB Inhale 1 puff into the lungs 2 (two) times daily.    furosemide (LASIX) 40 MG tablet Take 1 tablet (40 mg total) by mouth daily. Qty: 30 tablet, Refills: 0    guaiFENesin (MUCINEX) 600 MG 12 hr tablet Take 1 tablet (600 mg total) by mouth 2 (two) times daily. Qty: 60 tablet, Refills: 0    ipratropium-albuterol (DUONEB) 0.5-2.5 (3) MG/3ML SOLN Take 3 mLs by nebulization 3 (three) times daily. Qty: 360  mL, Refills: 3    loratadine (CLARITIN) 10 MG tablet Take 10 mg by mouth daily.    omeprazole (PRILOSEC) 20 MG capsule Take 20 mg by mouth daily.    ondansetron (ZOFRAN) 4 MG tablet Take 4 mg by mouth every 6 (six) hours as needed for nausea or vomiting.  Refills: 0    sennosides-docusate sodium (SENOKOT-S) 8.6-50 MG tablet Take 2 tablets by mouth 2 (two) times daily.    SPIRIVA HANDIHALER 18 MCG inhalation capsule Place 18 mcg into inhaler and inhale daily.  Refills: 2      STOP taking these medications     diltiazem (CARDIZEM) 120 MG tablet      metFORMIN (GLUCOPHAGE) 500 MG tablet       levofloxacin (LEVAQUIN) 500 MG tablet         ALLERGIES:  No Known Allergies  BRIEF HPI:  See H&P, Labs, Consult and Test reports for all details in brief, is a 75 y.o. female with PMH of COPD with 4 L/m supplemental oxygen requirement around the clock, chronic atrial fibrillation on request, and chronic diastolic CHF who presented to the ED with acute onset dyspnea at rest. Chest x-ray showed a 90% pneumothorax on the left side.  CONSULTATIONS:   CTVS  PERTINENT RADIOLOGIC STUDIES: Dg Chest 2 View  11/23/2015  CLINICAL DATA:  Cough, shortness of breath, pneumonia for a few days, history of chest tube placement, COPD with acute exacerbation, CHF, hypertension, smoker EXAM: CHEST  2 VIEW COMPARISON:  11/22/2015 FINDINGS: Interval removal of LEFT thoracostomy tube. Enlargement of cardiac silhouette. Atherosclerotic calcification aorta. Minimal pulmonary vascular congestion. Emphysematous and minimal bronchitic changes consistent with COPD. Subsegmental atelectasis LEFT upper lobe. Mild atelectasis versus infiltrate LEFT lower lobe. Minimal RIGHT basilar atelectasis. Calcified granuloma LEFT upper lobe. Skin fold projects over LEFT chest. Questionable tiny LEFT pleural effusion. No pneumothorax. Mild chest wall emphysema lower lateral LEFT chest. IMPRESSION: COPD changes with scattered atelectasis and question minimal LEFT lower lobe infiltrate as above. No pneumothorax following LEFT thoracostomy tube removal. Electronically Signed   By: Ulyses Southward M.D.   On: 11/23/2015 08:08   Dg Chest Port 1 View  11/22/2015  CLINICAL DATA:  Chest tube. EXAM: PORTABLE CHEST 1 VIEW COMPARISON:  11/21/2015.  11/20/2015. FINDINGS: Left chest tube in stable position. No pneumothorax. Mediastinum and hilar structures normal. Stable cardiomegaly. Low lung volumes with mild bibasilar atelectasis. Small left pleural effusion. Stable left chest wall subcutaneous emphysema. IMPRESSION: 1. Left chest tube in stable position.  No pneumothorax. Stable small left pleural effusion. Stable left chest wall subcutaneous emphysema. 2. Low lung volumes with bibasilar atelectasis. 3. Stable cardiomegaly. Electronically Signed   By: Maisie Fus  Register   On: 11/22/2015 07:33   Dg Chest Port 1 View  11/21/2015  CLINICAL DATA:  Pneumothorax, history acute respiratory failure, COPD, CHF, hypertension, smoking EXAM: PORTABLE CHEST 1 VIEW COMPARISON:  Portable exam 0752 hours compared to 11/20/2015 FINDINGS: LEFT thoracostomy tube stable. Enlargement of cardiac silhouette. Atherosclerotic calcification aorta. Mediastinal contours and pulmonary vascularity normal. Emphysematous changes without infiltrate, pleural effusion or pneumothorax. Minimal bibasilar atelectasis. No acute osseous findings. Emphysema lower lateral LEFT chest wall again noted. IMPRESSION: COPD changes with minimal bibasilar atelectasis. Enlargement of cardiac silhouette. Mild LEFT thoracostomy tube without pneumothorax. Electronically Signed   By: Ulyses Southward M.D.   On: 11/21/2015 09:03   Dg Chest Port 1 View  11/20/2015  CLINICAL DATA:  Left sided chest tube placement Hx of CHF, HTN, COPD, anemia EXAM: PORTABLE  CHEST - 1 VIEW COMPARISON:  the previous day's study FINDINGS: Left chest tube remains in place, directed towards the suprahilar region. Trace apical pneumothorax, lung apex projecting just inferior to the posterior aspect of the second rib. Lateral subcutaneous emphysema persists. Stable moderate cardiomegaly. Atheromatous aorta. Mild perihilar scratch the coarse perihilar interstitial markings as before. Probable small left pleural effusion. Stable patchy atelectasis or infiltrate at the left lung base. Visualized skeletal structures are unremarkable. IMPRESSION: 1. Stable left chest tube with tiny apical pneumothorax. Electronically Signed   By: Corlis Leak M.D.   On: 11/20/2015 09:44   Dg Chest Port 1 View  11/19/2015  CLINICAL DATA:  Respiratory failure EXAM: PORTABLE  CHEST 1 VIEW COMPARISON:  11/26/2015 FINDINGS: Stable left chest tube. Stable cardiomegaly. Small left pleural effusion and a stable. Subsegmental atelectasis has increased at the left base. Opacity at the medial left apex has improved. Right lung remains hyperaerated and clear. IMPRESSION: Improved opacity at the left apex. Small left pleural effusion and basilar atelectasis increased. No pneumothorax. Electronically Signed   By: Jolaine Click M.D.   On: 11/19/2015 16:32   Dg Chest Port 1 View  11/18/2015  CLINICAL DATA:  75 year old female status post placement of a left-sided chest tube. EXAM: PORTABLE CHEST 1 VIEW COMPARISON:  Radiograph dated 11/18/2015 FINDINGS: There has been interval placement of a left-sided chest tube the tip over the left apical pleural surface. There has been interval re-expansion of the left lung. No definite pneumothorax identified on the current study. There is a focal area of opacity in the left upper lung field abutting the aortic arch compatible with an area of atelectasis. The right lung is clear. There is coarsened interstitial markings which may represent underlying COPD. There is no pleural effusion. Mild cardiac enlargement. No acute osseous pathology. IMPRESSION: Interval placement of a left-sided chest tube with insert interval re-expansion of the left lung. No definite residual pneumothorax identified. Electronically Signed   By: Elgie Collard M.D.   On: 11/18/2015 19:41   Dg Chest Portable 1 View  11/18/2015  CLINICAL DATA:  Dyspnea today. Pt c/o not being able to catch her breath at all. Hx of CHF, HTN, COPD with acute exacerbation, acute respiratory failure. EXAM: PORTABLE CHEST 1 VIEW COMPARISON:  10/20/2015 FINDINGS: Large left pneumothorax.  There is no mediastinal shift. Right lung is clear. Cardiac silhouette is mildly enlarged. No mediastinal or hilar masses. IMPRESSION: 1. Large left pneumothorax. Critical Value/emergent results were called by telephone at  the time of interpretation on 11/18/2015 at 6:12 pm to Dr. Pricilla Loveless , who verbally acknowledged these results. Electronically Signed   By: Amie Portland M.D.   On: 11/18/2015 18:12     PERTINENT LAB RESULTS: CBC: No results for input(s): WBC, HGB, HCT, PLT in the last 72 hours. CMET CMP     Component Value Date/Time   NA 136 11/24/2015 0250   K 4.5 11/24/2015 0250   CL 91* 11/24/2015 0250   CO2 33* 11/24/2015 0250   GLUCOSE 295* 11/24/2015 0250   BUN 34* 11/24/2015 0250   CREATININE 1.04* 11/24/2015 0250   CALCIUM 8.4* 11/24/2015 0250   PROT 5.6* 11/18/2015 1827   ALBUMIN 3.1* 11/18/2015 1827   AST 23 11/18/2015 1827   ALT 19 11/18/2015 1827   ALKPHOS 60 11/18/2015 1827   BILITOT 0.2* 11/18/2015 1827   GFRNONAA 52* 11/24/2015 0250   GFRAA 60* 11/24/2015 0250    GFR Estimated Creatinine Clearance: 42.7 mL/min (by C-G formula  based on Cr of 1.04). No results for input(s): LIPASE, AMYLASE in the last 72 hours. No results for input(s): CKTOTAL, CKMB, CKMBINDEX, TROPONINI in the last 72 hours. Invalid input(s): POCBNP No results for input(s): DDIMER in the last 72 hours. No results for input(s): HGBA1C in the last 72 hours. No results for input(s): CHOL, HDL, LDLCALC, TRIG, CHOLHDL, LDLDIRECT in the last 72 hours. No results for input(s): TSH, T4TOTAL, T3FREE, THYROIDAB in the last 72 hours.  Invalid input(s): FREET3 No results for input(s): VITAMINB12, FOLATE, FERRITIN, TIBC, IRON, RETICCTPCT in the last 72 hours. Coags: No results for input(s): INR in the last 72 hours.  Invalid input(s): PT Microbiology: Recent Results (from the past 240 hour(s))  MRSA PCR Screening     Status: None   Collection Time: 11/19/15 12:47 AM  Result Value Ref Range Status   MRSA by PCR NEGATIVE NEGATIVE Final    Comment:        The GeneXpert MRSA Assay (FDA approved for NASAL specimens only), is one component of a comprehensive MRSA colonization surveillance program. It is  not intended to diagnose MRSA infection nor to guide or monitor treatment for MRSA infections.      BRIEF HOSPITAL COURSE:  Spontaneous Pneumothorax on left: Underwent chest tube placement on admission, chest tube removed on 3/6. Repeat chest x-ray on 3/7, shows no further pneumothorax. Cardiothoracic surgery followed patient throughout this hospital stay. Outpatient follow-up with cardiothoracic surgery made, stable for discharge.  Active Problems: COPD exacerbation:  Hospital course complicated by worsening shortness of breath felt to be secondary to COPD exacerbation. Patient was managed with Solu-Medrol, bronchodilators. She slowly improved, by day of discharge, she felt as if she was back to her baseline. She apparently takes 10 mg of prednisone chronically at home, we will slowly taper down steroids to her usual home dosing. She already has oxygen at home which she is to continue. She also will be continued on morphine as needed for comfort. She is a DO NOT RESUSCITATE, and has follow-up with hospice at home which will be resumed on discharge.   Acute on chronic hypoxic respiratory failure: Acute respiratory failure secondary to above, on 3 L of oxygen at home.  Atrial fibrillation with RVR:  Suspect mostly  Multifocal atrial tachycardia interspersed with A. Fib.Rate much better at rest, continues to have mild tachycardia with exertion- which per patient is a usual baseline. Plan is to continue Cardizem on discharge-dose has been increased to 360 mg. She already is on Eliquis.CHADS2VASC.   Chronic diastolic heart failure: Seems to be compensated, suspect shortness of breath is due to pulmonary issues. Continue Lasix.  Type 2 diabetes: CBGs worsened due to IV steroids,  Managed with low-dose Lantus and SSI while inpatient, on discharge she will be transitioned to glipizide.Do not plan to resume metformin on discharge given lactic acidosis on admission.   Anemia: Suspect secondary to  chronic disease, no evidence of blood loss. Transfused 1 unit of blood so far during this hospitalization.  Leukocytosis: Likely secondary to steroids, no indication of infection at this time.   Lactic acidosis: no evidence of infection. Resolved. Could be from metformin. Will not resume at discharge  TODAY-DAY OF DISCHARGE:  Subjective:   Brenda Wells today has no headache,no chest abdominal pain,no new weakness tingling or numbness, feels much better wants to go home today.   Objective:   Blood pressure 126/61, pulse 64, temperature 97.8 F (36.6 C), temperature source Oral, resp. rate 21, height 5\' 5"  (  1.651 m), weight 62.7 kg (138 lb 3.7 oz), SpO2 97 %.  Intake/Output Summary (Last 24 hours) at 11/25/15 0952 Last data filed at 11/24/15 2152  Gross per 24 hour  Intake    720 ml  Output   2801 ml  Net  -2081 ml   Filed Weights   11/23/15 0649 11/24/15 0420 11/25/15 0417  Weight: 64.229 kg (141 lb 9.6 oz) 63.05 kg (139 lb) 62.7 kg (138 lb 3.7 oz)    Exam Awake Alert, Oriented *3, No new F.N deficits, Normal affect [place].AT,PERRAL Supple Neck,No JVD, No cervical lymphadenopathy appriciated.  Symmetrical Chest wall movement, Good air movement bilaterally, CTAB RRR,No Gallops,Rubs or new Murmurs, No Parasternal Heave +ve B.Sounds, Abd Soft, Non tender, No organomegaly appriciated, No rebound -guarding or rigidity. No Cyanosis, Clubbing or edema, No new Rash or bruise  DISCHARGE CONDITION: Stable  DISPOSITION: Home with home health services  DISCHARGE INSTRUCTIONS:    Activity:  As tolerated with Full fall precautions use walker/cane & assistance as needed  Get Medicines reviewed and adjusted: Please take all your medications with you for your next visit with your Primary MD  Please request your Primary MD to go over all hospital tests and procedure/radiological results at the follow up, please ask your Primary MD to get all Hospital records sent to his/her office.  If  you experience worsening of your admission symptoms, develop shortness of breath, life threatening emergency, suicidal or homicidal thoughts you must seek medical attention immediately by calling 911 or calling your MD immediately  if symptoms less severe.  You must read complete instructions/literature along with all the possible adverse reactions/side effects for all the Medicines you take and that have been prescribed to you. Take any new Medicines after you have completely understood and accpet all the possible adverse reactions/side effects.   Do not drive when taking Pain medications.   Do not take more than prescribed Pain, Sleep and Anxiety Medications  Special Instructions: If you have smoked or chewed Tobacco  in the last 2 yrs please stop smoking, stop any regular Alcohol  and or any Recreational drug use.  Wear Seat belts while driving.  Please note  You were cared for by a hospitalist during your hospital stay. Once you are discharged, your primary care physician will handle any further medical issues. Please note that NO REFILLS for any discharge medications will be authorized once you are discharged, as it is imperative that you return to your primary care physician (or establish a relationship with a primary care physician if you do not have one) for your aftercare needs so that they can reassess your need for medications and monitor your lab values.   Diet recommendation: Diabetic Diet Heart Healthy diet  Discharge Instructions    Call MD for:  difficulty breathing, headache or visual disturbances    Complete by:  As directed      Diet - low sodium heart healthy    Complete by:  As directed      Increase activity slowly    Complete by:  As directed            Follow-up Information    Follow up with [facility] [place] On 12/06/2015.   Specialty:  Cardiothoracic Surgery   Why:  Appointment is at 2:30   Contact information:   [address] [place] [id number] [phone]      [facility].  Why:  please get CXR at 2:00   Contact information:   [facility]       Follow up with [name],[name], MD. Schedule an appointment as soon as possible for a visit in 1 week.   Specialty:  Family Medicine     Total Time spent on discharge equals  45 minutes.  SignedJeoffrey Massed 11/25/2015 9:52 AM

## 2015-11-25 NOTE — Progress Notes (Signed)
Physical Therapy Treatment Patient Details Name: BRITTON PERKINSON MRN: [id number] DOB: [birth year] Today's Date: 11/25/2015    History of Present Illness Patient is a 75 y.o. female with hx of COPD on 4L 02, chronic A-fib, acute respiratory failure, anemia, back surgery, hypokalemia, hyponatremia, chronic diastolic CHF and HTN presented with dyspnea at rest. Pt is chronically ill, receiving hospice services for advanced COPD. CXR- demonstrates a 90% pneumothorax on the left side s/p chest tube placement and successful reexpansion of the lung.     PT Comments    Patient eager to return home today. Reports feeling close to baseline with regards to mobility and breathing. Continues to have elevated HR with ambulation 110-161 bpm in A-fib. Session limited as breakfast being delivered. Discussed having son and friend bump pt up stairs with w/c. Pt agreeable. Will continue to follow per current POC.   Follow Up Recommendations  No PT follow up;Supervision/Assistance - 24 hour     Equipment Recommendations  None recommended by PT    Recommendations for Other Services       Precautions / Restrictions Precautions Precautions: Fall Precaution Comments: watch HR Restrictions Weight Bearing Restrictions: No    Mobility  Bed Mobility               General bed mobility comments: Sitting EOB upon PT arrival.   Transfers Overall transfer level: Modified independent Equipment used: Rolling walker (2 wheeled) Transfers: Sit to/from Stand Sit to Stand: Modified independent (Device/Increase time)         General transfer comment: Stood from EOB x1, good demo of technique/safety.  Ambulation/Gait Ambulation/Gait assistance: Supervision Ambulation Distance (Feet): 125 Feet Assistive device: Rolling walker (2 wheeled) Gait Pattern/deviations: Step-through pattern;Decreased stride length;Trunk flexed Gait velocity: decreased   General Gait Details: Slow, steady gait with HR 110-161  bpm. 3/4 DOE. Reports feeling close to baseline. Cues to keep RW until getting to desired surface as pt pushing RW to the side and walking to surface without AD.    Stairs            Wheelchair Mobility    Modified Rankin (Stroke Patients Only)       Balance Overall balance assessment: Needs assistance Sitting-balance support: Feet supported;No upper extremity supported Sitting balance-Leahy Scale: Good     Standing balance support: During functional activity Standing balance-Leahy Scale: Fair Standing balance comment: Able to perform furniture walking within room with 1 UE support.                     Cognition Arousal/Alertness: Awake/alert Behavior During Therapy: WFL for tasks assessed/performed Overall Cognitive Status: Within Functional Limits for tasks assessed                      Exercises      General Comments        Pertinent Vitals/Pain Pain Assessment: No/denies pain    Home Living                      Prior Function            PT Goals (current goals can now be found in the care plan section) Progress towards PT goals: Progressing toward goals    Frequency  Min 3X/week    PT Plan Current plan remains appropriate    Co-evaluation             End of Session Equipment Utilized During Treatment: Oxygen;Gait belt Activity  Tolerance: Patient tolerated treatment well Patient left: in bed;with call bell/phone within reach (sitting EOB eating breakfast.)     Time: 0810-0822 PT Time Calculation (min) (ACUTE ONLY): 12 min  Charges:  $Gait Training: 8-22 mins                    G Codes:      Lennon Richins A Dugan Vanhoesen 11/25/2015, 8:28 AM Mylo RedShauna Venicia Vandall, PT, DPT [phone]

## 2015-11-25 NOTE — Care Management Note (Addendum)
Case Management Note  Patient Details  Name: Brenda Wells MRN: [id number] Date of Birth: [birth year]  Subjective/Objective:  75 y.o. F admitted 11/18/2015 for ARF, CAP and COPD Exacerbation . Lives with son who will transport her home today. Personal Oxygen tank in room for transport home.                   Action/Plan: Anticipate discharge home with [facility]. No further CM needs but will be available should additional discharge needs arise. [facility] Bambi @ [phone].    Expected Discharge Date:                  Expected Discharge Plan:  Home w Hospice Care  In-House Referral:     Discharge planning Services  CM Consult  Post Acute Care Choice:  Resumption of Svcs/PTA Provider, Hospice Choice offered to:     DME Arranged:    DME Agency:     HH Arranged:    HH Agency:     Status of Service:  Completed, signed off  Medicare Important Message Given:  Yes Date Medicare IM Given:    Medicare IM give by:    Date Additional Medicare IM Given:    Additional Medicare Important Message give by:     If discussed at Long Length of Stay Meetings, dates discussed:  11/25/2015  Additional Comments:  Yvone Neurutchfield, Murphy Duzan M, RN 11/25/2015, 10:54 AM

## 2015-11-25 NOTE — Progress Notes (Signed)
Continues to improve Ambulated to the bathroom this am-feels like she is back to her baseline Anxious to be discharged-is followed by home hospice Will d/c patient today-see d/c summary for details.

## 2015-11-25 NOTE — Care Management Important Message (Signed)
Important Message  Patient Details  Name: Brenda Wells MRN: [id number] Date of Birth: [birth year]   Medicare Important Message Given:  Yes    [name], Steffon Gladu [name] 11/25/2015, 12:02 PM

## 2015-12-03 ENCOUNTER — Other Ambulatory Visit: Payer: Self-pay | Admitting: Cardiothoracic Surgery

## 2015-12-03 DIAGNOSIS — J939 Pneumothorax, unspecified: Secondary | ICD-10-CM

## 2015-12-06 ENCOUNTER — Ambulatory Visit: Payer: Medicare Other

## 2015-12-06 DIAGNOSIS — J440 Chronic obstructive pulmonary disease with acute lower respiratory infection: Secondary | ICD-10-CM | POA: Diagnosis not present

## 2015-12-13 ENCOUNTER — Ambulatory Visit: Payer: Medicare Other

## 2015-12-15 NOTE — Progress Notes (Signed)
[id number]; Bradly BienenstockM  [APSK',;.

## 2015-12-18 DIAGNOSIS — J449 Chronic obstructive pulmonary disease, unspecified: Secondary | ICD-10-CM | POA: Diagnosis not present

## 2015-12-20 ENCOUNTER — Encounter (HOSPITAL_COMMUNITY): Payer: Self-pay | Admitting: Emergency Medicine

## 2015-12-20 ENCOUNTER — Emergency Department (HOSPITAL_COMMUNITY)

## 2015-12-20 ENCOUNTER — Inpatient Hospital Stay (HOSPITAL_COMMUNITY)
Admission: EM | Admit: 2015-12-20 | Discharge: 2015-12-27 | DRG: 190 | Disposition: A | Discharge status: Home Health | Attending: Internal Medicine | Admitting: Internal Medicine

## 2015-12-20 DIAGNOSIS — I5032 Chronic diastolic (congestive) heart failure: Secondary | ICD-10-CM | POA: Diagnosis not present

## 2015-12-20 DIAGNOSIS — I110 Hypertensive heart disease with heart failure: Secondary | ICD-10-CM | POA: Diagnosis present

## 2015-12-20 DIAGNOSIS — D72829 Elevated white blood cell count, unspecified: Secondary | ICD-10-CM | POA: Diagnosis present

## 2015-12-20 DIAGNOSIS — J189 Pneumonia, unspecified organism: Secondary | ICD-10-CM | POA: Diagnosis present

## 2015-12-20 DIAGNOSIS — Z7951 Long term (current) use of inhaled steroids: Secondary | ICD-10-CM | POA: Diagnosis not present

## 2015-12-20 DIAGNOSIS — Z789 Other specified health status: Secondary | ICD-10-CM | POA: Diagnosis not present

## 2015-12-20 DIAGNOSIS — F17210 Nicotine dependence, cigarettes, uncomplicated: Secondary | ICD-10-CM | POA: Diagnosis present

## 2015-12-20 DIAGNOSIS — Z9981 Dependence on supplemental oxygen: Secondary | ICD-10-CM | POA: Diagnosis not present

## 2015-12-20 DIAGNOSIS — Z90710 Acquired absence of both cervix and uterus: Secondary | ICD-10-CM

## 2015-12-20 DIAGNOSIS — I4891 Unspecified atrial fibrillation: Secondary | ICD-10-CM | POA: Diagnosis present

## 2015-12-20 DIAGNOSIS — Y95 Nosocomial condition: Secondary | ICD-10-CM | POA: Diagnosis present

## 2015-12-20 DIAGNOSIS — J440 Chronic obstructive pulmonary disease with acute lower respiratory infection: Secondary | ICD-10-CM | POA: Diagnosis not present

## 2015-12-20 DIAGNOSIS — J9621 Acute and chronic respiratory failure with hypoxia: Secondary | ICD-10-CM | POA: Diagnosis not present

## 2015-12-20 DIAGNOSIS — R0602 Shortness of breath: Secondary | ICD-10-CM | POA: Diagnosis not present

## 2015-12-20 DIAGNOSIS — Z79899 Other long term (current) drug therapy: Secondary | ICD-10-CM

## 2015-12-20 DIAGNOSIS — J9622 Acute and chronic respiratory failure with hypercapnia: Secondary | ICD-10-CM | POA: Diagnosis present

## 2015-12-20 DIAGNOSIS — Z7901 Long term (current) use of anticoagulants: Secondary | ICD-10-CM | POA: Diagnosis not present

## 2015-12-20 DIAGNOSIS — Z7984 Long term (current) use of oral hypoglycemic drugs: Secondary | ICD-10-CM | POA: Diagnosis not present

## 2015-12-20 DIAGNOSIS — Z7189 Other specified counseling: Secondary | ICD-10-CM | POA: Diagnosis not present

## 2015-12-20 DIAGNOSIS — J441 Chronic obstructive pulmonary disease with (acute) exacerbation: Secondary | ICD-10-CM | POA: Diagnosis present

## 2015-12-20 DIAGNOSIS — Z515 Encounter for palliative care: Secondary | ICD-10-CM | POA: Insufficient documentation

## 2015-12-20 DIAGNOSIS — E119 Type 2 diabetes mellitus without complications: Secondary | ICD-10-CM | POA: Diagnosis not present

## 2015-12-20 LAB — CBC WITH DIFFERENTIAL/PLATELET
BASOS ABS: 0 10*3/uL (ref 0.0–0.1)
Basophils Relative: 0 %
Eosinophils Absolute: 0 10*3/uL (ref 0.0–0.7)
Eosinophils Relative: 0 %
HCT: 37.6 % (ref 36.0–46.0)
HEMOGLOBIN: 11.4 g/dL — AB (ref 12.0–15.0)
LYMPHS ABS: 4.4 10*3/uL — AB (ref 0.7–4.0)
LYMPHS PCT: 27 %
MCH: 27.3 pg (ref 26.0–34.0)
MCHC: 30.3 g/dL (ref 30.0–36.0)
MCV: 90 fL (ref 78.0–100.0)
Monocytes Absolute: 1.7 10*3/uL — ABNORMAL HIGH (ref 0.1–1.0)
Monocytes Relative: 10 %
NEUTROS PCT: 63 %
Neutro Abs: 10.2 10*3/uL — ABNORMAL HIGH (ref 1.7–7.7)
Platelets: 349 10*3/uL (ref 150–400)
RBC: 4.18 MIL/uL (ref 3.87–5.11)
RDW: 17.8 % — ABNORMAL HIGH (ref 11.5–15.5)
WBC: 16.4 10*3/uL — AB (ref 4.0–10.5)

## 2015-12-20 LAB — TROPONIN I: Troponin I: 0.03 ng/mL (ref <0.031)

## 2015-12-20 LAB — I-STAT ARTERIAL BLOOD GAS, ED
Acid-Base Excess: 13 mmol/L — ABNORMAL HIGH (ref 0.0–2.0)
Bicarbonate: 40.9 mEq/L — ABNORMAL HIGH (ref 20.0–24.0)
O2 Saturation: 99 %
PCO2 ART: 71.2 mmHg — AB (ref 35.0–45.0)
PH ART: 7.367 (ref 7.350–7.450)
PO2 ART: 158 mmHg — AB (ref 80.0–100.0)
Patient temperature: 98.6
TCO2: 43 mmol/L (ref 0–100)

## 2015-12-20 LAB — COMPREHENSIVE METABOLIC PANEL
ALK PHOS: 61 U/L (ref 38–126)
ALT: 23 U/L (ref 14–54)
AST: 20 U/L (ref 15–41)
Albumin: 3.4 g/dL — ABNORMAL LOW (ref 3.5–5.0)
Anion gap: 12 (ref 5–15)
BUN: 19 mg/dL (ref 6–20)
CHLORIDE: 97 mmol/L — AB (ref 101–111)
CO2: 34 mmol/L — AB (ref 22–32)
CREATININE: 0.86 mg/dL (ref 0.44–1.00)
Calcium: 8.6 mg/dL — ABNORMAL LOW (ref 8.9–10.3)
Glucose, Bld: 124 mg/dL — ABNORMAL HIGH (ref 65–99)
Potassium: 3.4 mmol/L — ABNORMAL LOW (ref 3.5–5.1)
SODIUM: 143 mmol/L (ref 135–145)
Total Bilirubin: 0.2 mg/dL — ABNORMAL LOW (ref 0.3–1.2)
Total Protein: 6.3 g/dL — ABNORMAL LOW (ref 6.5–8.1)

## 2015-12-20 LAB — PROTIME-INR
INR: 1.03 (ref 0.00–1.49)
Prothrombin Time: 13.7 seconds (ref 11.6–15.2)

## 2015-12-20 LAB — GLUCOSE, CAPILLARY: Glucose-Capillary: 206 mg/dL — ABNORMAL HIGH (ref 65–99)

## 2015-12-20 LAB — I-STAT CG4 LACTIC ACID, ED: Lactic Acid, Venous: 1.38 mmol/L (ref 0.5–2.0)

## 2015-12-20 LAB — STREP PNEUMONIAE URINARY ANTIGEN: Strep Pneumo Urinary Antigen: NEGATIVE

## 2015-12-20 LAB — MRSA PCR SCREENING: MRSA BY PCR: NEGATIVE

## 2015-12-20 LAB — BRAIN NATRIURETIC PEPTIDE: B Natriuretic Peptide: 68.6 pg/mL (ref 0.0–100.0)

## 2015-12-20 LAB — CBG MONITORING, ED: GLUCOSE-CAPILLARY: 178 mg/dL — AB (ref 65–99)

## 2015-12-20 LAB — HIV ANTIBODY (ROUTINE TESTING W REFLEX): HIV Screen 4th Generation wRfx: NONREACTIVE

## 2015-12-20 MED ORDER — GUAIFENESIN ER 600 MG PO TB12
600.0000 mg | ORAL_TABLET | Freq: Two times a day (BID) | ORAL | Status: DC
  Administered 2015-12-20 – 2015-12-27 (×14): 600 mg via ORAL
  Filled 2015-12-20 (×15): qty 1

## 2015-12-20 MED ORDER — ALBUTEROL (5 MG/ML) CONTINUOUS INHALATION SOLN
10.0000 mg/h | INHALATION_SOLUTION | Freq: Once | RESPIRATORY_TRACT | Status: AC
  Administered 2015-12-20: 10 mg/h via RESPIRATORY_TRACT
  Filled 2015-12-20: qty 20

## 2015-12-20 MED ORDER — APIXABAN 5 MG PO TABS
5.0000 mg | ORAL_TABLET | Freq: Two times a day (BID) | ORAL | Status: DC
  Administered 2015-12-20 – 2015-12-27 (×15): 5 mg via ORAL
  Filled 2015-12-20 (×16): qty 1

## 2015-12-20 MED ORDER — IPRATROPIUM-ALBUTEROL 0.5-2.5 (3) MG/3ML IN SOLN
3.0000 mL | Freq: Four times a day (QID) | RESPIRATORY_TRACT | Status: DC
  Filled 2015-12-20: qty 3

## 2015-12-20 MED ORDER — INSULIN ASPART 100 UNIT/ML ~~LOC~~ SOLN
0.0000 [IU] | Freq: Three times a day (TID) | SUBCUTANEOUS | Status: DC
  Administered 2015-12-20: 3 [IU] via SUBCUTANEOUS
  Administered 2015-12-21 (×2): 2 [IU] via SUBCUTANEOUS
  Administered 2015-12-22 (×2): 5 [IU] via SUBCUTANEOUS
  Administered 2015-12-22: 3 [IU] via SUBCUTANEOUS
  Administered 2015-12-23 – 2015-12-24 (×5): 5 [IU] via SUBCUTANEOUS
  Administered 2015-12-24 – 2015-12-25 (×2): 3 [IU] via SUBCUTANEOUS
  Administered 2015-12-25: 8 [IU] via SUBCUTANEOUS
  Administered 2015-12-25: 3 [IU] via SUBCUTANEOUS
  Administered 2015-12-26: 8 [IU] via SUBCUTANEOUS
  Administered 2015-12-26: 5 [IU] via SUBCUTANEOUS
  Administered 2015-12-26 – 2015-12-27 (×2): 3 [IU] via SUBCUTANEOUS
  Filled 2015-12-20: qty 1

## 2015-12-20 MED ORDER — PANTOPRAZOLE SODIUM 40 MG PO TBEC
40.0000 mg | DELAYED_RELEASE_TABLET | Freq: Every day | ORAL | Status: DC
  Administered 2015-12-20 – 2015-12-24 (×5): 40 mg via ORAL
  Filled 2015-12-20 (×5): qty 1

## 2015-12-20 MED ORDER — FUROSEMIDE 10 MG/ML IJ SOLN
40.0000 mg | Freq: Once | INTRAMUSCULAR | Status: AC
  Administered 2015-12-20: 40 mg via INTRAVENOUS
  Filled 2015-12-20: qty 4

## 2015-12-20 MED ORDER — VANCOMYCIN HCL 500 MG IV SOLR
500.0000 mg | Freq: Two times a day (BID) | INTRAVENOUS | Status: DC
  Administered 2015-12-20: 500 mg via INTRAVENOUS
  Filled 2015-12-20 (×2): qty 500

## 2015-12-20 MED ORDER — METHYLPREDNISOLONE SODIUM SUCC 40 MG IJ SOLR
40.0000 mg | Freq: Four times a day (QID) | INTRAMUSCULAR | Status: DC
  Administered 2015-12-20 – 2015-12-23 (×13): 40 mg via INTRAVENOUS
  Filled 2015-12-20 (×13): qty 1

## 2015-12-20 MED ORDER — DILTIAZEM HCL ER COATED BEADS 180 MG PO CP24
360.0000 mg | ORAL_CAPSULE | Freq: Every day | ORAL | Status: DC
  Administered 2015-12-20 – 2015-12-25 (×6): 360 mg via ORAL
  Filled 2015-12-20: qty 2
  Filled 2015-12-20: qty 3
  Filled 2015-12-20: qty 1
  Filled 2015-12-20 (×3): qty 3

## 2015-12-20 MED ORDER — MORPHINE SULFATE (PF) 2 MG/ML IV SOLN
1.0000 mg | Freq: Once | INTRAVENOUS | Status: AC
  Administered 2015-12-20: 1 mg via INTRAVENOUS
  Filled 2015-12-20: qty 1

## 2015-12-20 MED ORDER — ZOLPIDEM TARTRATE 5 MG PO TABS
5.0000 mg | ORAL_TABLET | Freq: Every evening | ORAL | Status: DC | PRN
Start: 2015-12-20 — End: 2015-12-27
  Administered 2015-12-20 – 2015-12-26 (×5): 5 mg via ORAL
  Filled 2015-12-20 (×5): qty 1

## 2015-12-20 MED ORDER — IPRATROPIUM-ALBUTEROL 0.5-2.5 (3) MG/3ML IN SOLN
3.0000 mL | RESPIRATORY_TRACT | Status: DC
  Administered 2015-12-20 (×3): 3 mL via RESPIRATORY_TRACT
  Filled 2015-12-20 (×2): qty 3

## 2015-12-20 MED ORDER — DEXTROSE 5 % IV SOLN
2.0000 g | INTRAVENOUS | Status: DC
  Administered 2015-12-21 – 2015-12-23 (×3): 2 g via INTRAVENOUS
  Filled 2015-12-20 (×3): qty 2

## 2015-12-20 MED ORDER — VANCOMYCIN HCL 500 MG IV SOLR
500.0000 mg | Freq: Two times a day (BID) | INTRAVENOUS | Status: DC
  Administered 2015-12-21 – 2015-12-22 (×4): 500 mg via INTRAVENOUS
  Filled 2015-12-20 (×9): qty 500

## 2015-12-20 MED ORDER — DEXTROSE 5 % IV SOLN
2.0000 g | Freq: Once | INTRAVENOUS | Status: AC
  Administered 2015-12-20: 2 g via INTRAVENOUS
  Filled 2015-12-20: qty 2

## 2015-12-20 MED ORDER — OXYCODONE HCL 5 MG PO TABS
5.0000 mg | ORAL_TABLET | ORAL | Status: DC | PRN
  Administered 2015-12-24: 5 mg via ORAL
  Filled 2015-12-20: qty 1

## 2015-12-20 MED ORDER — FUROSEMIDE 20 MG PO TABS: 40.0000 mg | ORAL_TABLET | Freq: Every day | ORAL | Status: DC

## 2015-12-20 MED ORDER — MOMETASONE FURO-FORMOTEROL FUM 200-5 MCG/ACT IN AERO
2.0000 | INHALATION_SPRAY | Freq: Two times a day (BID) | RESPIRATORY_TRACT | Status: DC
  Administered 2015-12-20 – 2015-12-27 (×14): 2 via RESPIRATORY_TRACT
  Filled 2015-12-20 (×2): qty 8.8

## 2015-12-20 MED ORDER — LORATADINE 10 MG PO TABS
10.0000 mg | ORAL_TABLET | Freq: Every day | ORAL | Status: DC
  Administered 2015-12-20 – 2015-12-27 (×8): 10 mg via ORAL
  Filled 2015-12-20 (×8): qty 1

## 2015-12-20 MED ORDER — ALBUTEROL SULFATE (2.5 MG/3ML) 0.083% IN NEBU: 2.5000 mg | INHALATION_SOLUTION | RESPIRATORY_TRACT | Status: AC | PRN

## 2015-12-20 MED ORDER — VANCOMYCIN HCL IN DEXTROSE 1-5 GM/200ML-% IV SOLN
1000.0000 mg | Freq: Once | INTRAVENOUS | Status: AC
  Administered 2015-12-20: 1000 mg via INTRAVENOUS
  Filled 2015-12-20: qty 200

## 2015-12-20 MED ORDER — SENNOSIDES-DOCUSATE SODIUM 8.6-50 MG PO TABS
1.0000 | ORAL_TABLET | Freq: Two times a day (BID) | ORAL | Status: DC
  Administered 2015-12-20 – 2015-12-23 (×5): 1 via ORAL
  Filled 2015-12-20 (×9): qty 1

## 2015-12-20 MED ORDER — SENNA-DOCUSATE SODIUM 8.6-50 MG PO TABS: 2.0000 | ORAL_TABLET | Freq: Two times a day (BID) | ORAL | Status: DC

## 2015-12-20 MED ORDER — GLUCERNA SHAKE PO LIQD
237.0000 mL | Freq: Three times a day (TID) | ORAL | Status: DC
  Administered 2015-12-20 – 2015-12-22 (×6): 237 mL via ORAL
  Filled 2015-12-20: qty 237

## 2015-12-20 NOTE — ED Notes (Signed)
RRT Morrie Sheldonshley made aware that Lequita HaltMorgan, RN on 3S that will be taking care of patient is requesting that bipap machine come up with the patient in case she needs to be placed back on it, [facility] advised ED staff could transport it up with patient.

## 2015-12-20 NOTE — ED Notes (Signed)
Morrie SheldonAshley with respiratory notified of order to wean patient off of bipap.

## 2015-12-20 NOTE — ED Notes (Signed)
Respiratory at Bedside.

## 2015-12-20 NOTE — Progress Notes (Signed)
Pharmacy Antibiotic Note  Brenda Wells is a 75 y.o. female admitted on 12/20/2015 with pneumonia.  WBC 16.4, afebrile, SCr 0.8, LA 1.3.   Plan: -Vancomycin 1 g IV x1 then 500 mg IV q12h -Cefepime 2 g IV x1 then 2 g IV q24h -Monitor renal fx, cultures, duration of therapy, VT as needed    Recent Labs Lab 12/20/15 0445 12/20/15 0447  WBC  --  16.4*  CREATININE  --  0.86  LATICACIDVEN 1.38  --     CrCl cannot be calculated (Unknown ideal weight.).    No Known Allergies  Antimicrobials this admission:  4/3 cefepime > 4/3 vancomycin >  Dose adjustments this admission: N/A  Microbiology results: 4/3 blood cx:  Thank you for allowing pharmacy to be a part of this patient's care.  Brenda Wells, Brenda Wells 12/20/2015 10:32 AM

## 2015-12-20 NOTE — ED Provider Notes (Signed)
CSN: [id number]     Arrival date & time 12/20/15  0402 History   First MD Initiated Contact with Patient 12/20/15 0411     Chief Complaint  Patient presents with  . Shortness of Breath     (Consider location/radiation/quality/duration/timing/severity/associated sxs/prior Treatment) HPI Comments: Patient presents to the ER for evaluation of severe shortness of breath. Patient reports that symptoms began after going to bed last night. She does have a history of COPD. She tried breathing treatments at home without improvement. EMS administered Solu-Medrol 125 mg IV, DuoNeb, albuterol 3 without significant improvement. Patient reportedly was extremely dyspneic when first responders arrived on the scene and her oxygen saturations was 72% on her normal 2 L nasal cannula. She has improved her oxygenation during transport with nebulizer treatment. Patient denies any chest pain.  Patient is a 75 y.o. female presenting with shortness of breath.  Shortness of Breath Associated symptoms: cough and wheezing     Past Medical History  Diagnosis Date  . Acute respiratory failure (HCC)   . Community acquired pneumonia   . COPD with acute exacerbation (HCC)   . Anemia   . Hypokalemia   . Hyponatremia   . CHF (congestive heart failure) (HCC)   . Hypertension   . Shortness of breath dyspnea   . History of kidney stones    Past Surgical History  Procedure Laterality Date  . Back surgery    . Cyst excision      base of tongue  . Cholecystectomy    . Abdominal hysterectomy     Family History  Problem Relation Age of Onset  . Diabetes Mellitus II Mother   . CAD Mother   . CAD Brother    Social History  Substance Use Topics  . Smoking status: Current Every Day Smoker -- 0.10 packs/day for 60 years    Types: Cigarettes  . Smokeless tobacco: Never Used     Comment: I smoke 1 cigarette a day "  . Alcohol Use: No   OB History    No data available     Review of Systems  Respiratory:  Positive for cough, shortness of breath and wheezing.   All other systems reviewed and are negative.     Allergies  Review of patient's allergies indicates no known allergies.  Home Medications   Prior to Admission medications   Medication Sig Start Date End Date Taking? Authorizing Provider  albuterol (PROVENTIL) (2.5 MG/3ML) 0.083% nebulizer solution Take 3 mLs (2.5 mg total) by nebulization every 2 (two) hours as needed for wheezing or shortness of breath. 09/07/15  Yes Richarda Overlie, MD  apixaban (ELIQUIS) 5 MG TABS tablet Take 1 tablet (5 mg total) by mouth 2 (two) times daily. 10/23/15  Yes Ripudeep Jenna Luo, MD  arformoterol (BROVANA) 15 MCG/2ML NEBU Take 2 mLs (15 mcg total) by nebulization 2 (two) times daily. 09/07/15  Yes Richarda Overlie, MD  diltiazem (CARDIZEM CD) 360 MG 24 hr capsule Take 1 capsule (360 mg total) by mouth daily. 11/25/15  Yes Shanker Levora Dredge, MD  feeding supplement, GLUCERNA SHAKE, (GLUCERNA SHAKE) LIQD Take 237 mLs by mouth 3 (three) times daily between meals. 11/25/15  Yes Shanker Levora Dredge, MD  Fluticasone-Salmeterol (ADVAIR) 500-50 MCG/DOSE AEPB Inhale 1 puff into the lungs 2 (two) times daily.   Yes Historical Provider, MD  furosemide (LASIX) 40 MG tablet Take 1 tablet (40 mg total) by mouth daily. 10/04/15  Yes Maryann Mikhail, DO  glipiZIDE (GLUCOTROL) 5 MG tablet Take  1 tablet (5 mg total) by mouth daily before breakfast. 11/25/15  Yes Shanker Levora Dredge, MD  guaiFENesin (MUCINEX) 600 MG 12 hr tablet Take 1 tablet (600 mg total) by mouth 2 (two) times daily. 10/23/15  Yes Ripudeep K Rai, MD  ipratropium-albuterol (DUONEB) 0.5-2.5 (3) MG/3ML SOLN Take 3 mLs by nebulization 3 (three) times daily. 10/23/15  Yes Ripudeep Jenna Luo, MD  loratadine (CLARITIN) 10 MG tablet Take 10 mg by mouth daily.   Yes Historical Provider, MD  omeprazole (PRILOSEC) 20 MG capsule Take 20 mg by mouth daily.   Yes Historical Provider, MD  ondansetron (ZOFRAN) 4 MG tablet Take 4 mg by mouth every 6  (six) hours as needed for nausea or vomiting.  11/14/15  Yes Historical Provider, MD  oxyCODONE (OXY IR/ROXICODONE) 5 MG immediate release tablet Take 1 tablet (5 mg total) by mouth every 4 (four) hours as needed for moderate pain (dyspnea, SOB). 11/25/15  Yes Shanker Levora Dredge, MD  sennosides-docusate sodium (SENOKOT-S) 8.6-50 MG tablet Take 2 tablets by mouth 2 (two) times daily.   Yes Historical Provider, MD  SPIRIVA HANDIHALER 18 MCG inhalation capsule Place 18 mcg into inhaler and inhale daily.  08/24/15  Yes Historical Provider, MD  zolpidem (AMBIEN) 5 MG tablet Take 1 tablet (5 mg total) by mouth at bedtime as needed for sleep. 11/25/15  Yes Shanker Levora Dredge, MD   BP 122/98 mmHg  Pulse 113  Resp 24  SpO2 98% Physical Exam  Constitutional: She is oriented to person, place, and time. She appears well-developed and well-nourished. She appears distressed.  HENT:  Head: Normocephalic and atraumatic.  Right Ear: Hearing normal.  Left Ear: Hearing normal.  Nose: Nose normal.  Mouth/Throat: Oropharynx is clear and moist and mucous membranes are normal.  Eyes: Conjunctivae and EOM are normal. Pupils are equal, round, and reactive to light.  Neck: Normal range of motion. Neck supple.  Cardiovascular: S1 normal and S2 normal.  An irregularly irregular rhythm present. Tachycardia present.  Exam reveals no gallop and no friction rub.   No murmur heard. Pulmonary/Chest: Accessory muscle usage present. Tachypnea noted. She is in respiratory distress. She has decreased breath sounds. She has wheezes. She has rhonchi. She exhibits no tenderness.  Abdominal: Soft. Normal appearance and bowel sounds are normal. There is no hepatosplenomegaly. There is no tenderness. There is no rebound, no guarding, no tenderness at McBurney's point and negative Murphy's sign. No hernia.  Musculoskeletal: Normal range of motion.  Neurological: She is alert and oriented to person, place, and time. She has normal strength. No  cranial nerve deficit or sensory deficit. Coordination normal. GCS eye subscore is 4. GCS verbal subscore is 5. GCS motor subscore is 6.  Skin: Skin is warm, dry and intact. No rash noted. No cyanosis.  Psychiatric: She has a normal mood and affect. Her speech is normal and behavior is normal. Thought content normal.  Nursing note and vitals reviewed.   ED Course  Procedures (including critical care time)  CRITICAL CARE Performed by: Gilda Crease.   Total critical care time: 30 minutes  Critical care time was exclusive of separately billable procedures and treating other patients.  Critical care was necessary to treat or prevent imminent or life-threatening deterioration.  Critical care was time spent personally by me on the following activities: development of treatment plan with patient and/or surrogate as well as nursing, discussions with consultants, evaluation of patient's response to treatment, examination of patient, obtaining history from patient or  surrogate, ordering and performing treatments and interventions, ordering and review of laboratory studies, ordering and review of radiographic studies, pulse oximetry and re-evaluation of patient's condition.   Labs Review Labs Reviewed  CBC WITH DIFFERENTIAL/PLATELET - Abnormal; Notable for the following:    WBC 16.4 (*)    Hemoglobin 11.4 (*)    RDW 17.8 (*)    Neutro Abs 10.2 (*)    Lymphs Abs 4.4 (*)    Monocytes Absolute 1.7 (*)    All other components within normal limits  COMPREHENSIVE METABOLIC PANEL - Abnormal; Notable for the following:    Potassium 3.4 (*)    Chloride 97 (*)    CO2 34 (*)    Glucose, Bld 124 (*)    Calcium 8.6 (*)    Total Protein 6.3 (*)    Albumin 3.4 (*)    Total Bilirubin 0.2 (*)    All other components within normal limits  I-STAT ARTERIAL BLOOD GAS, ED - Abnormal; Notable for the following:    pCO2 arterial 71.2 (*)    pO2, Arterial 158.0 (*)    Bicarbonate 40.9 (*)     Acid-Base Excess 13.0 (*)    All other components within normal limits  CULTURE, BLOOD (ROUTINE X 2)  CULTURE, BLOOD (ROUTINE X 2)  TROPONIN I  BRAIN NATRIURETIC PEPTIDE  PROTIME-INR  I-STAT CG4 LACTIC ACID, ED    Imaging Review Dg Chest Port 1 View  12/20/2015  CLINICAL DATA:  Shortness of breath beginning last night. History of COPD and pneumonia, CHF. EXAM: PORTABLE CHEST 1 VIEW COMPARISON:  Chest radiograph November 23, 2015 FINDINGS: The cardiac silhouette is moderately enlarged unchanged. Mildly calcified aortic knob. Similar chronic interstitial changes increased lung volumes compatible with COPD. New small LEFT pleural effusion and LEFT midlung zone airspace opacity. No pneumothorax. Osteopenia. Soft tissues are normal. IMPRESSION: New small LEFT pleural effusion with LEFT midlung zone airspace opacity. Followup PA and lateral chest X-ray is recommended in 3-4 weeks following trial of antibiotic therapy to ensure resolution and exclude underlying malignancy. Stable cardiomegaly and COPD. Electronically Signed   By: Awilda Metro M.D.   On: 12/20/2015 04:42   I have personally reviewed and evaluated these images and lab results as part of my medical decision-making.   EKG Interpretation   Date/Time:  Monday December 20 2015 04:03:17 EDT Ventricular Rate:  148 PR Interval:  95 QRS Duration: 84 QT Interval:  296 QTC Calculation: 464 R Axis:   52 Text Interpretation:  afib vs mat Low voltage, extremity leads Artifact in  lead(s) I III aVL V3 V4 Confirmed by POLLINA  MD, CHRISTOPHER ([id number]) on  12/20/2015 5:09:47 AM      MDM   Final diagnoses:  HCAP (healthcare-associated pneumonia)  Atrial fibrillation with RVR (HCC)  Acute on chronic respiratory failure with hypoxia and hypercapnia (HCC)    Patient presents to the emergency part for evaluation of severe shortness of breath. Patient reports sudden onset of shortness of breath while sleeping. Patient arrived in the ER in some  distress. She appeared to be much more distressed prior to arrival and treatment by EMS with multiple nebulizer treatments. Oxygenation is now improved. Patient is tachycardic. EKG rhythm is either atrial fibrillation with rapid ventricular response (reports previous history of) or multifocal atrial tachycardia. Patient is anticoagulated.  Chest x-ray shows evidence of new pneumonia in the left midlung field. Patient initiated on treatment for healthcare associated pneumonia. Vision has severe COPD and has had multiple admissions in the  last few months. She will require close monitoring. Reviewing her records, however, reveals that she is a DO NOT RESUSCITATE.  Patient placed on continuous albuterol treatment here in the ER. She still has not had significant improvement. She has diffuse wheezes and respiratory rate is in the mid 20s. Patient will be tried on BiPAP.   Gilda Creasehristopher J Pollina, MD 12/20/15 [phone]

## 2015-12-20 NOTE — H&P (Signed)
[facility] History and Physical  Brenda Wells ZOX:[id number] DOB: [birth year] DOA: 12/20/2015  Referring physician: Emergency Department PCP: [name], MD   CHIEF COMPLAINT:  Shortness of breath            HPI: Brenda Wells is a 75 y.o. female with multiple medical problems including severe COPD on home 02, atrial fibrillation on Eliquis. She was brought to ED this am with shortness of breath which woke her up in the middle of the night. No associated chest pain. No significant cough. Uses daily Nebulizers at home. Patient still smokes a cigarette a day      ED workup:   WBC 16, normal trop, BNP and lactic acid   Ref. Range 12/20/2015 04:28  pH, Arterial Latest Ref Range: 7.350-7.450  7.367  pCO2 arterial Latest Ref Range: 35.0-45.0 mmHg 71.2 (HH)  pO2, Arterial Latest Ref Range: 80.0-100.0 mmHg 158.0 (H)  Bicarbonate Latest Ref Range: 20.0-24.0 mEq/L 40.9 (H)  O2 Saturation Latest Units: % 99.0    CXR -New small LEFT pleural effusion with LEFT midlung zone airspace opacity  EKG:    afib vs mat Low voltage, extremity leads Artifact in lead(s) I III aVL V3 V4 Confirmed by POLLINA MD, CHRISTOPHER ([id number]) on 12/20/2015 5:09:47 AM           Vent rate 148       Medications  vancomycin (VANCOCIN) IVPB 1000 mg/200 mL premix (1,000 mg Intravenous New Bag/Given 12/20/15 0641)  albuterol (PROVENTIL) (2.5 MG/3ML) 0.083% nebulizer solution 2.5 mg (not administered)  albuterol (PROVENTIL,VENTOLIN) solution continuous neb (10 mg/hr Nebulization Given 12/20/15 0436)  ceFEPIme (MAXIPIME) 2 g in dextrose 5 % 50 mL IVPB (0 g Intravenous Stopped 12/20/15 0553)  morphine 2 MG/ML injection 1 mg (1 mg Intravenous Given 12/20/15 0642)    Review of Systems  HENT: Negative.   Eyes: Negative.   Respiratory: Positive for shortness of breath and wheezing.   Cardiovascular: Positive for leg swelling.  Genitourinary: Negative.   Musculoskeletal: Negative.   Skin: Negative.   Neurological:  Negative.   Endo/Heme/Allergies: Negative.   Psychiatric/Behavioral: Negative.     Past Medical History  Diagnosis Date  . Acute respiratory failure (HCC)   . Community acquired pneumonia   . COPD with acute exacerbation (HCC)   . Anemia   . Hypokalemia   . Hyponatremia   . CHF (congestive heart failure) (HCC)   . Hypertension   . Shortness of breath dyspnea   . History of kidney stones    Past Surgical History  Procedure Laterality Date  . Back surgery    . Cyst excision      base of tongue  . Cholecystectomy    . Abdominal hysterectomy      SOCIAL HISTORY:  reports that she has been smoking Cigarettes.  She has a 6 pack-year smoking history. She has never used smokeless tobacco. She reports that she does not drink alcohol or use illicit drugs. Lives:  At home with son     Assistive devices:   Uses both cane and walker for ambulation.   No Known Allergies  Family History  Problem Relation Age of Onset  . Diabetes Mellitus II Mother   . CAD Mother   . CAD Brother     Prior to Admission medications   Medication Sig Start Date End Date Taking? Authorizing Provider  albuterol (PROVENTIL) (2.5 MG/3ML) 0.083% nebulizer solution Take 3 mLs (2.5 mg total) by nebulization every 2 (two) hours as needed for  wheezing or shortness of breath. 09/07/15  Yes Richarda Overlie, MD  apixaban (ELIQUIS) 5 MG TABS tablet Take 1 tablet (5 mg total) by mouth 2 (two) times daily. 10/23/15  Yes Ripudeep Jenna Luo, MD  arformoterol (BROVANA) 15 MCG/2ML NEBU Take 2 mLs (15 mcg total) by nebulization 2 (two) times daily. 09/07/15  Yes Richarda Overlie, MD  diltiazem (CARDIZEM CD) 360 MG 24 hr capsule Take 1 capsule (360 mg total) by mouth daily. 11/25/15  Yes Shanker Levora Dredge, MD  feeding supplement, GLUCERNA SHAKE, (GLUCERNA SHAKE) LIQD Take 237 mLs by mouth 3 (three) times daily between meals. 11/25/15  Yes Shanker Levora Dredge, MD  Fluticasone-Salmeterol (ADVAIR) 500-50 MCG/DOSE AEPB Inhale 1 puff into the lungs 2  (two) times daily.   Yes Historical Provider, MD  furosemide (LASIX) 40 MG tablet Take 1 tablet (40 mg total) by mouth daily. 10/04/15  Yes Maryann Mikhail, DO  glipiZIDE (GLUCOTROL) 5 MG tablet Take 1 tablet (5 mg total) by mouth daily before breakfast. 11/25/15  Yes Shanker Levora Dredge, MD  guaiFENesin (MUCINEX) 600 MG 12 hr tablet Take 1 tablet (600 mg total) by mouth 2 (two) times daily. 10/23/15  Yes Ripudeep K Rai, MD  ipratropium-albuterol (DUONEB) 0.5-2.5 (3) MG/3ML SOLN Take 3 mLs by nebulization 3 (three) times daily. 10/23/15  Yes Ripudeep Jenna Luo, MD  loratadine (CLARITIN) 10 MG tablet Take 10 mg by mouth daily.   Yes Historical Provider, MD  omeprazole (PRILOSEC) 20 MG capsule Take 20 mg by mouth daily.   Yes Historical Provider, MD  ondansetron (ZOFRAN) 4 MG tablet Take 4 mg by mouth every 6 (six) hours as needed for nausea or vomiting.  11/14/15  Yes Historical Provider, MD  oxyCODONE (OXY IR/ROXICODONE) 5 MG immediate release tablet Take 1 tablet (5 mg total) by mouth every 4 (four) hours as needed for moderate pain (dyspnea, SOB). 11/25/15  Yes Shanker Levora Dredge, MD  sennosides-docusate sodium (SENOKOT-S) 8.6-50 MG tablet Take 2 tablets by mouth 2 (two) times daily.   Yes Historical Provider, MD  SPIRIVA HANDIHALER 18 MCG inhalation capsule Place 18 mcg into inhaler and inhale daily.  08/24/15  Yes Historical Provider, MD  zolpidem (AMBIEN) 5 MG tablet Take 1 tablet (5 mg total) by mouth at bedtime as needed for sleep. 11/25/15  Yes Shanker Levora Dredge, MD   PHYSICAL EXAM: Filed Vitals:   12/20/15 0556 12/20/15 0600 12/20/15 0645 12/20/15 0700  BP:  111/70 118/57 121/61  Pulse: 116 106 97 108  Resp: SpO2: 97% 96% 96% 96%    Wt Readings from Last 3 Encounters:  11/25/15 62.7 kg (138 lb 3.7 oz)  10/23/15 61.598 kg (135 lb 12.8 oz)  10/04/15 61.961 kg (136 lb 9.6 oz)    General:  Pleasant white female lying in bed on bipap. Appears comfortable.  Eyes: PER, normal lids, irises &  conjunctiva ENT: grossly normal hearing, lips & tongue Neck: no LAD, no masses Cardiovascular: Irregular rate and rhythm, tachycardic. 3+ left pedal edema, 2+ right pedall  edema.  Respiratory: Respirations slightly labored on bipap. Poor air movement, expiratory wheezing.    Abdomen: soft, non-distended, non-tender, active bowel sounds. No obvious masses.  Skin: no rash seen on limited exam Musculoskeletal: grossly normal tone BUE/BLE Psychiatric: grossly normal mood and affect, speech fluent and appropriate Neurologic: grossly non-focal.         LABS ON ADMISSION:    Basic Metabolic Panel:  Recent Labs Lab 12/20/15 0447  NA  143  K 3.4*  CL 97*  CO2 34*  GLUCOSE 124*  BUN 19  CREATININE 0.86  CALCIUM 8.6*   Liver Function Tests:  Recent Labs Lab 12/20/15 0447  AST 20  ALT 23  ALKPHOS 61  BILITOT 0.2*  PROT 6.3*  ALBUMIN 3.4*    CBC:  Recent Labs Lab 12/20/15 0447  WBC 16.4*  NEUTROABS 10.2*  HGB 11.4*  HCT 37.6  MCV 90.0  PLT 349    Radiological Exams on Admission: Dg Chest Port 1 View  12/20/2015  CLINICAL DATA:  Shortness of breath beginning last night. History of COPD and pneumonia, CHF. EXAM: PORTABLE CHEST 1 VIEW COMPARISON:  Chest radiograph November 23, 2015 FINDINGS: The cardiac silhouette is moderately enlarged unchanged. Mildly calcified aortic knob. Similar chronic interstitial changes increased lung volumes compatible with COPD. New small LEFT pleural effusion and LEFT midlung zone airspace opacity. No pneumothorax. Osteopenia. Soft tissues are normal. IMPRESSION: New small LEFT pleural effusion with LEFT midlung zone airspace opacity. Followup PA and lateral chest X-ray is recommended in 3-4 weeks following trial of antibiotic therapy to ensure resolution and exclude underlying malignancy. Stable cardiomegaly and COPD. Electronically Signed   By: Awilda Metroourtnay  Bloomer M.D.   On: 12/20/2015 04:42     ASSESSMENT / PLAN   HCAP. -admit to  Telemetry -HCAP order set utilized  Severe COPD, on home 02. -scheduled Duonebs, prn albuteral -wean off bipap when able. -hold home prednisone, give IV steroid Q 6 hours(consider deescalating tomorrow)  Leukocytosis, likely combiination of PNA and steroids  Atrial fibrillation with RVR. ChadsVasc 6 -telemetry bed -rate already coming down ( without meds) but restart home cardizem now and reassess -continue home Eliquis  Chronic diastolic heart failure.  -pedal edema on exam. Normal BNP -on lasix at home, will give IV lasix 40mg  now for now, resume home 40mg  PO in am.  -daily wts -I &0  Type II diabetes.  -hold home Glucotrol -CBG, SSI  Tobacco abuse.  -RN to provide smoking cessation    CONSULTANTS:    none  Code Status:  Partial code -Do not intubate DVT Prophylaxis: On Eliquis at home Family Communication:  Patient alert, oriented and understands plan of care.  Disposition Plan: Discharge to home in 2-3 days  Time spent: 60 minutes Willette ClusterPaula Abeer Iversen  NP [facility] Pager [phone]

## 2015-12-20 NOTE — Progress Notes (Signed)
Pt is currently off bi-pap and on a 4L [place]. RT will continue to monitor.

## 2015-12-20 NOTE — ED Notes (Addendum)
[facility] with c/o sob that began after going to bed last night, unrelieved after breathing trx's. Pt lives at home by herself, wears 2L [place] and pt's sats were 72% when Fire Dept arrived. Pt received 3 Albuterol trx's with EMS, and 125mg  Solumedrol.

## 2015-12-20 NOTE — ED Notes (Signed)
Lab at bedside

## 2015-12-21 LAB — CBC
HCT: 31.1 % — ABNORMAL LOW (ref 36.0–46.0)
HEMOGLOBIN: 9.2 g/dL — AB (ref 12.0–15.0)
MCH: 26 pg (ref 26.0–34.0)
MCHC: 29.6 g/dL — AB (ref 30.0–36.0)
MCV: 87.9 fL (ref 78.0–100.0)
Platelets: 304 10*3/uL (ref 150–400)
RBC: 3.54 MIL/uL — ABNORMAL LOW (ref 3.87–5.11)
RDW: 17.4 % — AB (ref 11.5–15.5)
WBC: 17.6 10*3/uL — ABNORMAL HIGH (ref 4.0–10.5)

## 2015-12-21 LAB — BASIC METABOLIC PANEL
ANION GAP: 12 (ref 5–15)
BUN: 21 mg/dL — AB (ref 6–20)
CHLORIDE: 93 mmol/L — AB (ref 101–111)
CO2: 34 mmol/L — ABNORMAL HIGH (ref 22–32)
Calcium: 8.8 mg/dL — ABNORMAL LOW (ref 8.9–10.3)
Creatinine, Ser: 1.14 mg/dL — ABNORMAL HIGH (ref 0.44–1.00)
GFR, EST AFRICAN AMERICAN: 54 mL/min — AB (ref >60)
GFR, EST NON AFRICAN AMERICAN: 46 mL/min — AB (ref >60)
Glucose, Bld: 189 mg/dL — ABNORMAL HIGH (ref 65–99)
POTASSIUM: 3.8 mmol/L (ref 3.5–5.1)
SODIUM: 139 mmol/L (ref 135–145)

## 2015-12-21 LAB — GLUCOSE, CAPILLARY: GLUCOSE-CAPILLARY: 142 mg/dL — AB (ref 65–99)

## 2015-12-21 MED ORDER — IPRATROPIUM-ALBUTEROL 0.5-2.5 (3) MG/3ML IN SOLN
3.0000 mL | Freq: Four times a day (QID) | RESPIRATORY_TRACT | Status: DC
  Administered 2015-12-21 – 2015-12-24 (×13): 3 mL via RESPIRATORY_TRACT
  Filled 2015-12-21 (×13): qty 3

## 2015-12-21 NOTE — Care Management Note (Signed)
Case Management Note  Patient Details  Name: Brenda Wells MRN: [id number] Date of Birth: [birth year]  Subjective/Objective:  [facility], she would like to cont with them at discharge, she has home oxygen thru [employer] 3 liters at home, presents with hcap and pl effusion on bipap.  NCM will cont to follow for dc needs                   Action/Plan:   Expected Discharge Date:                  Expected Discharge Plan:  Home w Home Health Services  In-House Referral:     Discharge planning Services  CM Consult  Post Acute Care Choice:    Choice offered to:     DME Arranged:    DME Agency:     HH Arranged:    HH Agency:     Status of Service:  In process, will continue to follow  Medicare Important Message Given:    Date Medicare IM Given:    Medicare IM give by:    Date Additional Medicare IM Given:    Additional Medicare Important Message give by:     If discussed at Long Length of Stay Meetings, dates discussed:    Additional Comments:  Leone Havenaylor, Amarria Andreasen Clinton, RN 12/21/2015, 4:14 PM

## 2015-12-21 NOTE — Progress Notes (Signed)
[facility] PROGRESS NOTE  Brenda Wells WUJ:[id number] DOB: [birth year] DOA: 12/20/2015 PCP: [name],[name], MD  Assessment/Plan: Acute on chronic hypercapnic, respiratory failure Multifactorial, COPD exacerbation , in setting current smoker, PNA  HCAP. -IV vancomycin and cefepime.   Severe COPD, on home 02. -scheduled Duonebs, prn albuterol -wean off bipap when able. -IV steroids.   Leukocytosis, likely combiination of PNA and steroids  Atrial fibrillation with RVR. ChadsVasc 6 -on cardizem.  -continue home Eliquis  Chronic diastolic heart failure.  -pedal edema on exam. Normal BNP -received one time dose IV lasix in the ed.   resume home 40mg  POtoday  -daily wts -I &0  Type II diabetes.  -hold home Glucotrol -CBG, SSI  Tobacco abuse.  -RN to provide smoking cessation   Code Status: Partial Code.  Family Communication: patient, no family at time of my evaluation  Disposition Plan: remain in the step down    Consultants:  none  Procedures:  none  Antibiotics:  Cefepime  Vancomycin   HPI/Subjective: Feeling better, she is breathing better   Objective: Filed Vitals:   12/21/15 0335 12/21/15 0758  BP: 106/62   Pulse: 99   Temp:  98.5 F (36.9 C)  Resp: 26     Intake/Output Summary (Last 24 hours) at 12/21/15 0804 Last data filed at 12/20/15 2300  Gross per 24 hour  Intake     60 ml  Output    600 ml  Net   -540 ml   Filed Weights   12/20/15 1751 12/21/15 0500  Weight: 62.5 kg (137 lb 12.6 oz) 62.6 kg (138 lb 0.1 oz)    Exam:   General:  NAD  Cardiovascular: S 1, S 2 RRR  Respiratory: B/L crackles, B/L wheezing.   Abdomen: BS present, soft, nt  Musculoskeletal: no edema   Data Reviewed: Basic Metabolic Panel:  Recent Labs Lab 12/20/15 0447 12/21/15 0325  NA 143 139  K 3.4* 3.8  CL 97* 93*  CO2 34* 34*  GLUCOSE 124* 189*  BUN 19 21*  CREATININE 0.86 1.14*  CALCIUM 8.6* 8.8*   Liver Function  Tests:  Recent Labs Lab 12/20/15 0447  AST 20  ALT 23  ALKPHOS 61  BILITOT 0.2*  PROT 6.3*  ALBUMIN 3.4*   No results for input(s): LIPASE, AMYLASE in the last 168 hours. No results for input(s): AMMONIA in the last 168 hours. CBC:  Recent Labs Lab 12/20/15 0447 12/21/15 0325  WBC 16.4* 17.6*  NEUTROABS 10.2*  --   HGB 11.4* 9.2*  HCT 37.6 31.1*  MCV 90.0 87.9  PLT 349 304   Cardiac Enzymes:  Recent Labs Lab 12/20/15 0447  TROPONINI 0.03   BNP (last 3 results)  Recent Labs  11/19/15 0058 11/21/15 0402 12/20/15 0448  BNP 90.0 284.2* 68.6    ProBNP (last 3 results) No results for input(s): PROBNP in the last 8760 hours.  CBG:  Recent Labs Lab 12/20/15 1248 12/20/15 2116  GLUCAP 178* 206*    Recent Results (from the past 240 hour(s))  MRSA PCR Screening     Status: None   Collection Time: 12/20/15  5:41 PM  Result Value Ref Range Status   MRSA by PCR NEGATIVE NEGATIVE Final    Comment:        The GeneXpert MRSA Assay (FDA approved for NASAL specimens only), is one component of a comprehensive MRSA colonization surveillance program. It is not intended to diagnose MRSA infection nor to guide or monitor treatment for MRSA infections.  Studies: Dg Chest Port 1 View  12/20/2015  CLINICAL DATA:  Shortness of breath beginning last night. History of COPD and pneumonia, CHF. EXAM: PORTABLE CHEST 1 VIEW COMPARISON:  Chest radiograph November 23, 2015 FINDINGS: The cardiac silhouette is moderately enlarged unchanged. Mildly calcified aortic knob. Similar chronic interstitial changes increased lung volumes compatible with COPD. New small LEFT pleural effusion and LEFT midlung zone airspace opacity. No pneumothorax. Osteopenia. Soft tissues are normal. IMPRESSION: New small LEFT pleural effusion with LEFT midlung zone airspace opacity. Followup PA and lateral chest X-ray is recommended in 3-4 weeks following trial of antibiotic therapy to ensure resolution  and exclude underlying malignancy. Stable cardiomegaly and COPD. Electronically Signed   By: Awilda Metro M.D.   On: 12/20/2015 04:42    Scheduled Meds: . apixaban  5 mg Oral BID  . ceFEPime (MAXIPIME) IV  2 g Intravenous Q24H  . diltiazem  360 mg Oral Daily  . feeding supplement (GLUCERNA SHAKE)  237 mL Oral TID BM  . guaiFENesin  600 mg Oral BID  . insulin aspart  0-15 Units Subcutaneous TID WC  . ipratropium-albuterol  3 mL Nebulization QID  . loratadine  10 mg Oral Daily  . methylPREDNISolone (SOLU-MEDROL) injection  40 mg Intravenous Q6H  . mometasone-formoterol  2 puff Inhalation BID  . pantoprazole  40 mg Oral Daily  . senna-docusate  1 tablet Oral BID  . vancomycin  500 mg Intravenous Q12H   Continuous Infusions:   Active Problems:   Chronic diastolic (congestive) heart failure (HCC)   Atrial fibrillation with RVR (HCC)   Leukocytosis   HCAP (healthcare-associated pneumonia)   Type II diabetes mellitus (HCC)    Time spent: 35 minutes.     Hartley Barefoot A  [facility] Pager [phone]. If 7PM-7AM, please contact night-coverage at [email or web address], password [facility] 12/21/2015, 8:04 AM  LOS: 1 day

## 2015-12-22 LAB — GLUCOSE, CAPILLARY
GLUCOSE-CAPILLARY: 187 mg/dL — AB (ref 65–99)
GLUCOSE-CAPILLARY: 223 mg/dL — AB (ref 65–99)
Glucose-Capillary: 150 mg/dL — ABNORMAL HIGH (ref 65–99)
Glucose-Capillary: 209 mg/dL — ABNORMAL HIGH (ref 65–99)
Glucose-Capillary: 239 mg/dL — ABNORMAL HIGH (ref 65–99)
Glucose-Capillary: 218 mg/dL — ABNORMAL HIGH (ref 65–99)

## 2015-12-22 LAB — VANCOMYCIN, TROUGH: Vancomycin Tr: 19 ug/mL (ref 10.0–20.0)

## 2015-12-22 MED ORDER — SODIUM CHLORIDE 0.9 % IV BOLUS (SEPSIS): 500.0000 mL | Freq: Once | INTRAVENOUS | Status: DC

## 2015-12-22 MED ORDER — GLUCERNA SHAKE PO LIQD
237.0000 mL | Freq: Three times a day (TID) | ORAL | Status: DC
  Administered 2015-12-22 – 2015-12-27 (×10): 237 mL via ORAL
  Filled 2015-12-22 (×11): qty 237

## 2015-12-22 NOTE — Progress Notes (Signed)
Pharmacy Antibiotic Note  Brenda Wells is a 75 y.o. female admitted on 12/20/2015 with pneumonia.  WBC 17.6, afebrile, bump in SCr to 1.14. Vancomycin trough today is 19 (~11 hr level). Will continue with current dose for now.   Plan: -Vancomycin 500 mg IV q12h -Cefepime 2 g IV q24h -Monitor renal fx, cultures, duration of therapy, VT as needed    Recent Labs Lab 12/20/15 0445 12/20/15 0447 12/21/15 0325 12/22/15 0830  WBC  --  16.4* 17.6*  --   CREATININE  --  0.86 1.14*  --   LATICACIDVEN 1.38  --   --   --   VANCOTROUGH  --   --   --  19    Estimated Creatinine Clearance: 39 mL/min (by C-G formula based on Cr of 1.14).    No Known Allergies  Antimicrobials this admission:  4/3 cefepime > 4/3 vancomycin >  Dose adjustments this admission: N/A  Microbiology results: 4/3 blood cx: ngtd 4/3 MRSA PCR neg   Thank you for allowing us to participate in this patients care. Signe Coltonya C Kaley Jutras, PharmD Pager: [phone] 12/22/2015 9:41 AM

## 2015-12-22 NOTE — Progress Notes (Signed)
[facility] PROGRESS NOTE  Brenda Wells ZOX:[id number] DOB: [birth year] DOA: 12/20/2015 PCP: [name], MD  Assessment/Plan:  Acute on chronic hypercapnic, hypoxic respiratory failure Multifactorial, COPD exacerbation , in setting current smoker, PNA  HCAP. -IV vancomycin and cefepime.   Severe COPD, on home 02. -scheduled Duonebs, prn albuterol -wean off bipap when able. -IV steroids.   Leukocytosis, likely combiination of PNA and steroids  Atrial fibrillation with RVR. ChadsVasc 6 -on cardizem.  -continue home Eliquis  Chronic diastolic heart failure.  -pedal edema on exam. Normal BNP -received one time dose IV lasix in the ed.   resume home  POtoday  -daily wts -I &0  Type II diabetes.  -hold home Glucotrol -CBG, SSI  Tobacco abuse.  -RN to provide smoking cessation   Code Status: Partial Code. Patient was DNR, has been on home hospice, she now states she is partial code, no intubation, yes to cpr and defibrillation, palliative care consulted for further clarifying goal of care which patient agreed to. Family Communication: patient, no family at time of my evaluation  Disposition Plan: remain in the step down    Consultants:  Palliative care  Procedures:  none  Antibiotics:  Cefepime  Vancomycin   HPI/Subjective: States feeling ok at rest, but sob with minimal exertion.   Objective: Filed Vitals:   12/22/15 1918 12/22/15 1951  BP: 149/63   Pulse: 124 102  Temp: 97.6 F (36.4 C)   Resp: 22 22    Intake/Output Summary (Last 24 hours) at 12/22/15 2142 Last data filed at 12/22/15 1922  Gross per 24 hour  Intake    960 ml  Output    650 ml  Net    310 ml   Filed Weights   12/20/15 1751 12/21/15 0500 12/22/15 0400  Weight: 62.5 kg (137 lb 12.6 oz) 62.6 kg (138 lb 0.1 oz) 65.59 kg (144 lb 9.6 oz)    Exam:   General:  NAD  Cardiovascular: S 1, S 2 RRR  Respiratory: B/L crackles, B/L wheezing.   Abdomen: BS  present, soft, nt  Musculoskeletal: Pitting bilateral ankle edema   Data Reviewed: Basic Metabolic Panel:  Recent Labs Lab 12/20/15 0447 12/21/15 0325  NA 143 139  K 3.4* 3.8  CL 97* 93*  CO2 34* 34*  GLUCOSE 124* 189*  BUN 19 21*  CREATININE 0.86 1.14*  CALCIUM 8.6* 8.8*   Liver Function Tests:  Recent Labs Lab 12/20/15 0447  AST 20  ALT 23  ALKPHOS 61  BILITOT 0.2*  PROT 6.3*  ALBUMIN 3.4*   No results for input(s): LIPASE, AMYLASE in the last 168 hours. No results for input(s): AMMONIA in the last 168 hours. CBC:  Recent Labs Lab 12/20/15 0447 12/21/15 0325  WBC 16.4* 17.6*  NEUTROABS 10.2*  --   HGB 11.4* 9.2*  HCT 37.6 31.1*  MCV 90.0 87.9  PLT 349 304   Cardiac Enzymes:  Recent Labs Lab 12/20/15 0447  TROPONINI 0.03   BNP (last 3 results)  Recent Labs  11/19/15 0058 11/21/15 0402 12/20/15 0448  BNP 90.0 284.2* 68.6    ProBNP (last 3 results) No results for input(s): PROBNP in the last 8760 hours.  CBG:  Recent Labs Lab 12/21/15 2252 12/22/15 0751 12/22/15 1108 12/22/15 1627 12/22/15 2129  GLUCAP 209* 187* 223* 239* 218*    Recent Results (from the past 240 hour(s))  Culture, blood (Routine X 2) w Reflex to ID Panel     Status: None (Preliminary result)  Collection Time: 12/20/15  5:12 AM  Result Value Ref Range Status   Specimen Description BLOOD RIGHT ANTECUBITAL  Final   Special Requests   Final    BOTTLES DRAWN AEROBIC AND ANAEROBIC 5CC RED 10CC BLUE   Culture NO GROWTH 2 DAYS  Final   Report Status PENDING  Incomplete  Culture, blood (Routine X 2) w Reflex to ID Panel     Status: None (Preliminary result)   Collection Time: 12/20/15  5:15 AM  Result Value Ref Range Status   Specimen Description BLOOD LEFT HAND  Final   Special Requests BOTTLES DRAWN AEROBIC AND ANAEROBIC 5CC   Final   Culture NO GROWTH 2 DAYS  Final   Report Status PENDING  Incomplete  MRSA PCR Screening     Status: None   Collection Time:  12/20/15  5:41 PM  Result Value Ref Range Status   MRSA by PCR NEGATIVE NEGATIVE Final    Comment:        The GeneXpert MRSA Assay (FDA approved for NASAL specimens only), is one component of a comprehensive MRSA colonization surveillance program. It is not intended to diagnose MRSA infection nor to guide or monitor treatment for MRSA infections.      Studies: No results found.  Scheduled Meds: . apixaban  5 mg Oral BID  . ceFEPime (MAXIPIME) IV  2 g Intravenous Q24H  . diltiazem  360 mg Oral Daily  . feeding supplement (GLUCERNA SHAKE)  237 mL Oral TID BM  . guaiFENesin  600 mg Oral BID  . insulin aspart  0-15 Units Subcutaneous TID WC  . ipratropium-albuterol  3 mL Nebulization QID  . loratadine  10 mg Oral Daily  . methylPREDNISolone (SOLU-MEDROL) injection  40 mg Intravenous Q6H  . mometasone-formoterol  2 puff Inhalation BID  . pantoprazole  40 mg Oral Daily  . senna-docusate  1 tablet Oral BID  . vancomycin  500 mg Intravenous Q12H   Continuous Infusions:   Active Problems:   Chronic diastolic (congestive) heart failure (HCC)   Atrial fibrillation with RVR (HCC)   Leukocytosis   HCAP (healthcare-associated pneumonia)   Type II diabetes mellitus (HCC)    Time spent: 35 minutes.     Deshannon Hinchliffe MD PhD  [facility] Pager [phone]. If 7PM-7AM, please contact night-coverage at [email or web address], password [email or web address] 12/22/2015, 9:42 PM  LOS: 2 days

## 2015-12-23 LAB — CBC
HCT: 31.5 % — ABNORMAL LOW (ref 36.0–46.0)
Hemoglobin: 9.3 g/dL — ABNORMAL LOW (ref 12.0–15.0)
MCH: 26 pg (ref 26.0–34.0)
MCHC: 29.5 g/dL — AB (ref 30.0–36.0)
MCV: 88 fL (ref 78.0–100.0)
PLATELETS: 331 10*3/uL (ref 150–400)
RBC: 3.58 MIL/uL — ABNORMAL LOW (ref 3.87–5.11)
RDW: 17.5 % — AB (ref 11.5–15.5)
WBC: 17.2 10*3/uL — AB (ref 4.0–10.5)

## 2015-12-23 LAB — BASIC METABOLIC PANEL
ANION GAP: 14 (ref 5–15)
BUN: 35 mg/dL — ABNORMAL HIGH (ref 6–20)
CALCIUM: 8.8 mg/dL — AB (ref 8.9–10.3)
CO2: 30 mmol/L (ref 22–32)
CREATININE: 0.99 mg/dL (ref 0.44–1.00)
Chloride: 96 mmol/L — ABNORMAL LOW (ref 101–111)
GFR, EST NON AFRICAN AMERICAN: 55 mL/min — AB (ref >60)
Glucose, Bld: 223 mg/dL — ABNORMAL HIGH (ref 65–99)
Potassium: 4.5 mmol/L (ref 3.5–5.1)
SODIUM: 140 mmol/L (ref 135–145)

## 2015-12-23 LAB — GLUCOSE, CAPILLARY
Glucose-Capillary: 219 mg/dL — ABNORMAL HIGH (ref 65–99)
Glucose-Capillary: 224 mg/dL — ABNORMAL HIGH (ref 65–99)
Glucose-Capillary: 223 mg/dL — ABNORMAL HIGH (ref 65–99)
Glucose-Capillary: 206 mg/dL — ABNORMAL HIGH (ref 65–99)

## 2015-12-23 MED ORDER — DOXYCYCLINE HYCLATE 100 MG PO TABS
100.0000 mg | ORAL_TABLET | Freq: Two times a day (BID) | ORAL | Status: DC
  Administered 2015-12-23 – 2015-12-27 (×9): 100 mg via ORAL
  Filled 2015-12-23 (×9): qty 1

## 2015-12-23 MED ORDER — PREDNISONE 50 MG PO TABS
60.0000 mg | ORAL_TABLET | Freq: Two times a day (BID) | ORAL | Status: DC
  Administered 2015-12-23 – 2015-12-26 (×6): 60 mg via ORAL
  Filled 2015-12-23 (×6): qty 1

## 2015-12-23 MED ORDER — FUROSEMIDE 40 MG PO TABS
40.0000 mg | ORAL_TABLET | Freq: Every day | ORAL | Status: DC
  Administered 2015-12-23 – 2015-12-27 (×5): 40 mg via ORAL
  Filled 2015-12-23 (×5): qty 1

## 2015-12-23 NOTE — Consult Note (Signed)
   [facility]   12/23/2015  Brenda Wells October 16, 1940 [id number] Patient screened for potential [facility] Care Management services. This is the patient's 7th admission per chart in the past 6 months.  [facility]. Spoke with inpatient RNCM that the patient is eligible  for community care management is needed. Inpatient RNCM states patient has been under her Hospice benefit and was receiving hospice care at home.  Chart review indicates that the physician has order a palliative consult.  Will follow up for potential [facility] Care [facility] needs.   Please place a [facility] consult or for questions contact:   Charlesetta ShanksVictoria Mauriah Mcmillen, RN BSN CCM [facility] Liaison  [phone] business mobile phone Toll free office [phone]

## 2015-12-23 NOTE — Progress Notes (Signed)
Inpatient Diabetes Program Recommendations  AACE/ADA: New Consensus Statement on Inpatient Glycemic Control (2015)  Target Ranges:  Prepandial:   less than 140 mg/dL      Peak postprandial:   less than 180 mg/dL (1-2 hours)      Critically ill patients:  140 - 180 mg/dL   Review of Glycemic Control  Diabetes history: DM Type 2 Outpatient Diabetes medications: Glipizide 5 mg q d Current orders for Inpatient glycemic control: Novolog Moderate correction scale 0-15 tid  Inpatient Diabetes Program Recommendations:  Please consider Novolog 4 units tid with meals (0.05 units/kg) & hold if eats < 50 %.  Thank you, Billy FischerJudy E. Clydette Privitera, RN, MSN, CDE Inpatient Glycemic Control Team Team Pager [phone] (8am-5pm) 12/23/2015 9:39 AM

## 2015-12-23 NOTE — Progress Notes (Signed)
Palliative Medicine RN Note: Called family to set up meeting for tomorrow. No answer at home or at [facility] and no family in hospital. Will attempt again tomorrow. Donn PieriniMelanie G. Oliver, RN, BSN, [facility] 12/23/2015 3:17 PM Cell [phone] 8:00-4:00 Monday-Friday Office [phone]

## 2015-12-23 NOTE — Progress Notes (Signed)
[facility] PROGRESS NOTE  Brenda Wells ZOX:[id number] DOB: [birth year] DOA: 12/20/2015 PCP: [name],[name], MD  Assessment/Plan:  Acute on chronic hypercapnic, hypoxic respiratory failure Multifactorial, COPD exacerbation , in setting current smoker, PNA  HCAP. -IV vancomycin and cefepime.  Lost iv access on 4/6, seems a bit fluids overload, will change abx to oral doxycycline,   Severe COPD, on home 02. -scheduled Duonebs, prn albuterol -wean off bipap when able. -IV steroids. Change to oral prednisone  Leukocytosis, likely combiination of PNA and steroids  Atrial fibrillation with RVR. ChadsVasc 6 -on cardizem.  -continue home Eliquis  Chronic diastolic heart failure.  -pedal edema on exam. Normal BNP -received one time dose IV lasix in the ed.   resume home 40mg  PO today on 4/6   Type II diabetes.  -hold home Glucotrol -CBG, SSI  Tobacco abuse.  -RN to provide smoking cessation   Code Status: Partial Code. Patient was DNR, has been on home hospice, she now states she is partial code, no intubation, yes to cpr and defibrillation, palliative care consulted for further clarifying goal of care which patient agreed to. Family Communication: patient, no family at time of my evaluation  Disposition Plan: remain in the step down    Consultants:  Palliative care  Procedures:  none  Antibiotics:  Cefepime from admission to 4/6  Vancomycin from admission to 4/6  HPI/Subjective: Feel worse this am, + wheezing, lost iv access.   Objective: Filed Vitals:   12/23/15 0439 12/23/15 0731  BP: 136/61 120/59  Pulse: 105 73  Temp: 97.1 F (36.2 C) 97.5 F (36.4 C)  Resp: 28 20    Intake/Output Summary (Last 24 hours) at 12/23/15 0903 Last data filed at 12/23/15 0448  Gross per 24 hour  Intake    820 ml  Output   1100 ml  Net   -280 ml   Filed Weights   12/21/15 0500 12/22/15 0400 12/23/15 0400  Weight: 62.6 kg (138 lb 0.1 oz) 65.59 kg (144 lb 9.6  oz) 67.2 kg (148 lb 2.4 oz)    Exam:   General:  NAD  Cardiovascular: S 1, S 2 RRR  Respiratory: B/L crackles, B/L wheezing.   Abdomen: BS present, soft, nt  Musculoskeletal: Pitting bilateral ankle edema   Data Reviewed: Basic Metabolic Panel:  Recent Labs Lab 12/20/15 0447 12/21/15 0325 12/23/15 0444  NA 143 139 140  K 3.4* 3.8 4.5  CL 97* 93* 96*  CO2 34* 34* 30  GLUCOSE 124* 189* 223*  BUN 19 21* 35*  CREATININE 0.86 1.14* 0.99  CALCIUM 8.6* 8.8* 8.8*   Liver Function Tests:  Recent Labs Lab 12/20/15 0447  AST 20  ALT 23  ALKPHOS 61  BILITOT 0.2*  PROT 6.3*  ALBUMIN 3.4*   No results for input(s): LIPASE, AMYLASE in the last 168 hours. No results for input(s): AMMONIA in the last 168 hours. CBC:  Recent Labs Lab 12/20/15 0447 12/21/15 0325 12/23/15 0444  WBC 16.4* 17.6* 17.2*  NEUTROABS 10.2*  --   --   HGB 11.4* 9.2* 9.3*  HCT 37.6 31.1* 31.5*  MCV 90.0 87.9 88.0  PLT 349 304 331   Cardiac Enzymes:  Recent Labs Lab 12/20/15 0447  TROPONINI 0.03   BNP (last 3 results)  Recent Labs  11/19/15 0058 11/21/15 0402 12/20/15 0448  BNP 90.0 284.2* 68.6    ProBNP (last 3 results) No results for input(s): PROBNP in the last 8760 hours.  CBG:  Recent Labs Lab 12/21/15  2252 12/22/15 0751 12/22/15 1108 12/22/15 1627 12/22/15 2129  GLUCAP 209* 187* 223* 239* 218*    Recent Results (from the past 240 hour(s))  Culture, blood (Routine X 2) w Reflex to ID Panel     Status: None (Preliminary result)   Collection Time: 12/20/15  5:12 AM  Result Value Ref Range Status   Specimen Description BLOOD RIGHT ANTECUBITAL  Final   Special Requests   Final    BOTTLES DRAWN AEROBIC AND ANAEROBIC 5CC RED 10CC BLUE   Culture NO GROWTH 2 DAYS  Final   Report Status PENDING  Incomplete  Culture, blood (Routine X 2) w Reflex to ID Panel     Status: None (Preliminary result)   Collection Time: 12/20/15  5:15 AM  Result Value Ref Range Status    Specimen Description BLOOD LEFT HAND  Final   Special Requests BOTTLES DRAWN AEROBIC AND ANAEROBIC 5CC   Final   Culture NO GROWTH 2 DAYS  Final   Report Status PENDING  Incomplete  MRSA PCR Screening     Status: None   Collection Time: 12/20/15  5:41 PM  Result Value Ref Range Status   MRSA by PCR NEGATIVE NEGATIVE Final    Comment:        The GeneXpert MRSA Assay (FDA approved for NASAL specimens only), is one component of a comprehensive MRSA colonization surveillance program. It is not intended to diagnose MRSA infection nor to guide or monitor treatment for MRSA infections.      Studies: No results found.  Scheduled Meds: . apixaban  5 mg Oral BID  . diltiazem  360 mg Oral Daily  . doxycycline  100 mg Oral Q12H  . feeding supplement (GLUCERNA SHAKE)  237 mL Oral TID BM  . furosemide  40 mg Oral Daily  . guaiFENesin  600 mg Oral BID  . insulin aspart  0-15 Units Subcutaneous TID WC  . ipratropium-albuterol  3 mL Nebulization QID  . loratadine  10 mg Oral Daily  . mometasone-formoterol  2 puff Inhalation BID  . pantoprazole  40 mg Oral Daily  . predniSONE  60 mg Oral BID WC  . senna-docusate  1 tablet Oral BID   Continuous Infusions:   Active Problems:   Chronic diastolic (congestive) heart failure (HCC)   Atrial fibrillation with RVR (HCC)   Leukocytosis   HCAP (healthcare-associated pneumonia)   Type II diabetes mellitus (HCC)    Time spent: 35 minutes.     Ezrie Bunyan MD PhD  [facility] Pager [phone]. If 7PM-7AM, please contact night-coverage at [email or web address], password [facility] 12/23/2015, 9:03 AM  LOS: 3 days

## 2015-12-23 NOTE — Discharge Instructions (Addendum)

## 2015-12-23 NOTE — Care Management Important Message (Signed)
Important Message  Patient Details  Name: Brenda Wells MRN: [id number] Date of Birth: Mar 26, 1941   Medicare Important Message Given:  Yes    Melina Mosteller [name] 12/23/2015, 11:02 AM

## 2015-12-24 DIAGNOSIS — Z7189 Other specified counseling: Secondary | ICD-10-CM

## 2015-12-24 DIAGNOSIS — D72829 Elevated white blood cell count, unspecified: Secondary | ICD-10-CM

## 2015-12-24 DIAGNOSIS — Z515 Encounter for palliative care: Secondary | ICD-10-CM | POA: Insufficient documentation

## 2015-12-24 DIAGNOSIS — Z789 Other specified health status: Secondary | ICD-10-CM

## 2015-12-24 LAB — GLUCOSE, CAPILLARY
GLUCOSE-CAPILLARY: 186 mg/dL — AB (ref 65–99)
GLUCOSE-CAPILLARY: 241 mg/dL — AB (ref 65–99)
GLUCOSE-CAPILLARY: 257 mg/dL — AB (ref 65–99)
Glucose-Capillary: 241 mg/dL — ABNORMAL HIGH (ref 65–99)
Glucose-Capillary: 169 mg/dL — ABNORMAL HIGH (ref 65–99)

## 2015-12-24 MED ORDER — IPRATROPIUM BROMIDE 0.02 % IN SOLN
0.5000 mg | Freq: Four times a day (QID) | RESPIRATORY_TRACT | Status: DC
  Administered 2015-12-24 – 2015-12-26 (×8): 0.5 mg via RESPIRATORY_TRACT
  Filled 2015-12-24 (×8): qty 2.5

## 2015-12-24 MED ORDER — LEVALBUTEROL HCL 1.25 MG/0.5ML IN NEBU
1.2500 mg | INHALATION_SOLUTION | Freq: Four times a day (QID) | RESPIRATORY_TRACT | Status: DC
  Filled 2015-12-24: qty 0.5

## 2015-12-24 MED ORDER — FAMOTIDINE 20 MG PO TABS
20.0000 mg | ORAL_TABLET | Freq: Every day | ORAL | Status: DC
  Administered 2015-12-24 – 2015-12-26 (×3): 20 mg via ORAL
  Filled 2015-12-24 (×3): qty 1

## 2015-12-24 MED ORDER — FUROSEMIDE 40 MG PO TABS
40.0000 mg | ORAL_TABLET | Freq: Once | ORAL | Status: AC
  Administered 2015-12-24: 40 mg via ORAL
  Filled 2015-12-24: qty 1

## 2015-12-24 MED ORDER — LEVALBUTEROL HCL 1.25 MG/0.5ML IN NEBU
1.2500 mg | INHALATION_SOLUTION | Freq: Four times a day (QID) | RESPIRATORY_TRACT | Status: DC
  Administered 2015-12-24 – 2015-12-26 (×8): 1.25 mg via RESPIRATORY_TRACT
  Filled 2015-12-24 (×7): qty 0.5

## 2015-12-24 MED ORDER — FUROSEMIDE 10 MG/ML IJ SOLN
40.0000 mg | Freq: Once | INTRAMUSCULAR | Status: DC
  Filled 2015-12-24: qty 4
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
## 2015-12-24 NOTE — Progress Notes (Signed)
[facility] PROGRESS NOTE  Brenda Wells:[id number] DOB: 05/06/1941 DOA: 12/20/2015 PCP: [name], MD  Assessment/Plan:  Acute on chronic hypercapnic, hypoxic respiratory failure Multifactorial, COPD exacerbation , in setting current smoker, PNA, required bipap initially on admission Better on 4/7  HCAP. -IV vancomycin and cefepime.  Lost iv access on 4/6, seems a bit fluids overload, changed to oral abx doxycycline on 4/6  Severe COPD, on home 02. -scheduled Duonebs, prn albuterol, change to xopenex/atrovent due to tachycardia -IV steroids. Change to oral prednisone  Leukocytosis, likely combiination of PNA and steroids  Atrial fibrillation with RVR. ChadsVasc 6 -on cardizem.  -continue home Eliquis  Chronic diastolic heart failure.  -pedal edema on exam. Normal BNP -received one time dose IV lasix in the ed.   resume home  PO today on 4/6 , additional dose of iv lasix on 4/7 due to persistent bilateral ankle/pedal edema  Type II diabetes.  -hold home Glucotrol -CBG, SSI  Tobacco abuse.  -RN to provide smoking cessation   Code Status: Partial Code. Patient was DNR, has been on home hospice, she now states she is partial code, no intubation, yes to cpr and defibrillation, palliative care consulted for further clarifying goal of care which patient agreed to. Family Communication: patient, no family at time of my evaluation  Disposition Plan: improving, transfer to tele, final disposition pending palliative care team input   Consultants:  Palliative care  Procedures:  none  Antibiotics:  Cefepime from admission to 4/6  Vancomycin from admission to 4/6  Doxycycline from 4/6-  HPI/Subjective: Feel better this am, less wheezing, persistent ankle/pedal edema, tachycardia  Objective: Filed Vitals:   12/24/15 0756 12/24/15 1213  BP: 129/72 125/55  Pulse: 128 112  Temp: 97.9 F (36.6 C) 98.1 F (36.7 C)  Resp: 22 27     Intake/Output Summary (Last 24 hours) at 12/24/15 1303 Last data filed at 12/24/15 1245  Gross per 24 hour  Intake    840 ml  Output   1250 ml  Net   -410 ml   Filed Weights   12/22/15 0400 12/23/15 0400 12/24/15 0356  Weight: 65.59 kg (144 lb 9.6 oz) 67.2 kg (148 lb 2.4 oz) 67.3 kg (148 lb 5.9 oz)    Exam:   General:  NAD  Cardiovascular: S 1, S 2 IRRR  Respiratory: B/L crackles, B/L wheezing has largely resolved, now overall very diminished  Abdomen: BS present, soft, nt  Musculoskeletal: Pitting bilateral ankle/pedal edema   Data Reviewed: Basic Metabolic Panel:  Recent Labs Lab 12/20/15 0447 12/21/15 0325 12/23/15 0444  NA 143 139 140  K 3.4* 3.8 4.5  CL 97* 93* 96*  CO2 34* 34* 30  GLUCOSE 124* 189* 223*  BUN 19 21* 35*  CREATININE 0.86 1.14* 0.99  CALCIUM 8.6* 8.8* 8.8*   Liver Function Tests:  Recent Labs Lab 12/20/15 0447  AST 20  ALT 23  ALKPHOS 61  BILITOT 0.2*  PROT 6.3*  ALBUMIN 3.4*   No results for input(s): LIPASE, AMYLASE in the last 168 hours. No results for input(s): AMMONIA in the last 168 hours. CBC:  Recent Labs Lab 12/20/15 0447 12/21/15 0325 12/23/15 0444  WBC 16.4* 17.6* 17.2*  NEUTROABS 10.2*  --   --   HGB 11.4* 9.2* 9.3*  HCT 37.6 31.1* 31.5*  MCV 90.0 87.9 88.0  PLT 349 304 331   Cardiac Enzymes:  Recent Labs Lab 12/20/15 0447  TROPONINI 0.03   BNP (last 3 results)  Recent  Labs  11/19/15 0058 11/21/15 0402 12/20/15 0448  BNP 90.0 284.2* 68.6    ProBNP (last 3 results) No results for input(s): PROBNP in the last 8760 hours.  CBG:  Recent Labs Lab 12/23/15 1140 12/23/15 1612 12/23/15 2115 12/24/15 0820 12/24/15 1212  GLUCAP 224* 223* 206* 186* 241*    Recent Results (from the past 240 hour(s))  Culture, blood (Routine X 2) w Reflex to ID Panel     Status: None (Preliminary result)   Collection Time: 12/20/15  5:12 AM  Result Value Ref Range Status   Specimen Description BLOOD  RIGHT ANTECUBITAL  Final   Special Requests   Final    BOTTLES DRAWN AEROBIC AND ANAEROBIC 5CC RED 10CC BLUE   Culture NO GROWTH 3 DAYS  Final   Report Status PENDING  Incomplete  Culture, blood (Routine X 2) w Reflex to ID Panel     Status: None (Preliminary result)   Collection Time: 12/20/15  5:15 AM  Result Value Ref Range Status   Specimen Description BLOOD LEFT HAND  Final   Special Requests BOTTLES DRAWN AEROBIC AND ANAEROBIC 5CC   Final   Culture NO GROWTH 3 DAYS  Final   Report Status PENDING  Incomplete  MRSA PCR Screening     Status: None   Collection Time: 12/20/15  5:41 PM  Result Value Ref Range Status   MRSA by PCR NEGATIVE NEGATIVE Final    Comment:        The GeneXpert MRSA Assay (FDA approved for NASAL specimens only), is one component of a comprehensive MRSA colonization surveillance program. It is not intended to diagnose MRSA infection nor to guide or monitor treatment for MRSA infections.      Studies: No results found.  Scheduled Meds: . apixaban  5 mg Oral BID  . diltiazem  360 mg Oral Daily  . doxycycline  100 mg Oral Q12H  . feeding supplement (GLUCERNA SHAKE)  237 mL Oral TID BM  . furosemide  40 mg Intravenous Once  . furosemide  40 mg Oral Daily  . guaiFENesin  600 mg Oral BID  . insulin aspart  0-15 Units Subcutaneous TID WC  . ipratropium  0.5 mg Nebulization Q6H  . levalbuterol  1.25 mg Nebulization 4 times per day  . loratadine  10 mg Oral Daily  . mometasone-formoterol  2 puff Inhalation BID  . pantoprazole  40 mg Oral Daily  . predniSONE  60 mg Oral BID WC  . senna-docusate  1 tablet Oral BID   Continuous Infusions:   Active Problems:   Chronic diastolic (congestive) heart failure (HCC)   Atrial fibrillation with RVR (HCC)   Leukocytosis   HCAP (healthcare-associated pneumonia)   Type II diabetes mellitus (HCC)   Palliative care by specialist   Goals of care, counseling/discussion   DNR (do not resuscitate)  discussion    Time spent: 25 minutes.     Nolita Kutter MD PhD  [facility] Pager [phone]. If 7PM-7AM, please contact night-coverage at [email or web address], password [email or web address] 12/24/2015, 1:03 PM  LOS: 4 days

## 2015-12-25 LAB — MAGNESIUM: MAGNESIUM: 2.2 mg/dL (ref 1.7–2.4)

## 2015-12-25 LAB — GLUCOSE, CAPILLARY
GLUCOSE-CAPILLARY: 257 mg/dL — AB (ref 65–99)
GLUCOSE-CAPILLARY: 158 mg/dL — AB (ref 65–99)
GLUCOSE-CAPILLARY: 246 mg/dL — AB (ref 65–99)
Glucose-Capillary: 189 mg/dL — ABNORMAL HIGH (ref 65–99)
Glucose-Capillary: 288 mg/dL — ABNORMAL HIGH (ref 65–99)

## 2015-12-25 LAB — BASIC METABOLIC PANEL
ANION GAP: 12 (ref 5–15)
BUN: 35 mg/dL — ABNORMAL HIGH (ref 6–20)
CALCIUM: 8.6 mg/dL — AB (ref 8.9–10.3)
CO2: 37 mmol/L — AB (ref 22–32)
Chloride: 90 mmol/L — ABNORMAL LOW (ref 101–111)
Creatinine, Ser: 1.02 mg/dL — ABNORMAL HIGH (ref 0.44–1.00)
GFR calc non Af Amer: 53 mL/min — ABNORMAL LOW (ref >60)
Glucose, Bld: 216 mg/dL — ABNORMAL HIGH (ref 65–99)
Potassium: 4.2 mmol/L (ref 3.5–5.1)
SODIUM: 139 mmol/L (ref 135–145)

## 2015-12-25 LAB — CULTURE, BLOOD (ROUTINE X 2)
CULTURE: NO GROWTH
Culture: NO GROWTH

## 2015-12-25 LAB — CBC
HEMATOCRIT: 32.7 % — AB (ref 36.0–46.0)
HEMOGLOBIN: 9.6 g/dL — AB (ref 12.0–15.0)
MCH: 25.6 pg — ABNORMAL LOW (ref 26.0–34.0)
MCHC: 29.4 g/dL — ABNORMAL LOW (ref 30.0–36.0)
MCV: 87.2 fL (ref 78.0–100.0)
Platelets: 326 10*3/uL (ref 150–400)
RBC: 3.75 MIL/uL — ABNORMAL LOW (ref 3.87–5.11)
RDW: 17.4 % — AB (ref 11.5–15.5)
WBC: 17.3 10*3/uL — AB (ref 4.0–10.5)

## 2015-12-25 MED ORDER — FUROSEMIDE 40 MG PO TABS
40.0000 mg | ORAL_TABLET | Freq: Once | ORAL | Status: AC
  Administered 2015-12-25: 40 mg via ORAL
  Filled 2015-12-25: qty 1

## 2015-12-25 MED ORDER — DILTIAZEM HCL ER COATED BEADS 180 MG PO CP24
480.0000 mg | ORAL_CAPSULE | Freq: Every day | ORAL | Status: DC
  Administered 2015-12-26 – 2015-12-27 (×2): 480 mg via ORAL
  Filled 2015-12-25: qty 1
  Filled 2015-12-25: qty 2

## 2015-12-25 MED ORDER — DILTIAZEM HCL 30 MG PO TABS
30.0000 mg | ORAL_TABLET | Freq: Once | ORAL | Status: AC
  Administered 2015-12-25: 30 mg via ORAL
  Filled 2015-12-25: qty 1

## 2015-12-25 NOTE — Progress Notes (Signed)
This RN called to pt's room, upon entering the room patient was lying in bed, shaking uncontrollably. The patient was able to converse and was conscious the whole time. Vital signs were taken and were stable. Shaking episode subsided and the patient stated that "this happens a lot". She said it has been going on for "about a year", but she has not informed her doctor about it. MD on call was notified and no new orders were placed at this time. Informed the patient to notify this RN if another episode occurs. Will continue to monitor the patient closely.   Feliciana RossettiLaura Kaytlynne Neace, RN, BSN

## 2015-12-25 NOTE — Progress Notes (Signed)
Paged Dr. Roda ShuttersXu, heart rate up to 130's to 150's while eating.

## 2015-12-25 NOTE — Progress Notes (Addendum)
[facility] PROGRESS NOTE  Brenda Wells:[id number] DOB: Feb 08, 1941 DOA: 12/20/2015 PCP: [name], MD  Assessment/Plan:  Acute on chronic hypercapnic, hypoxic respiratory failure Multifactorial, COPD exacerbation , in setting current smoker, PNA, required bipap initially on admission Better on 4/7  HCAP. -IV vancomycin and cefepime.  Lost iv access on 4/6, seems a bit fluids overload, changed to oral abx doxycycline on 4/6  Severe COPD, on home 02. -scheduled Duonebs, prn albuterol, change to xopenex/atrovent due to tachycardia -IV steroids. Change to oral prednisone  Leukocytosis, likely combiination of PNA and steroids  Atrial fibrillation with RVR. ChadsVasc 6 -on cardizem. Does increased due to intermittent afib/rvr -continue home Eliquis  Chronic diastolic heart failure.  -pedal edema on exam. Normal BNP -received one time dose IV lasix in the ed.   resume home  PO today on 4/6 , additional dose of iv lasix on 4/7 due to persistent bilateral ankle/pedal edema  Type II diabetes.  -hold home Glucotrol -CBG, SSI  Tobacco abuse.  -RN to provide smoking cessation   Code Status: Partial Code. Patient was DNR, has been on home hospice, she now states she is partial code, no intubation, yes to cpr and defibrillation, palliative care consulted for further clarifying goal of care which patient agreed to. Family Communication: patient, no family at time of my evaluation  Disposition Plan: improving, transfer to tele, final disposition pending palliative care team input   Consultants:  Palliative care  Procedures:  none  Antibiotics:  Cefepime from admission to 4/6  Vancomycin from admission to 4/6  Doxycycline from 4/6-  HPI/Subjective: Feel better this am, minimal wheezing, less ankle/pedal edema, intermittent tachycardia Patient reported her son and daughter in law who she live with got the flu, she does not want to be discharged from the  hospital today  Objective: Filed Vitals:   12/25/15 0557 12/25/15 0908  BP: 131/68 132/76  Pulse:  100  Temp: 97.6 F (36.4 C) 97.6 F (36.4 C)  Resp:  18    Intake/Output Summary (Last 24 hours) at 12/25/15 1323 Last data filed at 12/25/15 1234  Gross per 24 hour  Intake   1200 ml  Output      0 ml  Net   1200 ml   Filed Weights   12/23/15 0400 12/24/15 0356 12/24/15 1934  Weight: 67.2 kg (148 lb 2.4 oz) 67.3 kg (148 lb 5.9 oz) 67.314 kg (148 lb 6.4 oz)    Exam:   General:  NAD  Cardiovascular: S 1, S 2 IRRR  Respiratory: B/L crackles, B/L wheezing has largely resolved, now overall very diminished  Abdomen: BS present, soft, nt  Musculoskeletal: less Pitting bilateral ankle/pedal edema   Data Reviewed: Basic Metabolic Panel:  Recent Labs Lab 12/20/15 0447 12/21/15 0325 12/23/15 0444 12/25/15 0610  NA 143 139 140 139  K 3.4* 3.8 4.5 4.2  CL 97* 93* 96* 90*  CO2 34* 34* 30 37*  GLUCOSE 124* 189* 223* 216*  BUN 19 21* 35* 35*  CREATININE 0.86 1.14* 0.99 1.02*  CALCIUM 8.6* 8.8* 8.8* 8.6*  MG  --   --   --  2.2   Liver Function Tests:  Recent Labs Lab 12/20/15 0447  AST 20  ALT 23  ALKPHOS 61  BILITOT 0.2*  PROT 6.3*  ALBUMIN 3.4*   No results for input(s): LIPASE, AMYLASE in the last 168 hours. No results for input(s): AMMONIA in the last 168 hours. CBC:  Recent Labs Lab 12/20/15 0447 12/21/15 0325  12/23/15 0444 12/25/15 0610  WBC 16.4* 17.6* 17.2* 17.3*  NEUTROABS 10.2*  --   --   --   HGB 11.4* 9.2* 9.3* 9.6*  HCT 37.6 31.1* 31.5* 32.7*  MCV 90.0 87.9 88.0 87.2  PLT 349 304 331 326   Cardiac Enzymes:  Recent Labs Lab 12/20/15 0447  TROPONINI 0.03   BNP (last 3 results)  Recent Labs  11/19/15 0058 11/21/15 0402 12/20/15 0448  BNP 90.0 284.2* 68.6    ProBNP (last 3 results) No results for input(s): PROBNP in the last 8760 hours.  CBG:  Recent Labs Lab 12/24/15 1949 12/24/15 2336 12/25/15 0406 12/25/15 0756  12/25/15 1153  GLUCAP 169* 257* 257* 158* 189*    Recent Results (from the past 240 hour(s))  Culture, blood (Routine X 2) w Reflex to ID Panel     Status: None   Collection Time: 12/20/15  5:12 AM  Result Value Ref Range Status   Specimen Description BLOOD RIGHT ANTECUBITAL  Final   Special Requests   Final    BOTTLES DRAWN AEROBIC AND ANAEROBIC 5CC RED 10CC BLUE   Culture NO GROWTH 5 DAYS  Final   Report Status 12/25/2015 FINAL  Final  Culture, blood (Routine X 2) w Reflex to ID Panel     Status: None   Collection Time: 12/20/15  5:15 AM  Result Value Ref Range Status   Specimen Description BLOOD LEFT HAND  Final   Special Requests BOTTLES DRAWN AEROBIC AND ANAEROBIC 5CC   Final   Culture NO GROWTH 5 DAYS  Final   Report Status 12/25/2015 FINAL  Final  MRSA PCR Screening     Status: None   Collection Time: 12/20/15  5:41 PM  Result Value Ref Range Status   MRSA by PCR NEGATIVE NEGATIVE Final    Comment:        The GeneXpert MRSA Assay (FDA approved for NASAL specimens only), is one component of a comprehensive MRSA colonization surveillance program. It is not intended to diagnose MRSA infection nor to guide or monitor treatment for MRSA infections.      Studies: No results found.  Scheduled Meds: . apixaban  5 mg Oral BID  . [START ON 12/26/2015] diltiazem  480 mg Oral Daily  . doxycycline  100 mg Oral Q12H  . famotidine  20 mg Oral QHS  . feeding supplement (GLUCERNA SHAKE)  237 mL Oral TID BM  . furosemide  40 mg Oral Daily  . guaiFENesin  600 mg Oral BID  . insulin aspart  0-15 Units Subcutaneous TID WC  . ipratropium  0.5 mg Nebulization Q6H  . levalbuterol  1.25 mg Nebulization Q6H  . loratadine  10 mg Oral Daily  . mometasone-formoterol  2 puff Inhalation BID  . predniSONE  60 mg Oral BID WC   Continuous Infusions:   Active Problems:   Chronic diastolic (congestive) heart failure (HCC)   Atrial fibrillation with RVR (HCC)   Leukocytosis   HCAP  (healthcare-associated pneumonia)   Type II diabetes mellitus (HCC)   Palliative care by specialist   Goals of care, counseling/discussion   DNR (do not resuscitate) discussion    Time spent: 25 minutes.     Zawadi Aplin MD PhD  [facility] Pager [phone]. If 7PM-7AM, please contact night-coverage at [email or web address], password [email or web address] 12/25/2015, 1:23 PM  LOS: 5 days

## 2015-12-26 LAB — T4, FREE: FREE T4: 0.99 ng/dL (ref 0.61–1.12)

## 2015-12-26 LAB — GLUCOSE, CAPILLARY
GLUCOSE-CAPILLARY: 187 mg/dL — AB (ref 65–99)
Glucose-Capillary: 204 mg/dL — ABNORMAL HIGH (ref 65–99)
Glucose-Capillary: 293 mg/dL — ABNORMAL HIGH (ref 65–99)
Glucose-Capillary: 191 mg/dL — ABNORMAL HIGH (ref 65–99)

## 2015-12-26 LAB — TSH: TSH: 0.059 u[IU]/mL — ABNORMAL LOW (ref 0.350–4.500)

## 2015-12-26 LAB — BASIC METABOLIC PANEL
ANION GAP: 14 (ref 5–15)
BUN: 33 mg/dL — AB (ref 6–20)
CO2: 37 mmol/L — ABNORMAL HIGH (ref 22–32)
Calcium: 8.6 mg/dL — ABNORMAL LOW (ref 8.9–10.3)
Chloride: 88 mmol/L — ABNORMAL LOW (ref 101–111)
Creatinine, Ser: 1.08 mg/dL — ABNORMAL HIGH (ref 0.44–1.00)
GFR calc Af Amer: 57 mL/min — ABNORMAL LOW (ref >60)
GFR, EST NON AFRICAN AMERICAN: 49 mL/min — AB (ref >60)
Glucose, Bld: 246 mg/dL — ABNORMAL HIGH (ref 65–99)
POTASSIUM: 3.8 mmol/L (ref 3.5–5.1)
SODIUM: 139 mmol/L (ref 135–145)

## 2015-12-26 LAB — MAGNESIUM: MAGNESIUM: 1.9 mg/dL (ref 1.7–2.4)

## 2015-12-26 MED ORDER — GABAPENTIN 100 MG PO CAPS
100.0000 mg | ORAL_CAPSULE | Freq: Every day | ORAL | Status: DC
  Administered 2015-12-26: 100 mg via ORAL
  Filled 2015-12-26: qty 1

## 2015-12-26 MED ORDER — PREDNISONE 20 MG PO TABS
40.0000 mg | ORAL_TABLET | Freq: Two times a day (BID) | ORAL | Status: DC
  Administered 2015-12-26 – 2015-12-27 (×2): 40 mg via ORAL
  Filled 2015-12-26 (×2): qty 2

## 2015-12-26 MED ORDER — FUROSEMIDE 40 MG PO TABS
40.0000 mg | ORAL_TABLET | Freq: Once | ORAL | Status: AC
  Administered 2015-12-26: 40 mg via ORAL
  Filled 2015-12-26: qty 1

## 2015-12-26 MED ORDER — FAMOTIDINE 20 MG PO TABS: 20.0000 mg | ORAL_TABLET | Freq: Every day | ORAL | Status: AC

## 2015-12-26 MED ORDER — IPRATROPIUM-ALBUTEROL 0.5-2.5 (3) MG/3ML IN SOLN
3.0000 mL | Freq: Four times a day (QID) | RESPIRATORY_TRACT | Status: DC
  Administered 2015-12-26 – 2015-12-27 (×4): 3 mL via RESPIRATORY_TRACT
  Filled 2015-12-26 (×2): qty 3

## 2015-12-26 NOTE — Progress Notes (Signed)
[facility] PROGRESS NOTE  Brenda Wells ZOX:[id number] DOB: 1941-04-23 DOA: 12/20/2015 PCP: [name], MD  Assessment/Plan:  Shakiness: no loc, reported has been intermittent for the last year, trial of neurontin qhs, taper steroids  Acute on chronic hypercapnic, hypoxic respiratory failure Multifactorial, COPD exacerbation , in setting current smoker, PNA, required bipap initially on admission Better, diffuse bilateral wheezing has resolved, now lunf exam very diminished , likely baseline  HCAP. -IV vancomycin and cefepime.  Lost iv access on 4/6, seems a bit fluids overload, changed to oral abx doxycycline on 4/6  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 Change to oral prednisone and taper   Leukocytosis, likely combiination of PNA and steroids  Atrial fibrillation with RVR. ChadsVasc 6 -on cardizem. Does increased due to intermittent afib/rvr -continue home Eliquis  Chronic diastolic heart failure.  -pedal edema on exam. Normal BNP -received iv lasix on 4/7, now on lasix bid,  -bilateral ankle/pedal edema has much improved, anticipate discharge home on lasix daily with bid prn for edema  Type II diabetes.  -hold home Glucotrol -CBG, SSI  Tobacco abuse.  -RN to provide smoking cessation   Code Status: code status discussed again with palliative care during this admission, partial code as ordered ( yes to call code blue, yes to call repaid response, yes to bipap, no intubation ,no cpr, no defibrillation, no acls drugs) Family Communication: patient, no family at time of my evaluation  Disposition Plan: home to home hospice on 4/10   Consultants:  Palliative care  Procedures:  none  Antibiotics:  Cefepime from admission to 4/6  Vancomycin from admission to 4/6  Doxycycline from  4/6-  HPI/Subjective: Overnight shakiness even noted, now better   Wheezing has resolved, less cough, less ankle/pedal edema, heart rate better after increased  cardizem dose   Objective: Filed Vitals:   12/26/15 0443 12/26/15 0750  BP: 103/58 135/80  Pulse: 101 106  Temp: 97.8 F (36.6 C) 97.9 F (36.6 C)  Resp: 20 20    Intake/Output Summary (Last 24 hours) at 12/26/15 1139 Last data filed at 12/26/15 1011  Gross per 24 hour  Intake   3000 ml  Output   2600 ml  Net    400 ml   Filed Weights   12/24/15 0356 12/24/15 1934 12/26/15 0443  Weight: 67.3 kg (148 lb 5.9 oz) 67.314 kg (148 lb 6.4 oz) 64 kg (141 lb 1.5 oz)    Exam:   General:  NAD, frail  Cardiovascular: S 1, S 2 IRRR  Respiratory: B/L crackles, B/L wheezing has largely resolved, now overall very diminished  Abdomen: BS present, soft, nt  Musculoskeletal:  bilateral ankle edema has resolved,pedal edema improving as well  Data Reviewed: Basic Metabolic Panel:  Recent Labs Lab 12/20/15 0447 12/21/15 0325 12/23/15 0444 12/25/15 0610 12/26/15 0431  NA 143 139 140 139 139  K 3.4* 3.8 4.5 4.2 3.8  CL 97* 93* 96* 90* 88*  CO2 34* 34* 30 37* 37*  GLUCOSE 124* 189* 223* 216* 246*  BUN 19 21* 35* 35* 33*  CREATININE 0.86 1.14* 0.99 1.02* 1.08*  CALCIUM 8.6* 8.8* 8.8* 8.6* 8.6*  MG  --   --   --  2.2 1.9   Liver Function Tests:  Recent Labs Lab 12/20/15 0447  AST 20  ALT 23  ALKPHOS 61  BILITOT 0.2*  PROT 6.3*  ALBUMIN 3.4*  No results for input(s): LIPASE, AMYLASE in the last 168 hours. No results for input(s): AMMONIA in the last 168 hours. CBC:  Recent Labs Lab 12/20/15 0447 12/21/15 0325 12/23/15 0444 12/25/15 0610  WBC 16.4* 17.6* 17.2* 17.3*  NEUTROABS 10.2*  --   --   --   HGB 11.4* 9.2* 9.3* 9.6*  HCT 37.6 31.1* 31.5* 32.7*  MCV 90.0 87.9 88.0 87.2  PLT 349 304 331 326   Cardiac Enzymes:  Recent Labs Lab 12/20/15 0447  TROPONINI 0.03   BNP (last 3  results)  Recent Labs  11/19/15 0058 11/21/15 0402 12/20/15 0448  BNP 90.0 284.2* 68.6    ProBNP (last 3 results) No results for input(s): PROBNP in the last 8760 hours.  CBG:  Recent Labs Lab 12/25/15 0756 12/25/15 1153 12/25/15 1701 12/25/15 2142 12/26/15 0747  GLUCAP 158* 189* 288* 246* 187*    Recent Results (from the past 240 hour(s))  Culture, blood (Routine X 2) w Reflex to ID Panel     Status: None   Collection Time: 12/20/15  5:12 AM  Result Value Ref Range Status   Specimen Description BLOOD RIGHT ANTECUBITAL  Final   Special Requests   Final    BOTTLES DRAWN AEROBIC AND ANAEROBIC 5CC RED 10CC BLUE   Culture NO GROWTH 5 DAYS  Final   Report Status 12/25/2015 FINAL  Final  Culture, blood (Routine X 2) w Reflex to ID Panel     Status: None   Collection Time: 12/20/15  5:15 AM  Result Value Ref Range Status   Specimen Description BLOOD LEFT HAND  Final   Special Requests BOTTLES DRAWN AEROBIC AND ANAEROBIC 5CC   Final   Culture NO GROWTH 5 DAYS  Final   Report Status 12/25/2015 FINAL  Final  MRSA PCR Screening     Status: None   Collection Time: 12/20/15  5:41 PM  Result Value Ref Range Status   MRSA by PCR NEGATIVE NEGATIVE Final    Comment:        The GeneXpert MRSA Assay (FDA approved for NASAL specimens only), is one component of a comprehensive MRSA colonization surveillance program. It is not intended to diagnose MRSA infection nor to guide or monitor treatment for MRSA infections.      Studies: No results found.  Scheduled Meds: . apixaban  5 mg Oral BID  . diltiazem  480 mg Oral Daily  . doxycycline  100 mg Oral Q12H  . famotidine  20 mg Oral QHS  . feeding supplement (GLUCERNA SHAKE)  237 mL Oral TID BM  . furosemide  40 mg Oral Daily  . gabapentin  100 mg Oral QHS  . guaiFENesin  600 mg Oral BID  . insulin aspart  0-15 Units Subcutaneous TID WC  . ipratropium-albuterol  3 mL Nebulization Q6H  . loratadine  10 mg Oral Daily  .  mometasone-formoterol  2 puff Inhalation BID  . predniSONE  40 mg Oral BID WC   Continuous Infusions:   Active Problems:   Chronic diastolic (congestive) heart failure (HCC)   Atrial fibrillation with RVR (HCC)   Leukocytosis   HCAP (healthcare-associated pneumonia)   Type II diabetes mellitus (HCC)   Palliative care by specialist   Goals of care, counseling/discussion   DNR (do not resuscitate) discussion    Time spent: 25 minutes.     Reigan Tolliver MD PhD  [facility] Pager [phone]. If 7PM-7AM, please contact night-coverage at [email or web address], password [facility] 12/26/2015, 11:39 AM  LOS:  6 days

## 2015-12-26 NOTE — Plan of Care (Signed)
Problem: Phase I Progression Outcomes Goal: Flu/PneumoVaccines if indicated Outcome: Completed/Met Date Met:  12/26/15 Patient assessed for vaccines. Has already received.

## 2015-12-27 LAB — MAGNESIUM: MAGNESIUM: 2.1 mg/dL (ref 1.7–2.4)

## 2015-12-27 LAB — GLUCOSE, CAPILLARY
GLUCOSE-CAPILLARY: 154 mg/dL — AB (ref 65–99)
Glucose-Capillary: 195 mg/dL — ABNORMAL HIGH (ref 65–99)

## 2015-12-27 LAB — BASIC METABOLIC PANEL
ANION GAP: 13 (ref 5–15)
BUN: 40 mg/dL — ABNORMAL HIGH (ref 6–20)
CO2: 38 mmol/L — ABNORMAL HIGH (ref 22–32)
Calcium: 8.6 mg/dL — ABNORMAL LOW (ref 8.9–10.3)
Chloride: 86 mmol/L — ABNORMAL LOW (ref 101–111)
Creatinine, Ser: 1.01 mg/dL — ABNORMAL HIGH (ref 0.44–1.00)
GFR calc Af Amer: >60 mL/min (ref >60)
GFR, EST NON AFRICAN AMERICAN: 53 mL/min — AB (ref >60)
Glucose, Bld: 201 mg/dL — ABNORMAL HIGH (ref 65–99)
POTASSIUM: 4.3 mmol/L (ref 3.5–5.1)
SODIUM: 137 mmol/L (ref 135–145)

## 2015-12-27 MED ORDER — DOXYCYCLINE HYCLATE 100 MG PO TABS: 100.0000 mg | ORAL_TABLET | Freq: Two times a day (BID) | ORAL | Status: DC

## 2015-12-27 MED ORDER — FUROSEMIDE 40 MG PO TABS: 40.0000 mg | ORAL_TABLET | Freq: Every day | ORAL | Status: AC

## 2015-12-27 MED ORDER — DILTIAZEM HCL ER COATED BEADS 240 MG PO CP24: 480.0000 mg | ORAL_CAPSULE | Freq: Every day | ORAL | Status: AC

## 2015-12-27 MED ORDER — OXYCODONE HCL 5 MG PO TABS: 5.0000 mg | ORAL_TABLET | ORAL | Status: DC | PRN

## 2015-12-27 MED ORDER — GABAPENTIN 100 MG PO CAPS: 100.0000 mg | ORAL_CAPSULE | Freq: Every day | ORAL | Status: AC

## 2015-12-27 MED ORDER — PREDNISONE 10 MG PO TABS: ORAL_TABLET | ORAL | Status: DC

## 2015-12-27 NOTE — Care Management Note (Addendum)
Case Management Note  Patient Details  Name: Harlow Asadna G Benzel MRN: [id number] Date of Birth: 1941-04-15  Subjective/Objective:          CM following for progression and d/c planning..          Action/Plan: 12/27/2015 Spoke with pt who confirmed that she is [facility], she states that they are aware of her hospitalization as they were notified of her illness and sent her to the hospital. This CM contacted [facility] office and notified them of plan to d/c this pt to home today. Will fax d/c summary as requested.  3:10pm Face sheet and d/[facility] summary [facility].   Expected Discharge Date:       12/27/2015           Expected Discharge Plan:  Home w Hospice Care  In-House Referral:     Discharge planning Services  CM Consult  Post Acute Care Choice:    Choice offered to:     DME Arranged:    DME Agency:     HH Arranged:  RN, Nurse's Aide, Social Work [employer] Agency:  Other - See comment Engineer, building services([facility].)  Status of Service:  Completed, signed off  Medicare Important Message Given:  Yes Date Medicare IM Given:    Medicare IM give by:    Date Additional Medicare IM Given:    Additional Medicare Important Message give by:     If discussed at Long Length of Stay Meetings, dates discussed:    Additional Comments:  Starlyn SkeansRoyal, Reinhardt Licausi U, RN 12/27/2015, 9:59 AM

## 2015-12-27 NOTE — Discharge Summary (Signed)
Discharge Summary  Brenda Wells ZOX:[id number] DOB: 1940/09/21  PCP: [name],[name], MD  Admit date: 12/20/2015 Discharge date: 12/27/2015  Time spent: <9230mins  Recommendations for Outpatient Follow-up:  1. F/u with PMD within a week  for hospital discharge follow up, repeat cbc/bmp at follow up 2. Continue home hospice  Discharge Diagnoses:  Active Hospital Problems   Diagnosis Date Noted  . Palliative care by specialist   . Goals of care, counseling/discussion   . DNR (do not resuscitate) discussion   . HCAP (healthcare-associated pneumonia) 12/20/2015  . Type II diabetes mellitus (HCC) 12/20/2015  . Leukocytosis 10/21/2015  . Atrial fibrillation with RVR (HCC) 09/29/2015  . Chronic diastolic (congestive) heart failure (HCC) 09/02/2015    Resolved Hospital Problems   Diagnosis Date Noted Date Resolved  No resolved problems to display.    Discharge Condition: stable  Diet recommendation: heart healthy/carb modified  Filed Weights   12/24/15 1934 12/26/15 0443 12/26/15 2042  Weight: 67.314 kg (148 lb 6.4 oz) 64 kg (141 lb 1.5 oz) 65.318 kg (144 lb)    History of present illness:  This is a [age] year old female with history of chronic respiratory failure on 3 L of oxygen, severe COPD, atrial fibrillation on anticoagulation, diastolic congestive heart failure, presents to the hospital with complaints of shortness of breath. Chest x-ray indicated left-sided healthcare associated pneumonia as well as small pleural effusion. She does have some evidence of peripheral edema, although BNP is in normal range. She was initially started on BiPAP in the emergency room and appears to clinically improve. She was also noted to be in rapid atrial fibrillation. She's been started on intravenous antibiotics to cover healthcare associated pneumonia. Due to her severe COPD, we'll continue her on bronchodilators as well as intravenous steroids. For rapid atrial fibrillation, will restart her home  dose of diltiazem. If this does not control her rate, she will likely a Cardizem infusion. She'll receive a dose of intravenous Lasix today and will be continued on her home dose tomorrow. If shortness of breath persists, consider continuing on intravenous Lasix tomorrow. We'll try to wean her off BiPAP. Patient will be admitted to the stepdown unit in case she needs to go back on BiPAP or needs Cardizem infusion. Anticipate that she will be in the hospital for 2-3 days.  Hospital Course:  Active Problems:   Chronic diastolic (congestive) heart failure (HCC)   Atrial fibrillation with RVR (HCC)   Leukocytosis   HCAP (healthcare-associated pneumonia)   Type II diabetes mellitus (HCC)   Palliative care by specialist   Goals of care, counseling/discussion   DNR (do not resuscitate) discussion  Shakiness: no loc, reported has been intermittent for the last year, trial of neurontin qhs, taper steroids back to baseline  Acute on chronic hypercapnic, hypoxic respiratory failure Multifactorial, COPD exacerbation , in setting current smoker, PNA, required bipap initially on admission Better, diffuse bilateral wheezing has resolved, now lunf exam very diminished , likely baseline  HCAP. -IV vancomycin and cefepime.  Lost iv access on 4/6, seems a bit fluids overload, changed to oral abx doxycycline on 4/6, discharged with two more days to finish total of 10day treatment.  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 Change to oral prednisone and taper to baseline  Leukocytosis, likely combiination of PNA and steroids  Atrial fibrillation with RVR. ChadsVasc 6 -on cardizem. Does increased from 360mg  po qd to  480mg  po qd due to intermittent afib/rvr -continue home Eliquis  Chronic diastolic heart failure.  -pedal edema on exam. Normal BNP -received iv lasix  on 4/7, now on lasix bid,  -bilateral ankle/pedal edema has much improved, anticipate discharge home on lasix daily with bid prn for edema  Type II diabetes.  -home Glucotrol held in the hospital, resumed at discharge   Tobacco abuse.  -RN to provide smoking cessation   Code Status: code status discussed again with palliative care during this admission, partial code as ordered ( yes to call code blue, yes to call repaid response, yes to bipap, no intubation ,no cpr, no defibrillation, no acls drugs) Family Communication: patient, no family at time of my evaluation  Disposition Plan: home to home hospice on 4/10   Consultants:  Palliative care  Procedures:  none  Antibiotics:  Cefepime from admission to 4/6  Vancomycin from admission to 4/6  Doxycycline from 4/6-   Discharge Exam: BP 119/58 mmHg  Pulse 95  Temp(Src) 98.2 F (36.8 C) (Oral)  Resp 22  Ht 5\' 5"  (1.651 m)  Wt 65.318 kg (144 lb)  BMI 23.96 kg/m2  SpO2 95%   General: NAD, frail  Cardiovascular: S 1, S 2 IRRR  Respiratory: B/L crackles, B/L wheezing has largely resolved, now overall very diminished  Abdomen: BS present, soft, nt  Musculoskeletal: bilateral ankle and pedal edema has largely resolved   Discharge Instructions You were cared for by a hospitalist during your hospital stay. If you have any questions about your discharge medications or the care you received while you were in the hospital after you are discharged, you can call the unit and asked to speak with the hospitalist on call if the hospitalist that took care of you is not available. Once you are discharged, your primary care physician will handle any further medical issues. Please note that NO REFILLS for any discharge medications will be authorized once you are discharged, as it is imperative that you return to your primary care physician (or establish a relationship with a primary care physician if you do not have one) for your  aftercare needs so that they can reassess your need for medications and monitor your lab values.      Discharge Instructions    Diet - low sodium heart healthy    Complete by:  As directed   Carb modified     Increase activity slowly    Complete by:  As directed             Medication List    TAKE these medications        albuterol (2.5 MG/3ML) 0.083% nebulizer solution  Commonly known as:  PROVENTIL  Take 3 mLs (2.5 mg total) by nebulization every 2 (two) hours as needed for wheezing or shortness of breath.     apixaban 5 MG Tabs tablet  Commonly known as:  ELIQUIS  Take 1 tablet (5 mg total) by mouth 2 (two) times daily.     arformoterol 15 MCG/2ML Nebu  Commonly known as:  BROVANA  Take 2 mLs (15 mcg total) by nebulization 2 (two) times daily.     diltiazem 240 MG 24 hr capsule  Commonly known as:  CARDIZEM CD  Take 2 capsules (480 mg total) by mouth daily.     doxycycline 100 MG tablet  Commonly known as:  VIBRA-TABS  Take 1 tablet (100 mg total) by mouth every 12 (twelve) hours.     famotidine 20  MG tablet  Commonly known as:  PEPCID  Take 1 tablet (20 mg total) by mouth at bedtime.     feeding supplement (GLUCERNA SHAKE) Liqd  Take 237 mLs by mouth 3 (three) times daily between meals.     Fluticasone-Salmeterol 500-50 MCG/DOSE Aepb  Commonly known as:  ADVAIR  Inhale 1 puff into the lungs 2 (two) times daily.     furosemide 40 MG tablet  Commonly known as:  LASIX  Take 1 tablet (40 mg total) by mouth daily. May take additional one tab in pm if increased swelling     gabapentin 100 MG capsule  Commonly known as:  NEURONTIN  Take 1 capsule (100 mg total) by mouth at bedtime.     glipiZIDE 5 MG tablet  Commonly known as:  GLUCOTROL  Take 1 tablet (5 mg total) by mouth daily before breakfast.     guaiFENesin 600 MG 12 hr tablet  Commonly known as:  MUCINEX  Take 1 tablet (600 mg total) by mouth 2 (two) times daily.     ipratropium-albuterol 0.5-2.5  (3) MG/3ML Soln  Commonly known as:  DUONEB  Take 3 mLs by nebulization 3 (three) times daily.     loratadine 10 MG tablet  Commonly known as:  CLARITIN  Take 10 mg by mouth daily.     omeprazole 20 MG capsule  Commonly known as:  PRILOSEC  Take 20 mg by mouth daily.     ondansetron 4 MG tablet  Commonly known as:  ZOFRAN  Take 4 mg by mouth every 6 (six) hours as needed for nausea or vomiting.     oxyCODONE 5 MG immediate release tablet  Commonly known as:  Oxy IR/ROXICODONE  Take 1 tablet (5 mg total) by mouth every 4 (four) hours as needed for moderate pain (dyspnea, SOB).     predniSONE 10 MG tablet  Commonly known as:  DELTASONE  Take  bid for three days, then  po daily for three days, then back to baseline  daily and continue.     sennosides-docusate sodium 8.6-50 MG tablet  Commonly known as:  SENOKOT-S  Take 2 tablets by mouth 2 (two) times daily.     SPIRIVA HANDIHALER 18 MCG inhalation capsule  Generic drug:  tiotropium  Place 18 mcg into inhaler and inhale daily.     zolpidem 5 MG tablet  Commonly known as:  AMBIEN  Take 1 tablet (5 mg total) by mouth at bedtime as needed for sleep.       No Known Allergies Follow-up Information    Follow up with [name],[name], MD In 1 week.   Specialty:  Family Medicine   Why:  hospital discharge follow up       The results of significant diagnostics from this hospitalization (including imaging, microbiology, ancillary and laboratory) are listed below for reference.    Significant Diagnostic Studies: Dg Chest Port 1 View  12/20/2015  CLINICAL DATA:  Shortness of breath beginning last night. History of COPD and pneumonia, CHF. EXAM: PORTABLE CHEST 1 VIEW COMPARISON:  Chest radiograph November 23, 2015 FINDINGS: The cardiac silhouette is moderately enlarged unchanged. Mildly calcified aortic knob. Similar chronic interstitial changes increased lung volumes compatible with COPD. New small LEFT pleural effusion and  LEFT midlung zone airspace opacity. No pneumothorax. Osteopenia. Soft tissues are normal. IMPRESSION: New small LEFT pleural effusion with LEFT midlung zone airspace opacity. Followup PA and lateral chest X-ray is recommended in 3-4 weeks following trial of antibiotic therapy to  ensure resolution and exclude underlying malignancy. Stable cardiomegaly and COPD. Electronically Signed   By: Awilda Metro M.D.   On: 12/20/2015 04:42    Microbiology: Recent Results (from the past 240 hour(s))  Culture, blood (Routine X 2) w Reflex to ID Panel     Status: None   Collection Time: 12/20/15  5:12 AM  Result Value Ref Range Status   Specimen Description BLOOD RIGHT ANTECUBITAL  Final   Special Requests   Final    BOTTLES DRAWN AEROBIC AND ANAEROBIC 5CC RED 10CC BLUE   Culture NO GROWTH 5 DAYS  Final   Report Status 12/25/2015 FINAL  Final  Culture, blood (Routine X 2) w Reflex to ID Panel     Status: None   Collection Time: 12/20/15  5:15 AM  Result Value Ref Range Status   Specimen Description BLOOD LEFT HAND  Final   Special Requests BOTTLES DRAWN AEROBIC AND ANAEROBIC 5CC   Final   Culture NO GROWTH 5 DAYS  Final   Report Status 12/25/2015 FINAL  Final  MRSA PCR Screening     Status: None   Collection Time: 12/20/15  5:41 PM  Result Value Ref Range Status   MRSA by PCR NEGATIVE NEGATIVE Final    Comment:        The GeneXpert MRSA Assay (FDA approved for NASAL specimens only), is one component of a comprehensive MRSA colonization surveillance program. It is not intended to diagnose MRSA infection nor to guide or monitor treatment for MRSA infections.      Labs: Basic Metabolic Panel:  Recent Labs Lab 12/21/15 0325 12/23/15 0444 12/25/15 0610 12/26/15 0431 12/27/15 0542  NA 139 140 139 139 137  K 3.8 4.5 4.2 3.8 4.3  CL 93* 96* 90* 88* 86*  CO2 34* 30 37* 37* 38*  GLUCOSE 189* 223* 216* 246* 201*  BUN 21* 35* 35* 33* 40*  CREATININE 1.14* 0.99 1.02* 1.08* 1.01*    CALCIUM 8.8* 8.8* 8.6* 8.6* 8.6*  MG  --   --  2.2 1.9 2.1   Liver Function Tests: No results for input(s): AST, ALT, ALKPHOS, BILITOT, PROT, ALBUMIN in the last 168 hours. No results for input(s): LIPASE, AMYLASE in the last 168 hours. No results for input(s): AMMONIA in the last 168 hours. CBC:  Recent Labs Lab 12/21/15 0325 12/23/15 0444 12/25/15 0610  WBC 17.6* 17.2* 17.3*  HGB 9.2* 9.3* 9.6*  HCT 31.1* 31.5* 32.7*  MCV 87.9 88.0 87.2  PLT 304 331 326   Cardiac Enzymes: No results for input(s): CKTOTAL, CKMB, CKMBINDEX, TROPONINI in the last 168 hours. BNP: BNP (last 3 results)  Recent Labs  11/19/15 0058 11/21/15 0402 12/20/15 0448  BNP 90.0 284.2* 68.6    ProBNP (last 3 results) No results for input(s): PROBNP in the last 8760 hours.  CBG:  Recent Labs Lab 12/26/15 0747 12/26/15 1136 12/26/15 1615 12/26/15 2040 12/27/15 0748  GLUCAP 187* 204* 293* 191* 154*       Signed:  Shalee Paolo MD, PhD  [facility] 12/27/2015, 10:03 AM

## 2015-12-27 NOTE — Progress Notes (Signed)
Brenda Wells to be D/C'd Home per MD order.  Discussed prescriptions and follow up appointments with the patient. Prescriptions given to patient, medication list explained in detail. Pt verbalized understanding.    Medication List    TAKE these medications        albuterol (2.5 MG/3ML) 0.083% nebulizer solution  Commonly known as:  PROVENTIL  Take 3 mLs (2.5 mg total) by nebulization every 2 (two) hours as needed for wheezing or shortness of breath.     apixaban 5 MG Tabs tablet  Commonly known as:  ELIQUIS  Take 1 tablet (5 mg total) by mouth 2 (two) times daily.     arformoterol 15 MCG/2ML Nebu  Commonly known as:  BROVANA  Take 2 mLs (15 mcg total) by nebulization 2 (two) times daily.     diltiazem 240 MG 24 hr capsule  Commonly known as:  CARDIZEM CD  Take 2 capsules (480 mg total) by mouth daily.     doxycycline 100 MG tablet  Commonly known as:  VIBRA-TABS  Take 1 tablet (100 mg total) by mouth every 12 (twelve) hours.     famotidine 20 MG tablet  Commonly known as:  PEPCID  Take 1 tablet (20 mg total) by mouth at bedtime.     feeding supplement (GLUCERNA SHAKE) Liqd  Take 237 mLs by mouth 3 (three) times daily between meals.     Fluticasone-Salmeterol 500-50 MCG/DOSE Aepb  Commonly known as:  ADVAIR  Inhale 1 puff into the lungs 2 (two) times daily.     furosemide 40 MG tablet  Commonly known as:  LASIX  Take 1 tablet (40 mg total) by mouth daily. May take additional one tab in pm if increased swelling     gabapentin 100 MG capsule  Commonly known as:  NEURONTIN  Take 1 capsule (100 mg total) by mouth at bedtime.     glipiZIDE 5 MG tablet  Commonly known as:  GLUCOTROL  Take 1 tablet (5 mg total) by mouth daily before breakfast.     guaiFENesin 600 MG 12 hr tablet  Commonly known as:  MUCINEX  Take 1 tablet (600 mg total) by mouth 2 (two) times daily.     ipratropium-albuterol 0.5-2.5 (3) MG/3ML Soln  Commonly known as:  DUONEB  Take 3 mLs by  nebulization 3 (three) times daily.     loratadine 10 MG tablet  Commonly known as:  CLARITIN  Take 10 mg by mouth daily.     omeprazole 20 MG capsule  Commonly known as:  PRILOSEC  Take 20 mg by mouth daily.     ondansetron 4 MG tablet  Commonly known as:  ZOFRAN  Take 4 mg by mouth every 6 (six) hours as needed for nausea or vomiting.     oxyCODONE 5 MG immediate release tablet  Commonly known as:  Oxy IR/ROXICODONE  Take 1 tablet (5 mg total) by mouth every 4 (four) hours as needed for moderate pain (dyspnea, SOB).     predniSONE 10 MG tablet  Commonly known as:  DELTASONE  Take 20mg  bid for three days, then 20mg  po daily for three days, then back to baseline 10mg  daily and continue.     sennosides-docusate sodium 8.6-50 MG tablet  Commonly known as:  SENOKOT-S  Take 2 tablets by mouth 2 (two) times daily.     SPIRIVA HANDIHALER 18 MCG inhalation capsule  Generic drug:  tiotropium  Place 18 mcg into inhaler and inhale daily.  zolpidem 5 MG tablet  Commonly known as:  AMBIEN  Take 1 tablet (5 mg total) by mouth at bedtime as needed for sleep.        Filed Vitals:   12/27/15 0457 12/27/15 0824  BP: 116/63 119/58  Pulse: 110 95  Temp: 98.2 F (36.8 C) 98.2 F (36.8 C)  Resp: 23 22    Skin clean, dry and intact without evidence of skin break down, no evidence of skin tears noted. IV catheter discontinued intact. Site without signs and symptoms of complications. Dressing and pressure applied. Pt denies pain at this time. No complaints noted.  An After Visit Summary was printed and given to the patient. Patient escorted via WC, and D/C home via private auto.  Janeann Forehand BSN, RN

## 2015-12-27 NOTE — Care Management Important Message (Signed)
Important Message  Patient Details  Name: Brenda Wells MRN: [id number] Date of Birth: Jan 20, 1941   Medicare Important Message Given:  Yes    [name], [name] 12/27/2015, 12:59 PM

## 2015-12-28 ENCOUNTER — Emergency Department (HOSPITAL_COMMUNITY): Payer: Medicare Other

## 2015-12-28 ENCOUNTER — Inpatient Hospital Stay (HOSPITAL_COMMUNITY)
Admission: EM | Admit: 2015-12-28 | Discharge: 2016-01-05 | DRG: 190 | Disposition: A | Discharge status: Home Health | Payer: Medicare Other | Attending: Internal Medicine | Admitting: Internal Medicine

## 2015-12-28 ENCOUNTER — Encounter (HOSPITAL_COMMUNITY): Payer: Self-pay | Admitting: *Deleted

## 2015-12-28 DIAGNOSIS — N179 Acute kidney failure, unspecified: Secondary | ICD-10-CM | POA: Diagnosis not present

## 2015-12-28 DIAGNOSIS — Z9049 Acquired absence of other specified parts of digestive tract: Secondary | ICD-10-CM

## 2015-12-28 DIAGNOSIS — J9690 Respiratory failure, unspecified, unspecified whether with hypoxia or hypercapnia: Secondary | ICD-10-CM

## 2015-12-28 DIAGNOSIS — R0600 Dyspnea, unspecified: Secondary | ICD-10-CM | POA: Diagnosis not present

## 2015-12-28 DIAGNOSIS — I4891 Unspecified atrial fibrillation: Secondary | ICD-10-CM

## 2015-12-28 DIAGNOSIS — J9600 Acute respiratory failure, unspecified whether with hypoxia or hypercapnia: Secondary | ICD-10-CM | POA: Diagnosis not present

## 2015-12-28 DIAGNOSIS — I110 Hypertensive heart disease with heart failure: Secondary | ICD-10-CM | POA: Diagnosis not present

## 2015-12-28 DIAGNOSIS — I5032 Chronic diastolic (congestive) heart failure: Secondary | ICD-10-CM | POA: Diagnosis present

## 2015-12-28 DIAGNOSIS — Z833 Family history of diabetes mellitus: Secondary | ICD-10-CM

## 2015-12-28 DIAGNOSIS — J440 Chronic obstructive pulmonary disease with acute lower respiratory infection: Principal | ICD-10-CM | POA: Diagnosis present

## 2015-12-28 DIAGNOSIS — J9601 Acute respiratory failure with hypoxia: Secondary | ICD-10-CM | POA: Diagnosis present

## 2015-12-28 DIAGNOSIS — Z79899 Other long term (current) drug therapy: Secondary | ICD-10-CM | POA: Diagnosis not present

## 2015-12-28 DIAGNOSIS — Z66 Do not resuscitate: Secondary | ICD-10-CM

## 2015-12-28 DIAGNOSIS — Z7189 Other specified counseling: Secondary | ICD-10-CM | POA: Diagnosis not present

## 2015-12-28 DIAGNOSIS — Y95 Nosocomial condition: Secondary | ICD-10-CM | POA: Diagnosis present

## 2015-12-28 DIAGNOSIS — Z87442 Personal history of urinary calculi: Secondary | ICD-10-CM

## 2015-12-28 DIAGNOSIS — T380X5A Adverse effect of glucocorticoids and synthetic analogues, initial encounter: Secondary | ICD-10-CM | POA: Diagnosis not present

## 2015-12-28 DIAGNOSIS — Z9981 Dependence on supplemental oxygen: Secondary | ICD-10-CM | POA: Diagnosis not present

## 2015-12-28 DIAGNOSIS — Z515 Encounter for palliative care: Secondary | ICD-10-CM | POA: Diagnosis not present

## 2015-12-28 DIAGNOSIS — I482 Chronic atrial fibrillation, unspecified: Secondary | ICD-10-CM | POA: Diagnosis present

## 2015-12-28 DIAGNOSIS — J441 Chronic obstructive pulmonary disease with (acute) exacerbation: Secondary | ICD-10-CM | POA: Diagnosis not present

## 2015-12-28 DIAGNOSIS — J189 Pneumonia, unspecified organism: Secondary | ICD-10-CM | POA: Diagnosis present

## 2015-12-28 DIAGNOSIS — Z8249 Family history of ischemic heart disease and other diseases of the circulatory system: Secondary | ICD-10-CM | POA: Diagnosis not present

## 2015-12-28 DIAGNOSIS — F17210 Nicotine dependence, cigarettes, uncomplicated: Secondary | ICD-10-CM | POA: Diagnosis not present

## 2015-12-28 DIAGNOSIS — J9621 Acute and chronic respiratory failure with hypoxia: Secondary | ICD-10-CM

## 2015-12-28 DIAGNOSIS — D72829 Elevated white blood cell count, unspecified: Secondary | ICD-10-CM | POA: Diagnosis not present

## 2015-12-28 DIAGNOSIS — E1165 Type 2 diabetes mellitus with hyperglycemia: Secondary | ICD-10-CM | POA: Diagnosis present

## 2015-12-28 DIAGNOSIS — E119 Type 2 diabetes mellitus without complications: Secondary | ICD-10-CM

## 2015-12-28 DIAGNOSIS — Z7901 Long term (current) use of anticoagulants: Secondary | ICD-10-CM

## 2015-12-28 HISTORY — DX: Unspecified atrial fibrillation: I48.91

## 2015-12-28 LAB — COMPREHENSIVE METABOLIC PANEL
ALBUMIN: 3.4 g/dL — AB (ref 3.5–5.0)
ALK PHOS: 50 U/L (ref 38–126)
ALT: 23 U/L (ref 14–54)
AST: 20 U/L (ref 15–41)
Anion gap: 11 (ref 5–15)
BILIRUBIN TOTAL: 0.2 mg/dL — AB (ref 0.3–1.2)
BUN: 38 mg/dL — AB (ref 6–20)
CALCIUM: 8.7 mg/dL — AB (ref 8.9–10.3)
CO2: 39 mmol/L — ABNORMAL HIGH (ref 22–32)
Chloride: 93 mmol/L — ABNORMAL LOW (ref 101–111)
Creatinine, Ser: 0.78 mg/dL (ref 0.44–1.00)
GFR calc Af Amer: >60 mL/min (ref >60)
GFR calc non Af Amer: >60 mL/min (ref >60)
GLUCOSE: 129 mg/dL — AB (ref 65–99)
Potassium: 4.2 mmol/L (ref 3.5–5.1)
Sodium: 143 mmol/L (ref 135–145)
TOTAL PROTEIN: 5.9 g/dL — AB (ref 6.5–8.1)

## 2015-12-28 LAB — BLOOD GAS, VENOUS
ACID-BASE EXCESS: 13.5 mmol/L — AB (ref 0.0–2.0)
Bicarbonate: 40 mEq/L — ABNORMAL HIGH (ref 20.0–24.0)
O2 CONTENT: 6 L/min
O2 SAT: 90.2 %
PATIENT TEMPERATURE: 98.6
PO2 VEN: 65.8 mmHg — AB (ref 31.0–45.0)
TCO2: 36.9 mmol/L (ref 0–100)
pCO2, Ven: 62.8 mmHg — ABNORMAL HIGH (ref 45.0–50.0)
pH, Ven: 7.42 — ABNORMAL HIGH (ref 7.250–7.300)

## 2015-12-28 LAB — LACTIC ACID, PLASMA: Lactic Acid, Venous: 1.8 mmol/L (ref 0.5–2.0)

## 2015-12-28 LAB — GLUCOSE, CAPILLARY
GLUCOSE-CAPILLARY: 258 mg/dL — AB (ref 65–99)
Glucose-Capillary: 396 mg/dL — ABNORMAL HIGH (ref 65–99)
Glucose-Capillary: 184 mg/dL — ABNORMAL HIGH (ref 65–99)

## 2015-12-28 LAB — BRAIN NATRIURETIC PEPTIDE: B Natriuretic Peptide: 102.6 pg/mL — ABNORMAL HIGH (ref 0.0–100.0)

## 2015-12-28 LAB — CBC WITH DIFFERENTIAL/PLATELET
Basophils Absolute: 0 10*3/uL (ref 0.0–0.1)
Basophils Relative: 0 %
EOS PCT: 0 %
Eosinophils Absolute: 0 10*3/uL (ref 0.0–0.7)
HEMATOCRIT: 34.3 % — AB (ref 36.0–46.0)
HEMOGLOBIN: 10.5 g/dL — AB (ref 12.0–15.0)
LYMPHS ABS: 4.2 10*3/uL — AB (ref 0.7–4.0)
Lymphocytes Relative: 13 %
MCH: 27 pg (ref 26.0–34.0)
MCHC: 30.6 g/dL (ref 30.0–36.0)
MCV: 88.2 fL (ref 78.0–100.0)
MONOS PCT: 8 %
Monocytes Absolute: 2.6 10*3/uL — ABNORMAL HIGH (ref 0.1–1.0)
Neutro Abs: 25.7 10*3/uL — ABNORMAL HIGH (ref 1.7–7.7)
Neutrophils Relative %: 79 %
Platelets: 368 10*3/uL (ref 150–400)
RBC: 3.89 MIL/uL (ref 3.87–5.11)
RDW: 17.7 % — ABNORMAL HIGH (ref 11.5–15.5)
WBC: 32.5 10*3/uL — AB (ref 4.0–10.5)

## 2015-12-28 LAB — TROPONIN I: Troponin I: 0.03 ng/mL (ref <0.031)

## 2015-12-28 MED ORDER — VANCOMYCIN HCL 500 MG IV SOLR
500.0000 mg | Freq: Two times a day (BID) | INTRAVENOUS | Status: DC
  Administered 2015-12-28 – 2015-12-31 (×6): 500 mg via INTRAVENOUS
  Filled 2015-12-28 (×7): qty 500

## 2015-12-28 MED ORDER — ALBUTEROL SULFATE (2.5 MG/3ML) 0.083% IN NEBU
2.5000 mg | INHALATION_SOLUTION | Freq: Four times a day (QID) | RESPIRATORY_TRACT | Status: DC
  Administered 2015-12-28 – 2015-12-30 (×10): 2.5 mg via RESPIRATORY_TRACT
  Filled 2015-12-28 (×10): qty 3

## 2015-12-28 MED ORDER — MOMETASONE FURO-FORMOTEROL FUM 200-5 MCG/ACT IN AERO: 2.0000 | INHALATION_SPRAY | Freq: Two times a day (BID) | RESPIRATORY_TRACT | Status: DC

## 2015-12-28 MED ORDER — ARFORMOTEROL TARTRATE 15 MCG/2ML IN NEBU
15.0000 ug | INHALATION_SOLUTION | Freq: Two times a day (BID) | RESPIRATORY_TRACT | Status: DC
  Filled 2015-12-28 (×2): qty 2

## 2015-12-28 MED ORDER — INSULIN ASPART 100 UNIT/ML ~~LOC~~ SOLN
3.0000 [IU] | Freq: Three times a day (TID) | SUBCUTANEOUS | Status: DC
  Administered 2015-12-28 – 2016-01-05 (×21): 3 [IU] via SUBCUTANEOUS

## 2015-12-28 MED ORDER — GLIPIZIDE 10 MG PO TABS
5.0000 mg | ORAL_TABLET | Freq: Every day | ORAL | Status: DC
  Administered 2015-12-29 – 2016-01-05 (×8): 5 mg via ORAL
  Filled 2015-12-28 (×8): qty 1

## 2015-12-28 MED ORDER — ALBUTEROL SULFATE (2.5 MG/3ML) 0.083% IN NEBU: 2.5000 mg | INHALATION_SOLUTION | RESPIRATORY_TRACT | Status: DC | PRN

## 2015-12-28 MED ORDER — MORPHINE SULFATE (PF) 2 MG/ML IV SOLN
1.0000 mg | Freq: Once | INTRAVENOUS | Status: AC
  Administered 2015-12-28: 1 mg via INTRAVENOUS
  Filled 2015-12-28: qty 1

## 2015-12-28 MED ORDER — PIPERACILLIN-TAZOBACTAM 3.375 G IVPB
3.3750 g | Freq: Once | INTRAVENOUS | Status: AC
  Administered 2015-12-28: 3.375 g via INTRAVENOUS
  Filled 2015-12-28: qty 50

## 2015-12-28 MED ORDER — ALBUTEROL SULFATE (2.5 MG/3ML) 0.083% IN NEBU
5.0000 mg | INHALATION_SOLUTION | Freq: Once | RESPIRATORY_TRACT | Status: AC
  Administered 2015-12-28: 5 mg via RESPIRATORY_TRACT
  Filled 2015-12-28: qty 6

## 2015-12-28 MED ORDER — GUAIFENESIN ER 600 MG PO TB12
600.0000 mg | ORAL_TABLET | Freq: Two times a day (BID) | ORAL | Status: DC
  Administered 2015-12-28 – 2016-01-05 (×16): 600 mg via ORAL
  Filled 2015-12-28 (×16): qty 1

## 2015-12-28 MED ORDER — FAMOTIDINE 20 MG PO TABS
20.0000 mg | ORAL_TABLET | Freq: Every day | ORAL | Status: DC
  Administered 2015-12-28 – 2016-01-04 (×8): 20 mg via ORAL
  Filled 2015-12-28 (×8): qty 1

## 2015-12-28 MED ORDER — METHYLPREDNISOLONE SODIUM SUCC 125 MG IJ SOLR
125.0000 mg | Freq: Once | INTRAMUSCULAR | Status: AC
  Administered 2015-12-28: 125 mg via INTRAVENOUS
  Filled 2015-12-28: qty 2

## 2015-12-28 MED ORDER — FUROSEMIDE 40 MG PO TABS
40.0000 mg | ORAL_TABLET | Freq: Every day | ORAL | Status: DC
Start: 2015-12-29 — End: 2016-01-02
  Administered 2015-12-29 – 2016-01-02 (×5): 40 mg via ORAL
  Filled 2015-12-28 (×5): qty 1

## 2015-12-28 MED ORDER — ONDANSETRON HCL 4 MG/2ML IJ SOLN: 4.0000 mg | Freq: Four times a day (QID) | INTRAMUSCULAR | Status: DC | PRN

## 2015-12-28 MED ORDER — ONDANSETRON HCL 4 MG PO TABS: 4.0000 mg | ORAL_TABLET | Freq: Four times a day (QID) | ORAL | Status: DC | PRN

## 2015-12-28 MED ORDER — IPRATROPIUM BROMIDE 0.02 % IN SOLN
0.5000 mg | Freq: Once | RESPIRATORY_TRACT | Status: AC
  Administered 2015-12-28: 0.5 mg via RESPIRATORY_TRACT
  Filled 2015-12-28: qty 2.5

## 2015-12-28 MED ORDER — ZOLPIDEM TARTRATE 5 MG PO TABS: 5.0000 mg | ORAL_TABLET | Freq: Every evening | ORAL | Status: DC | PRN

## 2015-12-28 MED ORDER — VANCOMYCIN HCL IN DEXTROSE 1-5 GM/200ML-% IV SOLN
1000.0000 mg | Freq: Once | INTRAVENOUS | Status: DC
  Administered 2015-12-28: 1000 mg via INTRAVENOUS
  Filled 2015-12-28: qty 200

## 2015-12-28 MED ORDER — GABAPENTIN 100 MG PO CAPS
100.0000 mg | ORAL_CAPSULE | Freq: Every day | ORAL | Status: DC
  Administered 2015-12-28 – 2016-01-04 (×8): 100 mg via ORAL
  Filled 2015-12-28 (×8): qty 1

## 2015-12-28 MED ORDER — ACETAMINOPHEN 325 MG PO TABS: 650.0000 mg | ORAL_TABLET | Freq: Four times a day (QID) | ORAL | Status: DC | PRN

## 2015-12-28 MED ORDER — PANTOPRAZOLE SODIUM 40 MG PO TBEC
40.0000 mg | DELAYED_RELEASE_TABLET | Freq: Every day | ORAL | Status: DC
  Administered 2015-12-28 – 2016-01-05 (×9): 40 mg via ORAL
  Filled 2015-12-28 (×9): qty 1

## 2015-12-28 MED ORDER — DILTIAZEM HCL ER COATED BEADS 120 MG PO CP24
480.0000 mg | ORAL_CAPSULE | Freq: Every day | ORAL | Status: DC
  Administered 2015-12-28 – 2016-01-05 (×9): 480 mg via ORAL
  Filled 2015-12-28 (×10): qty 1

## 2015-12-28 MED ORDER — SODIUM CHLORIDE 0.9 % IV SOLN
INTRAVENOUS | Status: DC
  Administered 2015-12-28: 1000 mL via INTRAVENOUS
  Administered 2015-12-28: 15:00:00 via INTRAVENOUS

## 2015-12-28 MED ORDER — ALBUTEROL SULFATE (2.5 MG/3ML) 0.083% IN NEBU: 5.0000 mg | INHALATION_SOLUTION | Freq: Once | RESPIRATORY_TRACT | Status: DC

## 2015-12-28 MED ORDER — ONDANSETRON HCL 4 MG PO TABS
4.0000 mg | ORAL_TABLET | Freq: Four times a day (QID) | ORAL | Status: DC | PRN
Start: 2015-12-28 — End: 2016-01-05

## 2015-12-28 MED ORDER — SENNOSIDES-DOCUSATE SODIUM 8.6-50 MG PO TABS
2.0000 | ORAL_TABLET | Freq: Two times a day (BID) | ORAL | Status: DC
  Administered 2015-12-28 – 2015-12-29 (×3): 2 via ORAL
  Filled 2015-12-28 (×7): qty 2

## 2015-12-28 MED ORDER — POLYETHYLENE GLYCOL 3350 17 G PO PACK: 17.0000 g | PACK | Freq: Every day | ORAL | Status: DC | PRN

## 2015-12-28 MED ORDER — ALBUTEROL SULFATE (2.5 MG/3ML) 0.083% IN NEBU
2.5000 mg | INHALATION_SOLUTION | RESPIRATORY_TRACT | Status: DC | PRN
  Administered 2016-01-05: 2.5 mg via RESPIRATORY_TRACT
  Filled 2015-12-28: qty 3

## 2015-12-28 MED ORDER — INSULIN DETEMIR 100 UNIT/ML ~~LOC~~ SOLN
5.0000 [IU] | Freq: Every day | SUBCUTANEOUS | Status: DC
  Administered 2015-12-28 – 2016-01-01 (×5): 5 [IU] via SUBCUTANEOUS
  Filled 2015-12-28 (×6): qty 0.05

## 2015-12-28 MED ORDER — ARFORMOTEROL TARTRATE 15 MCG/2ML IN NEBU
15.0000 ug | INHALATION_SOLUTION | Freq: Two times a day (BID) | RESPIRATORY_TRACT | Status: DC
  Administered 2015-12-28 – 2016-01-05 (×10): 15 ug via RESPIRATORY_TRACT
  Filled 2015-12-28 (×19): qty 2

## 2015-12-28 MED ORDER — METHYLPREDNISOLONE SODIUM SUCC 125 MG IJ SOLR
60.0000 mg | Freq: Four times a day (QID) | INTRAMUSCULAR | Status: DC
  Administered 2015-12-28 – 2015-12-31 (×13): 60 mg via INTRAVENOUS
  Filled 2015-12-28 (×12): qty 2

## 2015-12-28 MED ORDER — ACETAMINOPHEN 650 MG RE SUPP
650.0000 mg | Freq: Four times a day (QID) | RECTAL | Status: DC | PRN
Start: 2015-12-28 — End: 2016-01-05

## 2015-12-28 MED ORDER — DEXTROSE 5 % IV SOLN
1.0000 g | Freq: Three times a day (TID) | INTRAVENOUS | Status: DC
  Administered 2015-12-28 – 2016-01-02 (×14): 1 g via INTRAVENOUS
  Filled 2015-12-28 (×15): qty 1

## 2015-12-28 MED ORDER — INSULIN ASPART 100 UNIT/ML ~~LOC~~ SOLN
0.0000 [IU] | Freq: Three times a day (TID) | SUBCUTANEOUS | Status: DC
  Administered 2015-12-28: 8 [IU] via SUBCUTANEOUS
  Administered 2015-12-28: 15 [IU] via SUBCUTANEOUS
  Administered 2015-12-29: 11 [IU] via SUBCUTANEOUS
  Administered 2015-12-29 (×2): 5 [IU] via SUBCUTANEOUS
  Administered 2015-12-30: 8 [IU] via SUBCUTANEOUS
  Administered 2015-12-30: 3 [IU] via SUBCUTANEOUS
  Administered 2015-12-30 – 2015-12-31 (×2): 5 [IU] via SUBCUTANEOUS
  Administered 2015-12-31: 8 [IU] via SUBCUTANEOUS
  Administered 2015-12-31: 11 [IU] via SUBCUTANEOUS
  Administered 2016-01-01 – 2016-01-02 (×5): 5 [IU] via SUBCUTANEOUS
  Administered 2016-01-02: 3 [IU] via SUBCUTANEOUS
  Administered 2016-01-03: 8 [IU] via SUBCUTANEOUS
  Administered 2016-01-03 (×2): 5 [IU] via SUBCUTANEOUS
  Administered 2016-01-04: 8 [IU] via SUBCUTANEOUS
  Administered 2016-01-04: 11 [IU] via SUBCUTANEOUS
  Administered 2016-01-04: 3 [IU] via SUBCUTANEOUS
  Administered 2016-01-05: 2 [IU] via SUBCUTANEOUS

## 2015-12-28 MED ORDER — GLUCERNA SHAKE PO LIQD
237.0000 mL | Freq: Three times a day (TID) | ORAL | Status: DC
  Administered 2015-12-28 – 2016-01-05 (×24): 237 mL via ORAL
  Filled 2015-12-28 (×27): qty 237

## 2015-12-28 MED ORDER — LORATADINE 10 MG PO TABS
10.0000 mg | ORAL_TABLET | Freq: Every day | ORAL | Status: DC
  Administered 2015-12-28 – 2016-01-05 (×9): 10 mg via ORAL
  Filled 2015-12-28 (×9): qty 1

## 2015-12-28 MED ORDER — TIOTROPIUM BROMIDE MONOHYDRATE 18 MCG IN CAPS
18.0000 ug | ORAL_CAPSULE | Freq: Every day | RESPIRATORY_TRACT | Status: DC
  Administered 2015-12-28 – 2016-01-05 (×9): 18 ug via RESPIRATORY_TRACT
  Filled 2015-12-28 (×2): qty 5

## 2015-12-28 MED ORDER — MORPHINE SULFATE (PF) 2 MG/ML IV SOLN
2.0000 mg | INTRAVENOUS | Status: DC | PRN
  Administered 2015-12-31: 2 mg via INTRAVENOUS
  Filled 2015-12-28: qty 1

## 2015-12-28 MED ORDER — INSULIN ASPART 100 UNIT/ML ~~LOC~~ SOLN
0.0000 [IU] | Freq: Every day | SUBCUTANEOUS | Status: DC
  Administered 2015-12-29 – 2015-12-31 (×3): 2 [IU] via SUBCUTANEOUS
  Administered 2016-01-01: 0 [IU] via SUBCUTANEOUS
  Administered 2016-01-02: 2 [IU] via SUBCUTANEOUS
  Administered 2016-01-03: 3 [IU] via SUBCUTANEOUS
  Administered 2016-01-04: 2 [IU] via SUBCUTANEOUS

## 2015-12-28 MED ORDER — APIXABAN 5 MG PO TABS
5.0000 mg | ORAL_TABLET | Freq: Two times a day (BID) | ORAL | Status: DC
  Administered 2015-12-28 – 2016-01-05 (×17): 5 mg via ORAL
  Filled 2015-12-28 (×18): qty 1

## 2015-12-28 NOTE — ED Notes (Signed)
Bed: WA18 Expected date:  Expected time:  Means of arrival:  Comments: EMS 

## 2015-12-28 NOTE — Progress Notes (Signed)
Attempted to obtain ABG x2. VBG obtained from RN. MD aware and ABG cancelled. VBG results in EPIC.

## 2015-12-28 NOTE — Progress Notes (Signed)
Pharmacy Antibiotic Note  Brenda Wells is a 75 y.o. female with severe COPD, AFib, HFpEF admitted on 12/28/2015 with HCAP. [facility]; treated with vanc/cefepime > doxycycline at that time.   Pharmacy has been consulted for vancomycin dosing; also ordered Zosyn x 1 >> cefepime per MD.  Plan:  Vancomycin 1000 mg IV now, then 500 mg IV q12 hr; goal trough 15-20 mcg/mL ([facility] same regimen with similar CrCl)  Measure vancomycin trough levels at steady state as indicated  Cefepime dosing is appropriate for indication and renal function Follow clinical course, renal function, culture results as available Follow for de-escalation of antibiotics and LOT     Temp (24hrs), Avg:98 F (36.7 C), Min:98 F (36.7 C), Max:98 F (36.7 C)   Recent Labs Lab 12/22/15 0830 12/23/15 0444 12/25/15 0610 12/26/15 0431 12/27/15 0542 12/28/15 0945 12/28/15 1018  WBC  --  17.2* 17.3*  --   --  32.5*  --   CREATININE  --  0.99 1.02* 1.08* 1.01* 0.78  --   LATICACIDVEN  --   --   --   --   --   --  1.8  VANCOTROUGH 19  --   --   --   --   --   --     Estimated Creatinine Clearance: 55.5 mL/min (by C-G formula based on Cr of 0.78).    No Known Allergies  Antimicrobials this admission: vanc 4/11 >>  cefepime 4/11 >>   Dose adjustments this admission: ---  Microbiology results: 2 hr Hx neg in Epic 4/3 BCx: NGF 4/11 BCx: sent (released after 1st doses given) 4/11 UCx: sent (released after 1st doses given)  Thank you for allowing pharmacy to be a part of this patient's care.  Bernadene Personrew Yahayra Geis, PharmD, BCPS Pager: [phone] 12/28/2015, 12:02 PM

## 2015-12-28 NOTE — H&P (Signed)
[facility] History and Physical  Brenda Wells:[id number] DOB: 20-Sep-1940 DOA: 12/28/2015  Referring physician: Dr. Patria Mane PCP: [name], MD   Chief Complaint: Dyspnea  HPI: Brenda Wells is a 75 y.o. female past medical history of severe COPD on 3 L of oxygen at home with ongoing tobacco abuse, chronic atrial fibrillation on anticoagulation, chronic diastolic heart failure, multiple admissions in the last 6 months recently discharged on 12/27/2015 With severe COPD exacerbation/healthcare associated pneumonia hospice and palliative care evaluated the patient at that time and the patient was made DNR/DNI, but no arrangements were made for hospice to follow her at home she went home and comes in today for severe dyspnea at rest progressively getting worst, the patient relates she continues to smoke. She denies any sick contacts, or new medications besides the one prescribed at the hospital during her admission. She relates she doesn't want to go to a facility she would like to go home with hospice and she is more stable. Denies any productive cough fever diarrhea.  In the ED: She was found to have a leukocytosis of 32 hypoxic in the 80s on 3 L. We are consulted for further evaluation.   Review of Systems:  Constitutional:  No weight loss, night sweats, Fevers, chills, fatigue.  HEENT:  No headaches, Difficulty swallowing,Tooth/dental problems,Sore throat,  No sneezing, itching, ear ache, nasal congestion, post nasal drip,  Cardio-vascular:  No chest pain, Orthopnea, PND, swelling in lower extremities, anasarca, dizziness, palpitations  GI:  No heartburn, indigestion, abdominal pain, nausea, vomiting, diarrhea, change in bowel habits, loss of appetite  Resp:  No change in color of mucus.No wheezing.No chest wall deformity  Skin:  no rash or lesions.  GU:  no dysuria, change in color of urine, no urgency or frequency. No flank pain.  Musculoskeletal:  No joint pain  or swelling. No decreased range of motion. No back pain.  Psych:  No change in mood or affect. No depression or anxiety. No memory loss.   Past Medical History  Diagnosis Date  . Acute respiratory failure (HCC)   . Community acquired pneumonia   . COPD with acute exacerbation (HCC)   . Anemia   . Hypokalemia   . Hyponatremia   . CHF (congestive heart failure) (HCC)   . Hypertension   . Shortness of breath dyspnea   . History of kidney stones   . Atrial fibrillation [facility])    Past Surgical History  Procedure Laterality Date  . Back surgery    . Cyst excision      base of tongue  . Cholecystectomy    . Abdominal hysterectomy     Social History:  reports that she has been smoking Cigarettes.  She has a 6 pack-year smoking history. She has never used smokeless tobacco. She reports that she does not drink alcohol or use illicit drugs.  No Known Allergies  Family History  Problem Relation Age of Onset  . Diabetes Mellitus II Mother   . CAD Mother   . CAD Brother     Prior to Admission medications   Medication Sig Start Date End Date Taking? Authorizing Provider  albuterol (PROVENTIL) (2.5 MG/3ML) 0.083% nebulizer solution Take 3 mLs (2.5 mg total) by nebulization every 2 (two) hours as needed for wheezing or shortness of breath. 09/07/15  Yes Richarda Overlie, MD  apixaban (ELIQUIS) 5 MG TABS tablet Take 1 tablet (5 mg total) by mouth 2 (two) times daily. 10/23/15  Yes Ripudeep Jenna Luo, MD  arformoterol (BROVANA) 15 MCG/2ML NEBU Take 2 mLs (15 mcg total) by nebulization 2 (two) times daily. 09/07/15  Yes Richarda Overlie, MD  diltiazem (CARDIZEM CD) 240 MG 24 hr capsule Take 2 capsules (480 mg total) by mouth daily. 12/27/15  Yes Albertine Grates, MD  famotidine (PEPCID) 20 MG tablet Take 1 tablet (20 mg total) by mouth at bedtime. 12/26/15  Yes Albertine Grates, MD  feeding supplement, GLUCERNA SHAKE, (GLUCERNA SHAKE) LIQD Take 237 mLs by mouth 3 (three) times daily between meals. 11/25/15  Yes Shanker Levora Dredge,  MD  Fluticasone-Salmeterol (ADVAIR) 500-50 MCG/DOSE AEPB Inhale 1 puff into the lungs 2 (two) times daily.   Yes Historical Provider, MD  furosemide (LASIX) 40 MG tablet Take 1 tablet (40 mg total) by mouth daily. May take additional one tab in pm if increased swelling 12/27/15  Yes Albertine Grates, MD  gabapentin (NEURONTIN) 100 MG capsule Take 1 capsule (100 mg total) by mouth at bedtime. 12/27/15  Yes Albertine Grates, MD  glipiZIDE (GLUCOTROL) 5 MG tablet Take 1 tablet (5 mg total) by mouth daily before breakfast. 11/25/15  Yes Shanker Levora Dredge, MD  guaiFENesin (MUCINEX) 600 MG 12 hr tablet Take 1 tablet (600 mg total) by mouth 2 (two) times daily. 10/23/15  Yes Ripudeep K Rai, MD  ipratropium-albuterol (DUONEB) 0.5-2.5 (3) MG/3ML SOLN Take 3 mLs by nebulization 3 (three) times daily. 10/23/15  Yes Ripudeep Jenna Luo, MD  loratadine (CLARITIN) 10 MG tablet Take 10 mg by mouth daily.   Yes Historical Provider, MD  omeprazole (PRILOSEC) 20 MG capsule Take 20 mg by mouth daily.   Yes Historical Provider, MD  ondansetron (ZOFRAN) 4 MG tablet Take 4 mg by mouth every 6 (six) hours as needed for nausea or vomiting.  11/14/15  Yes Historical Provider, MD  predniSONE (DELTASONE) 10 MG tablet Take  bid for three days, then  po daily for three days, then back to baseline  daily and continue. Patient taking differently: Take 10-20 mg by mouth See admin instructions. Take  bid for three days, then  po daily for three days, then back to baseline  daily and continue. 12/27/15  Yes Albertine Grates, MD  sennosides-docusate sodium (SENOKOT-S) 8.6-50 MG tablet Take 2 tablets by mouth 2 (two) times daily.   Yes Historical Provider, MD  zolpidem (AMBIEN) 5 MG tablet Take 1 tablet (5 mg total) by mouth at bedtime as needed for sleep. 11/25/15  Yes Shanker Levora Dredge, MD  doxycycline (VIBRA-TABS) 100 MG tablet Take 1 tablet (100 mg total) by mouth every 12 (twelve) hours. Patient not taking: Reported on 12/28/2015 12/27/15   Albertine Grates, MD    oxyCODONE (OXY IR/ROXICODONE) 5 MG immediate release tablet Take 1 tablet (5 mg total) by mouth every 4 (four) hours as needed for moderate pain (dyspnea, SOB). Patient not taking: Reported on 12/28/2015 12/27/15   Albertine Grates, MD   Physical Exam: Filed Vitals:   12/28/15 0857 12/28/15 1051  BP: 155/80 110/50  Pulse: 92 129  Temp: 98 F (36.7 C)   TempSrc: Oral   Resp: 20 25  SpO2: 96% 95%    Wt Readings from Last 3 Encounters:  12/26/15 65.318 kg (144 lb)  11/25/15 62.7 kg (138 lb 3.7 oz)  10/23/15 61.598 kg (135 lb 12.8 oz)    General:  Appears calm and comfortable Eyes: PERRL, normal lids, irises & conjunctiva ENT: grossly normal hearing, lips & tongue Neck: no LAD, masses or thyromegaly Cardiovascular: RRR, no m/r/g. No LE  edema. Telemetry: SR, no arrhythmias  Respiratory:  she has poor air movement and wheezing bilaterally. Abdomen: soft, ntnd Skin: no rash or induration seen on limited exam Musculoskeletal: grossly normal tone BUE/BLE Psychiatric: grossly normal mood and affect, speech fluent and appropriate Neurologic: grossly non-focal.          Labs on Admission:  Basic Metabolic Panel:  Recent Labs Lab 12/23/15 0444 12/25/15 0610 12/26/15 0431 12/27/15 0542 12/28/15 0945  NA 140 139 139 137 143  K 4.5 4.2 3.8 4.3 4.2  CL 96* 90* 88* 86* 93*  CO2 30 37* 37* 38* 39*  GLUCOSE 223* 216* 246* 201* 129*  BUN 35* 35* 33* 40* 38*  CREATININE 0.99 1.02* 1.08* 1.01* 0.78  CALCIUM 8.8* 8.6* 8.6* 8.6* 8.7*  MG  --  2.2 1.9 2.1  --    Liver Function Tests:  Recent Labs Lab 12/28/15 0945  AST 20  ALT 23  ALKPHOS 50  BILITOT 0.2*  PROT 5.9*  ALBUMIN 3.4*   No results for input(s): LIPASE, AMYLASE in the last 168 hours. No results for input(s): AMMONIA in the last 168 hours. CBC:  Recent Labs Lab 12/23/15 0444 12/25/15 0610 12/28/15 0945  WBC 17.2* 17.3* 32.5*  NEUTROABS  --   --  25.7*  HGB 9.3* 9.6* 10.5*  HCT 31.5* 32.7* 34.3*  MCV 88.0 87.2  88.2  PLT 331 326 368   Cardiac Enzymes:  Recent Labs Lab 12/28/15 0945  TROPONINI 0.03    BNP (last 3 results)  Recent Labs  11/21/15 0402 12/20/15 0448 12/28/15 1009  BNP 284.2* 68.6 102.6*    ProBNP (last 3 results) No results for input(s): PROBNP in the last 8760 hours.  CBG:  Recent Labs Lab 12/26/15 1136 12/26/15 1615 12/26/15 2040 12/27/15 0748 12/27/15 1148  GLUCAP 204* 293* 191* 154* 195*    Radiological Exams on Admission: Dg Chest 2 View  12/28/2015  CLINICAL DATA:  76 year old female with increasing shortness of Breath after discharge from the hospital yesterday. Recent left side Healthcare associated pneumonia with small pleural effusion. Initial encounter. EXAM: CHEST  2 VIEW COMPARISON:  12/20/2015 and earlier. FINDINGS: Semi upright AP and lateral views of the chest. Stable large lung volumes and cardiomegaly. Recurrent superior segment left lower lobe collapse or consolidation, appearing similar to that seen on the chest CTA 09/29/2015. Trace left pleural effusion versus pleural scarring not significantly changed since that time. No other confluent pulmonary opacity. No pneumothorax or pulmonary edema. Visualized tracheal air column is within normal limits. Osteopenia. No acute osseous abnormality identified. Chronic right lateral sixth rib fracture. Extensive calcified aortic atherosclerosis. IMPRESSION: 1. Recurrent superior segment left upper lobe consolidation or collapse, appearing similar to that on the chest CTA 07/19/2016. 2. Small left pleural effusion versus pleural scarring has not significantly changed since January. 3. Underlying chronic hyperinflation, cardiomegaly, and calcified aortic atherosclerosis. Electronically Signed   By: Odessa Fleming M.D.   On: 12/28/2015 09:26    EKG: Independently reviewed. None  Assessment/Plan Acute on chronic respiratory failure with hypoxia (HCC/  COPD exacerbation (HCC) in the setting of ongoing tobacco  abuse: Admitted to MedSurg, start her on IV steroids inhalers and empiric antibiotics for healthcare associated pneumonia. Her white count never did come down from last admission. Chest x-ray does not show a new infiltrate. Continue her on nasal cannula 3-6 L, continue inhalers. We'll consult hospice and padded care for further evaluation and see if they can follow her at home. She relates she  will elect to go home with hospice. Use morphine for dyspnea.  Chronic atrial fibrillation (HCC) Controlled, continue Eluquis.  Leukocytosis: unclear question if there is a portion of her leukocytosis that is due to steroids, but it's even higher than when she was discharged.  I will Her with empiric antibiotics vancomycin and cefepime in case there is an underlying pulmonary primary infection.    Type II diabetes mellitus (HCC):  continue her glipizide at home started on sliding scale insulin plus low-dose long-acting.   Code Status: DNR/DNI DVT Prophylaxis:heparin Family Communication: none Disposition Plan: home in 3-4 days  Time spent: 65min  Marinda ElkFELIZ ORTIZ, Lorain Fettes [facility] Pager [phone]

## 2015-12-28 NOTE — Progress Notes (Signed)
[facility] home after [facility]   [facility] and [facility]  [er [facility] note pt would call [facility] herself plus CM sent [facility] a d/c summary and face sheet at pt d/c for services  Pt informed admitting MD she did not get home hospice, continues to smoke at home,  return to ED with sats per ED nursing in 80s increased with O2 and respiratory tx   Spoke with Wellsite geologistMedical director

## 2015-12-28 NOTE — ED Notes (Signed)
EMS called to home.  Found patient with complaints of shortness of breath.  Patient has taken two home breathing treatment Patient was 86% ORA.  [facility] and seen for COPD and PNA.  Patient was DC and at home patient States that she smoked a cigarette at home and her symptoms started.

## 2015-12-28 NOTE — ED Provider Notes (Signed)
CSN: [id number]     Arrival date & time 12/28/15  0849 History   First MD Initiated Contact with Patient 12/28/15 [phone]     Chief Complaint  Patient presents with  . Shortness of Breath      HPI Patient presents to the emergency department with increasing shortness of breath.  She has a history of oxygen dependent COPD for which she wears 3 L home oxygen.  She was [facility] yesterday for COPD exacerbation.  She states that she did not feels that she was ready to go home and she reports that her breathing is worsened since being discharged.  At some point during the hospitalization he lost IV access and she was treated with oral antibiotics towards the end of her hospitalization.  She was discharged home on prednisone.  She reports she's tried albuterol at home without improvement in her symptoms.  She also had a palliative care consultation in the hospital given her severe end-stage COPD.  At that time it was reiterated that she would take BiPAP for symptom control but did not want to be intubated and did not want CPR performed in the event of an acute cardiac arrest.   Past Medical History  Diagnosis Date  . Acute respiratory failure (HCC)   . Community acquired pneumonia   . COPD with acute exacerbation (HCC)   . Anemia   . Hypokalemia   . Hyponatremia   . CHF (congestive heart failure) (HCC)   . Hypertension   . Shortness of breath dyspnea   . History of kidney stones   . Atrial fibrillation [facility](HCC)    Past Surgical History  Procedure Laterality Date  . Back surgery    . Cyst excision      base of tongue  . Cholecystectomy    . Abdominal hysterectomy     Family History  Problem Relation Age of Onset  . Diabetes Mellitus II Mother   . CAD Mother   . CAD Brother    Social History  Substance Use Topics  . Smoking status: Current Every Day Smoker -- 0.10 packs/day for 60 years    Types: Cigarettes  . Smokeless tobacco: Never Used     Comment:  I smoke 1 cigarette a day "  . Alcohol Use: No   OB History    No data available     Review of Systems  All other systems reviewed and are negative.     Allergies  Review of patient's allergies indicates no known allergies.  Home Medications   Prior to Admission medications   Medication Sig Start Date End Date Taking? Authorizing Provider  albuterol (PROVENTIL) (2.5 MG/3ML) 0.083% nebulizer solution Take 3 mLs (2.5 mg total) by nebulization every 2 (two) hours as needed for wheezing or shortness of breath. 09/07/15  Yes Richarda OverlieNayana Abrol, MD  apixaban (ELIQUIS) 5 MG TABS tablet Take 1 tablet (5 mg total) by mouth 2 (two) times daily. 10/23/15  Yes Ripudeep Jenna LuoK Rai, MD  arformoterol (BROVANA) 15 MCG/2ML NEBU Take 2 mLs (15 mcg total) by nebulization 2 (two) times daily. 09/07/15  Yes Richarda OverlieNayana Abrol, MD  diltiazem (CARDIZEM CD) 240 MG 24 hr capsule Take 2 capsules (480 mg total) by mouth daily. 12/27/15  Yes Albertine GratesFang Xu, MD  famotidine (PEPCID) 20 MG tablet Take 1 tablet (20 mg total) by mouth at bedtime. 12/26/15  Yes Albertine GratesFang Xu, MD  feeding supplement, GLUCERNA SHAKE, (GLUCERNA SHAKE) LIQD Take 237 mLs by mouth  3 (three) times daily between meals. 11/25/15  Yes Shanker Levora Dredge, MD  Fluticasone-Salmeterol (ADVAIR) 500-50 MCG/DOSE AEPB Inhale 1 puff into the lungs 2 (two) times daily.   Yes Historical Provider, MD  furosemide (LASIX) 40 MG tablet Take 1 tablet (40 mg total) by mouth daily. May take additional one tab in pm if increased swelling 12/27/15  Yes Albertine Grates, MD  gabapentin (NEURONTIN) 100 MG capsule Take 1 capsule (100 mg total) by mouth at bedtime. 12/27/15  Yes Albertine Grates, MD  glipiZIDE (GLUCOTROL) 5 MG tablet Take 1 tablet (5 mg total) by mouth daily before breakfast. 11/25/15  Yes Shanker Levora Dredge, MD  guaiFENesin (MUCINEX) 600 MG 12 hr tablet Take 1 tablet (600 mg total) by mouth 2 (two) times daily. 10/23/15  Yes Ripudeep K Rai, MD  ipratropium-albuterol (DUONEB) 0.5-2.5 (3) MG/3ML SOLN Take 3 mLs by  nebulization 3 (three) times daily. 10/23/15  Yes Ripudeep Jenna Luo, MD  loratadine (CLARITIN) 10 MG tablet Take 10 mg by mouth daily.   Yes Historical Provider, MD  omeprazole (PRILOSEC) 20 MG capsule Take 20 mg by mouth daily.   Yes Historical Provider, MD  ondansetron (ZOFRAN) 4 MG tablet Take 4 mg by mouth every 6 (six) hours as needed for nausea or vomiting.  11/14/15  Yes Historical Provider, MD  predniSONE (DELTASONE) 10 MG tablet Take  bid for three days, then  po daily for three days, then back to baseline  daily and continue. Patient taking differently: Take 10-20 mg by mouth See admin instructions. Take  bid for three days, then  po daily for three days, then back to baseline  daily and continue. 12/27/15  Yes Albertine Grates, MD  sennosides-docusate sodium (SENOKOT-S) 8.6-50 MG tablet Take 2 tablets by mouth 2 (two) times daily.   Yes Historical Provider, MD  zolpidem (AMBIEN) 5 MG tablet Take 1 tablet (5 mg total) by mouth at bedtime as needed for sleep. 11/25/15  Yes Shanker Levora Dredge, MD  doxycycline (VIBRA-TABS) 100 MG tablet Take 1 tablet (100 mg total) by mouth every 12 (twelve) hours. Patient not taking: Reported on 12/28/2015 12/27/15   Albertine Grates, MD  oxyCODONE (OXY IR/ROXICODONE) 5 MG immediate release tablet Take 1 tablet (5 mg total) by mouth every 4 (four) hours as needed for moderate pain (dyspnea, SOB). Patient not taking: Reported on 12/28/2015 12/27/15   Albertine Grates, MD   BP 110/50 mmHg  Pulse 129  Temp(Src) 98 F (36.7 C) (Oral)  Resp 25  SpO2 95% Physical Exam  Constitutional: She is oriented to person, place, and time. She appears well-developed and well-nourished. No distress.  HENT:  Head: Normocephalic and atraumatic.  Eyes: EOM are normal.  Neck: Normal range of motion.  Cardiovascular:  Tachycardic.  Irregularly irregular  Pulmonary/Chest: Effort normal. She has wheezes.  Tachypnea with mild accessory muscle use  Abdominal: Soft. She exhibits no  distension. There is no tenderness.  Musculoskeletal: Normal range of motion.  Neurological: She is alert and oriented to person, place, and time.  Skin: Skin is warm and dry.  Psychiatric: She has a normal mood and affect. Judgment normal.  Nursing note and vitals reviewed.   ED Course  Procedures (including critical care time) Labs Review Labs Reviewed  CBC WITH DIFFERENTIAL/PLATELET - Abnormal; Notable for the following:    WBC 32.5 (*)    Hemoglobin 10.5 (*)    HCT 34.3 (*)    RDW 17.7 (*)    Neutro Abs 25.7 (*)  Lymphs Abs 4.2 (*)    Monocytes Absolute 2.6 (*)    All other components within normal limits  COMPREHENSIVE METABOLIC PANEL - Abnormal; Notable for the following:    Chloride 93 (*)    CO2 39 (*)    Glucose, Bld 129 (*)    BUN 38 (*)    Calcium 8.7 (*)    Total Protein 5.9 (*)    Albumin 3.4 (*)    Total Bilirubin 0.2 (*)    All other components within normal limits  BRAIN NATRIURETIC PEPTIDE - Abnormal; Notable for the following:    B Natriuretic Peptide 102.6 (*)    All other components within normal limits  BLOOD GAS, VENOUS - Abnormal; Notable for the following:    pH, Ven 7.420 (*)    pCO2, Ven 62.8 (*)    pO2, Ven 65.8 (*)    Bicarbonate 40.0 (*)    Acid-Base Excess 13.5 (*)    All other components within normal limits  TROPONIN I  LACTIC ACID, PLASMA    Imaging Review Dg Chest 2 View  12/28/2015  CLINICAL DATA:  75 year old female with increasing shortness of Breath after discharge from the hospital yesterday. Recent left side Healthcare associated pneumonia with small pleural effusion. Initial encounter. EXAM: CHEST  2 VIEW COMPARISON:  12/20/2015 and earlier. FINDINGS: Semi upright AP and lateral views of the chest. Stable large lung volumes and cardiomegaly. Recurrent superior segment left lower lobe collapse or consolidation, appearing similar to that seen on the chest CTA 09/29/2015. Trace left pleural effusion versus pleural scarring not  significantly changed since that time. No other confluent pulmonary opacity. No pneumothorax or pulmonary edema. Visualized tracheal air column is within normal limits. Osteopenia. No acute osseous abnormality identified. Chronic right lateral sixth rib fracture. Extensive calcified aortic atherosclerosis. IMPRESSION: 1. Recurrent superior segment left upper lobe consolidation or collapse, appearing similar to that on the chest CTA 07/19/2016. 2. Small left pleural effusion versus pleural scarring has not significantly changed since January. 3. Underlying chronic hyperinflation, cardiomegaly, and calcified aortic atherosclerosis. Electronically Signed   By: Odessa Fleming M.D.   On: 12/28/2015 09:26   I have personally reviewed and evaluated these images and lab results as part of my medical decision-making.  ECG interpretation   Date: 12/28/2015  Rate: 104  Rhythm: atrial fibrillation with RVR  QRS Axis: normal  Intervals: normal  ST/T Wave abnormalities: normal  Conduction Disutrbances: none  Narrative Interpretation:   Old EKG Reviewed: No significant changes noted     MDM   Final diagnoses:  COPD exacerbation (HCC)  Respiratory failure, unspecified chronicity, unspecified whether with hypoxia or hypercapnia (HCC)  Leucocytosis    11:18 AM Likely COPD exacerbation with possible ongoing healthcare associated pneumonia.  Leukocytosis noted.  Afebrile here in the emergency department.  Recently discharged from the hospitalist service yesterday.  She has end-stage COPD that is oxygen dependent.  She did have a palliative care consultation in the hospital was scheduled to see home hospice but they stated that she would need to come to the emergency department for symptom control.  Patient still to Neck and feeling short of breath at this time.  She will need acute stabilization in the hospital.  SciMed are given.  Albuterol and Atrovent given.  Patient written for IV antibiotics to cover for  healthcare associated pneumonia.    Azalia Bilis, MD 12/28/15 1121

## 2015-12-29 LAB — GLUCOSE, CAPILLARY
GLUCOSE-CAPILLARY: 249 mg/dL — AB (ref 65–99)
GLUCOSE-CAPILLARY: 235 mg/dL — AB (ref 65–99)
Glucose-Capillary: 321 mg/dL — ABNORMAL HIGH (ref 65–99)
Glucose-Capillary: 224 mg/dL — ABNORMAL HIGH (ref 65–99)

## 2015-12-29 LAB — CBC
HCT: 28.9 % — ABNORMAL LOW (ref 36.0–46.0)
Hemoglobin: 9 g/dL — ABNORMAL LOW (ref 12.0–15.0)
MCH: 26.7 pg (ref 26.0–34.0)
MCHC: 31.1 g/dL (ref 30.0–36.0)
MCV: 85.8 fL (ref 78.0–100.0)
PLATELETS: 286 10*3/uL (ref 150–400)
RBC: 3.37 MIL/uL — ABNORMAL LOW (ref 3.87–5.11)
RDW: 17.5 % — ABNORMAL HIGH (ref 11.5–15.5)
WBC: 18.8 10*3/uL — ABNORMAL HIGH (ref 4.0–10.5)

## 2015-12-29 LAB — COMPREHENSIVE METABOLIC PANEL
ALK PHOS: 47 U/L (ref 38–126)
ALT: 26 U/L (ref 14–54)
ANION GAP: 13 (ref 5–15)
AST: 24 U/L (ref 15–41)
Albumin: 3.1 g/dL — ABNORMAL LOW (ref 3.5–5.0)
BUN: 32 mg/dL — ABNORMAL HIGH (ref 6–20)
CALCIUM: 8.8 mg/dL — AB (ref 8.9–10.3)
CHLORIDE: 91 mmol/L — AB (ref 101–111)
CO2: 34 mmol/L — AB (ref 22–32)
Creatinine, Ser: 0.82 mg/dL (ref 0.44–1.00)
GFR calc non Af Amer: >60 mL/min (ref >60)
Glucose, Bld: 303 mg/dL — ABNORMAL HIGH (ref 65–99)
Potassium: 4.9 mmol/L (ref 3.5–5.1)
SODIUM: 138 mmol/L (ref 135–145)
Total Bilirubin: 0.2 mg/dL — ABNORMAL LOW (ref 0.3–1.2)
Total Protein: 5.5 g/dL — ABNORMAL LOW (ref 6.5–8.1)

## 2015-12-29 LAB — HEMOGLOBIN A1C
HEMOGLOBIN A1C: 7.6 % — AB (ref 4.8–5.6)
Mean Plasma Glucose: 171 mg/dL

## 2015-12-29 NOTE — Progress Notes (Addendum)
[facility] PROGRESS NOTE  Brenda Wells EAV:[id number] DOB: March 09, 1941 DOA: 12/28/2015 PCP: [name], MD  Assessment/Plan: Acute on chronic respiratory failure with hypoxia (HCC/ COPD exacerbation (HCC) in the setting of ongoing tobacco abuse. PNA Continue her on nasal cannula 3- L, continue inhalers. She relates she will elect to go home with hospice. Use morphine for dyspnea. IV antibiotics, nebulizer,  IV solumedrol.  CM consulted to arrange Home Hospice.   Chronic atrial fibrillation (HCC) Controlled, continue Eluquis.  Leukocytosis: unclear question if there is a portion of her leukocytosis that is due to steroids, but it's even higher than when she was discharged.  Continue with IV antibiotics.  WBC trending down.    Type II diabetes mellitus (HCC): continue her glipizide at home started on sliding scale insulin plus low-dose long-acting.   Elevation BUN;  Related to steroids. Less likely renal failure.   Code Status: DNR Family Communication: care discussed with patient  Disposition Plan: home when stable   Consultants:  Palliative care  Procedures:  none  Antibiotics:  Cefepime  Vancomycin   HPI/Subjective: Feeling better, still with cough and SOB.    Objective: Filed Vitals:   12/29/15 1001 12/29/15 1433  BP: 136/73   Pulse:  101  Temp:    Resp:  19    Intake/Output Summary (Last 24 hours) at 12/29/15 1544 Last data filed at 12/29/15 1115  Gross per 24 hour  Intake    360 ml  Output   2700 ml  Net  -2340 ml   Filed Weights   12/28/15 1300 12/29/15 0559  Weight: 67.7 kg (149 lb 4 oz) 68.9 kg (151 lb 14.4 oz)    Exam:   General:  NAD  Cardiovascular: S 1, S 2 RRR  Respiratory: CTA  Abdomen: BS present, soft, nt  Musculoskeletal: no edema   Data Reviewed: Basic Metabolic Panel:  Recent Labs Lab 12/25/15 0610 12/26/15 0431 12/27/15 0542 12/28/15 0945 12/29/15 0347  NA 139 139 137 143 138  K 4.2 3.8  4.3 4.2 4.9  CL 90* 88* 86* 93* 91*  CO2 37* 37* 38* 39* 34*  GLUCOSE 216* 246* 201* 129* 303*  BUN 35* 33* 40* 38* 32*  CREATININE 1.02* 1.08* 1.01* 0.78 0.82  CALCIUM 8.6* 8.6* 8.6* 8.7* 8.8*  MG 2.2 1.9 2.1  --   --    Liver Function Tests:  Recent Labs Lab 12/28/15 0945 12/29/15 0347  AST 20 24  ALT 23 26  ALKPHOS 50 47  BILITOT 0.2* 0.2*  PROT 5.9* 5.5*  ALBUMIN 3.4* 3.1*   No results for input(s): LIPASE, AMYLASE in the last 168 hours. No results for input(s): AMMONIA in the last 168 hours. CBC:  Recent Labs Lab 12/23/15 0444 12/25/15 0610 12/28/15 0945 12/29/15 0347  WBC 17.2* 17.3* 32.5* 18.8*  NEUTROABS  --   --  25.7*  --   HGB 9.3* 9.6* 10.5* 9.0*  HCT 31.5* 32.7* 34.3* 28.9*  MCV 88.0 87.2 88.2 85.8  PLT 331 326 368 286   Cardiac Enzymes:  Recent Labs Lab 12/28/15 0945  TROPONINI 0.03   BNP (last 3 results)  Recent Labs  11/21/15 0402 12/20/15 0448 12/28/15 1009  BNP 284.2* 68.6 102.6*    ProBNP (last 3 results) No results for input(s): PROBNP in the last 8760 hours.  CBG:  Recent Labs Lab 12/28/15 1448 12/28/15 1659 12/28/15 2058 12/29/15 0744 12/29/15 1139  GLUCAP 396* 258* 184* 249* 321*    Recent Results (from the past  240 hour(s))  Culture, blood (Routine X 2) w Reflex to ID Panel     Status: None   Collection Time: 12/20/15  5:12 AM  Result Value Ref Range Status   Specimen Description BLOOD RIGHT ANTECUBITAL  Final   Special Requests   Final    BOTTLES DRAWN AEROBIC AND ANAEROBIC 5CC RED 10CC BLUE   Culture NO GROWTH 5 DAYS  Final   Report Status 12/25/2015 FINAL  Final  Culture, blood (Routine X 2) w Reflex to ID Panel     Status: None   Collection Time: 12/20/15  5:15 AM  Result Value Ref Range Status   Specimen Description BLOOD LEFT HAND  Final   Special Requests BOTTLES DRAWN AEROBIC AND ANAEROBIC 5CC   Final   Culture NO GROWTH 5 DAYS  Final   Report Status 12/25/2015 FINAL  Final  MRSA PCR Screening      Status: None   Collection Time: 12/20/15  5:41 PM  Result Value Ref Range Status   MRSA by PCR NEGATIVE NEGATIVE Final    Comment:        The GeneXpert MRSA Assay (FDA approved for NASAL specimens only), is one component of a comprehensive MRSA colonization surveillance program. It is not intended to diagnose MRSA infection nor to guide or monitor treatment for MRSA infections.   Culture, blood (routine x 2) Call MD if unable to obtain prior to antibiotics being given     Status: None (Preliminary result)   Collection Time: 12/28/15 10:14 AM  Result Value Ref Range Status   Specimen Description BLOOD RIGHT ANTECUBITAL  Final   Special Requests BOTTLES DRAWN AEROBIC ONLY 6ML  Final   Culture   Final    NO GROWTH < 24 HOURS Performed [facility]    Report Status PENDING  Incomplete  Culture, blood (routine x 2) Call MD if unable to obtain prior to antibiotics being given     Status: None (Preliminary result)   Collection Time: 12/28/15 12:10 PM  Result Value Ref Range Status   Specimen Description BLOOD RIGHT ANTECUBITAL  Final   Special Requests BOTTLES DRAWN AEROBIC AND ANAEROBIC 5ML  Final   Culture   Final    NO GROWTH < 24 HOURS Performed at [facility]    Report Status PENDING  Incomplete     Studies: Dg Chest 2 View  12/28/2015  CLINICAL DATA:  75 year old female with increasing shortness of Breath after discharge from the hospital yesterday. Recent left side Healthcare associated pneumonia with small pleural effusion. Initial encounter. EXAM: CHEST  2 VIEW COMPARISON:  12/20/2015 and earlier. FINDINGS: Semi upright AP and lateral views of the chest. Stable large lung volumes and cardiomegaly. Recurrent superior segment left lower lobe collapse or consolidation, appearing similar to that seen on the chest CTA 09/29/2015. Trace left pleural effusion versus pleural scarring not significantly changed since that time. No other confluent pulmonary opacity. No  pneumothorax or pulmonary edema. Visualized tracheal air column is within normal limits. Osteopenia. No acute osseous abnormality identified. Chronic right lateral sixth rib fracture. Extensive calcified aortic atherosclerosis. IMPRESSION: 1. Recurrent superior segment left upper lobe consolidation or collapse, appearing similar to that on the chest CTA 07/19/2016. 2. Small left pleural effusion versus pleural scarring has not significantly changed since January. 3. Underlying chronic hyperinflation, cardiomegaly, and calcified aortic atherosclerosis. Electronically Signed   By: Odessa FlemingH  Hall M.D.   On: 12/28/2015 09:26    Scheduled Meds: . albuterol  2.5  mg Nebulization Q6H  . apixaban  5 mg Oral BID  . arformoterol  15 mcg Nebulization BID  . ceFEPime (MAXIPIME) IV  1 g Intravenous Q8H  . diltiazem  480 mg Oral Daily  . famotidine  20 mg Oral QHS  . feeding supplement (GLUCERNA SHAKE)  237 mL Oral TID BM  . furosemide  40 mg Oral Daily  . gabapentin  100 mg Oral QHS  . glipiZIDE  5 mg Oral QAC breakfast  . guaiFENesin  600 mg Oral BID  . insulin aspart  0-15 Units Subcutaneous TID WC  . insulin aspart  0-5 Units Subcutaneous QHS  . insulin aspart  3 Units Subcutaneous TID WC  . insulin detemir  5 Units Subcutaneous QHS  . loratadine  10 mg Oral Daily  . methylPREDNISolone (SOLU-MEDROL) injection  60 mg Intravenous Q6H  . pantoprazole  40 mg Oral Daily  . senna-docusate  2 tablet Oral BID  . tiotropium  18 mcg Inhalation Daily  . vancomycin  500 mg Intravenous Q12H   Continuous Infusions: . sodium chloride 1,000 mL (12/28/15 2144)    Active Problems:   COPD exacerbation (HCC)   Acute on chronic respiratory failure with hypoxia (HCC)   Chronic atrial fibrillation (HCC)   DNR (do not resuscitate)   Leukocytosis   Type II diabetes mellitus (HCC)   Acute respiratory failure with hypoxia (HCC)    Time spent: 25 minutes.     Hartley Barefoot A  [facility] Pager [phone].  If 7PM-7AM, please contact night-coverage at [email or web address], password [facility] 12/29/2015, 3:44 PM  LOS: 1 day

## 2015-12-29 NOTE — Clinical Documentation Improvement (Signed)
Internal Medicine  Abnormal Lab/Test Results:   BUN 6 - 20 mg/dL 32 (H) 38 (H         Possible Clinical Conditions associated with below indicators:    Acute Renal Failure   Other Condition  Cannot Clinically Determine   Supporting Information:  Readmission w COPD Exac, w PNA, Chronic Diastolic Heart Failure, HTN  Treatment Provided: NS IV  @ 50 ml   Please exercise your independent, professional judgment when responding. A specific answer is not anticipated or expected.   Thank You,  Lavonda JumboLawanda J Antony Sian Health Information Management [place] [phone]

## 2015-12-30 DIAGNOSIS — Z7189 Other specified counseling: Secondary | ICD-10-CM

## 2015-12-30 DIAGNOSIS — Z515 Encounter for palliative care: Secondary | ICD-10-CM

## 2015-12-30 DIAGNOSIS — J9601 Acute respiratory failure with hypoxia: Secondary | ICD-10-CM

## 2015-12-30 LAB — GLUCOSE, CAPILLARY
GLUCOSE-CAPILLARY: 291 mg/dL — AB (ref 65–99)
GLUCOSE-CAPILLARY: 208 mg/dL — AB (ref 65–99)
Glucose-Capillary: 177 mg/dL — ABNORMAL HIGH (ref 65–99)
Glucose-Capillary: 246 mg/dL — ABNORMAL HIGH (ref 65–99)

## 2015-12-30 LAB — BASIC METABOLIC PANEL
Anion gap: 12 (ref 5–15)
BUN: 35 mg/dL — AB (ref 6–20)
CHLORIDE: 91 mmol/L — AB (ref 101–111)
CO2: 36 mmol/L — AB (ref 22–32)
Calcium: 8.8 mg/dL — ABNORMAL LOW (ref 8.9–10.3)
Creatinine, Ser: 0.75 mg/dL (ref 0.44–1.00)
GFR calc Af Amer: >60 mL/min (ref >60)
GFR calc non Af Amer: >60 mL/min (ref >60)
GLUCOSE: 227 mg/dL — AB (ref 65–99)
POTASSIUM: 3.9 mmol/L (ref 3.5–5.1)
Sodium: 139 mmol/L (ref 135–145)

## 2015-12-30 LAB — CBC
HEMATOCRIT: 28.8 % — AB (ref 36.0–46.0)
HEMOGLOBIN: 9 g/dL — AB (ref 12.0–15.0)
MCH: 27.2 pg (ref 26.0–34.0)
MCHC: 31.3 g/dL (ref 30.0–36.0)
MCV: 87 fL (ref 78.0–100.0)
Platelets: 307 10*3/uL (ref 150–400)
RBC: 3.31 MIL/uL — AB (ref 3.87–5.11)
RDW: 17.5 % — ABNORMAL HIGH (ref 11.5–15.5)
WBC: 26.3 10*3/uL — ABNORMAL HIGH (ref 4.0–10.5)

## 2015-12-30 MED ORDER — MORPHINE SULFATE (CONCENTRATE) 10 MG/0.5ML PO SOLN
5.0000 mg | ORAL | Status: DC | PRN
  Administered 2016-01-04 – 2016-01-05 (×6): 5 mg via ORAL
  Filled 2015-12-30 (×7): qty 0.5

## 2015-12-30 MED ORDER — ALBUTEROL SULFATE (2.5 MG/3ML) 0.083% IN NEBU
2.5000 mg | INHALATION_SOLUTION | Freq: Four times a day (QID) | RESPIRATORY_TRACT | Status: DC
  Administered 2015-12-31 – 2016-01-05 (×20): 2.5 mg via RESPIRATORY_TRACT
  Filled 2015-12-30 (×20): qty 3

## 2015-12-30 MED ORDER — SENNOSIDES-DOCUSATE SODIUM 8.6-50 MG PO TABS
2.0000 | ORAL_TABLET | Freq: Two times a day (BID) | ORAL | Status: DC
  Administered 2015-12-30 – 2016-01-05 (×10): 2 via ORAL
  Filled 2015-12-30 (×11): qty 2

## 2015-12-30 MED ORDER — ARFORMOTEROL TARTRATE 15 MCG/2ML IN NEBU: 15.0000 ug | INHALATION_SOLUTION | Freq: Two times a day (BID) | RESPIRATORY_TRACT | Status: DC

## 2015-12-30 NOTE — Progress Notes (Signed)
Notified by Sandford CrazeNora Clements,  CMRN of family request [facility] after [facility]. Chart and patient information currently under review to confirm hospice eligibility.   Spoke with the patient at bedside to initiate education related to hospice philosophy, services and team approach to care. [facility] and is requesting a change to [facility].   Patient  verbalized understanding of the information provided. Per discussion patient will likely discharge either Friday or Saturday and plans to transfer via automobile.    DME needs were discussed and patient states she does not need anything ordered.  Patient has oxygen, BSC, walkers, cane and w/c in the home.    Please send signed completed DNR form home with patient.  Patient will need prescriptions for discharge comfort medications.   [facility] aware of the above.    Completed discharge summary will need to be faxed to [facility] at [phone] when final.  Please notify [facility] when patient is ready to leave unit at discharge-call [phone].   Thank you,   Lavone NeriSusan Michail Boyte, RN,  [facility]  [phone]    Above information shared with Sandford Crazeora Clements, [facility].  Please call with any questions.

## 2015-12-30 NOTE — Progress Notes (Signed)
Dear Doctor: This patient has been identified as a candidate for PICC  for the following reason (s): IV therapy over 48 hours, drug pH or osmolality (causing phlebitis, infiltration in 24 hours), poor veins/poor circulatory system (CHF, COPD, emphysema, diabetes, steroid use, IV drug abuse, etc.) and restarts due to phlebitis and infiltration in 24 hours If you agree, please write an order for the indicated device. For any questions contact the Vascular Access Team at [phone] if no answer, please leave a message.  Thank you for supporting the early vascular access assessment program. 

## 2015-12-30 NOTE — Progress Notes (Signed)
[facility] PROGRESS NOTE  Brenda Wells ZOX:[id number] DOB: [birth year] DOA: 12/28/2015 PCP: [name],[name], MD  Assessment/Plan: Acute on chronic respiratory failure with hypoxia (HCC/ COPD exacerbation (HCC) in the setting of ongoing tobacco abuse. PNA Continue her on nasal cannula 3- L, continue inhalers. She relates she will elect to go home with hospice. Use morphine for dyspnea. IV antibiotics, nebulizer,  IV solumedrol.  CM consulted to arrange Home Hospice.  Smoking cessation counseling provided.   Chronic atrial fibrillation (HCC) Controlled, continue Eluquis.  Leukocytosis: unclear question if there is a portion of her leukocytosis that is due to steroids, but it's even higher than when she was discharged.  Continue with IV antibiotics.  WBC fluctuates.    Type II diabetes mellitus (HCC): continue her glipizide at home started on sliding scale insulin plus low-dose long-acting.   Elevation BUN;  Related to steroids. Less likely renal failure.   Code Status: DNR Family Communication: care discussed with patient  Disposition Plan: home when stable   Consultants:  Palliative care  Procedures:  none  Antibiotics:  Cefepime   Vancomycin   HPI/Subjective: Still with dyspnea, notice some improvement.  Still with bilateral wheezing    Objective: Filed Vitals:   12/30/15 0502 12/30/15 1257  BP: 123/47 111/47  Pulse: 110 119  Temp: 97.7 F (36.5 C) 98.2 F (36.8 C)  Resp: 20 18    Intake/Output Summary (Last 24 hours) at 12/30/15 1317 Last data filed at 12/30/15 1257  Gross per 24 hour  Intake   1340 ml  Output   1750 ml  Net   -410 ml   Filed Weights   12/28/15 1300 12/29/15 0559  Weight: 67.7 kg (149 lb 4 oz) 68.9 kg (151 lb 14.4 oz)    Exam:   General:  NAD  Cardiovascular: S 1, S 2 RRR  Respiratory: BL wheezing.   Abdomen: BS present, soft, nt  Musculoskeletal: no edema   Data Reviewed: Basic Metabolic  Panel:  Recent Labs Lab 12/25/15 0610 12/26/15 0431 12/27/15 0542 12/28/15 0945 12/29/15 0347 12/30/15 0357  NA 139 139 137 143 138 139  K 4.2 3.8 4.3 4.2 4.9 3.9  CL 90* 88* 86* 93* 91* 91*  CO2 37* 37* 38* 39* 34* 36*  GLUCOSE 216* 246* 201* 129* 303* 227*  BUN 35* 33* 40* 38* 32* 35*  CREATININE 1.02* 1.08* 1.01* 0.78 0.82 0.75  CALCIUM 8.6* 8.6* 8.6* 8.7* 8.8* 8.8*  MG 2.2 1.9 2.1  --   --   --    Liver Function Tests:  Recent Labs Lab 12/28/15 0945 12/29/15 0347  AST 20 24  ALT 23 26  ALKPHOS 50 47  BILITOT 0.2* 0.2*  PROT 5.9* 5.5*  ALBUMIN 3.4* 3.1*   No results for input(s): LIPASE, AMYLASE in the last 168 hours. No results for input(s): AMMONIA in the last 168 hours. CBC:  Recent Labs Lab 12/25/15 0610 12/28/15 0945 12/29/15 0347 12/30/15 0357  WBC 17.3* 32.5* 18.8* 26.3*  NEUTROABS  --  25.7*  --   --   HGB 9.6* 10.5* 9.0* 9.0*  HCT 32.7* 34.3* 28.9* 28.8*  MCV 87.2 88.2 85.8 87.0  PLT 326 368 286 307   Cardiac Enzymes:  Recent Labs Lab 12/28/15 0945  TROPONINI 0.03   BNP (last 3 results)  Recent Labs  11/21/15 0402 12/20/15 0448 12/28/15 1009  BNP 284.2* 68.6 102.6*    ProBNP (last 3 results) No results for input(s): PROBNP in the last 8760 hours.  CBG:  Recent Labs Lab 12/29/15 0744 12/29/15 1139 12/29/15 1714 12/29/15 2125 12/30/15 0746  GLUCAP 249* 321* 235* 224* 177*    Recent Results (from the past 240 hour(s))  MRSA PCR Screening     Status: None   Collection Time: 12/20/15  5:41 PM  Result Value Ref Range Status   MRSA by PCR NEGATIVE NEGATIVE Final    Comment:        The GeneXpert MRSA Assay (FDA approved for NASAL specimens only), is one component of a comprehensive MRSA colonization surveillance program. It is not intended to diagnose MRSA infection nor to guide or monitor treatment for MRSA infections.   Culture, blood (routine x 2) Call MD if unable to obtain prior to antibiotics being given      Status: None (Preliminary result)   Collection Time: 12/28/15 10:14 AM  Result Value Ref Range Status   Specimen Description BLOOD RIGHT ANTECUBITAL  Final   Special Requests BOTTLES DRAWN AEROBIC ONLY  Final   Culture   Final    NO GROWTH < 24 HOURS Performed at [facility]    Report Status PENDING  Incomplete  Culture, blood (routine x 2) Call MD if unable to obtain prior to antibiotics being given     Status: None (Preliminary result)   Collection Time: 12/28/15 12:10 PM  Result Value Ref Range Status   Specimen Description BLOOD RIGHT ANTECUBITAL  Final   Special Requests BOTTLES DRAWN AEROBIC AND ANAEROBIC  Final   Culture   Final    NO GROWTH < 24 HOURS Performed at [facility]    Report Status PENDING  Incomplete     Studies: No results found.  Scheduled Meds: . albuterol  2.5 mg Nebulization Q6H  . apixaban  5 mg Oral BID  . arformoterol  15 mcg Nebulization BID  . ceFEPime (MAXIPIME) IV  1 g Intravenous Q8H  . diltiazem  480 mg Oral Daily  . famotidine  20 mg Oral QHS  . feeding supplement (GLUCERNA SHAKE)  237 mL Oral TID BM  . furosemide  40 mg Oral Daily  . gabapentin  100 mg Oral QHS  . glipiZIDE  5 mg Oral QAC breakfast  . guaiFENesin  600 mg Oral BID  . insulin aspart  0-15 Units Subcutaneous TID WC  . insulin aspart  0-5 Units Subcutaneous QHS  . insulin aspart  3 Units Subcutaneous TID WC  . insulin detemir  5 Units Subcutaneous QHS  . loratadine  10 mg Oral Daily  . methylPREDNISolone (SOLU-MEDROL) injection  60 mg Intravenous Q6H  . pantoprazole  40 mg Oral Daily  . senna-docusate  2 tablet Oral BID  . tiotropium  18 mcg Inhalation Daily  . vancomycin  500 mg Intravenous Q12H   Continuous Infusions:    Active Problems:   COPD exacerbation (HCC)   Acute on chronic respiratory failure with hypoxia (HCC)   Chronic atrial fibrillation (HCC)   DNR (do not resuscitate)   Leukocytosis   Type II diabetes mellitus (HCC)    Acute respiratory failure with hypoxia (HCC)    Time spent: 25 minutes.     Hartley Barefoot A  [facility] Pager [phone]. If 7PM-7AM, please contact night-coverage at [email or web address], password [facility] 12/30/2015, 1:17 PM  LOS: 2 days

## 2015-12-30 NOTE — Consult Note (Signed)
   [facility] CM Inpatient Consult   12/30/2015  Harlow Asadna G Jacobson August 18, 1941 [id number]   Patient screened for [facility] Care Management program. Chart reviewed. According to chart notes, patient went [facility] after last hospitalization. Noted palliative medicine consult pending during this hospitalization. Will not engage patient at this time as it appears the discharge plan likely for home hospice again. Call made to inpatient RNCM to discuss and to make that writer will not engage patient for potential [facility] services at this time. Will follow up if disposition plans change.    Raiford NobleAtika Hall, MSN-Ed, RN,BSN [facility] Care [facility] Hospital Liaison [phone]

## 2015-12-30 NOTE — Care Management Note (Signed)
Case Management Note  Patient Details  Name: Brenda Wells MRN: [id number] Date of Birth: 1941-06-27  Subjective/Objective:          75 yo admitted with COPD exacerbation          Action/Plan: From home with son.   Expected Discharge Date:   (unknown)               Expected Discharge Plan:  Home w Hospice Care  In-House Referral:     Discharge planning Services  CM Consult  Post Acute Care Choice:  Hospice Choice offered to:     DME Arranged:    DME Agency:     HH Arranged:  Disease Management [place] Agency:  [facility]  Status of Service:  In process, will continue to follow  Medicare Important Message Given:    Date Medicare IM Given:    Medicare IM give by:    Date Additional Medicare IM Given:    Additional Medicare Important Message give by:     If discussed at [place] of Stay Meetings, dates discussed:    Additional Comments: Pt was using the services of [facility] prior to admission.  This CM met with pt at bedside and pt states that she does not want to go home with [place] services and wants to choose a different hospice company.  This CM offered pt choice and she chose [facility].  Referral called to [facility] was called to inform them that pt would like to discontinue services.  Pt states she has all the equipment she needs at home. CM will continue to follow. Lynnell Catalan, RN 12/30/2015, 2:45 PM [phone]

## 2015-12-31 LAB — CBC
HEMATOCRIT: 30.5 % — AB (ref 36.0–46.0)
Hemoglobin: 9.6 g/dL — ABNORMAL LOW (ref 12.0–15.0)
MCH: 26.8 pg (ref 26.0–34.0)
MCHC: 31.5 g/dL (ref 30.0–36.0)
MCV: 85.2 fL (ref 78.0–100.0)
Platelets: 304 10*3/uL (ref 150–400)
RBC: 3.58 MIL/uL — ABNORMAL LOW (ref 3.87–5.11)
RDW: 17.6 % — AB (ref 11.5–15.5)
WBC: 27.6 10*3/uL — AB (ref 4.0–10.5)

## 2015-12-31 LAB — GLUCOSE, CAPILLARY
Glucose-Capillary: 212 mg/dL — ABNORMAL HIGH (ref 65–99)
Glucose-Capillary: 307 mg/dL — ABNORMAL HIGH (ref 65–99)
Glucose-Capillary: 274 mg/dL — ABNORMAL HIGH (ref 65–99)
Glucose-Capillary: 233 mg/dL — ABNORMAL HIGH (ref 65–99)

## 2015-12-31 MED ORDER — METHYLPREDNISOLONE SODIUM SUCC 40 MG IJ SOLR
40.0000 mg | Freq: Four times a day (QID) | INTRAMUSCULAR | Status: DC
  Administered 2015-12-31 – 2016-01-03 (×11): 40 mg via INTRAVENOUS
  Filled 2015-12-31 (×11): qty 1

## 2015-12-31 NOTE — Care Management Important Message (Signed)
Important Message  Patient Details  Name: Brenda Wells MRN: [id number] Date of Birth: 08/30/1941   Medicare Important Message Given:  Yes    Haskell FlirtJamison, Ameena Vesey 12/31/2015, 9:06 AMImportant Message  Patient Details  Name: Brenda Wells MRN: [id number] Date of Birth: 08/30/1941   Medicare Important Message Given:  Yes    Haskell FlirtJamison, Shanti Eichel 12/31/2015, 9:06 AM

## 2015-12-31 NOTE — Progress Notes (Signed)
Inpatient Diabetes Program Recommendations  AACE/ADA: New Consensus Statement on Inpatient Glycemic Control (2015)  Target Ranges:  Prepandial:   less than 140 mg/dL      Peak postprandial:   less than 180 mg/dL (1-2 hours)      Critically ill patients:  140 - 180 mg/dL   Results for Harlow AsaKENNEDY, Yadira G (MRN [id number]) as of 12/31/2015 09:18  Ref. Range 12/30/2015 07:46 12/30/2015 11:55 12/30/2015 16:36 12/30/2015 21:14  Glucose-Capillary Latest Ref Range: 65-99 mg/dL 409177 (H) 811246 (H) 914291 (H) 208 (H)   Results for Harlow AsaKENNEDY, Portia G (MRN [id number]) as of 12/31/2015 09:18  Ref. Range 12/31/2015 08:12  Glucose-Capillary Latest Ref Range: 65-99 mg/dL 086212 (H)   Results for Harlow AsaKENNEDY, Kawana G (MRN [id number]) as of 12/31/2015 09:18  Ref. Range 12/28/2015 13:13  Hemoglobin A1C Latest Ref Range: 4.8-5.6 % 7.6 (H)    Home DM Meds: Glipizide 5 mg daily   Current Orders: Levemir 5 units QHS      Novolog Moderate Correction Scale/ SSI (0-15 units) TID AC + HS      Novolog 3 units tidwc      Glipizide 5 mg daily     MD- Note patient currently receiving IV Solumedrol 60 mg Q6 hours.  Having sustained glucose elevations.  Please consider the following in-hospital insulin adjustments while patient getting IV steroids:  1. Increase Levemir to 8 units QHS  2. Increase Novolog Meal Coverage to 6 units tid with meals    --Will follow patient during hospitalization--  Ambrose FinlandJeannine Johnston Kiyah Demartini RN, MSN, CDE Diabetes Coordinator Inpatient Glycemic Control Team Team Pager: [phone] (8a-5p)

## 2015-12-31 NOTE — Progress Notes (Signed)
Pharmacy Antibiotic Note  Brenda Wells is a 75 y.o. female with severe COPD, AFib, HFpEF admitted on 12/28/2015 with [facility]. [facility] for same; treated with vanc/cefepime > doxycycline at discharge.   Pharmacy has been consulted for vancomycin dosing; also ordered Zosyn x 1 >> cefepime per MD.  D4 Antibiotics, leukocytosis  Plan:   Continue Vancomycin 500 mg IV q12 hr; goal trough 15-20 mcg/mL ([facility] regimen with similar CrCl)  Measure vancomycin trough level tonight at [facility]  Cefepime dosing is appropriate for indication and renal function Follow clinical course, renal function, culture results as available Follow for de-escalation of antibiotics and LOT  Height: 5\' 5"  (165.1 cm) Weight: 151 lb 14.4 oz (68.9 kg) IBW/kg (Calculated) : 57  Temp (24hrs), Avg:97.7 F (36.5 C), Min:97.3 F (36.3 C), Max:98.2 F (36.8 C)   Recent Labs Lab 12/25/15 0610 12/26/15 0431 12/27/15 0542 12/28/15 0945 12/28/15 1018 12/29/15 0347 12/30/15 0357 12/31/15 1111  WBC 17.3*  --   --  32.5*  --  18.8* 26.3* 27.6*  CREATININE 1.02* 1.08* 1.01* 0.78  --  0.82 0.75  --   LATICACIDVEN  --   --   --   --  1.8  --   --   --     Estimated Creatinine Clearance: 60.2 mL/min (by C-G formula based on Cr of 0.75).    No Known Allergies  Antimicrobials this admission: vanc 4/11 >>  cefepime 4/11 >>   Dose adjustments this admission: ---  Microbiology results: 2 hr Hx neg in Epic 4/3 BCx: NGF 4/11 BCx: NGTD  Thank you for allowing pharmacy to be a part of this patient's care.  Haynes Hoehnolleen Jimi Giza, PharmD, BCPS 12/31/2015, 12:25 PM  Pager: [phone]

## 2015-12-31 NOTE — Progress Notes (Signed)
[facility] PROGRESS NOTE  MICHAILA KENNEY NWG:[id number] DOB: 04-29-1941 DOA: 12/28/2015 PCP: [name], MD  Assessment/Plan: Acute on chronic respiratory failure with hypoxia (HCC/ COPD exacerbation (HCC) in the setting of ongoing tobacco abuse. PNA Continue her on nasal cannula 3- L, continue inhalers. She relates she will elect to go home with hospice. Use morphine for dyspnea. IV antibiotics, nebulizer,  IV solumedrol.  CM consulted to arrange Home Hospice.  Smoking cessation counseling provided.  Will discontinue vancomycin.  Continue with cefepime.   Chronic atrial fibrillation (HCC) Controlled, continue Eluquis.  Leukocytosis: unclear question if there is a portion of her leukocytosis that is due to steroids, but it's even higher than when she was discharged.  Continue with IV antibiotics.  WBC fluctuates.    Type II diabetes mellitus (HCC): Continue her glipizide at home started on sliding scale insulin plus low-dose long-acting.   Elevation BUN;  Related to steroids. Less likely renal failure.   Code Status: DNR Family Communication: care discussed with patient  Disposition Plan: home when stable   Consultants:  Palliative care  Procedures:  none  Antibiotics:  Cefepime   Vancomycin   HPI/Subjective: Wheezing decreased, does not feels ready to go home today. Still with SOB   Objective: Filed Vitals:   12/30/15 2108 12/31/15 0500  BP: 121/56 133/86  Pulse: 77 98  Temp: 97.3 F (36.3 C) 97.7 F (36.5 C)  Resp: 20 20    Intake/Output Summary (Last 24 hours) at 12/31/15 1532 Last data filed at 12/31/15 0913  Gross per 24 hour  Intake   1200 ml  Output   2650 ml  Net  -1450 ml   Filed Weights   12/28/15 1300 12/29/15 0559  Weight: 67.7 kg (149 lb 4 oz) 68.9 kg (151 lb 14.4 oz)    Exam:   General:  NAD  Cardiovascular: S 1, S 2 RRR  Respiratory: BL wheezing.   Abdomen: BS present, soft, nt  Musculoskeletal: no  edema   Data Reviewed: Basic Metabolic Panel:  Recent Labs Lab 12/25/15 0610 12/26/15 0431 12/27/15 0542 12/28/15 0945 12/29/15 0347 12/30/15 0357  NA 139 139 137 143 138 139  K 4.2 3.8 4.3 4.2 4.9 3.9  CL 90* 88* 86* 93* 91* 91*  CO2 37* 37* 38* 39* 34* 36*  GLUCOSE 216* 246* 201* 129* 303* 227*  BUN 35* 33* 40* 38* 32* 35*  CREATININE 1.02* 1.08* 1.01* 0.78 0.82 0.75  CALCIUM 8.6* 8.6* 8.6* 8.7* 8.8* 8.8*  MG 2.2 1.9 2.1  --   --   --    Liver Function Tests:  Recent Labs Lab 12/28/15 0945 12/29/15 0347  AST 20 24  ALT 23 26  ALKPHOS 50 47  BILITOT 0.2* 0.2*  PROT 5.9* 5.5*  ALBUMIN 3.4* 3.1*   No results for input(s): LIPASE, AMYLASE in the last 168 hours. No results for input(s): AMMONIA in the last 168 hours. CBC:  Recent Labs Lab 12/25/15 0610 12/28/15 0945 12/29/15 0347 12/30/15 0357 12/31/15 1111  WBC 17.3* 32.5* 18.8* 26.3* 27.6*  NEUTROABS  --  25.7*  --   --   --   HGB 9.6* 10.5* 9.0* 9.0* 9.6*  HCT 32.7* 34.3* 28.9* 28.8* 30.5*  MCV 87.2 88.2 85.8 87.0 85.2  PLT 326 368 286 307 304   Cardiac Enzymes:  Recent Labs Lab 12/28/15 0945  TROPONINI 0.03   BNP (last 3 results)  Recent Labs  11/21/15 0402 12/20/15 0448 12/28/15 1009  BNP 284.2* 68.6 102.6*  ProBNP (last 3 results) No results for input(s): PROBNP in the last 8760 hours.  CBG:  Recent Labs Lab 12/30/15 1155 12/30/15 1636 12/30/15 2114 12/31/15 0812 12/31/15 1238  GLUCAP 246* 291* 208* 212* 307*    Recent Results (from the past 240 hour(s))  Culture, blood (routine x 2) Call MD if unable to obtain prior to antibiotics being given     Status: None (Preliminary result)   Collection Time: 12/28/15 10:14 AM  Result Value Ref Range Status   Specimen Description BLOOD RIGHT ANTECUBITAL  Final   Special Requests BOTTLES DRAWN AEROBIC ONLY 6ML  Final   Culture   Final    NO GROWTH 2 DAYS Performed [facility]    Report Status PENDING  Incomplete   Culture, blood (routine x 2) Call MD if unable to obtain prior to antibiotics being given     Status: None (Preliminary result)   Collection Time: 12/28/15 12:10 PM  Result Value Ref Range Status   Specimen Description BLOOD RIGHT ANTECUBITAL  Final   Special Requests BOTTLES DRAWN AEROBIC AND ANAEROBIC 5ML  Final   Culture   Final    NO GROWTH 2 DAYS Performed at [facility]    Report Status PENDING  Incomplete     Studies: No results found.  Scheduled Meds: . albuterol  2.5 mg Nebulization QID  . apixaban  5 mg Oral BID  . arformoterol  15 mcg Nebulization BID  . ceFEPime (MAXIPIME) IV  1 g Intravenous Q8H  . diltiazem  480 mg Oral Daily  . famotidine  20 mg Oral QHS  . feeding supplement (GLUCERNA SHAKE)  237 mL Oral TID BM  . furosemide  40 mg Oral Daily  . gabapentin  100 mg Oral QHS  . glipiZIDE  5 mg Oral QAC breakfast  . guaiFENesin  600 mg Oral BID  . insulin aspart  0-15 Units Subcutaneous TID WC  . insulin aspart  0-5 Units Subcutaneous QHS  . insulin aspart  3 Units Subcutaneous TID WC  . insulin detemir  5 Units Subcutaneous QHS  . loratadine  10 mg Oral Daily  . methylPREDNISolone (SOLU-MEDROL) injection  60 mg Intravenous Q6H  . pantoprazole  40 mg Oral Daily  . senna-docusate  2 tablet Oral BID  . tiotropium  18 mcg Inhalation Daily  . vancomycin  500 mg Intravenous Q12H   Continuous Infusions:    Active Problems:   COPD exacerbation (HCC)   Acute on chronic respiratory failure with hypoxia (HCC)   Chronic atrial fibrillation (HCC)   DNR (do not resuscitate)   Leukocytosis   Type II diabetes mellitus (HCC)   Acute respiratory failure with hypoxia (HCC)    Time spent: 25 minutes.     Hartley Barefootegalado, Belkys A  [facility] Pager [phone]. If 7PM-7AM, please contact night-coverage at [email or web address], password [email or web address] 12/31/2015, 3:32 PM  LOS: 3 days

## 2015-12-31 NOTE — Consult Note (Signed)
Consultation Note Date: 12/31/2015   Patient Name: Brenda Wells  DOB: 1940-10-19  MRN: [id number]  Age / Sex: 75 y.o., female  PCP: Maryella Shivers, MD Referring Physician: Elmarie Shiley, MD  Reason for Consultation: Establishing goals of care  35 yof with end stage COPD, afib with RVR on coumadin, CHF, recent spontaneous pneumothorax (11/2015) that required chest tube placement.  Admitted 4/3 with HCAP.  Discharged home but was readmitted within 48 hours.  She reports that her hospice agency told her son that they were not going to come and see her anymore and to bring her to the hospital.   Clinical Assessment/Narrative: I met with Brenda Wells and she reports the most important things are feeling as well as possible for as long as possible and remain in her own home.  She reports that her doctors and been doing a good job explaining things to her and she understands the fact that she is approaching the end of her life. She has been at home with hospice support and wants to continue with this, although she is unhappy with her current hospice provider.  Lives with her son and his girlfriend, and she states that [facility] and Erasmo Downer take excellent care of her.  They usually do "too much for me, really."    Contacts/Participants in Discussion: Patient Primary Decision Maker: patient   SUMMARY OF RECOMMENDATIONS DNR Continue Current medications. Addition of roxanol as needed for SOB CM working on options for home hospice.  She would like to change agencies on discharge.  Code Status/Advance Care Planning: Limited code    Code Status Orders        Start     Ordered   12/24/15 1255  Limited resuscitation (code)   Continuous    Question Answer Comment  In the event of cardiac or respiratory ARREST: Initiate Code Blue, Call Rapid Response Yes   In the event of cardiac or respiratory ARREST: Perform CPR No   In  the event of cardiac or respiratory ARREST: Perform Intubation/Mechanical Ventilation No   In the event of cardiac or respiratory ARREST: Use NIPPV/BiPAp only if indicated Yes   In the event of cardiac or respiratory ARREST: Administer ACLS medications if indicated No   In the event of cardiac or respiratory ARREST: Perform Defibrillation or Cardioversion if indicated No      12/24/15 1254        Additional Recommendations (Limitations, Scope, Preferences):  Avoid Hospitalization, No Artificial Feeding and No Tracheostomy  Psycho-social/Spiritual:  Support System: Uncertain Desire for further Chaplaincy support:no Additional Recommendations: Caregiving  Support/Resources  Prognosis: < 3 months  Discharge Planning: Home with Hospice (would like agency other than [facility])   Chief Complaint/ Primary Diagnoses: Present on Admission:  . COPD exacerbation ([place]) . Acute on chronic respiratory failure with hypoxia ([place]) . Chronic atrial fibrillation ([place]) . DNR (do not resuscitate) . Leukocytosis . Acute respiratory failure with hypoxia ([place])  I have reviewed the medical record, interviewed the patient and family, and examined the patient. The following aspects are pertinent.  Past Medical History  Diagnosis Date  . Acute respiratory failure ([place])   . Community acquired pneumonia   . COPD with acute exacerbation ([place])   . Anemia   . Hypokalemia   . Hyponatremia   . CHF (congestive heart failure) ([place])   . Hypertension   . Shortness of breath dyspnea   . History of kidney stones   . Atrial fibrillation [facility])    Social  History   Social History  . Marital Status: Single    Spouse Name: N/A  . Number of Children: N/A  . Years of Education: N/A   Social History Main Topics  . Smoking status: Current Every Day Smoker -- 0.10 packs/day for 60 years    Types: Cigarettes  . Smokeless tobacco: Never Used     Comment: I smoke 1 cigarette a day "  . Alcohol Use: No  . Drug  Use: No  . Sexual Activity: No   Other Topics Concern  . None   Social History Narrative   Family History  Problem Relation Age of Onset  . Diabetes Mellitus II Mother   . CAD Mother   . CAD Brother    Scheduled Meds: . albuterol  2.5 mg Nebulization QID  . apixaban  5 mg Oral BID  . arformoterol  15 mcg Nebulization BID  . ceFEPime (MAXIPIME) IV  1 g Intravenous Q8H  . diltiazem  480 mg Oral Daily  . famotidine  20 mg Oral QHS  . feeding supplement (GLUCERNA SHAKE)  237 mL Oral TID BM  . furosemide  40 mg Oral Daily  . gabapentin  100 mg Oral QHS  . glipiZIDE  5 mg Oral QAC breakfast  . guaiFENesin  600 mg Oral BID  . insulin aspart  0-15 Units Subcutaneous TID WC  . insulin aspart  0-5 Units Subcutaneous QHS  . insulin aspart  3 Units Subcutaneous TID WC  . insulin detemir  5 Units Subcutaneous QHS  . loratadine  10 mg Oral Daily  . methylPREDNISolone (SOLU-MEDROL) injection  60 mg Intravenous Q6H  . pantoprazole  40 mg Oral Daily  . senna-docusate  2 tablet Oral BID  . tiotropium  18 mcg Inhalation Daily  . vancomycin  500 mg Intravenous Q12H   Continuous Infusions:  PRN Meds:.acetaminophen **OR** acetaminophen, albuterol, morphine injection, morphine CONCENTRATE, ondansetron **OR** ondansetron (ZOFRAN) IV, ondansetron, polyethylene glycol, zolpidem Medications Prior to Admission:  Prior to Admission medications   Medication Sig Start Date End Date Taking? Authorizing Provider  albuterol (PROVENTIL) (2.5 MG/3ML) 0.083% nebulizer solution Take 3 mLs (2.5 mg total) by nebulization every 2 (two) hours as needed for wheezing or shortness of breath. 09/07/15  Yes Richarda Overlie, MD  apixaban (ELIQUIS) 5 MG TABS tablet Take 1 tablet (5 mg total) by mouth 2 (two) times daily. 10/23/15  Yes Ripudeep Jenna Luo, MD  arformoterol (BROVANA) 15 MCG/2ML NEBU Take 2 mLs (15 mcg total) by nebulization 2 (two) times daily. 09/07/15  Yes Richarda Overlie, MD  diltiazem (CARDIZEM CD) 360 MG 24 hr  capsule Take 1 capsule (360 mg total) by mouth daily. 11/25/15  Yes Shanker Levora Dredge, MD  feeding supplement, GLUCERNA SHAKE, (GLUCERNA SHAKE) LIQD Take 237 mLs by mouth 3 (three) times daily between meals. 11/25/15  Yes Shanker Levora Dredge, MD  Fluticasone-Salmeterol (ADVAIR) 500-50 MCG/DOSE AEPB Inhale 1 puff into the lungs 2 (two) times daily.   Yes Historical Provider, MD  furosemide (LASIX) 40 MG tablet Take 1 tablet (40 mg total) by mouth daily. 10/04/15  Yes Maryann Mikhail, DO  glipiZIDE (GLUCOTROL) 5 MG tablet Take 1 tablet (5 mg total) by mouth daily before breakfast. 11/25/15  Yes Shanker Levora Dredge, MD  guaiFENesin (MUCINEX) 600 MG 12 hr tablet Take 1 tablet (600 mg total) by mouth 2 (two) times daily. 10/23/15  Yes Ripudeep K Rai, MD  ipratropium-albuterol (DUONEB) 0.5-2.5 (3) MG/3ML SOLN Take 3 mLs by nebulization 3 (three)  times daily. 10/23/15  Yes Ripudeep Krystal Eaton, MD  loratadine (CLARITIN) 10 MG tablet Take 10 mg by mouth daily.   Yes Historical Provider, MD  omeprazole (PRILOSEC) 20 MG capsule Take 20 mg by mouth daily.   Yes Historical Provider, MD  ondansetron (ZOFRAN) 4 MG tablet Take 4 mg by mouth every 6 (six) hours as needed for nausea or vomiting.  11/14/15  Yes Historical Provider, MD  oxyCODONE (OXY IR/ROXICODONE) 5 MG immediate release tablet Take 1 tablet (5 mg total) by mouth every 4 (four) hours as needed for moderate pain (dyspnea, SOB). 11/25/15  Yes Shanker Kristeen Mans, MD  sennosides-docusate sodium (SENOKOT-S) 8.6-50 MG tablet Take 2 tablets by mouth 2 (two) times daily.   Yes Historical Provider, MD  SPIRIVA HANDIHALER 18 MCG inhalation capsule Place 18 mcg into inhaler and inhale daily.  08/24/15  Yes Historical Provider, MD  zolpidem (AMBIEN) 5 MG tablet Take 1 tablet (5 mg total) by mouth at bedtime as needed for sleep. 11/25/15  Yes Shanker Kristeen Mans, MD   No Known Allergies  Review of Systems:  Eating well, unable to walk - but can transfer although she says this is very hard for  her to do, no dysuria or bowel changes, denies abdominal pain  Physical Exam  Constitutional: She is oriented to person, place, and time. She appears well-developed and well-nourished.  Elderly, pleasant female  Respiratory:  Increased work of breathing.  SOB when speaking.  GI: Soft.  Neurological: She is alert and oriented to person, place, and time.  Skin:  Thin skin, multiple bruises on each extremity.  Psychiatric: She has a normal mood and affect. Her behavior is normal. Judgment and thought content normal.    Vital Signs: BP 133/86 mmHg  Pulse 98  Temp(Src) 97.7 F (36.5 C) (Oral)  Resp 20  Ht _0  (1.651 m)  Wt 68.9 kg (151 lb 14.4 oz)  BMI 25.28 kg/m2  SpO2 98%  SpO2: SpO2: 98 % O2 Device:SpO2: 98 % O2 Flow Rate: .O2 Flow Rate (L/min): 3 L/min  IO: Intake/output summary:   Intake/Output Summary (Last 24 hours) at 12/31/15 0938 Last data filed at 12/31/15 0913  Gross per 24 hour  Intake   1680 ml  Output   3100 ml  Net  -1420 ml    LBM: Last BM Date: 12/29/15 Baseline Weight: Weight: 67.7 kg (149 lb 4 oz) Most recent weight: Weight: 68.9 kg (151 lb 14.4 oz)      Palliative Assessment/Data:    Additional Data Reviewed:  CBC:    Component Value Date/Time   WBC 26.3* 12/30/2015 0357   HGB 9.0* 12/30/2015 0357   HCT 28.8* 12/30/2015 0357   PLT 307 12/30/2015 0357   MCV 87.0 12/30/2015 0357   NEUTROABS 25.7* 12/28/2015 0945   LYMPHSABS 4.2* 12/28/2015 0945   MONOABS 2.6* 12/28/2015 0945   EOSABS 0.0 12/28/2015 0945   BASOSABS 0.0 12/28/2015 0945   Comprehensive Metabolic Panel:    Component Value Date/Time   NA 139 12/30/2015 0357   K 3.9 12/30/2015 0357   CL 91* 12/30/2015 0357   CO2 36* 12/30/2015 0357   BUN 35* 12/30/2015 0357   CREATININE 0.75 12/30/2015 0357   GLUCOSE 227* 12/30/2015 0357   CALCIUM 8.8* 12/30/2015 0357   AST 24 12/29/2015 0347   ALT 26 12/29/2015 0347   ALKPHOS 47 12/29/2015 0347   BILITOT 0.2* 12/29/2015 0347    PROT 5.5* 12/29/2015 0347   ALBUMIN 3.1* 12/29/2015 3016  Time In: 1340          Time Out: 1440 Time Total: 60   Greater than 50%  of this time was spent counseling and coordinating care related to the above assessment and plan.  Signed by:  Micheline Rough, MD  12/31/2015, 9:38 AM  Please contact Palliative Medicine Team phone at [phone] for questions and concerns.

## 2016-01-01 ENCOUNTER — Inpatient Hospital Stay (HOSPITAL_COMMUNITY): Payer: Medicare Other

## 2016-01-01 LAB — BASIC METABOLIC PANEL
ANION GAP: 17 — AB (ref 5–15)
BUN: 43 mg/dL — ABNORMAL HIGH (ref 6–20)
CHLORIDE: 89 mmol/L — AB (ref 101–111)
CO2: 34 mmol/L — AB (ref 22–32)
CREATININE: 1 mg/dL (ref 0.44–1.00)
Calcium: 8.7 mg/dL — ABNORMAL LOW (ref 8.9–10.3)
GFR calc non Af Amer: 54 mL/min — ABNORMAL LOW (ref >60)
Glucose, Bld: 295 mg/dL — ABNORMAL HIGH (ref 65–99)
POTASSIUM: 4.3 mmol/L (ref 3.5–5.1)
Sodium: 140 mmol/L (ref 135–145)

## 2016-01-01 LAB — CBC
HEMATOCRIT: 31.4 % — AB (ref 36.0–46.0)
HEMOGLOBIN: 9.7 g/dL — AB (ref 12.0–15.0)
MCH: 26.4 pg (ref 26.0–34.0)
MCHC: 30.9 g/dL (ref 30.0–36.0)
MCV: 85.3 fL (ref 78.0–100.0)
Platelets: 295 10*3/uL (ref 150–400)
RBC: 3.68 MIL/uL — AB (ref 3.87–5.11)
RDW: 17.7 % — ABNORMAL HIGH (ref 11.5–15.5)
WBC: 25.8 10*3/uL — ABNORMAL HIGH (ref 4.0–10.5)

## 2016-01-01 LAB — GLUCOSE, CAPILLARY
GLUCOSE-CAPILLARY: 242 mg/dL — AB (ref 65–99)
GLUCOSE-CAPILLARY: 229 mg/dL — AB (ref 65–99)
GLUCOSE-CAPILLARY: 253 mg/dL — AB (ref 65–99)
Glucose-Capillary: 234 mg/dL — ABNORMAL HIGH (ref 65–99)

## 2016-01-01 NOTE — Progress Notes (Signed)
[facility] PROGRESS NOTE  Brenda Wells WJX:[id number] DOB: 1941/07/20 DOA: 12/28/2015 PCP: [name],[name], MD  Assessment/Plan: Acute on chronic respiratory failure with hypoxia (HCC/ COPD exacerbation (HCC) in the setting of ongoing tobacco abuse. PNA Continue her on nasal cannula 3- L, continue inhalers. She relates she will elect to go home with hospice. Use morphine for dyspnea. IV antibiotics, nebulizer,  IV solumedrol.  CM consulted to arrange Home Hospice.  Smoking cessation counseling provided.  discontinue vancomycin 4-14 Continue with cefepime.  Repeat chest x ray   Chronic atrial fibrillation (HCC) Controlled, continue Eluquis.  Leukocytosis: unclear question if there is a portion of her leukocytosis that is due to steroids, but it's even higher than when she was discharged.  Continue with IV antibiotics.  WBC fluctuates.  Repeat labs this am  Type II diabetes mellitus (HCC): Continue her glipizide at home started on sliding scale insulin plus low-dose long-acting.   Elevation BUN;  Related to steroids. Less likely renal failure.   Code Status: DNR Family Communication: care discussed with patient  Disposition Plan: home when stable   Consultants:  Palliative care  Procedures:  none  Antibiotics:  Cefepime   Vancomycin   HPI/Subjective: Not feeling well, still with SOB   Objective: Filed Vitals:   12/31/15 2048 01/01/16 0525  BP: 126/69 116/97  Pulse: 83 97  Temp: 97.9 F (36.6 C) 97.9 F (36.6 C)  Resp: 18 20    Intake/Output Summary (Last 24 hours) at 01/01/16 0958 Last data filed at 01/01/16 0525  Gross per 24 hour  Intake    655 ml  Output    800 ml  Net   -145 ml   Filed Weights   12/28/15 1300 12/29/15 0559  Weight: 67.7 kg (149 lb 4 oz) 68.9 kg (151 lb 14.4 oz)    Exam:   General:  NAD  Cardiovascular: S 1, S 2 RRR  Respiratory: BL wheezing.   Abdomen: BS present, soft, nt  Musculoskeletal: no  edema   Data Reviewed: Basic Metabolic Panel:  Recent Labs Lab 12/26/15 0431 12/27/15 0542 12/28/15 0945 12/29/15 0347 12/30/15 0357  NA 139 137 143 138 139  K 3.8 4.3 4.2 4.9 3.9  CL 88* 86* 93* 91* 91*  CO2 37* 38* 39* 34* 36*  GLUCOSE 246* 201* 129* 303* 227*  BUN 33* 40* 38* 32* 35*  CREATININE 1.08* 1.01* 0.78 0.82 0.75  CALCIUM 8.6* 8.6* 8.7* 8.8* 8.8*  MG 1.9 2.1  --   --   --    Liver Function Tests:  Recent Labs Lab 12/28/15 0945 12/29/15 0347  AST 20 24  ALT 23 26  ALKPHOS 50 47  BILITOT 0.2* 0.2*  PROT 5.9* 5.5*  ALBUMIN 3.4* 3.1*   No results for input(s): LIPASE, AMYLASE in the last 168 hours. No results for input(s): AMMONIA in the last 168 hours. CBC:  Recent Labs Lab 12/28/15 0945 12/29/15 0347 12/30/15 0357 12/31/15 1111  WBC 32.5* 18.8* 26.3* 27.6*  NEUTROABS 25.7*  --   --   --   HGB 10.5* 9.0* 9.0* 9.6*  HCT 34.3* 28.9* 28.8* 30.5*  MCV 88.2 85.8 87.0 85.2  PLT 368 286 307 304   Cardiac Enzymes:  Recent Labs Lab 12/28/15 0945  TROPONINI 0.03   BNP (last 3 results)  Recent Labs  11/21/15 0402 12/20/15 0448 12/28/15 1009  BNP 284.2* 68.6 102.6*    ProBNP (last 3 results) No results for input(s): PROBNP in the last 8760 hours.  CBG:  Recent Labs Lab 12/31/15 0812 12/31/15 1238 12/31/15 1804 12/31/15 2046 01/01/16 0732  GLUCAP 212* 307* 274* 233* 242*    Recent Results (from the past 240 hour(s))  Culture, blood (routine x 2) Call MD if unable to obtain prior to antibiotics being given     Status: None (Preliminary result)   Collection Time: 12/28/15 10:14 AM  Result Value Ref Range Status   Specimen Description BLOOD RIGHT ANTECUBITAL  Final   Special Requests BOTTLES DRAWN AEROBIC ONLY  Final   Culture   Final    NO GROWTH 3 DAYS Performed at [facility]    Report Status PENDING  Incomplete  Culture, blood (routine x 2) Call MD if unable to obtain prior to antibiotics being given     Status:  None (Preliminary result)   Collection Time: 12/28/15 12:10 PM  Result Value Ref Range Status   Specimen Description BLOOD RIGHT ANTECUBITAL  Final   Special Requests BOTTLES DRAWN AEROBIC AND ANAEROBIC  Final   Culture   Final    NO GROWTH 3 DAYS Performed at [facility]    Report Status PENDING  Incomplete     Studies: No results found.  Scheduled Meds: . albuterol  2.5 mg Nebulization QID  . apixaban  5 mg Oral BID  . arformoterol  15 mcg Nebulization BID  . ceFEPime (MAXIPIME) IV  1 g Intravenous Q8H  . diltiazem  480 mg Oral Daily  . famotidine  20 mg Oral QHS  . feeding supplement (GLUCERNA SHAKE)  237 mL Oral TID BM  . furosemide  40 mg Oral Daily  . gabapentin  100 mg Oral QHS  . glipiZIDE  5 mg Oral QAC breakfast  . guaiFENesin  600 mg Oral BID  . insulin aspart  0-15 Units Subcutaneous TID WC  . insulin aspart  0-5 Units Subcutaneous QHS  . insulin aspart  3 Units Subcutaneous TID WC  . insulin detemir  5 Units Subcutaneous QHS  . loratadine  10 mg Oral Daily  . methylPREDNISolone (SOLU-MEDROL) injection  40 mg Intravenous Q6H  . pantoprazole  40 mg Oral Daily  . senna-docusate  2 tablet Oral BID  . tiotropium  18 mcg Inhalation Daily   Continuous Infusions:    Active Problems:   COPD exacerbation (HCC)   Acute on chronic respiratory failure with hypoxia (HCC)   Chronic atrial fibrillation (HCC)   DNR (do not resuscitate)   Leukocytosis   Type II diabetes mellitus (HCC)   Acute respiratory failure with hypoxia (HCC)    Time spent: 25 minutes.     Hartley Barefoot A  [facility] Pager [phone]. If 7PM-7AM, please contact night-coverage at [email or web address], password [facility] 01/01/2016, 9:58 AM  LOS: 4 days

## 2016-01-02 LAB — BASIC METABOLIC PANEL
Anion gap: 13 (ref 5–15)
BUN: 43 mg/dL — ABNORMAL HIGH (ref 6–20)
CO2: 34 mmol/L — AB (ref 22–32)
CREATININE: 0.8 mg/dL (ref 0.44–1.00)
Calcium: 8.4 mg/dL — ABNORMAL LOW (ref 8.9–10.3)
Chloride: 91 mmol/L — ABNORMAL LOW (ref 101–111)
GFR calc non Af Amer: >60 mL/min (ref >60)
Glucose, Bld: 264 mg/dL — ABNORMAL HIGH (ref 65–99)
Potassium: 4 mmol/L (ref 3.5–5.1)
SODIUM: 138 mmol/L (ref 135–145)

## 2016-01-02 LAB — CULTURE, BLOOD (ROUTINE X 2)
CULTURE: NO GROWTH
Culture: NO GROWTH

## 2016-01-02 LAB — GLUCOSE, CAPILLARY
GLUCOSE-CAPILLARY: 196 mg/dL — AB (ref 65–99)
GLUCOSE-CAPILLARY: 234 mg/dL — AB (ref 65–99)
GLUCOSE-CAPILLARY: 242 mg/dL — AB (ref 65–99)
Glucose-Capillary: 215 mg/dL — ABNORMAL HIGH (ref 65–99)

## 2016-01-02 MED ORDER — FUROSEMIDE 40 MG PO TABS
40.0000 mg | ORAL_TABLET | Freq: Every day | ORAL | Status: DC
  Administered 2016-01-03 – 2016-01-05 (×3): 40 mg via ORAL
  Filled 2016-01-02 (×3): qty 1

## 2016-01-02 MED ORDER — DEXTROSE 5 % IV SOLN
1.0000 g | Freq: Two times a day (BID) | INTRAVENOUS | Status: DC
  Administered 2016-01-02 – 2016-01-03 (×3): 1 g via INTRAVENOUS
  Filled 2016-01-02 (×5): qty 1

## 2016-01-02 MED ORDER — FUROSEMIDE 20 MG PO TABS
20.0000 mg | ORAL_TABLET | Freq: Every day | ORAL | Status: DC
  Administered 2016-01-02: 20 mg via ORAL
  Filled 2016-01-02: qty 1

## 2016-01-02 MED ORDER — FUROSEMIDE 10 MG/ML IJ SOLN: 20.0000 mg | Freq: Once | INTRAMUSCULAR | Status: DC

## 2016-01-02 MED ORDER — INSULIN DETEMIR 100 UNIT/ML ~~LOC~~ SOLN
10.0000 [IU] | Freq: Every day | SUBCUTANEOUS | Status: DC
  Administered 2016-01-02: 10 [IU] via SUBCUTANEOUS
  Filled 2016-01-02: qty 0.1

## 2016-01-02 NOTE — Progress Notes (Signed)
Pharmacy Antibiotic Note  Brenda Wells is a 75 y.o. female with severe COPD, AFib, HFpEF admitted on 12/28/2015 with HCAP. [facility] for same; treated with vanc/cefepime > doxycycline at discharge.   Pharmacy has been consulted for vancomycin dosing; also ordered Zosyn x 1 >> cefepime per MD.  D6/8 Antibiotics, leukocytosis  Plan:  For increased SCr, will adjust cefepime from 1g q8 to 1g q12  Height: 5\' 5"  (165.1 cm) Weight: 151 lb 14.4 oz (68.9 kg) IBW/kg (Calculated) : 57  Temp (24hrs), Avg:98 F (36.7 C), Min:98 F (36.7 C), Max:98.1 F (36.7 C)   Recent Labs Lab 12/27/15 0542 12/28/15 0945 12/28/15 1018 12/29/15 0347 12/30/15 0357 12/31/15 1111 01/01/16 1035  WBC  --  32.5*  --  18.8* 26.3* 27.6* 25.8*  CREATININE 1.01* 0.78  --  0.82 0.75  --  1.00  LATICACIDVEN  --   --  1.8  --   --   --   --     Estimated Creatinine Clearance: 48.2 mL/min (by C-G formula based on Cr of 1).    No Known Allergies  Antimicrobials this admission: vanc 4/11 >> 4/14 cefepime 4/11 >>   Dose adjustments this admission: ---  Microbiology results: 2 hr Hx neg in Epic 4/3 BCx: NGF 4/11 BCx: NGTD   Thank you for allowing pharmacy to be a part of this patient's care.   Hessie KnowsJustin M Crystalina Stodghill, PharmD, BCPS Pager [phone] 01/02/2016 11:28 AM

## 2016-01-02 NOTE — Progress Notes (Signed)
[facility] PROGRESS NOTE  Brenda Wells JKD:[id number] DOB: [birth year] DOA: 12/28/2015 PCP: Maryella Shivers, MD  Assessment/Plan: Acute on chronic respiratory failure with hypoxia (HCC/ COPD exacerbation ([place]) in the setting of ongoing tobacco abuse. PNA Continue her on nasal cannula 3- L, continue inhalers. She relates she will elect to go home with hospice. Use morphine for dyspnea. IV antibiotics, nebulizer,  IV solumedrol.  CM consulted to arrange Home Hospice.  Smoking cessation counseling provided.  discontinue vancomycin 4-14 Continue with cefepime.  Repeated chest x ray ; mild increase size pleural effusion.  Will give one extra dose of lasix. More hypoxic today.   Chronic atrial fibrillation (HCC) Controlled, continue Eluquis.  Leukocytosis: unclear question if there is a portion of her leukocytosis that is due to steroids, but it's even higher than when she was discharged.  Continue with IV antibiotics.  WBC fluctuates.   AKI; cr mildly increase to 1. Monitor on lasix. Will give one extra dose of lasix due to worsening hypoxia.  Repeat b-met today   Type II diabetes mellitus ([place]): Continue her glipizide at home started on sliding scale insulin plus low-dose long-acting.    Code Status: DNR Family Communication: care discussed with patient  Disposition Plan: home when stable   Consultants:  Palliative care  Procedures:  none  Antibiotics:  Cefepime   Vancomycin   HPI/Subjective: Feeling same as yesterday, more hypoxic.  Still with SOB.    Objective: Filed Vitals:   01/02/16 0730 01/02/16 1250  BP:  144/65  Pulse:  98  Temp:  97.6 F (36.4 C)  Resp: 20 20    Intake/Output Summary (Last 24 hours) at 01/02/16 1430 Last data filed at 01/02/16 1100  Gross per 24 hour  Intake    990 ml  Output   1200 ml  Net   -210 ml   Filed Weights   12/28/15 1300 12/29/15 0559  Weight: 67.7 kg (149 lb 4 oz) 68.9 kg (151 lb 14.4 oz)     Exam:   General:  NAD  Cardiovascular: S 1, S 2 RRR  Respiratory: BL wheezing.   Abdomen: BS present, soft, nt  Musculoskeletal: no edema   Data Reviewed: Basic Metabolic Panel:  Recent Labs Lab 12/27/15 0542 12/28/15 0945 12/29/15 0347 12/30/15 0357 01/01/16 1035  NA 137 143 138 139 140  K 4.3 4.2 4.9 3.9 4.3  CL 86* 93* 91* 91* 89*  CO2 38* 39* 34* 36* 34*  GLUCOSE 201* 129* 303* 227* 295*  BUN 40* 38* 32* 35* 43*  CREATININE 1.01* 0.78 0.82 0.75 1.00  CALCIUM 8.6* 8.7* 8.8* 8.8* 8.7*  MG 2.1  --   --   --   --    Liver Function Tests:  Recent Labs Lab 12/28/15 0945 12/29/15 0347  AST 20 24  ALT 23 26  ALKPHOS 50 47  BILITOT 0.2* 0.2*  PROT 5.9* 5.5*  ALBUMIN 3.4* 3.1*   No results for input(s): LIPASE, AMYLASE in the last 168 hours. No results for input(s): AMMONIA in the last 168 hours. CBC:  Recent Labs Lab 12/28/15 0945 12/29/15 0347 12/30/15 0357 12/31/15 1111 01/01/16 1035  WBC 32.5* 18.8* 26.3* 27.6* 25.8*  NEUTROABS 25.7*  --   --   --   --   HGB 10.5* 9.0* 9.0* 9.6* 9.7*  HCT 34.3* 28.9* 28.8* 30.5* 31.4*  MCV 88.2 85.8 87.0 85.2 85.3  PLT 368 286 307 304 295   Cardiac Enzymes:  Recent Labs Lab 12/28/15 0945  TROPONINI 0.03   BNP (last 3 results)  Recent Labs  11/21/15 0402 12/20/15 0448 12/28/15 1009  BNP 284.2* 68.6 102.6*    ProBNP (last 3 results) No results for input(s): PROBNP in the last 8760 hours.  CBG:  Recent Labs Lab 01/01/16 1134 01/01/16 1612 01/01/16 2159 01/02/16 0728 01/02/16 1144  GLUCAP 234* 229* 253* 196* 215*    Recent Results (from the past 240 hour(s))  Culture, blood (routine x 2) Call MD if unable to obtain prior to antibiotics being given     Status: None (Preliminary result)   Collection Time: 12/28/15 10:14 AM  Result Value Ref Range Status   Specimen Description BLOOD RIGHT ANTECUBITAL  Final   Special Requests BOTTLES DRAWN AEROBIC ONLY 6ML  Final   Culture   Final     NO GROWTH 4 DAYS Performed at [facility]    Report Status PENDING  Incomplete  Culture, blood (routine x 2) Call MD if unable to obtain prior to antibiotics being given     Status: None (Preliminary result)   Collection Time: 12/28/15 12:10 PM  Result Value Ref Range Status   Specimen Description BLOOD RIGHT ANTECUBITAL  Final   Special Requests BOTTLES DRAWN AEROBIC AND ANAEROBIC 5ML  Final   Culture   Final    NO GROWTH 4 DAYS Performed at [facility]    Report Status PENDING  Incomplete     Studies: Dg Chest 2 View  01/01/2016  CLINICAL DATA:  75 year old female with shortness breath and decreased oxygen saturation. Dyspnea. History of COPD. Acute respiratory failure. Community acquired pneumonia. EXAM: CHEST  2 VIEW COMPARISON:  Chest x-ray 12/28/2015. FINDINGS: Small left pleural effusion increased compared to the prior study. Opacity the base of the left hemithorax favored to reflect predominantly subsegmental atelectasis, although underlying airspace consolidation is not entirely excluded. Right lung is clear. No right pleural effusion. No pneumothorax. No definite suspicious appearing pulmonary nodules or masses are noted. Mild diffuse peribronchial cuffing. No evidence of pulmonary edema. Heart size is mildly enlarged. Upper mediastinal contours are within normal limits. Atherosclerosis in the thoracic aorta. IMPRESSION: 1. Slight increase in small left pleural effusion. 2. Opacity at the left lung base, most compatible with atelectasis/scarring in the inferior segment of the lingula as demonstrated on prior CT examination 09/29/2015. 3. Mild cardiomegaly. 4. Atherosclerosis. Electronically Signed   By: Vinnie Langton M.D.   On: 01/01/2016 13:20    Scheduled Meds: . albuterol  2.5 mg Nebulization QID  . apixaban  5 mg Oral BID  . arformoterol  15 mcg Nebulization BID  . ceFEPime (MAXIPIME) IV  1 g Intravenous Q12H  . diltiazem  480 mg Oral Daily  . famotidine   20 mg Oral QHS  . feeding supplement (GLUCERNA SHAKE)  237 mL Oral TID BM  . [START ON 01/03/2016] furosemide  40 mg Oral Daily  . gabapentin  100 mg Oral QHS  . glipiZIDE  5 mg Oral QAC breakfast  . guaiFENesin  600 mg Oral BID  . insulin aspart  0-15 Units Subcutaneous TID WC  . insulin aspart  0-5 Units Subcutaneous QHS  . insulin aspart  3 Units Subcutaneous TID WC  . insulin detemir  5 Units Subcutaneous QHS  . loratadine  10 mg Oral Daily  . methylPREDNISolone (SOLU-MEDROL) injection  40 mg Intravenous Q6H  . pantoprazole  40 mg Oral Daily  . senna-docusate  2 tablet Oral BID  . tiotropium  18 mcg Inhalation  Daily   Continuous Infusions:    Active Problems:   COPD exacerbation (HCC)   Acute on chronic respiratory failure with hypoxia (HCC)   Chronic atrial fibrillation (HCC)   DNR (do not resuscitate)   Leukocytosis   Type II diabetes mellitus ([place])   Acute respiratory failure with hypoxia ([place])    Time spent: 25 minutes.     Niel Hummer A  [facility] Pager [phone]. If 7PM-7AM, please contact night-coverage at [email or web address], password [email or web address] 01/02/2016, 2:30 PM  LOS: 5 days

## 2016-01-03 LAB — BASIC METABOLIC PANEL
Anion gap: 12 (ref 5–15)
BUN: 48 mg/dL — AB (ref 6–20)
CALCIUM: 8.4 mg/dL — AB (ref 8.9–10.3)
CO2: 34 mmol/L — AB (ref 22–32)
Chloride: 90 mmol/L — ABNORMAL LOW (ref 101–111)
Creatinine, Ser: 0.88 mg/dL (ref 0.44–1.00)
GFR calc Af Amer: >60 mL/min (ref >60)
GLUCOSE: 293 mg/dL — AB (ref 65–99)
POTASSIUM: 4.3 mmol/L (ref 3.5–5.1)
Sodium: 136 mmol/L (ref 135–145)

## 2016-01-03 LAB — GLUCOSE, CAPILLARY
GLUCOSE-CAPILLARY: 266 mg/dL — AB (ref 65–99)
Glucose-Capillary: 213 mg/dL — ABNORMAL HIGH (ref 65–99)
Glucose-Capillary: 226 mg/dL — ABNORMAL HIGH (ref 65–99)
Glucose-Capillary: 268 mg/dL — ABNORMAL HIGH (ref 65–99)

## 2016-01-03 MED ORDER — INSULIN DETEMIR 100 UNIT/ML ~~LOC~~ SOLN
12.0000 [IU] | Freq: Every day | SUBCUTANEOUS | Status: DC
  Administered 2016-01-03 – 2016-01-04 (×2): 12 [IU] via SUBCUTANEOUS
  Filled 2016-01-03 (×3): qty 0.12

## 2016-01-03 MED ORDER — METHYLPREDNISOLONE SODIUM SUCC 40 MG IJ SOLR
40.0000 mg | Freq: Two times a day (BID) | INTRAMUSCULAR | Status: DC
  Administered 2016-01-03: 40 mg via INTRAVENOUS
  Filled 2016-01-03: qty 1

## 2016-01-03 NOTE — Progress Notes (Addendum)
Daily Progress Note   Patient Name: Brenda Wells       Date: 01/03/2016 DOB: 20-Mar-1941  Age: 75 y.o. MRN#: [id number] Attending Physician: Elmarie Shiley, MD Primary Care Physician: Maryella Shivers, MD Admit Date: 12/28/2015  Reason for Consultation/Follow-up: Establishing goals of care  Subjective: Met today with patient, her son, and her son's girlfriend.  Ms. Wass reports that she knows that she is not improving significantly, and her ultimate goal is to transition to home. She has been saying that she is worried about transitioning home.  Her son is adamant about helping care for her at home after she is discharged.  I discussed with them pathway's forward including transition home with hospice support vs trial of rehab at [facility].  Both she and her son are in agreement with plan to transition home with hospice support as previously discussed.  Length of Stay: 6 days  Current Medications: Scheduled Meds:  . albuterol  2.5 mg Nebulization QID  . apixaban  5 mg Oral BID  . arformoterol  15 mcg Nebulization BID  . ceFEPime (MAXIPIME) IV  1 g Intravenous Q12H  . diltiazem  480 mg Oral Daily  . famotidine  20 mg Oral QHS  . feeding supplement (GLUCERNA SHAKE)  237 mL Oral TID BM  . furosemide  40 mg Oral Daily  . gabapentin  100 mg Oral QHS  . glipiZIDE  5 mg Oral QAC breakfast  . guaiFENesin  600 mg Oral BID  . insulin aspart  0-15 Units Subcutaneous TID WC  . insulin aspart  0-5 Units Subcutaneous QHS  . insulin aspart  3 Units Subcutaneous TID WC  . insulin detemir  12 Units Subcutaneous QHS  . loratadine  10 mg Oral Daily  . methylPREDNISolone (SOLU-MEDROL) injection  40 mg Intravenous Q12H  . pantoprazole  40 mg Oral Daily  . senna-docusate  2 tablet Oral BID  .  tiotropium  18 mcg Inhalation Daily    Continuous Infusions:    PRN Meds: acetaminophen **OR** acetaminophen, albuterol, morphine injection, morphine CONCENTRATE, ondansetron **OR** ondansetron (ZOFRAN) IV, ondansetron, polyethylene glycol, zolpidem  Physical Exam: Physical Exam             General: Alert, awake, some conversational dyspnea. Heart: Regular rate and rhythm. No murmur appreciated. Lungs: Fair air movement.  Wheezing throughout Abdomen: Soft, nontender, nondistended, positive bowel sounds.  Skin: Warm and dry Neuro: Grossly intact, nonfocal.  Vital Signs: BP 119/50 mmHg  Pulse 116  Temp(Src) 98 F (36.7 C) (Oral)  Resp 23  Ht '5\' 5"'$  (1.651 m)  Wt 68.9 kg (151 lb 14.4 oz)  BMI 25.28 kg/m2  SpO2 98% SpO2: SpO2: 98 % O2 Device: O2 Device: Nasal Cannula O2 Flow Rate: O2 Flow Rate (L/min): 3.5 L/min  Intake/output summary: No intake or output data in the 24 hours ending 01/03/16 2348 LBM: Last BM Date: 01/01/16 Baseline Weight: Weight: 67.7 kg (149 lb 4 oz) Most recent weight: Weight: 68.9 kg (151 lb 14.4 oz)       Palliative Assessment/Data:   Additional Data Reviewed: CBC    Component Value Date/Time   WBC 25.8* 01/01/2016 1035   RBC 3.68* 01/01/2016 1035   RBC 3.25* 09/30/2015 1440   HGB 9.7* 01/01/2016 1035   HCT 31.4* 01/01/2016 1035   PLT 295 01/01/2016 1035   MCV 85.3 01/01/2016 1035   MCH 26.4 01/01/2016 1035   MCHC 30.9 01/01/2016 1035   RDW 17.7* 01/01/2016 1035   LYMPHSABS 4.2* 12/28/2015 0945   MONOABS 2.6* 12/28/2015 0945   EOSABS 0.0 12/28/2015 0945   BASOSABS 0.0 12/28/2015 0945    CMP     Component Value Date/Time   NA 136 01/03/2016 0947   K 4.3 01/03/2016 0947   CL 90* 01/03/2016 0947   CO2 34* 01/03/2016 0947   GLUCOSE 293* 01/03/2016 0947   BUN 48* 01/03/2016 0947   CREATININE 0.88 01/03/2016 0947   CALCIUM 8.4* 01/03/2016 0947   PROT 5.5* 12/29/2015 0347   ALBUMIN 3.1* 12/29/2015 0347   AST 24 12/29/2015 0347    ALT 26 12/29/2015 0347   ALKPHOS 47 12/29/2015 0347   BILITOT 0.2* 12/29/2015 0347   GFRNONAA >60 01/03/2016 0947   GFRAA >60 01/03/2016 0947       Problem List:  Patient Active Problem List   Diagnosis Date Noted  . Acute respiratory failure with hypoxia ([place]) 12/28/2015  . Palliative care by specialist   . Goals of care, counseling/discussion   . DNR (do not resuscitate) discussion   . HCAP (healthcare-associated pneumonia) 12/20/2015  . Type II diabetes mellitus ([place]) 12/20/2015  . Acute on chronic respiratory failure with hypoxia and hypercapnia (HCC)   . Lactic acidosis 11/19/2015  . Pneumothorax on left 11/18/2015  . Acute on chronic respiratory failure ([place])   . Chest tube in place   . Encounter for hospice care discussion   . Normocytic anemia 10/21/2015  . Leukocytosis 10/21/2015  . Palliative care encounter 09/30/2015  . Dyspnea 09/30/2015  . DNR (do not resuscitate) 09/30/2015  . Atrial fibrillation with RVR ([place]) 09/29/2015  . Acute respiratory failure ([place]) 09/29/2015  . Steroid-induced hyperglycemia 09/09/2015  . GERD (gastroesophageal reflux disease) 09/02/2015  . Chronic diastolic (congestive) heart failure ([place]) 09/02/2015  . Chronic anticoagulation - Coumadin, CHADS2VASC=4 08/20/2015  . Acute on chronic respiratory failure with hypoxia ([place]) 07/22/2015  . Chronic atrial fibrillation ([place]) 07/22/2015  . History of DVT (deep vein thrombosis) 07/22/2015  . Anemia 07/22/2015  . COPD exacerbation ([place]) 07/21/2015     Palliative Care Assessment & Plan    1.Code Status:  DNR    Code Status Orders        Start     Ordered   12/28/15 1303  Do not attempt resuscitation (DNR)   Continuous    Question Answer Comment  In the event of cardiac or respiratory ARREST Do not call a "code blue"   In the event of cardiac or respiratory ARREST Do not perform Intubation, CPR, defibrillation or ACLS   In the event of cardiac or respiratory ARREST Use medication  by any route, position, wound care, and other measures to relive pain and suffering. May use oxygen, suction and manual treatment of airway obstruction as needed for comfort.      12/28/15 1302    Code Status History    Date Active Date Inactive Code Status Order ID Comments User Context   12/28/2015  9:30 AM 12/28/2015  1:03 PM DNR [id number]  Jola Schmidt, MD ED   12/24/2015 12:55 PM 12/27/2015  3:12 PM Partial Code [id number]  Melton Alar, PA-C Inpatient   12/20/2015  8:36 AM 12/24/2015 12:54 PM Partial Code [id number]  Willia Craze, NP ED   11/18/2015 10:29 PM 11/25/2015  8:13 PM DNR [id number]  Vianne Bulls, MD Inpatient   10/21/2015 12:30 AM 10/23/2015  4:27 PM DNR [id number]  Vianne Bulls, MD ED   09/29/2015  4:20 AM 10/04/2015  5:43 PM DNR [id number]  Rise Patience, MD ED   09/09/2015  8:55 PM 09/14/2015  4:48 PM DNR [id number]  Samella Parr, NP Inpatient   09/02/2015 10:17 AM 09/07/2015  6:36 PM DNR [id number]  Reyne Dumas, MD Inpatient   09/02/2015  3:10 AM 09/02/2015 10:17 AM Partial Code [id number]  Ivor Costa, MD ED   07/22/2015 12:30 AM 07/27/2015  6:42 PM DNR [id number]  Rise Patience, MD Inpatient       2. Goals of Care/Additional Recommendations:  Discussed at length with patient and her son.  Plan is for home with hospice support.  Limitations on Scope of Treatment: Avoid Hospitalization  3. Symptom Management:      1.Dyspnea: She has not been using roxanol.  Encouraged her to use this consistently for dyspnea.  4. Palliative Prophylaxis:   Aspiration, Bowel Regimen and Frequent Pain Assessment  5. Prognosis: < 6 months  6. Discharge Planning:  Home with Hospice   Care plan was discussed with patient, her son, Dr. Tyrell Antonio  Thank you for allowing the Palliative Medicine Team to assist in the care of this patient.   Time In: 1600 Time Out: 1640 Total Time 40 Prolonged Time Billed No        Micheline Rough, MD  01/03/2016, 11:48 PM  Please contact  Palliative Medicine Team phone at [phone] for questions and concerns.

## 2016-01-03 NOTE — Progress Notes (Signed)
Inpatient Diabetes Program Recommendations  AACE/ADA: New Consensus Statement on Inpatient Glycemic Control (2015)  Target Ranges:  Prepandial:   less than 140 mg/dL      Peak postprandial:   less than 180 mg/dL (1-2 hours)      Critically ill patients:  140 - 180 mg/dL   Review of Glycemic Control  Results for Brenda Wells, Damani G (MRN [id number]) as of 01/03/2016 12:00  Ref. Range 01/02/2016 11:44 01/02/2016 16:36 01/02/2016 22:20  Glucose-Capillary Latest Ref Range: 65-99 mg/dL 409215 (H) 811234 (H) 914242 (H)  Results for Brenda Wells, [name] (MRN [id number]) as of 01/03/2016 12:00  Ref. Range 12/28/2015 13:13  Hemoglobin A1C Latest Ref Range: 4.8-5.6 % 7.6 (H)   Needs insulin adjustment while on steroids.  Inpatient Diabetes Program Recommendations:    Increase Levemir to 12 units QD Increase Novolog to 4 units tidwc for meal coverage insulin while on steroids.  Will continue to follow. Thank you. Ailene Ardshonda Rosaisela Jamroz, RD, LDN, CDE Inpatient Diabetes Coordinator [phone]

## 2016-01-03 NOTE — Progress Notes (Addendum)
Chaplain responding to spiritual care consult.  Pt wishes to complete Advance Directive.  Provided support and education around ProofreaderAdvance Directive.  Pt wishes for son to be present when completing document.  Chaplain spoke with son Brenda Ash(Carl) on phone with pt and arranged to complete document tomorrow PM.     Left document with pt and will follow up tomorrow to complete document.     During conversation, provided brief support around Brenda Wells's end of life goals, desire to be at home, frustration around feeling lack of support from previous hospice provider.      Brenda Wells, Brenda Wells MDiv

## 2016-01-03 NOTE — Progress Notes (Signed)
PT Cancellation Note  Patient Details Name: Brenda Wells MRN: [id number] DOB: 02-05-1941   Cancelled Treatment:    Reason Eval/Treat Not Completed: Fatigue/lethargy limiting ability to participate [facility](Palliatve consult is pending. RN reports [facility]pneic [facility] check  back at another time.)   Sharen HeckHill, Mohannad Olivero Elizabeth Makani Seckman PT [phone]  01/03/2016, 3:36 PM

## 2016-01-03 NOTE — Progress Notes (Signed)
[facility] PROGRESS NOTE  REOLA BUCKLES ZOX:[id number] DOB: 1941-01-07 DOA: 12/28/2015 PCP: [name], MD  Assessment/Plan: Acute on chronic respiratory failure with hypoxia (HCC/ COPD exacerbation (HCC) in the setting of ongoing tobacco abuse. PNA Continue her on nasal cannula 3- L, continue inhalers. Use morphine for dyspnea. IV antibiotics, nebulizer,  IV solumedrol taper to BID.  Smoking cessation counseling provided.  discontinue vancomycin 4-14 Continue with cefepime.  Repeated chest x ray ; mild increase size pleural effusion.  Still with dyspnea, feeling more weak, does not feel when will be able to go home after discharge from hospital.   Chronic atrial fibrillation (HCC) Controlled, continue Eluquis.  Leukocytosis: unclear question if there is a portion of her leukocytosis that is due to steroids, but it's even higher than when she was discharged.  Continue with IV antibiotics.  WBC fluctuates.   AKI; cr mildly increase to 1. Monitor on lasix. Will give one extra dose of lasix due to worsening hypoxia.  Cr has decreased.   Type II diabetes mellitus (HCC): Continue her glipizide at home started on sliding scale insulin. Increase levemir. Taper steroids.    Code Status: DNR Family Communication: care discussed with patient  Disposition Plan: patient has become more weak, wont be able to go home. Palliative will follow up today for disposition and discussed most form.    Consultants:  Palliative care  Procedures:  none  Antibiotics:  Cefepime   Vancomycin   HPI/Subjective: Still SOB, feeling weak.    Objective: Filed Vitals:   01/02/16 2111 01/03/16 0520  BP: 122/78 118/66  Pulse: 82 100  Temp: 97.5 F (36.4 C) 97.5 F (36.4 C)  Resp: 20 16    Intake/Output Summary (Last 24 hours) at 01/03/16 1238 Last data filed at 01/02/16 1831  Gross per 24 hour  Intake      0 ml  Output   1100 ml  Net  -1100 ml   Filed Weights    12/28/15 1300 12/29/15 0559  Weight: 67.7 kg (149 lb 4 oz) 68.9 kg (151 lb 14.4 oz)    Exam:   General:  NAD  Cardiovascular: S 1, S 2 RRR  Respiratory: BL wheezing.   Abdomen: BS present, soft, nt  Musculoskeletal: no edema   Data Reviewed: Basic Metabolic Panel:  Recent Labs Lab 12/29/15 0347 12/30/15 0357 01/01/16 1035 01/02/16 1440 01/03/16 0947  NA 138 139 140 138 136  K 4.9 3.9 4.3 4.0 4.3  CL 91* 91* 89* 91* 90*  CO2 34* 36* 34* 34* 34*  GLUCOSE 303* 227* 295* 264* 293*  BUN 32* 35* 43* 43* 48*  CREATININE 0.82 0.75 1.00 0.80 0.88  CALCIUM 8.8* 8.8* 8.7* 8.4* 8.4*   Liver Function Tests:  Recent Labs Lab 12/28/15 0945 12/29/15 0347  AST 20 24  ALT 23 26  ALKPHOS 50 47  BILITOT 0.2* 0.2*  PROT 5.9* 5.5*  ALBUMIN 3.4* 3.1*   No results for input(s): LIPASE, AMYLASE in the last 168 hours. No results for input(s): AMMONIA in the last 168 hours. CBC:  Recent Labs Lab 12/28/15 0945 12/29/15 0347 12/30/15 0357 12/31/15 1111 01/01/16 1035  WBC 32.5* 18.8* 26.3* 27.6* 25.8*  NEUTROABS 25.7*  --   --   --   --   HGB 10.5* 9.0* 9.0* 9.6* 9.7*  HCT 34.3* 28.9* 28.8* 30.5* 31.4*  MCV 88.2 85.8 87.0 85.2 85.3  PLT 368 286 307 304 295   Cardiac Enzymes:  Recent Labs Lab 12/28/15 0945  TROPONINI 0.03   BNP (last 3 results)  Recent Labs  11/21/15 0402 12/20/15 0448 12/28/15 1009  BNP 284.2* 68.6 102.6*    ProBNP (last 3 results) No results for input(s): PROBNP in the last 8760 hours.  CBG:  Recent Labs Lab 01/02/16 0728 01/02/16 1144 01/02/16 1636 01/02/16 2220 01/03/16 0739  GLUCAP 196* 215* 234* 242* 213*    Recent Results (from the past 240 hour(s))  Culture, blood (routine x 2) Call MD if unable to obtain prior to antibiotics being given     Status: None   Collection Time: 12/28/15 10:14 AM  Result Value Ref Range Status   Specimen Description BLOOD RIGHT ANTECUBITAL  Final   Special Requests BOTTLES DRAWN AEROBIC ONLY  6ML  Final   Culture   Final    NO GROWTH 5 DAYS [facility]    Report Status 01/02/2016 FINAL  Final  Culture, blood (routine x 2) Call MD if unable to obtain prior to antibiotics being given     Status: None   Collection Time: 12/28/15 12:10 PM  Result Value Ref Range Status   Specimen Description BLOOD RIGHT ANTECUBITAL  Final   Special Requests BOTTLES DRAWN AEROBIC AND ANAEROBIC 5ML  Final   Culture   Final    NO GROWTH 5 DAYS Performed at [facility]    Report Status 01/02/2016 FINAL  Final     Studies: No results found.  Scheduled Meds: . albuterol  2.5 mg Nebulization QID  . apixaban  5 mg Oral BID  . arformoterol  15 mcg Nebulization BID  . ceFEPime (MAXIPIME) IV  1 g Intravenous Q12H  . diltiazem  480 mg Oral Daily  . famotidine  20 mg Oral QHS  . feeding supplement (GLUCERNA SHAKE)  237 mL Oral TID BM  . furosemide  40 mg Oral Daily  . gabapentin  100 mg Oral QHS  . glipiZIDE  5 mg Oral QAC breakfast  . guaiFENesin  600 mg Oral BID  . insulin aspart  0-15 Units Subcutaneous TID WC  . insulin aspart  0-5 Units Subcutaneous QHS  . insulin aspart  3 Units Subcutaneous TID WC  . insulin detemir  10 Units Subcutaneous QHS  . loratadine  10 mg Oral Daily  . methylPREDNISolone (SOLU-MEDROL) injection  40 mg Intravenous Q12H  . pantoprazole  40 mg Oral Daily  . senna-docusate  2 tablet Oral BID  . tiotropium  18 mcg Inhalation Daily   Continuous Infusions:    Active Problems:   COPD exacerbation (HCC)   Acute on chronic respiratory failure with hypoxia (HCC)   Chronic atrial fibrillation (HCC)   DNR (do not resuscitate)   Leukocytosis   Type II diabetes mellitus (HCC)   Acute respiratory failure with hypoxia (HCC)    Time spent: 25 minutes.     Hartley Barefootegalado, Xavius Spadafore A  [facility] Pager [phone]. If 7PM-7AM, please contact night-coverage at [email or web address], password [email or web address] 01/03/2016, 12:38 PM  LOS: 6 days

## 2016-01-03 NOTE — Progress Notes (Signed)
OT Cancellation Note  Patient Details Name: Brenda Wells MRN: [id number] DOB: 06-15-1941   Cancelled Treatment:    Reason Eval/Treat Not Completed: Other (comment)  Reason Eval/Treat Not Completed: Fatigue/lethargy limiting ability to participate [facility](Palliatve consult is pending. RN reports very dyspneic [facility]. Will checkback next day.    [name], Thaddius Manes D 01/03/2016, 5:15 PM

## 2016-01-04 ENCOUNTER — Other Ambulatory Visit: Payer: Self-pay | Admitting: *Deleted

## 2016-01-04 DIAGNOSIS — R0600 Dyspnea, unspecified: Secondary | ICD-10-CM

## 2016-01-04 DIAGNOSIS — Z515 Encounter for palliative care: Secondary | ICD-10-CM | POA: Insufficient documentation

## 2016-01-04 LAB — GLUCOSE, CAPILLARY
GLUCOSE-CAPILLARY: 331 mg/dL — AB (ref 65–99)
Glucose-Capillary: 259 mg/dL — ABNORMAL HIGH (ref 65–99)

## 2016-01-04 LAB — BASIC METABOLIC PANEL
Anion gap: 9 (ref 5–15)
BUN: 52 mg/dL — ABNORMAL HIGH (ref 6–20)
CALCIUM: 8.3 mg/dL — AB (ref 8.9–10.3)
CO2: 38 mmol/L — ABNORMAL HIGH (ref 22–32)
CREATININE: 0.95 mg/dL (ref 0.44–1.00)
Chloride: 92 mmol/L — ABNORMAL LOW (ref 101–111)
GFR calc non Af Amer: 58 mL/min — ABNORMAL LOW (ref >60)
Glucose, Bld: 325 mg/dL — ABNORMAL HIGH (ref 65–99)
Potassium: 4.6 mmol/L (ref 3.5–5.1)
SODIUM: 139 mmol/L (ref 135–145)

## 2016-01-04 LAB — CBC
HCT: 28.6 % — ABNORMAL LOW (ref 36.0–46.0)
Hemoglobin: 8.6 g/dL — ABNORMAL LOW (ref 12.0–15.0)
MCH: 25.7 pg — ABNORMAL LOW (ref 26.0–34.0)
MCHC: 30.1 g/dL (ref 30.0–36.0)
MCV: 85.6 fL (ref 78.0–100.0)
Platelets: 194 10*3/uL (ref 150–400)
RBC: 3.34 MIL/uL — ABNORMAL LOW (ref 3.87–5.11)
RDW: 18 % — AB (ref 11.5–15.5)
WBC: 21.8 10*3/uL — ABNORMAL HIGH (ref 4.0–10.5)

## 2016-01-04 MED ORDER — PREDNISONE 20 MG PO TABS
60.0000 mg | ORAL_TABLET | Freq: Every day | ORAL | Status: DC
  Administered 2016-01-04 – 2016-01-05 (×2): 60 mg via ORAL
  Filled 2016-01-04 (×2): qty 3

## 2016-01-04 MED ORDER — MORPHINE SULFATE (CONCENTRATE) 10 MG/0.5ML PO SOLN: 5.0000 mg | ORAL | Status: AC | PRN

## 2016-01-04 MED ORDER — IPRATROPIUM-ALBUTEROL 0.5-2.5 (3) MG/3ML IN SOLN: 3.0000 mL | Freq: Four times a day (QID) | RESPIRATORY_TRACT | Status: AC

## 2016-01-04 MED ORDER — LEVOFLOXACIN 750 MG PO TABS: 750.0000 mg | ORAL_TABLET | Freq: Every day | ORAL | Status: DC

## 2016-01-04 MED ORDER — LEVOFLOXACIN IN D5W 750 MG/150ML IV SOLN: 750.0000 mg | INTRAVENOUS | Status: DC

## 2016-01-04 MED ORDER — INSULIN DETEMIR 100 UNIT/ML ~~LOC~~ SOLN: 12.0000 [IU] | Freq: Every day | SUBCUTANEOUS | Status: DC

## 2016-01-04 MED ORDER — ALBUTEROL SULFATE (2.5 MG/3ML) 0.083% IN NEBU: 2.5000 mg | INHALATION_SOLUTION | Freq: Four times a day (QID) | RESPIRATORY_TRACT | Status: DC

## 2016-01-04 MED ORDER — MORPHINE SULFATE (CONCENTRATE) 10 MG/0.5ML PO SOLN
5.0000 mg | Freq: Every evening | ORAL | Status: DC
  Administered 2016-01-04: 5 mg via ORAL
  Filled 2016-01-04: qty 0.5

## 2016-01-04 MED ORDER — PREDNISONE 20 MG PO TABS: 40.0000 mg | ORAL_TABLET | Freq: Every day | ORAL | Status: DC

## 2016-01-04 MED ORDER — BUDESONIDE 0.5 MG/2ML IN SUSP: 0.5000 mg | Freq: Two times a day (BID) | RESPIRATORY_TRACT | Status: DC

## 2016-01-04 MED ORDER — INSULIN ASPART 100 UNIT/ML ~~LOC~~ SOLN: 3.0000 [IU] | Freq: Three times a day (TID) | SUBCUTANEOUS | Status: DC

## 2016-01-04 MED ORDER — LEVOFLOXACIN 750 MG PO TABS
750.0000 mg | ORAL_TABLET | Freq: Every day | ORAL | Status: DC
  Administered 2016-01-04 – 2016-01-05 (×2): 750 mg via ORAL
  Filled 2016-01-04 (×2): qty 1

## 2016-01-04 NOTE — Progress Notes (Signed)
Daily Progress Note   Patient Name: Brenda Wells       Date: 01/04/2016 DOB: [birth year]  Age: 75 y.o. MRN#: [id number] Attending Physician: Alba CoryBelkys A Regalado, MD Primary Care Physician: [name],[name], MD Admit Date: 12/28/2015  Reason for Consultation/Follow-up: Establishing goals of care, non pain symptom management of dyspnea.   Subjective: Patient was trying to participate with PT/OT upon my arrival into the room, she is clearly dyspneic, has bronchospasm type dyspneic spells.  Long talk with patient regarding symptom management of dyspnea with advanced COPD, encouraged her to use PRN PO Roxanol, use the bedside fan.  I will schedule low dose Roxanol every night and monitor She wants to go home with home health care, but also states that she understands the terminal nature of her condition, "I know I'm dying."  Length of Stay: 7 days  Current Medications: Scheduled Meds:  . albuterol  2.5 mg Nebulization QID  . apixaban  5 mg Oral BID  . arformoterol  15 mcg Nebulization BID  . ceFEPime (MAXIPIME) IV  1 g Intravenous Q12H  . diltiazem  480 mg Oral Daily  . famotidine  20 mg Oral QHS  . feeding supplement (GLUCERNA SHAKE)  237 mL Oral TID BM  . furosemide  40 mg Oral Daily  . gabapentin  100 mg Oral QHS  . glipiZIDE  5 mg Oral QAC breakfast  . guaiFENesin  600 mg Oral BID  . insulin aspart  0-15 Units Subcutaneous TID WC  . insulin aspart  0-5 Units Subcutaneous QHS  . insulin aspart  3 Units Subcutaneous TID WC  . insulin detemir  12 Units Subcutaneous QHS  . loratadine  10 mg Oral Daily  . morphine CONCENTRATE  5 mg Oral UD  . pantoprazole  40 mg Oral Daily  . predniSONE  60 mg Oral Q breakfast  . senna-docusate  2 tablet Oral BID  . tiotropium  18 mcg Inhalation  Daily    Continuous Infusions:    PRN Meds: acetaminophen **OR** acetaminophen, albuterol, morphine injection, morphine CONCENTRATE, ondansetron **OR** ondansetron (ZOFRAN) IV, ondansetron, polyethylene glycol, zolpidem  Physical Exam: Physical Exam             General: Alert, awake, some conversational dyspnea. Heart: Regular rate and rhythm. No murmur appreciated. Lungs: diminished in some areas, Wheezing  throughout Abdomen: Soft, nontender, nondistended, positive bowel sounds.  Skin: Warm and dry Neuro: Grossly intact, nonfocal.  Vital Signs: BP 145/58 mmHg  Pulse 81  Temp(Src) 97.8 F (36.6 C) (Oral)  Resp 20  Ht  (1.651 m)  Wt 68.9 kg (151 lb 14.4 oz)  BMI 25.28 kg/m2  SpO2 97% SpO2: SpO2: 97 % O2 Device: O2 Device: Nasal Cannula O2 Flow Rate: O2 Flow Rate (L/min): 3.5 L/min  Intake/output summary:   Intake/Output Summary (Last 24 hours) at 01/04/16 1505 Last data filed at 01/04/16 1226  Gross per 24 hour  Intake    870 ml  Output   1201 ml  Net   -331 ml   LBM: Last BM Date: 01/03/16 Baseline Weight: Weight: 67.7 kg (149 lb 4 oz) Most recent weight: Weight: 68.9 kg (151 lb 14.4 oz)       Palliative Assessment/Data:   Additional Data Reviewed: CBC    Component Value Date/Time   WBC 21.8* 01/04/2016 0340   RBC 3.34* 01/04/2016 0340   RBC 3.25* 09/30/2015 1440   HGB 8.6* 01/04/2016 0340   HCT 28.6* 01/04/2016 0340   PLT 194 01/04/2016 0340   MCV 85.6 01/04/2016 0340   MCH 25.7* 01/04/2016 0340   MCHC 30.1 01/04/2016 0340   RDW 18.0* 01/04/2016 0340   LYMPHSABS 4.2* 12/28/2015 0945   MONOABS 2.6* 12/28/2015 0945   EOSABS 0.0 12/28/2015 0945   BASOSABS 0.0 12/28/2015 0945    CMP     Component Value Date/Time   NA 139 01/04/2016 0340   K 4.6 01/04/2016 0340   CL 92* 01/04/2016 0340   CO2 38* 01/04/2016 0340   GLUCOSE 325* 01/04/2016 0340   BUN 52* 01/04/2016 0340   CREATININE 0.95 01/04/2016 0340   CALCIUM 8.3* 01/04/2016 0340    PROT 5.5* 12/29/2015 0347   ALBUMIN 3.1* 12/29/2015 0347   AST 24 12/29/2015 0347   ALT 26 12/29/2015 0347   ALKPHOS 47 12/29/2015 0347   BILITOT 0.2* 12/29/2015 0347   GFRNONAA 58* 01/04/2016 0340   GFRAA >60 01/04/2016 0340       Problem List:  Patient Active Problem List   Diagnosis Date Noted  . Acute respiratory failure with hypoxia (HCC) 12/28/2015  . Palliative care by specialist   . Goals of care, counseling/discussion   . DNR (do not resuscitate) discussion   . HCAP (healthcare-associated pneumonia) 12/20/2015  . Type II diabetes mellitus (HCC) 12/20/2015  . Acute on chronic respiratory failure with hypoxia and hypercapnia (HCC)   . Lactic acidosis 11/19/2015  . Pneumothorax on left 11/18/2015  . Acute on chronic respiratory failure (HCC)   . Chest tube in place   . Encounter for hospice care discussion   . Normocytic anemia 10/21/2015  . Leukocytosis 10/21/2015  . Palliative care encounter 09/30/2015  . Dyspnea 09/30/2015  . DNR (do not resuscitate) 09/30/2015  . Atrial fibrillation with RVR (HCC) 09/29/2015  . Acute respiratory failure (HCC) 09/29/2015  . Steroid-induced hyperglycemia 09/09/2015  . GERD (gastroesophageal reflux disease) 09/02/2015  . Chronic diastolic (congestive) heart failure (HCC) 09/02/2015  . Chronic anticoagulation - Coumadin, CHADS2VASC=4 08/20/2015  . Acute on chronic respiratory failure with hypoxia (HCC) 07/22/2015  . Chronic atrial fibrillation (HCC) 07/22/2015  . History of DVT (deep vein thrombosis) 07/22/2015  . Anemia 07/22/2015  . COPD exacerbation (HCC) 07/21/2015     Palliative Care Assessment & Plan    1.Code Status:  DNR    Code Status Orders  Start     Ordered   12/28/15 1303  Do not attempt resuscitation (DNR)   Continuous    Question Answer Comment  In the event of cardiac or respiratory ARREST Do not call a "code blue"   In the event of cardiac or respiratory ARREST Do not perform Intubation, CPR,  defibrillation or ACLS   In the event of cardiac or respiratory ARREST Use medication by any route, position, wound care, and other measures to relive pain and suffering. May use oxygen, suction and manual treatment of airway obstruction as needed for comfort.      12/28/15 1302    Code Status History    Date Active Date Inactive Code Status Order ID Comments User Context   12/28/2015  9:30 AM 12/28/2015  1:03 PM DNR [id number]  Azalia Bilis, MD ED   12/24/2015 12:55 PM 12/27/2015  3:12 PM Partial Code [id number]  Stephani Police, PA-C Inpatient   12/20/2015  8:36 AM 12/24/2015 12:54 PM Partial Code [id number]  Meredith Pel, NP ED   11/18/2015 10:29 PM 11/25/2015  8:13 PM DNR [id number]  Briscoe Deutscher, MD Inpatient   10/21/2015 12:30 AM 10/23/2015  4:27 PM DNR [id number]  Briscoe Deutscher, MD ED   09/29/2015  4:20 AM 10/04/2015  5:43 PM DNR [id number]  Eduard Clos, MD ED   09/09/2015  8:55 PM 09/14/2015  4:48 PM DNR [id number]  Russella Dar, NP Inpatient   09/02/2015 10:17 AM 09/07/2015  6:36 PM DNR [id number]  Richarda Overlie, MD Inpatient   09/02/2015  3:10 AM 09/02/2015 10:17 AM Partial Code [id number]  Lorretta Harp, MD ED   07/22/2015 12:30 AM 07/27/2015  6:42 PM DNR [id number]  Eduard Clos, MD Inpatient       2. Goals of Care/Additional Recommendations:  Discussed at length with patient see above   Limitations on Scope of Treatment: Avoid Hospitalization  3. Symptom Management:      1.Dyspnea:   Encouraged her to use this consistently for dyspnea.  4. Palliative Prophylaxis:   Aspiration, Bowel Regimen and Frequent Pain Assessment  5. Prognosis: < 6 months  6. Discharge Planning:  Home with Home health care.    Care plan was discussed with patient, PT/OT and Dr. Sunnie Nielsen  Thank you for allowing the Palliative Medicine Team to assist in the care of this patient.   Time In: 1430 Time Out: 1505 Total Time 35 Prolonged Time Billed No        Rosalin Hawking, MD  01/04/2016, 3:05 PM    Please contact Palliative Medicine Team phone at [phone] for questions and concerns.

## 2016-01-04 NOTE — Progress Notes (Signed)
PT Cancellation Note  Patient Details Name: Harlow Asadna G Schlafer MRN: [id number] DOB: 07-30-1941   Cancelled Treatment:    Reason Eval/Treat Not Completed: Medical issues which prohibited therapy (very dyspneaic with OT. will check back in AM. Not certain if she can tolerate activity as per OT note.)   Rada HayHill, Savana Spina Elizabeth 01/04/2016, 4:41 PM

## 2016-01-04 NOTE — Progress Notes (Signed)
CSW received referral for New SNF.  CSW reviewed chart and noted that plan is for pt to return home with hospice support.  CSW spoke with RN who confirmed plan is for home with hospice and RNCM is aware.   Inappropriate CSW referral.  CSW signing off.   Loletta SpecterSuzanna Kidd, MSW, LCSW Clinical Social Work [phone]

## 2016-01-04 NOTE — Care Management Important Message (Signed)
Important Message  Patient Details  Name: Brenda Wells MRN: [id number] Date of Birth: 01-Jun-1941   Medicare Important Message Given:  Yes    Haskell FlirtJamison, Maat Kafer 01/04/2016, 9:03 AMImportant Message  Patient Details  Name: Brenda Wells MRN: [id number] Date of Birth: 01-Jun-1941   Medicare Important Message Given:  Yes    Haskell FlirtJamison, Jarom Govan 01/04/2016, 9:02 AM

## 2016-01-04 NOTE — Progress Notes (Signed)
This CM met with pt at bedside to discuss DC plan again.  Pt has been admitted to the hospital 8 times in the last 6 months. This qualifies her for our [place] program (High Risk for readmission).  This was discussed with medical advisor and attending who are in agreement. Hopefully with more intense support at home pt can stay out of the hospital.  Pt will be seen by nursing from [facility] everyday for first 30 days.  Pt feels this would be better for her right now than hospice services.  HH orders received.  This Cm also spoke with pt daughter in law over the phone to explain services. CM will continue to follow and assist as needed. Marney Doctor RN,BSN,NCM [phone]

## 2016-01-04 NOTE — Progress Notes (Signed)
[facility] PROGRESS NOTE  Brenda Asadna G Batch [id number] DOB: June 05, 1941 DOA: 12/28/2015 PCP: [name],[name], MD  Assessment/Plan: Brenda Wells is a 75 y.o. female past medical history of severe COPD on 3 L of oxygen at home with ongoing tobacco abuse, chronic atrial fibrillation on anticoagulation, chronic diastolic heart failure, multiple admissions in the last 6 months recently discharged on 12/27/2015 With severe COPD exacerbation/healthcare associated pneumonia hospice and palliative care evaluated the patient at that time and the patient was made DNR/DNI, but no arrangements were made for hospice to follow her at home she went home and comes in today for severe dyspnea at rest progressively getting worst, the patient relates she continues to smoke.  She has been getting treatment for COPD exacerbation, IV antibiotics to cover for PNA. Plan is to discharge home with Home health service and daily nurse visits at home.   Acute on chronic respiratory failure with hypoxia (HCC/ COPD exacerbation (HCC) in the setting of ongoing tobacco abuse. PNA Continue her on nasal cannula 3- L, continue inhalers. Use morphine for dyspnea. IV antibiotics, nebulizer. IV solumedrol change to prednisone 4-18 Smoking cessation counseling provided.  Discontinue vancomycin 4-14 Continue with cefepime.  Repeated chest x ray ; mild increase size pleural effusion.  Continue with lasix. Plan to discharge on prednisone and Levaquin.  On morphine PRN for dyspnea. Will inform palliative regarding patient concern.   Chronic atrial fibrillation (HCC) Controlled, continue Eluquis.  Leukocytosis: unclear question if there is a portion of her leukocytosis that is due to steroids, but it's even higher than when she was discharged.  Continue with IV antibiotics.  WBC fluctuates.   AKI; cr mildly increase to 1. Monitor on lasix. Will give one extra dose of lasix due to worsening hypoxia.  Cr has decreased.    Type II diabetes mellitus (HCC): Continue her glipizide at home started on sliding scale insulin. Increase levemir. Taper steroids.    Code Status: DNR Family Communication: care discussed with patient and daughter in law over phone.  Disposition Plan: discharge in 24 hours, family arranging help at home   Consultants:  Palliative care  Procedures:  none  Antibiotics:  Cefepime   Vancomycin ; Stop on 4-14  HPI/Subjective: Still SOB, but overall feels better than when she came.  She plans to go home after discharge.  She report no significant improvement of SOB with morphine, it doesn't help a lot.    Objective: Filed Vitals:   01/04/16 0617 01/04/16 0935  BP: 134/57 131/55  Pulse: 107 95  Temp: 98.3 F (36.8 C)   Resp: 19     Intake/Output Summary (Last 24 hours) at 01/04/16 1039 Last data filed at 01/04/16 0500  Gross per 24 hour  Intake    390 ml  Output    800 ml  Net   -410 ml   Filed Weights   12/28/15 1300 12/29/15 0559  Weight: 67.7 kg (149 lb 4 oz) 68.9 kg (151 lb 14.4 oz)    Exam:   General:  NAD  Cardiovascular: S 1, S 2 RRR  Respiratory: BL air movement, only sporadic wheezes   Abdomen: BS present, soft, nt  Musculoskeletal: no edema   Data Reviewed: Basic Metabolic Panel:  Recent Labs Lab 12/30/15 0357 01/01/16 1035 01/02/16 1440 01/03/16 0947 01/04/16 0340  NA 139 140 138 136 139  K 3.9 4.3 4.0 4.3 4.6  CL 91* 89* 91* 90* 92*  CO2 36* 34* 34* 34* 38*  GLUCOSE 227* 295* 264*  293* 325*  BUN 35* 43* 43* 48* 52*  CREATININE 0.75 1.00 0.80 0.88 0.95  CALCIUM 8.8* 8.7* 8.4* 8.4* 8.3*   Liver Function Tests:  Recent Labs Lab 12/29/15 0347  AST 24  ALT 26  ALKPHOS 47  BILITOT 0.2*  PROT 5.5*  ALBUMIN 3.1*   No results for input(s): LIPASE, AMYLASE in the last 168 hours. No results for input(s): AMMONIA in the last 168 hours. CBC:  Recent Labs Lab 12/29/15 0347 12/30/15 0357 12/31/15 1111 01/01/16 1035  01/04/16 0340  WBC 18.8* 26.3* 27.6* 25.8* 21.8*  HGB 9.0* 9.0* 9.6* 9.7* 8.6*  HCT 28.9* 28.8* 30.5* 31.4* 28.6*  MCV 85.8 87.0 85.2 85.3 85.6  PLT 286 307 304 295 194   Cardiac Enzymes: No results for input(s): CKTOTAL, CKMB, CKMBINDEX, TROPONINI in the last 168 hours. BNP (last 3 results)  Recent Labs  11/21/15 0402 12/20/15 0448 12/28/15 1009  BNP 284.2* 68.6 102.6*    ProBNP (last 3 results) No results for input(s): PROBNP in the last 8760 hours.  CBG:  Recent Labs Lab 01/03/16 0739 01/03/16 1203 01/03/16 1738 01/03/16 2113 01/04/16 0732  GLUCAP 213* 226* 268* 266* 259*    Recent Results (from the past 240 hour(s))  Culture, blood (routine x 2) Call MD if unable to obtain prior to antibiotics being given     Status: None   Collection Time: 12/28/15 10:14 AM  Result Value Ref Range Status   Specimen Description BLOOD RIGHT ANTECUBITAL  Final   Special Requests BOTTLES DRAWN AEROBIC ONLY  Final   Culture   Final    NO GROWTH 5 DAYS Performed at [facility]    Report Status 01/02/2016 FINAL  Final  Culture, blood (routine x 2) Call MD if unable to obtain prior to antibiotics being given     Status: None   Collection Time: 12/28/15 12:10 PM  Result Value Ref Range Status   Specimen Description BLOOD RIGHT ANTECUBITAL  Final   Special Requests BOTTLES DRAWN AEROBIC AND ANAEROBIC  Final   Culture   Final    NO GROWTH 5 DAYS Performed at [facility]    Report Status 01/02/2016 FINAL  Final     Studies: No results found.  Scheduled Meds: . albuterol  2.5 mg Nebulization QID  . apixaban  5 mg Oral BID  . arformoterol  15 mcg Nebulization BID  . ceFEPime (MAXIPIME) IV  1 g Intravenous Q12H  . diltiazem  480 mg Oral Daily  . famotidine  20 mg Oral QHS  . feeding supplement (GLUCERNA SHAKE)  237 mL Oral TID BM  . furosemide  40 mg Oral Daily  . gabapentin  100 mg Oral QHS  . glipiZIDE  5 mg Oral QAC breakfast  . guaiFENesin  600  mg Oral BID  . insulin aspart  0-15 Units Subcutaneous TID WC  . insulin aspart  0-5 Units Subcutaneous QHS  . insulin aspart  3 Units Subcutaneous TID WC  . insulin detemir  12 Units Subcutaneous QHS  . loratadine  10 mg Oral Daily  . pantoprazole  40 mg Oral Daily  . predniSONE  60 mg Oral Q breakfast  . senna-docusate  2 tablet Oral BID  . tiotropium  18 mcg Inhalation Daily   Continuous Infusions:    Active Problems:   COPD exacerbation (HCC)   Acute on chronic respiratory failure with hypoxia (HCC)   Chronic atrial fibrillation (HCC)   DNR (do not resuscitate)  Leukocytosis   Type II diabetes mellitus (HCC)   Acute respiratory failure with hypoxia (HCC)    Time spent: 25 minutes.     Hartley Barefoot A  [facility] Pager [phone]. If 7PM-7AM, please contact night-coverage at [email or web address], password [facility] 01/04/2016, 10:39 AM  LOS: 7 days

## 2016-01-04 NOTE — Progress Notes (Signed)
Inpatient Diabetes Program Recommendations  AACE/ADA: New Consensus Statement on Inpatient Glycemic Control (2015)  Target Ranges:  Prepandial:   less than 140 mg/dL      Peak postprandial:   less than 180 mg/dL (1-2 hours)      Critically ill patients:  140 - 180 mg/dL   Review of Glycemic Control  Results for Brenda Wells, Brenda Wells (MRN [id number]) as of 01/04/2016 09:35  Ref. Range 01/03/2016 09:47 01/04/2016 03:40  Glucose Latest Ref Range: 65-99 mg/dL 409293 (H) 811325 (H)   Lab glucose > 300 mg/dL. Continue to titrate Levemir.  Inpatient Diabetes Program Recommendations:    Increase Levemir to 14 units QHS. Increase Novolog to 4 units tidwc.  Will continue to follow.  Thank you. Ailene Ardshonda Esequiel Kleinfelter, RD, LDN, CDE Inpatient Diabetes Coordinator [phone]

## 2016-01-04 NOTE — Evaluation (Signed)
Occupational Therapy Evaluation Patient Details Name: Brenda Wells MRN: [id number] DOB: 31-Jan-1941 Today's Date: 01/04/2016    History of Present Illness Brenda Wells is a 75 y.o. female past medical history of severe COPD on 3 L of oxygen at home with ongoing tobacco abuse, chronic atrial fibrillation on anticoagulation, chronic diastolic heart failure, multiple admissions in the last 6 months recently discharged on 12/27/2015 With severe COPD exacerbation/healthcare associated pneumonia hospice and palliative care evaluated the patient at that time and the patient was made DNR/DNI, but no arrangements were made for hospice to follow her at home she went home and comes in again for severe dyspnea at rest progressively getting worst, the patient relates she continues to smoke.   Clinical Impression   Patient presenting with decreased ADL and functional mobility independence. Patient required assistance PTA, pt reports she has someone come help her bathe and that her son just "picks me up" for transfers. Patient currently functioning at an overall min to max assist level, bed level for ADLs due to increased dyspnea and decreased overall activity tolerance/endurance. Patient will benefit from acute OT to increase overall independence in the areas of ADLs, functional mobility, energy conservation education and overall safety in order to safely discharge SNF vs home.   02 sats and HR on 3.5L/min supplemental 02 via [place]: Seated EOB after engaging in bed mobility = 02-82% HR-194 With seated rest break and after ~2 minutes= 02-92% HR-72 After scooting towards HOB= 02-81% HR-25 Laying supine after activity= 02-94% HR-142     Follow Up Recommendations  SNF;Supervision/Assistance - 24 hour (IF pt refuses, SNF recommend HHOT)    Equipment Recommendations  None recommended by OT    Recommendations for Other Services  None at this time   Precautions / Restrictions Precautions Precautions:  Fall Precaution Comments: watch 02 and HR Restrictions Weight Bearing Restrictions: No     Mobility Bed Mobility Overal bed mobility: Needs Assistance Bed Mobility: Supine to Sit;Sit to Supine     Supine to sit: Min guard;HOB elevated Sit to supine: Min assist;HOB elevated   General bed mobility comments: Min guard to min assist for safety, min assist for management of BLEs back into bed   Transfers General transfer comment: Did not occur, pt felt she was unable to stand. Pt reports "my son just lifts me". Pt did perform some scoots towards HOB, but with increased time and multiple rest breaks.     Balance Overall balance assessment: Needs assistance Sitting-balance support: Bilateral upper extremity supported;Feet supported Sitting balance-Leahy Scale: Good     ADL Overall ADL's : Needs assistance/impaired Eating/Feeding: Set up;Bed level   Grooming: Set up;Bed level   Upper Body Bathing: Moderate assistance;Bed level   Lower Body Bathing: Maximal assistance;Bed level   Upper Body Dressing : Moderate assistance;Bed level   Lower Body Dressing: Maximal assistance;Bed level     Toilet Transfer Details (indicate cue type and reason): Pt unable to tolerate due to Ryland Groupdyspena        Tub/Shower Transfer Details (indicate cue type and reason): Pt states normally sponge bathes, shower n/a at this time   General ADL Comments: Pt found supine in bed. Pt willing to work with therapist, but aprehensive stating that she felt very weak and unsure how much she could do. Pt engaged in bed mobility with HOB elevated and use of bed rails. Pt sat EOB and stated she did not feel her LEs would hold her in order to [facility]. Therefore, pt  performed side scooting task towards HOB. During this task, pt required multiple rest breaks due to dyspnea. Pt with shaky episode (doctor present during this), then pt laid back in supine with min assist.     Pertinent Vitals/Pain Pain  Assessment: No/denies pain     Hand Dominance Right   Extremity/Trunk Assessment Upper Extremity Assessment Upper Extremity Assessment: Generalized weakness   Lower Extremity Assessment Lower Extremity Assessment: Defer to PT evaluation   Cervical / Trunk Assessment Cervical / Trunk Assessment: Normal   Communication Communication Communication: No difficulties   Cognition Arousal/Alertness: Awake/alert Behavior During Therapy: WFL for tasks assessed/performed;Flat affect Overall Cognitive Status: Within Functional Limits for tasks assessed             Home Living Family/patient expects to be discharged to:: Private residence Living Arrangements: Children Available Help at Discharge: Family;Available 24 hours/day Type of Home: House Home Access: Stairs to enter [employer] of Steps: 6 Entrance Stairs-Rails: Left;Right Home Layout: One level     Bathroom Shower/Tub: Chief Strategy Officer: Standard     Home Equipment: Environmental consultant - 2 wheels;Wheelchair - manual;Walker - standard;Cane - single point;Shower seat;Bedside commode   Additional Comments: Pt reports she normally sponge bathes      Prior Functioning/Environment Level of Independence: Needs assistance  Gait / Transfers Assistance Needed: Uses SPC for household ambulation, sometimes uses wheelchair when LEs are hurting or weak. Pt does not leave house. ADL's / Homemaking Assistance Needed: Sponge bath. Son assists with cooking and cleaning        OT Diagnosis: Generalized weakness   OT Problem List: Decreased strength;Decreased activity tolerance;Impaired balance (sitting and/or standing);Decreased safety awareness;Decreased knowledge of use of DME or AE;Decreased knowledge of precautions;Cardiopulmonary status limiting activity;Obesity   OT Treatment/Interventions: Self-care/ADL training;Therapeutic exercise;Energy conservation;DME and/or AE instruction;Patient/family education;Balance  training    OT Goals(Current goals can be found in the care plan section) Acute Rehab OT Goals Patient Stated Goal: Go home OT Goal Formulation: With patient Time For Goal Achievement: 01/11/16 Potential to Achieve Goals: Fair ADL Goals Pt Will Perform Grooming: with set-up;sitting Pt Will Transfer to Toilet: with min assist;squat pivot transfer;bedside commode Additional ADL Goal #1: Pt will be educated on energy conservation techniques and pt will be able to verbalize at least 3 energy conservation techniques that are pertinent for her care  Additional ADL Goal #2: Pt will be mod I for bed mobility to decrease burden of care  OT Frequency: Min 2X/week   Barriers to D/C: None known at this time   End of Session Equipment Utilized During Treatment: Oxygen  Activity Tolerance: Other (comment) (limited by decreased cardiopulmonary support) Patient left: in bed;with call bell/phone within reach;with bed alarm set   Time: 1349-1404 OT Time Calculation (min): 15 min Charges:  OT General Charges $OT Visit: 1 Procedure OT Evaluation $OT Eval Moderate Complexity: 1 Procedure  Edwin Cap , MS, OTR/L, CLT Pager: [phone]  01/04/2016, 2:17 PM

## 2016-01-04 NOTE — Consult Note (Signed)
   [facility]   01/04/2016  Brenda Wells 12-06-1940 [id number]   [facility] follow up. Chart reviewed. Noted Palliative Medicine notes. Patient now plans to go home with home health rather than to return home with hospice. Noted patient will have home health thru [facility]'s High Risk Initiative program as well. [facility] in the past. However, her plans were home with hospice. Spoke with patient at bedside about potential referral for palliative home based services with [facility].. Explained that [facility] will not interfere or replace services provided by home health but could provide the palliative services that she could benefit from. She is agreeable to a referral to [facility]. In the meantime, we discussed [facility] services if [facility] is unable to follow her for any reason. She is agreeable to [facility] Care Management as well and written consent was obtained. Explained that [facility] Care [facility] will not interfere or replace services with home health but will provide as an additional support as well. Discussed that if [facility] becomes involved, [facility] Care Management will not be needed. She expresses understanding. Explained that she will receive post hospital transition of care calls and will be evaluated for monthly home visits. Patient lives with son. Confirmed Primary Care MD as Dr. Yetta FlockHodges. Confirmed telephone number as [phone]. Brenda's number is [phone]. [facility] packet left at bedside along with contact information.    Call made to inpatient RNCM to discuss [facility] referral and [facility] follow up if [facility] does not follow patient at home.   Call made to [facility]. Spoke with Drenda FreezeFran. Drenda FreezeFran states she will call writer back tomorrow to confirm whether or not patient can be followed by [facility]. Discussed that  patient will have choice on which hospice agency she chooses if she should choose hospice down the road again. Also discussed that patient listed her son, [name], as primary emergency contact at [phone] on [facility] Care [facility] consent.  Will continue to follow and request to be assigned to [facility] RNCM if patient is not going to be followed by [facility].    Raiford NobleAtika Hall, MSN-Ed, RN,BSN [facility] Care Management Hospital Liaison [phone]

## 2016-01-04 NOTE — Progress Notes (Addendum)
Pharmacy Antibiotic Note  Ashlyne G Anger is a 75 y.o. female admHarlow Asaitted on 12/28/2015 with HCAP.  Pharmacy has been consulted for levofloxacin dosing.  Plan: Levofloxacin 750 mg PO daily. Per RN, pt no longer has IV access.  Height: 5\' 5"  (165.1 cm) Weight: 151 lb 14.4 oz (68.9 kg) IBW/kg (Calculated) : 57  Temp (24hrs), Avg:98 F (36.7 C), Min:97.8 F (36.6 C), Max:98.3 F (36.8 C)   Recent Labs Lab 12/29/15 0347 12/30/15 0357 12/31/15 1111 01/01/16 1035 01/02/16 1440 01/03/16 0947 01/04/16 0340  WBC 18.8* 26.3* 27.6* 25.8*  --   --  21.8*  CREATININE 0.82 0.75  --  1.00 0.80 0.88 0.95    Estimated Creatinine Clearance: 50.7 mL/min (by C-G formula based on Cr of 0.95).    No Known Allergies  Antimicrobials this admission: Vancomycin 4/11 >> 4/14 Cefepime 4/11 >> 4/18 Levofloxacin 4/18 >>  Dose adjustments this admission: 4/16: SCr increased - adjusted Cefepime from 1g q8 to 1g q12  Microbiology results: 4/3 BCx: NGF 4/11 BCx: NGF   Thank you for allowing pharmacy to be a part of this patient's care.  Adalberto ColeNikola Aliana Kreischer, PharmD, BCPS Pager [phone] 01/04/2016 4:50 PM

## 2016-01-05 LAB — GLUCOSE, CAPILLARY
GLUCOSE-CAPILLARY: 185 mg/dL — AB (ref 65–99)
GLUCOSE-CAPILLARY: 220 mg/dL — AB (ref 65–99)
Glucose-Capillary: 70 mg/dL (ref 65–99)
Glucose-Capillary: 130 mg/dL — ABNORMAL HIGH (ref 65–99)

## 2016-01-05 MED ORDER — LEVOFLOXACIN 750 MG PO TABS: 750.0000 mg | ORAL_TABLET | Freq: Every day | ORAL | Status: AC

## 2016-01-05 MED ORDER — PREDNISONE 20 MG PO TABS: 10.0000 mg | ORAL_TABLET | Freq: Every day | ORAL | Status: AC

## 2016-01-05 MED ORDER — MORPHINE SULFATE (CONCENTRATE) 10 MG/0.5ML PO SOLN: 5.0000 mg | Freq: Every evening | ORAL | Status: AC

## 2016-01-05 NOTE — Progress Notes (Signed)
Discharge instructions reviewed with son and patient, questions answered, verbalized understanding.  Rxs to son along with DNR form.  AC came up to unit and Advanced Directive was notarized.  Patient assisted by RN and tech to son's car to be taken home.

## 2016-01-05 NOTE — Progress Notes (Signed)
PT Cancellation Note  Patient Details Name: Brenda Wells MRN: [id number] DOB: 1941/08/08   Cancelled Treatment:    Reason Eval/Treat Not Completed: Medical issues which prohibited therapy (per RN Herbert SetaHeather, pt cannot tolerate minimal activity ( such as getting on bedpan) and does not have rehab potential. PT signing off. )   [name], [name] 01/05/2016, 10:40 AM  [phone]

## 2016-01-05 NOTE — Discharge Summary (Addendum)
Physician Discharge Summary  Brenda Wells ZOX:[id number] DOB: 07/09/1941 DOA: 12/28/2015  PCP: [name],[name], MD  Admit date: 12/28/2015 Discharge date: 01/05/2016  Time spent:6945minutes  Recommendations for Outpatient Follow-up:  1. Dr.[name] in 1 week 2. Home health Nursing Daily per [facility]-(High risk Initiative for re-admission program ) which provides more support than Hospice at home which she had prior to admission and they can have Palliative FU with her and transition to full comfort care when appropriate with goal to avoid re-hospitalization 3. [facility]-High RIsk [facility]-Please transition to full comfort care when appropriate   Discharge Diagnoses:    End Stage COPD   Chronic resp failure   Health Care associate pneumonia   COPD exacerbation (HCC)   Acute on chronic respiratory failure with hypoxia (HCC)   Chronic atrial fibrillation (HCC)   DNR (do not resuscitate)   Leukocytosis   Type II diabetes mellitus (HCC)   Acute respiratory failure with hypoxia (HCC)   Encounter for palliative care   Discharge Condition: guarded  Diet recommendation: DM/heart healthy  Filed Weights   12/28/15 1300 12/29/15 0559  Weight: 67.7 kg (149 lb 4 oz) 68.9 kg (151 lb 14.4 oz)    History of present illness:  Brenda Wells is a 75 y.o. female past medical history of severe COPD on 3 L of oxygen at home with ongoing tobacco abuse, chronic atrial fibrillation on anticoagulation, chronic diastolic heart failure, multiple admissions in the last 6 months recently discharged on 12/27/2015 With severe COPD exacerbation/healthcare associated pneumonia hospice and palliative care evaluated the patient at that time and the patient was made DNR/DNI, she went home and came back severe dyspnea at rest progressively getting worse, the patient continues to smoke.  Hospital Course:  Acute on chronic respiratory failure with hypoxia /End Stage COPD/COPD exacerbation in the setting of ongoing tobacco  abuse/HCAP -recurrent admissions for same, treated with IV steroids, antibiotics, nebs -improved with same however continues to still have moderate symptom burden at baseline -O2 weaned back to 3L Baseline O2 -Seen by Palliative medicine and started on Roxanol every evening and PRN -Discussed poor prognosis numerous times, she elects to go back home with Hospice services but since Hospice RN would only be able to visit her once a week, Case management made arrangements for High risk Initiative per [facility] and can have Palliative follow her with goal to transition to full comfort care to avoid re-hospitalization -Despite this is she comes back to the hospital again would need Residential Hospice -being discharged on prednisone taper, Abx, Roxanol  Chronic atrial fibrillation (HCC) Controlled, continue Eluquis.  Leukocytosis: -multifactorial, primarily steroid induced and reactive -trending down from peak of 27K, afebrile   AKI; -resolved   Type II diabetes mellitus (HCC): Continued on her glipizide at home, was started on Levemir and SSI due to hyperglycemia worsened by steroids -im not discharging her on Insulin since steroids will be tapered down in few days to 10mg  daily and focus is mainly symptom management and supportive care at this point   Code Status: DNR  Consultations:  Palliative medicine  Discharge Exam: Filed Vitals:   01/04/16 2131 01/05/16 0545  BP: 124/54 133/53  Pulse: 106 96  Temp: 97.9 F (36.6 C) 97.7 F (36.5 C)  Resp: 20 20    General: AAOx3 Cardiovascular: S1S2/RRR Respiratory: scattered ronchi  Discharge Instructions   Discharge Instructions    Diet - low sodium heart healthy    Complete by:  As directed      Diet - low sodium heart healthy    Complete by:  As directed      Diet Carb Modified    Complete by:  As directed      Increase activity slowly    Complete by:  As directed       Increase activity slowly    Complete by:  As directed      Increase activity slowly    Complete by:  As directed           Current Discharge Medication List    START taking these medications   Details  levofloxacin (LEVAQUIN) 750 MG tablet Take 1 tablet (750 mg total) by mouth daily. For 2days Qty: 2 tablet, Refills: 0    !! Morphine Sulfate (MORPHINE CONCENTRATE) 10 MG/0.5ML SOLN concentrated solution Take 0.25 mLs (5 mg total) by mouth every 4 (four) hours as needed for severe pain or shortness of breath. Qty: 42 mL, Refills: 0    !! Morphine Sulfate (MORPHINE CONCENTRATE) 10 MG/0.5ML SOLN concentrated solution Take 0.25 mLs (5 mg total) by mouth every evening. Qty: 42 mL, Refills: 0     !! - Potential duplicate medications found. Please discuss with provider.    CONTINUE these medications which have CHANGED   Details  ipratropium-albuterol (DUONEB) 0.5-2.5 (3) MG/3ML SOLN Take 3 mLs by nebulization every 6 (six) hours. Qty: 360 mL, Refills: 3    predniSONE (DELTASONE) 20 MG tablet Take 0.5-2 tablets (10-40 mg total) by mouth daily with breakfast. Take  for 2days then  for 2days then back to  daily Qty: 20 tablet, Refills: 0      CONTINUE these medications which have NOT CHANGED   Details  albuterol (PROVENTIL) (2.5 MG/3ML) 0.083% nebulizer solution Take 3 mLs (2.5 mg total) by nebulization every 2 (two) hours as needed for wheezing or shortness of breath. Qty: 75 mL, Refills: 12    apixaban (ELIQUIS) 5 MG TABS tablet Take 1 tablet (5 mg total) by mouth 2 (two) times daily. Qty: 60 tablet, Refills: 2    arformoterol (BROVANA) 15 MCG/2ML NEBU Take 2 mLs (15 mcg total) by nebulization 2 (two) times daily. Qty: 120 mL, Refills: 0    diltiazem (CARDIZEM CD) 240 MG 24 hr capsule Take 2 capsules (480 mg total) by mouth daily. Qty: 60 capsule, Refills: 0    famotidine (PEPCID) 20 MG tablet Take 1 tablet (20 mg total) by mouth at bedtime. Qty: 30 tablet,  Refills: 0    feeding supplement, GLUCERNA SHAKE, (GLUCERNA SHAKE) LIQD Take 237 mLs by mouth 3 (three) times daily between meals. Qty: 90 Can, Refills: 0    furosemide (LASIX) 40 MG tablet Take 1 tablet (40 mg total) by mouth daily. May take additional one tab in pm if increased swelling Qty: 30 tablet, Refills: 0    gabapentin (NEURONTIN) 100 MG capsule Take 1 capsule (100 mg total) by mouth at bedtime. Qty: 30 capsule, Refills: 0    glipiZIDE (GLUCOTROL) 5 MG tablet Take 1 tablet (5 mg total) by mouth daily before breakfast. Qty: 30 tablet, Refills: 0    guaiFENesin (MUCINEX) 600 MG 12 hr tablet Take 1 tablet (600 mg total) by mouth 2 (two) times daily. Qty: 60 tablet, Refills: 0    loratadine (CLARITIN) 10 MG tablet Take 10 mg by mouth daily.    omeprazole (PRILOSEC) 20 MG capsule Take 20 mg by mouth daily.    ondansetron (ZOFRAN) 4 MG tablet Take  4 mg by mouth every 6 (six) hours as needed for nausea or vomiting.  Refills: 0    sennosides-docusate sodium (SENOKOT-S) 8.6-50 MG tablet Take 2 tablets by mouth 2 (two) times daily.    zolpidem (AMBIEN) 5 MG tablet Take 1 tablet (5 mg total) by mouth at bedtime as needed for sleep. Qty: 30 tablet, Refills: 0      STOP taking these medications     Fluticasone-Salmeterol (ADVAIR) 500-50 MCG/DOSE AEPB      doxycycline (VIBRA-TABS) 100 MG tablet      oxyCODONE (OXY IR/ROXICODONE) 5 MG immediate release tablet        No Known Allergies Follow-up Information    Follow up with [facility].   Contact information:   [address] [place] [address] [phone]       Follow up with [name],[name], MD. Schedule an appointment as soon as possible for a visit in 1 week.   Specialty:  Family Medicine       The results of significant diagnostics from this hospitalization (including imaging, microbiology, ancillary and laboratory) are listed below for reference.    Significant Diagnostic  Studies: Dg Chest 2 View  01/01/2016  CLINICAL DATA:  75 year old female with shortness breath and decreased oxygen saturation. Dyspnea. History of COPD. Acute respiratory failure. Community acquired pneumonia. EXAM: CHEST  2 VIEW COMPARISON:  Chest x-ray 12/28/2015. FINDINGS: Small left pleural effusion increased compared to the prior study. Opacity the base of the left hemithorax favored to reflect predominantly subsegmental atelectasis, although underlying airspace consolidation is not entirely excluded. Right lung is clear. No right pleural effusion. No pneumothorax. No definite suspicious appearing pulmonary nodules or masses are noted. Mild diffuse peribronchial cuffing. No evidence of pulmonary edema. Heart size is mildly enlarged. Upper mediastinal contours are within normal limits. Atherosclerosis in the thoracic aorta. IMPRESSION: 1. Slight increase in small left pleural effusion. 2. Opacity at the left lung base, most compatible with atelectasis/scarring in the inferior segment of the lingula as demonstrated on prior CT examination 09/29/2015. 3. Mild cardiomegaly. 4. Atherosclerosis. Electronically Signed   By: Trudie Reed M.D.   On: 01/01/2016 13:20   Dg Chest 2 View  12/28/2015  CLINICAL DATA:  75 year old female with increasing shortness of Breath after discharge from the hospital yesterday. Recent left side Healthcare associated pneumonia with small pleural effusion. Initial encounter. EXAM: CHEST  2 VIEW COMPARISON:  12/20/2015 and earlier. FINDINGS: Semi upright AP and lateral views of the chest. Stable large lung volumes and cardiomegaly. Recurrent superior segment left lower lobe collapse or consolidation, appearing similar to that seen on the chest CTA 09/29/2015. Trace left pleural effusion versus pleural scarring not significantly changed since that time. No other confluent pulmonary opacity. No pneumothorax or pulmonary edema. Visualized tracheal air column is within normal limits.  Osteopenia. No acute osseous abnormality identified. Chronic right lateral sixth rib fracture. Extensive calcified aortic atherosclerosis. IMPRESSION: 1. Recurrent superior segment left upper lobe consolidation or collapse, appearing similar to that on the chest CTA 07/19/2016. 2. Small left pleural effusion versus pleural scarring has not significantly changed since January. 3. Underlying chronic hyperinflation, cardiomegaly, and calcified aortic atherosclerosis. Electronically Signed   By: Odessa Fleming M.D.   On: 12/28/2015 09:26   Dg Chest Port 1 View  12/20/2015  CLINICAL DATA:  Shortness of breath beginning last night. History of COPD and pneumonia, CHF. EXAM: PORTABLE CHEST 1 VIEW COMPARISON:  Chest radiograph November 23, 2015 FINDINGS: The cardiac silhouette is  moderately enlarged unchanged. Mildly calcified aortic knob. Similar chronic interstitial changes increased lung volumes compatible with COPD. New small LEFT pleural effusion and LEFT midlung zone airspace opacity. No pneumothorax. Osteopenia. Soft tissues are normal. IMPRESSION: New small LEFT pleural effusion with LEFT midlung zone airspace opacity. Followup PA and lateral chest X-ray is recommended in 3-4 weeks following trial of antibiotic therapy to ensure resolution and exclude underlying malignancy. Stable cardiomegaly and COPD. Electronically Signed   By: Awilda Metro M.D.   On: 12/20/2015 04:42    Microbiology: Recent Results (from the past 240 hour(s))  Culture, blood (routine x 2) Call MD if unable to obtain prior to antibiotics being given     Status: None   Collection Time: 12/28/15 10:14 AM  Result Value Ref Range Status   Specimen Description BLOOD RIGHT ANTECUBITAL  Final   Special Requests BOTTLES DRAWN AEROBIC ONLY  Final   Culture   Final    NO GROWTH 5 DAYS Performed at [facility]    Report Status 01/02/2016 FINAL  Final  Culture, blood (routine x 2) Call MD if unable to obtain prior to antibiotics being  given     Status: None   Collection Time: 12/28/15 12:10 PM  Result Value Ref Range Status   Specimen Description BLOOD RIGHT ANTECUBITAL  Final   Special Requests BOTTLES DRAWN AEROBIC AND ANAEROBIC  Final   Culture   Final    NO GROWTH 5 DAYS Performed at [facility]    Report Status 01/02/2016 FINAL  Final     Labs: Basic Metabolic Panel:  Recent Labs Lab 12/30/15 0357 01/01/16 1035 01/02/16 1440 01/03/16 0947 01/04/16 0340  NA 139 140 138 136 139  K 3.9 4.3 4.0 4.3 4.6  CL 91* 89* 91* 90* 92*  CO2 36* 34* 34* 34* 38*  GLUCOSE 227* 295* 264* 293* 325*  BUN 35* 43* 43* 48* 52*  CREATININE 0.75 1.00 0.80 0.88 0.95  CALCIUM 8.8* 8.7* 8.4* 8.4* 8.3*   Liver Function Tests: No results for input(s): AST, ALT, ALKPHOS, BILITOT, PROT, ALBUMIN in the last 168 hours. No results for input(s): LIPASE, AMYLASE in the last 168 hours. No results for input(s): AMMONIA in the last 168 hours. CBC:  Recent Labs Lab 12/30/15 0357 12/31/15 1111 01/01/16 1035 01/04/16 0340  WBC 26.3* 27.6* 25.8* 21.8*  HGB 9.0* 9.6* 9.7* 8.6*  HCT 28.8* 30.5* 31.4* 28.6*  MCV 87.0 85.2 85.3 85.6  PLT 307 304 295 194   Cardiac Enzymes: No results for input(s): CKTOTAL, CKMB, CKMBINDEX, TROPONINI in the last 168 hours. BNP: BNP (last 3 results)  Recent Labs  11/21/15 0402 12/20/15 0448 12/28/15 1009  BNP 284.2* 68.6 102.6*    ProBNP (last 3 results) No results for input(s): PROBNP in the last 8760 hours.  CBG:  Recent Labs Lab 01/04/16 1227 01/04/16 1659 01/04/16 2124 01/05/16 0803 01/05/16 1152  GLUCAP 331* 185* 220* 70 130*       Signed:  Sanaa Zilberman MD.  [facility] 01/05/2016, 1:33 PM

## 2016-01-05 NOTE — Progress Notes (Signed)
OT Cancellation Note  Patient Details Name: Brenda Wells MRN: [id number] DOB: 1940/11/15   Cancelled Treatment:    Reason Eval/Treat Not Completed: Other (comment)  Reason Eval/Treat Not Completed: Medical issues which prohibited therapy (per RN Herbert SetaHeather, pt cannot tolerate minimal activity ( such as getting on bedpan) and does not have rehab potential. OT signing off.   [name], Telisa Ohlsen D 01/05/2016, 11:28 AM

## 2016-01-06 DIAGNOSIS — J440 Chronic obstructive pulmonary disease with acute lower respiratory infection: Secondary | ICD-10-CM | POA: Diagnosis not present

## 2016-01-06 NOTE — Patient Outreach (Signed)
   Telephone call made to [facility] to find out the status on whether they will be able to enroll patient for services. Spoke with Drenda FreezeFran. Ms. Brenda Wells's case is currently being reviewed by their Medical Director. Asked Drenda FreezeFran to please Regulatory affairs officercall writer with decision. In the meantime, [facility] referral made to [facility] for follow up. Patient also has home with [facility] with their High Risk Initiative. Will make [facility] RNCM aware of status with [facility].  Brenda NobleAtika Shaqueena Mauceri, MSN-Ed, RN,BSN [facility] Care Management Hospital Liaison [phone]

## 2016-01-07 ENCOUNTER — Other Ambulatory Visit: Payer: Self-pay | Admitting: *Deleted

## 2016-01-07 ENCOUNTER — Other Ambulatory Visit: Payer: Self-pay

## 2016-01-07 NOTE — Patient Outreach (Signed)
Received call from [facility] RNCM to discuss whether [facility] has made a decision on following patient or not.  Made her aware that writer has not heard from [facility] today. Geologist, engineeringYesterday writer was informed by [facility] that Ms. Brenda Wells's case was being reviewed by the MD Director of [facility]. Writer called again to [facility]. Spoke with Drenda FreezeFran. Case is being discussed with Medical Director and nursing currently. Drenda FreezeFran will notify writer once decision has been made. Will await response from [facility] and pass information to [facility] RNCM.   Raiford NobleAtika Hall, MSN-Ed, RN,BSN [facility] Care Management Hospital Liaison [phone]

## 2016-01-07 NOTE — Patient Outreach (Signed)
Care coordination: New referral sent on  01/05/2016. [facility]. At discharge patient accepted referral to [facility] with  [facility], [facility].  Placed call to [facility] today and left a message requesting a call back with update of referral status. Placed call to MD and discovered patient cancelled hospital follow up that was scheduled for 01/06/2016. Placed call to Raiford NobleAtika Hall to provide update.  Patient is pending at this time. Gerre Sculltika is still waiting update. [facility] who reports that patient was discharged on April 7th. Placed call Gerre Sculltika and provided update.  PLAN: Will await a call back from [place] about [facility].  Rowe PavyAmanda Jovann Luse, RN, BSN, CEN [facility] Care Coordinator [phone]

## 2016-01-07 NOTE — Patient Outreach (Signed)
  Telephone call received from [place] with [facility]. Drenda FreezeFran states patient can be followed by [facility]. [facility] to reach out to patient/family to engage for services. Made Drenda FreezeFran aware to please refer back to [facility] Care Management if for some reason patient does not engage with them. Drenda FreezeFran agreeable to [facility] contacting [facility] Care [facility] if needed.   Made [facility] Community RNCM, Marchelle Folksmanda, aware that [facility] Care [facility] to not follow while [facility] will be involved.    Will alert [facility] Care [facility] office that [facility] will follow.   Raiford NobleAtika Hall, MSN-Ed, RN,BSN [facility] Care Management Hospital Liaison [phone]

## 2016-01-10 NOTE — Patient Outreach (Signed)
[facility]) Care Management  01/10/2016  Harlow Asadna G Vollman [birth year] [id number]   01/07/2016: Message received from [facility] Hall RN  Hospital Liasion. Patient was accepted by  [facility] and to close case. Raiford NobleAtika Hall to send case closure.  Rowe PavyAmanda Mykayla Brinton, RN, BSN, CEN [facility] Care Coordinator [phone]

## 2016-01-15 DIAGNOSIS — R825 Elevated urine levels of drugs, medicaments and biological substances: Secondary | ICD-10-CM | POA: Diagnosis not present

## 2016-01-16 DIAGNOSIS — N390 Urinary tract infection, site not specified: Secondary | ICD-10-CM | POA: Diagnosis not present

## 2016-01-17 DIAGNOSIS — J449 Chronic obstructive pulmonary disease, unspecified: Secondary | ICD-10-CM | POA: Diagnosis not present

## 2016-02-17 DEATH — deceased

## 2016-09-03 IMAGING — CT CT ANGIO CHEST
2 of 9 series · 19 of 46 positions shown · IV contrast (OMNI)
Comparison: CT chest 07/22/2015.  PA and lateral chest 09/28/2015.

CLINICAL DATA: Patient with a DVT in the right lower extremity.
History of COPD. Worsening shortness of breath. Initial encounter.

EXAM:
CT ANGIOGRAPHY CHEST WITH CONTRAST
TECHNIQUE: Multidetector CT imaging of the chest was performed using the
standard protocol during bolus administration of intravenous
contrast. Multiplanar CT image reconstructions and MIPs were
obtained to evaluate the vascular anatomy.
CONTRAST:  80 mL OMNIPAQUE IOHEXOL 350 MG/ML SOLN

[Series 10: thins · axial · 0.74mm/px · z∈[+1141,+1402]mm · 16 of 295 slices shown]
[im 17/295  lung]
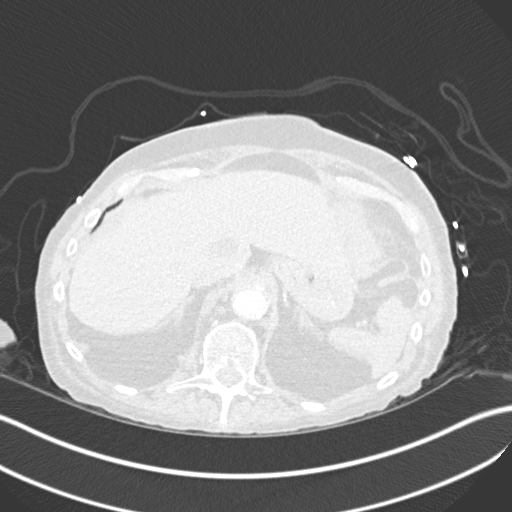
[im 33/295  soft-tissue]
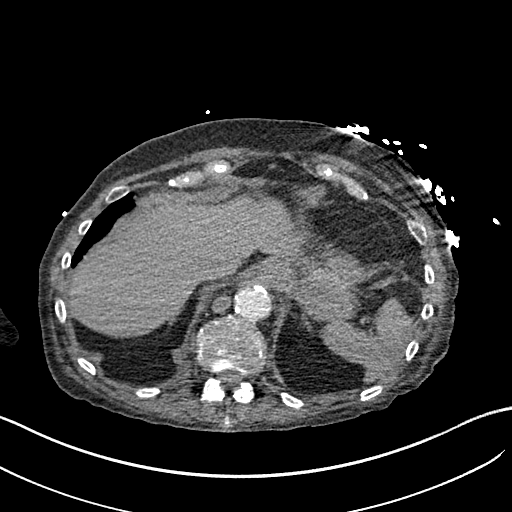
[im 50/295  lung]
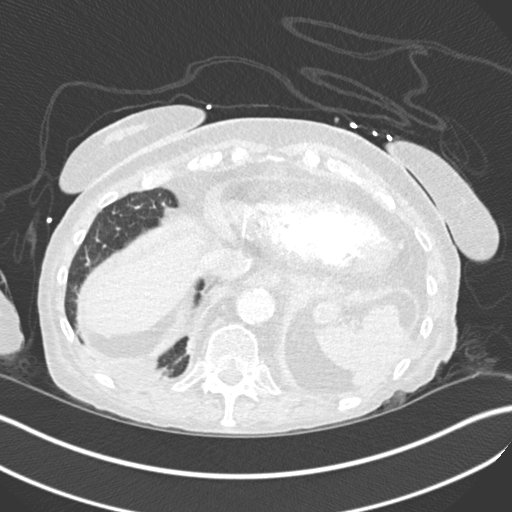
[im 66/295  soft-tissue]
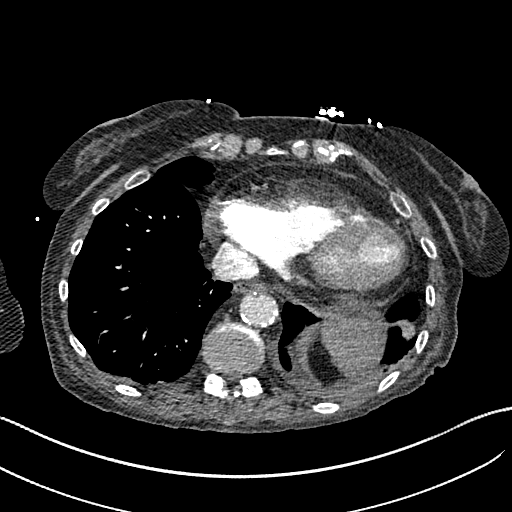
[im 82/295  lung]
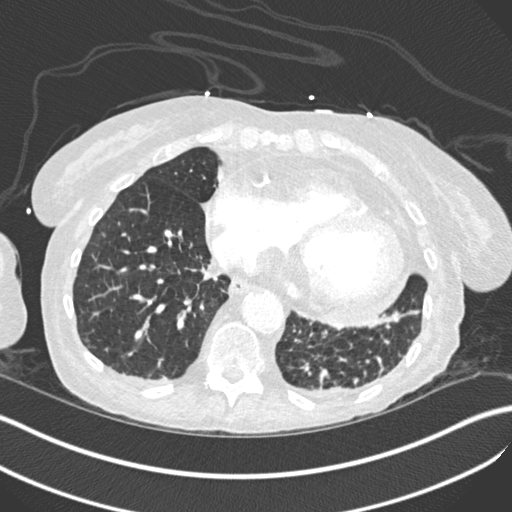
[im 99/295  soft-tissue]
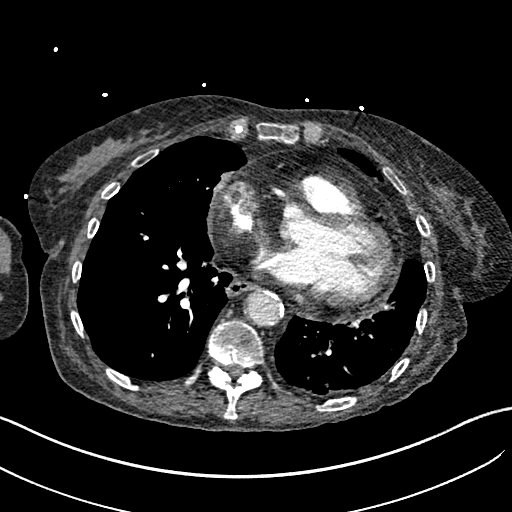
[im 115/295  lung]
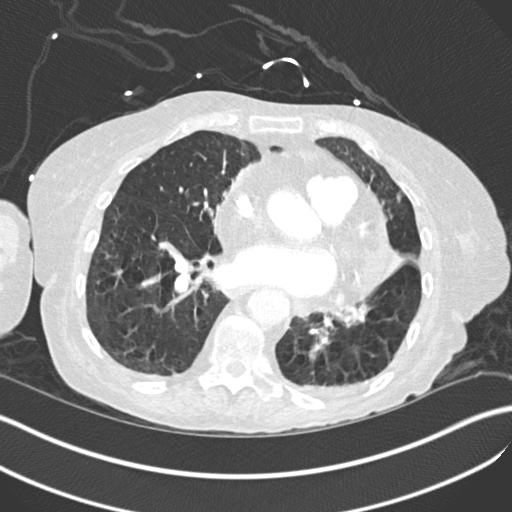
[im 131/295  soft-tissue]
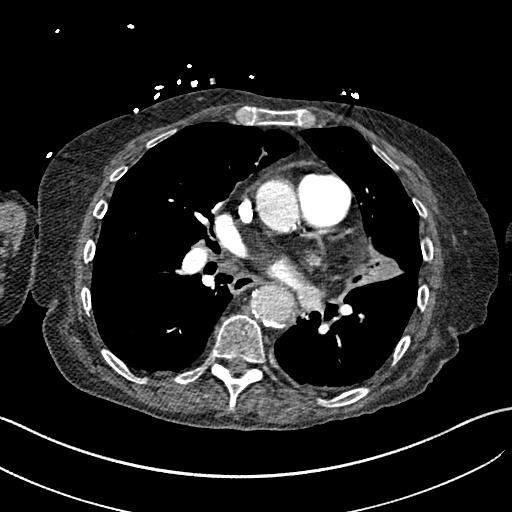
[im 164/295  lung]
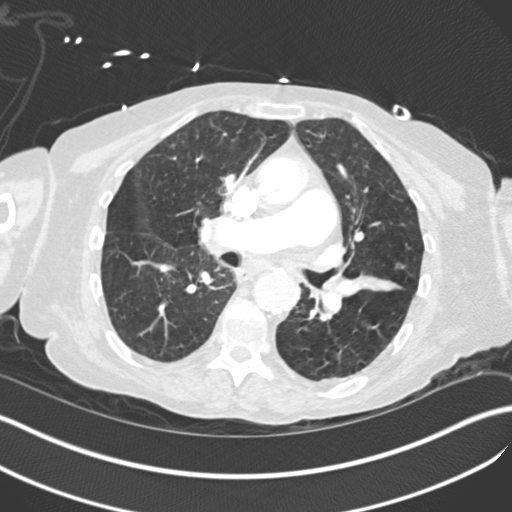
[im 180/295  soft-tissue]
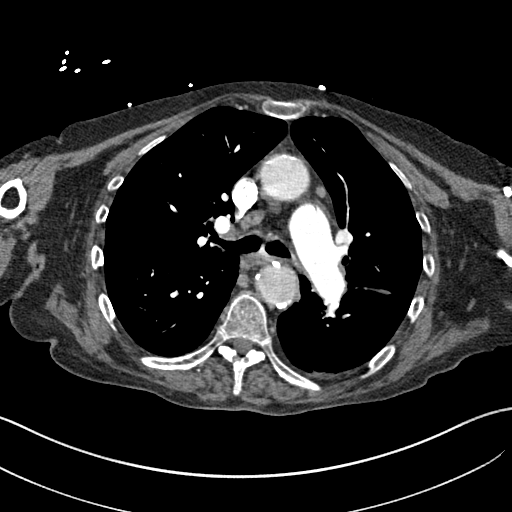
[im 197/295  lung]
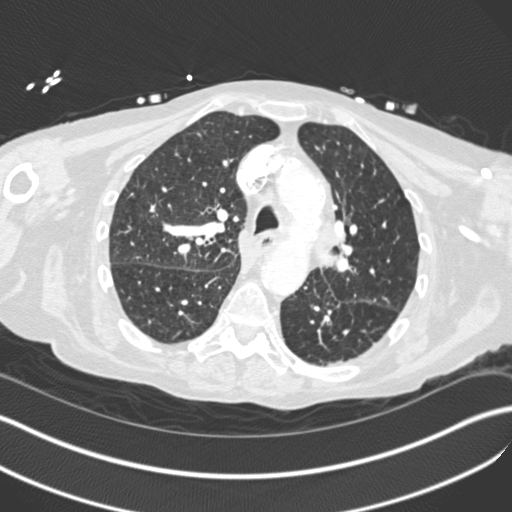
[im 213/295  soft-tissue]
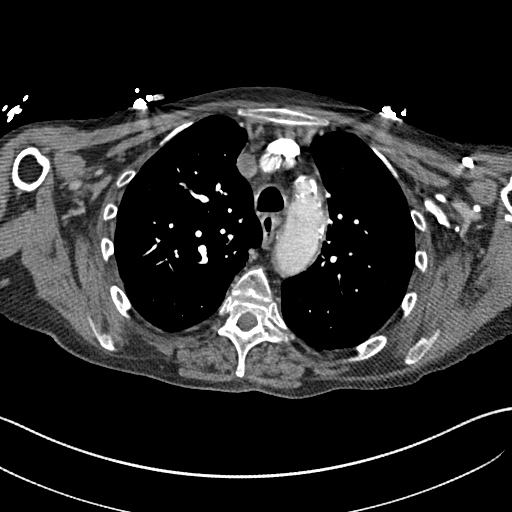
[im 229/295  lung]
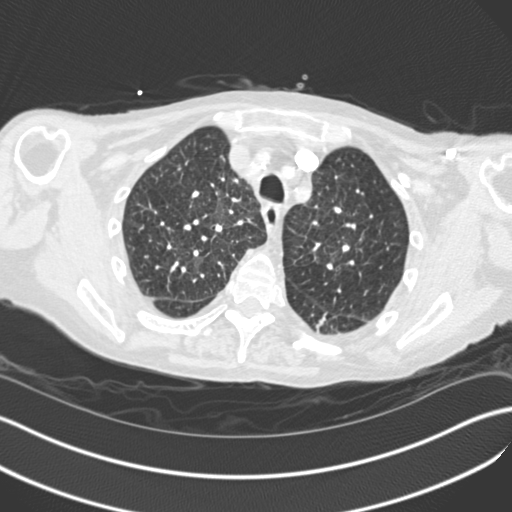
[im 245/295  soft-tissue]
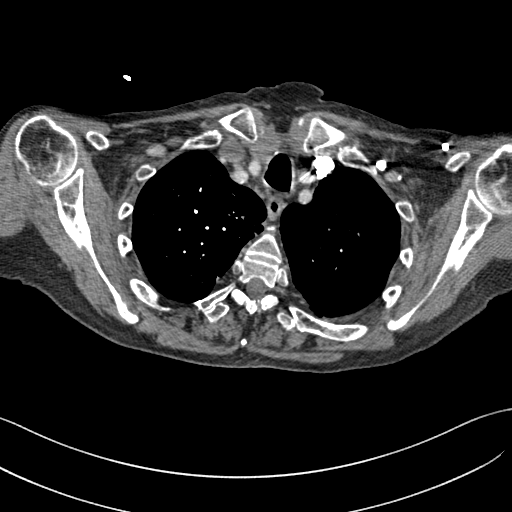
[im 262/295  lung]
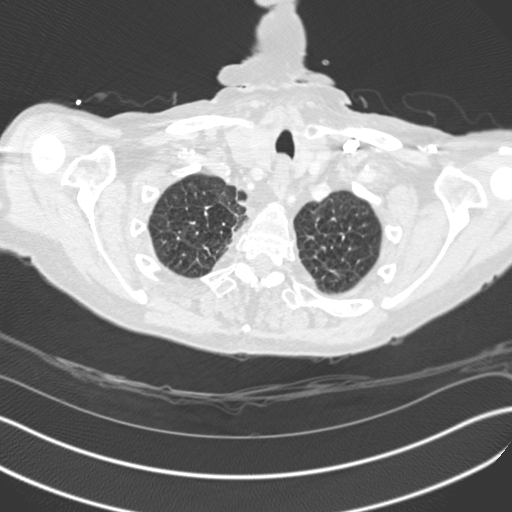
[im 278/295  soft-tissue]
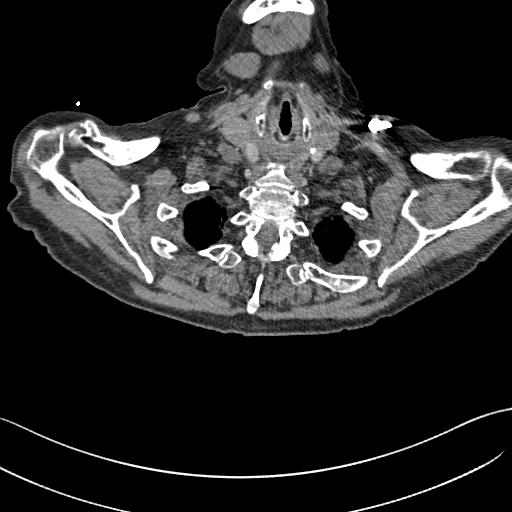

[Series 603: cor mpr · coronal · 0.74mm/px · 3 of 77 slices shown]
[im 20/77  soft-tissue]
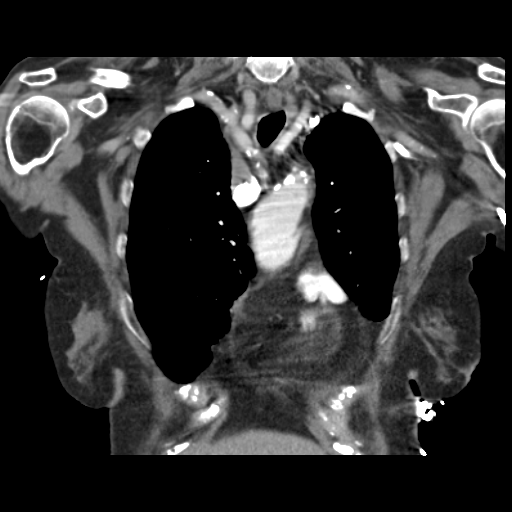
[im 39/77  soft-tissue]
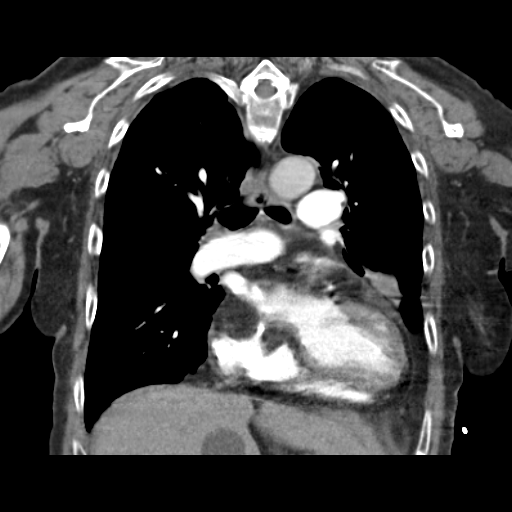
[im 58/77  soft-tissue]
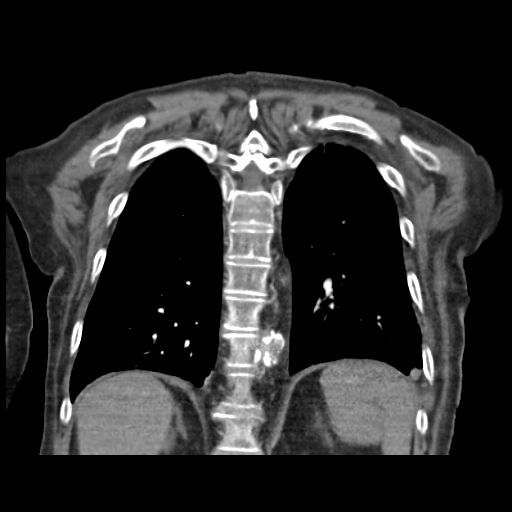

[19 of 46 positions shown; findings below may reference images not displayed]

FINDINGS: No pulmonary embolus is identified. Extensive aortic and coronary
atherosclerosis is seen. No pathologic lymphadenopathy by CT size
criteria in the axilla, hila or mediastinum is identified. Very
small bilateral pleural effusions are seen and decreased since the
prior CT. Heart size is mildly enlarged.

The lungs demonstrate extensive emphysematous change. Chronic
atelectasis in the left lower lobe is unchanged. Volume loss in the
right middle lobe seen on the prior CT has resolved. The lungs are
otherwise clear.

Visualized upper abdomen shows an unchanged low-attenuation lesion
in the caudate lobe of the liver most consistent with a cyst. No
focal bony abnormality is identified.

Review of the MIP images confirms the above findings.
IMPRESSION: Negative for pulmonary embolus or acute disease.

Trace bilateral pleural effusions are decreased since the prior CT.

Emphysema.

Mild cardiomegaly.

Calcific aortic and coronary atherosclerosis.

## 2016-09-24 IMAGING — CR DG CHEST 2V
2 series · 2 of 2 positions shown · non-contrast
Comparison: 10/01/2015

CLINICAL DATA: Cough and congestion

EXAM:
CHEST  2 VIEW

[chest pa]
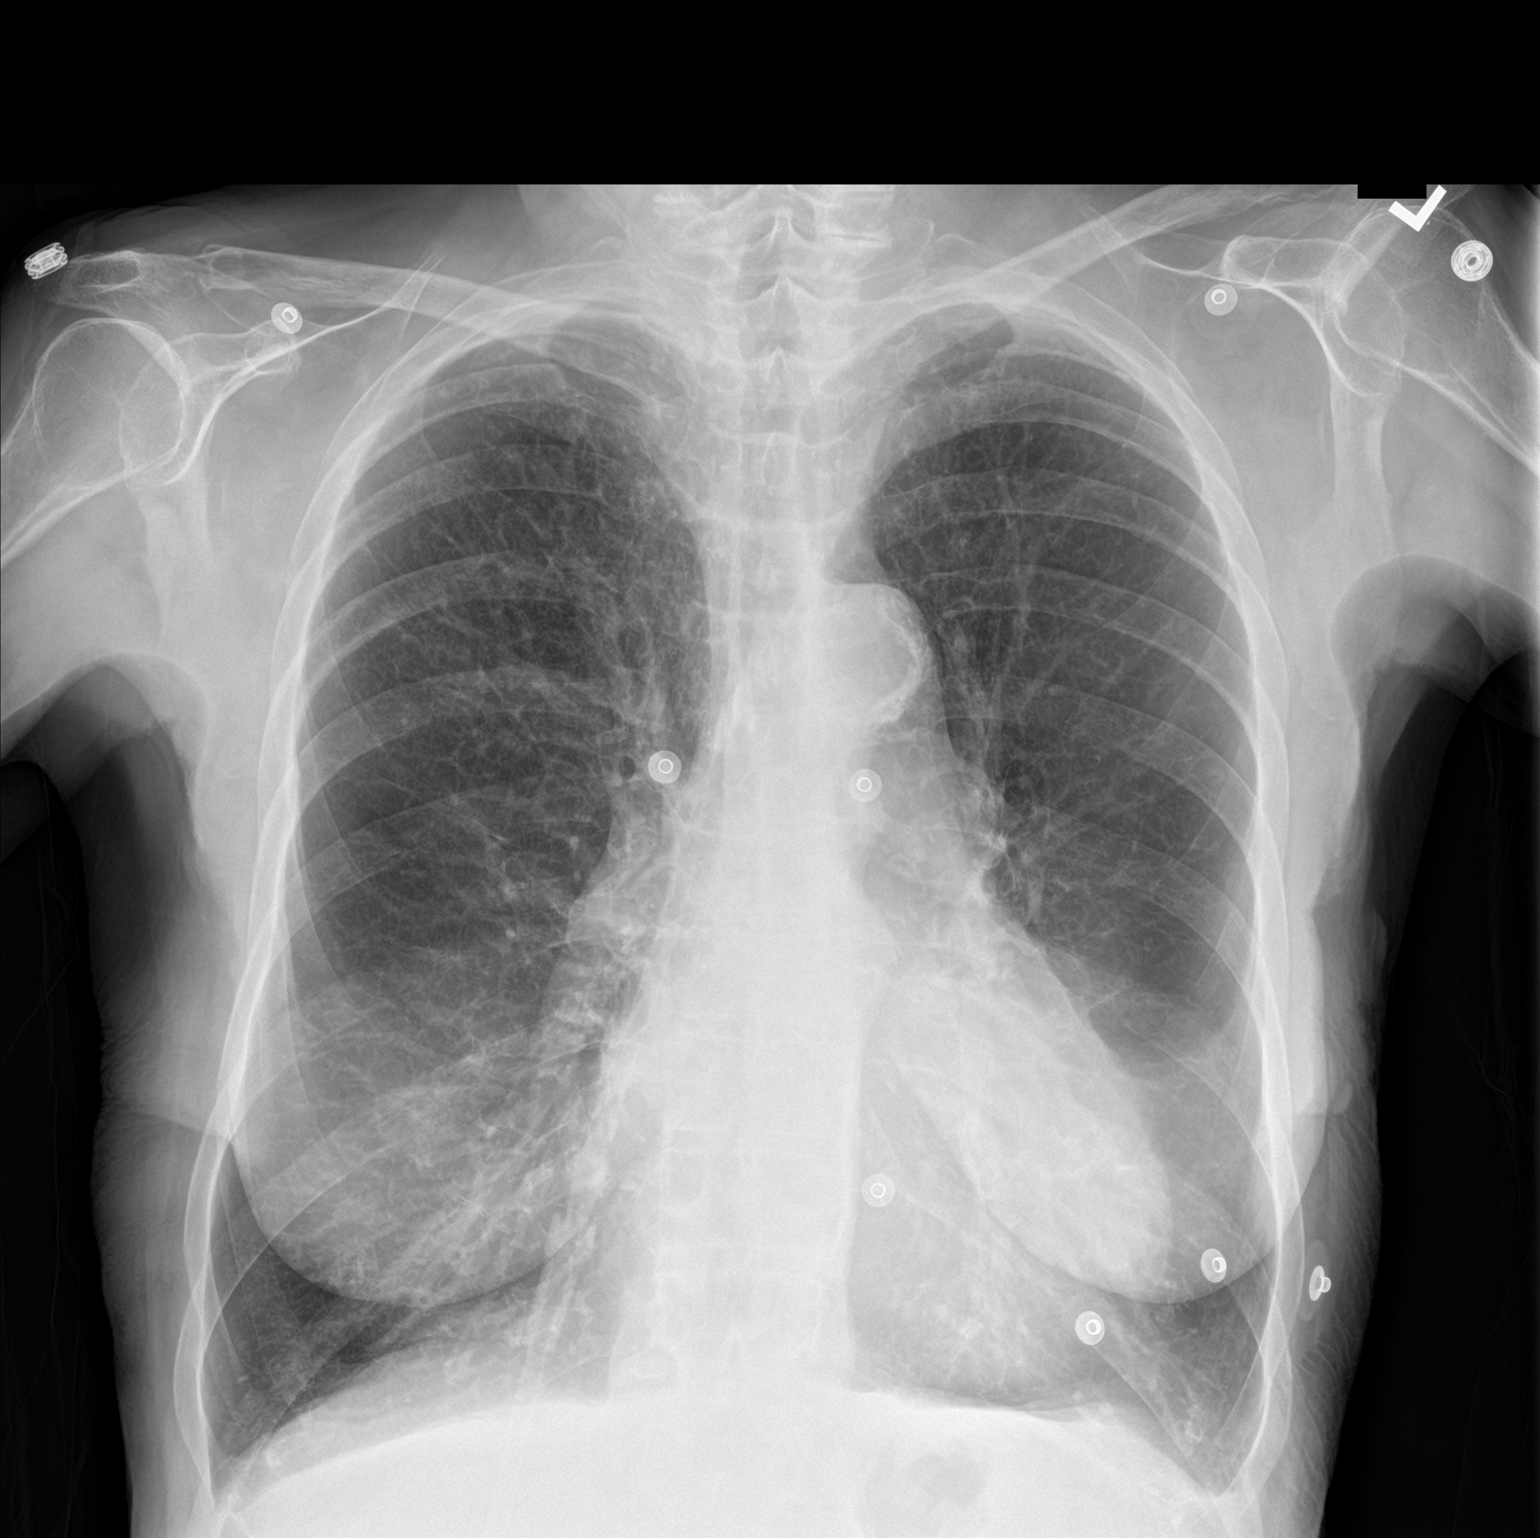

[chest lat]
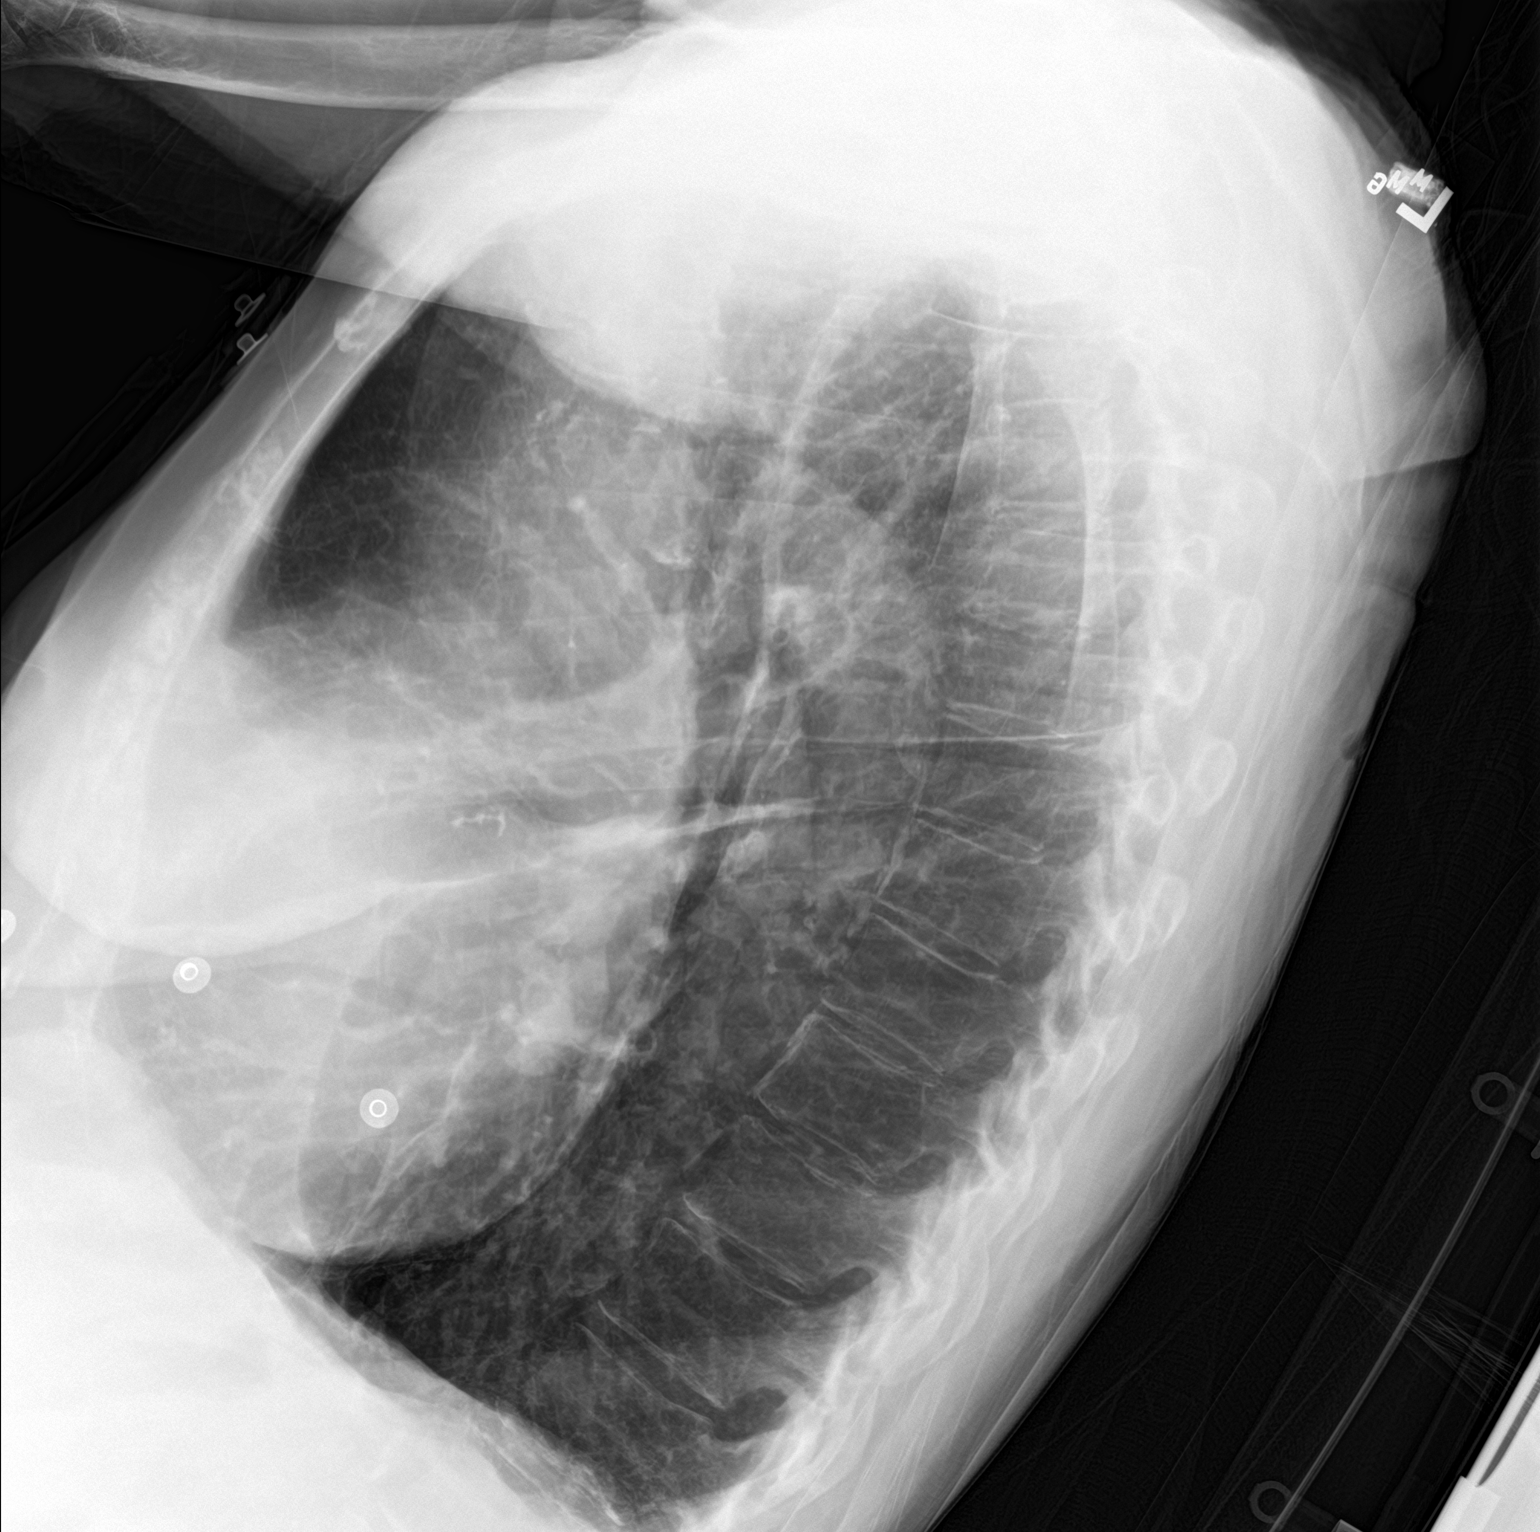

[2 of 2 positions shown; findings below may reference images not displayed]

FINDINGS: The heart size appears mildly enlarged. Aortic atherosclerosis
identified. There is no pleural effusion or edema identified. The
lungs are hyperinflated but clear. Coarsened interstitial markings
suggestive of COPD noted.
IMPRESSION: 1. Suspect emphysema
2. Mild cardiac enlargement and aortic atherosclerosis.
